# Patient Record
Sex: Male | Born: 1985 | Race: Black or African American | Hispanic: No | Marital: Married | Smoking: Current every day smoker
Health system: Southern US, Community
[De-identification: ages and names within clinical notes are randomized; demographics above are authoritative.]

## PROBLEM LIST (undated history)

## (undated) DIAGNOSIS — N400 Enlarged prostate without lower urinary tract symptoms: Secondary | ICD-10-CM

## (undated) DIAGNOSIS — K259 Gastric ulcer, unspecified as acute or chronic, without hemorrhage or perforation: Secondary | ICD-10-CM

## (undated) DIAGNOSIS — J40 Bronchitis, not specified as acute or chronic: Secondary | ICD-10-CM

## (undated) DIAGNOSIS — K219 Gastro-esophageal reflux disease without esophagitis: Secondary | ICD-10-CM

## (undated) DIAGNOSIS — R011 Cardiac murmur, unspecified: Secondary | ICD-10-CM

## (undated) HISTORY — DX: Benign prostatic hyperplasia without lower urinary tract symptoms: N40.0

## (undated) HISTORY — PX: COLONOSCOPY: SHX174

## (undated) HISTORY — PX: NO PAST SURGERIES: SHX2092

---
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
## 2006-11-28 ENCOUNTER — Emergency Department (HOSPITAL_COMMUNITY): Admission: EM | Admit: 2006-11-28 | Discharge: 2006-11-29 | Payer: Self-pay | Admitting: Emergency Medicine

## 2008-02-09 ENCOUNTER — Emergency Department (HOSPITAL_COMMUNITY): Admission: EM | Admit: 2008-02-09 | Discharge: 2008-02-09 | Payer: Self-pay | Admitting: Emergency Medicine

## 2009-06-21 ENCOUNTER — Emergency Department (HOSPITAL_COMMUNITY): Admission: EM | Admit: 2009-06-21 | Discharge: 2009-06-21 | Payer: Self-pay | Admitting: Emergency Medicine

## 2009-10-10 ENCOUNTER — Emergency Department (HOSPITAL_COMMUNITY): Admission: EM | Admit: 2009-10-10 | Discharge: 2009-10-10 | Payer: Self-pay | Admitting: Family Medicine

## 2009-10-10 ENCOUNTER — Emergency Department (HOSPITAL_COMMUNITY): Admission: EM | Admit: 2009-10-10 | Discharge: 2009-10-10 | Payer: Self-pay | Admitting: Emergency Medicine

## 2010-07-29 LAB — URINALYSIS, ROUTINE W REFLEX MICROSCOPIC
Glucose, UA: NEGATIVE mg/dL
Hgb urine dipstick: NEGATIVE
Ketones, ur: 15 mg/dL — AB
Nitrite: NEGATIVE
Protein, ur: NEGATIVE mg/dL
Specific Gravity, Urine: 1.023 (ref 1.005–1.030)
Urobilinogen, UA: 0.2 mg/dL (ref 0.0–1.0)
pH: 5 (ref 5.0–8.0)

## 2010-07-29 LAB — COMPREHENSIVE METABOLIC PANEL
ALT: 29 U/L (ref 0–53)
AST: 61 U/L — ABNORMAL HIGH (ref 0–37)
Albumin: 4.6 g/dL (ref 3.5–5.2)
Alkaline Phosphatase: 89 U/L (ref 39–117)
BUN: 7 mg/dL (ref 6–23)
CO2: 28 mEq/L (ref 19–32)
Calcium: 9.7 mg/dL (ref 8.4–10.5)
Chloride: 104 mEq/L (ref 96–112)
Creatinine, Ser: 1.2 mg/dL (ref 0.4–1.5)
GFR calc Af Amer: >60 mL/min (ref >60)
GFR calc non Af Amer: >60 mL/min (ref >60)
Glucose, Bld: 82 mg/dL (ref 70–99)
Potassium: 3.7 mEq/L (ref 3.5–5.1)
Sodium: 140 mEq/L (ref 135–145)
Total Bilirubin: 1.5 mg/dL — ABNORMAL HIGH (ref 0.3–1.2)
Total Protein: 7.9 g/dL (ref 6.0–8.3)

## 2010-07-29 LAB — URINE MICROSCOPIC-ADD ON

## 2010-07-29 LAB — CBC
HCT: 45.5 % (ref 39.0–52.0)
Hemoglobin: 15.6 g/dL (ref 13.0–17.0)
MCHC: 34.4 g/dL (ref 30.0–36.0)
MCV: 95.4 fL (ref 78.0–100.0)
Platelets: 264 10*3/uL (ref 150–400)
RBC: 4.77 MIL/uL (ref 4.22–5.81)
RDW: 14.5 % (ref 11.5–15.5)
WBC: 10 10*3/uL (ref 4.0–10.5)

## 2010-07-29 LAB — LIPASE, BLOOD: Lipase: 20 U/L (ref 11–59)

## 2011-04-07 ENCOUNTER — Emergency Department (HOSPITAL_COMMUNITY)
Admission: EM | Admit: 2011-04-07 | Discharge: 2011-04-07 | Disposition: A | Discharge status: Home / Self Care | Payer: Self-pay

## 2011-04-07 NOTE — ED Notes (Signed)
Pain, cramping in bilat legs with "black spots"

## 2011-10-14 ENCOUNTER — Encounter (HOSPITAL_COMMUNITY): Payer: Self-pay | Admitting: *Deleted

## 2011-10-14 ENCOUNTER — Emergency Department (HOSPITAL_COMMUNITY): Payer: Self-pay

## 2011-10-14 ENCOUNTER — Emergency Department (HOSPITAL_COMMUNITY)
Admission: EM | Admit: 2011-10-14 | Discharge: 2011-10-15 | Disposition: A | Discharge status: Home / Self Care | Payer: Self-pay | Attending: Emergency Medicine | Admitting: Emergency Medicine

## 2011-10-14 DIAGNOSIS — M79609 Pain in unspecified limb: Secondary | ICD-10-CM | POA: Insufficient documentation

## 2011-10-14 DIAGNOSIS — M79641 Pain in right hand: Secondary | ICD-10-CM

## 2011-10-14 DIAGNOSIS — Z1881 Retained glass fragments: Secondary | ICD-10-CM | POA: Insufficient documentation

## 2011-10-14 DIAGNOSIS — F17200 Nicotine dependence, unspecified, uncomplicated: Secondary | ICD-10-CM | POA: Insufficient documentation

## 2011-10-14 DIAGNOSIS — Z189 Retained foreign body fragments, unspecified material: Secondary | ICD-10-CM

## 2011-10-14 DIAGNOSIS — M795 Residual foreign body in soft tissue: Secondary | ICD-10-CM | POA: Insufficient documentation

## 2011-10-14 NOTE — ED Notes (Signed)
The pt thinks he may have glass in his rt hand from an accident 2010.  He has knots across his knuckles

## 2011-10-15 ENCOUNTER — Encounter (HOSPITAL_COMMUNITY): Payer: Self-pay | Admitting: Pharmacy Technician

## 2011-10-15 MED ORDER — IBUPROFEN 800 MG PO TABS
800.0000 mg | ORAL_TABLET | Freq: Once | ORAL | Status: AC
  Administered 2011-10-15: 800 mg via ORAL
  Filled 2011-10-15: qty 1

## 2011-10-15 MED ORDER — HYDROCODONE-ACETAMINOPHEN 5-500 MG PO TABS: 1.0000 | ORAL_TABLET | Freq: Four times a day (QID) | ORAL | Status: AC | PRN

## 2011-10-15 MED ORDER — HYDROCODONE-ACETAMINOPHEN 5-325 MG PO TABS
1.0000 | ORAL_TABLET | Freq: Once | ORAL | Status: AC
  Administered 2011-10-15: 1 via ORAL
  Filled 2011-10-15: qty 1

## 2011-10-15 MED ORDER — IBUPROFEN 800 MG PO TABS: 800.0000 mg | ORAL_TABLET | Freq: Three times a day (TID) | ORAL | Status: AC

## 2011-10-15 NOTE — ED Provider Notes (Signed)
History     CSN: [id number]  Arrival date & time 10/14/11  2256   First MD Initiated Contact with Patient 10/15/11 [phone]      Chief Complaint  Patient presents with  . glass in hand from 2010     (Consider location/radiation/quality/duration/timing/severity/associated sxs/prior treatment) HPI 26 yo male presents to the ER c/o pain to 2nd MCP joint and medial 5th MCP joint, concerned about retained glass.  Pt punched a window in 2011, at that time was told he most likely had retained glass and told to f/u with hand surgery.  Pt reports over the last few days he has had increasing pain when making a fist.  He has not followed up with hand.  No drainage, no redness, no swelling.   History reviewed. No pertinent past medical history.  History reviewed. No pertinent past surgical history.  No family history on file.  History  Substance Use Topics  . Smoking status: Current Everyday Smoker  . Smokeless tobacco: Not on file  . Alcohol Use: Yes      Review of Systems  All other systems reviewed and are negative.    Allergies  Review of patient's allergies indicates no known allergies.  Home Medications  No current outpatient prescriptions on file.  BP 121/84  Pulse 74  Temp(Src) 98.2 F (36.8 C) (Oral)  Resp 16  SpO2 100%  Physical Exam  Nursing note and vitals reviewed. Musculoskeletal: Normal range of motion. He exhibits tenderness (tenderness over 2nd mcp and interspace of 4th/5th fingers).       Mild effusion over 2nd (middle) MCP joint.  Scarring noted to interspace of 4th/5th fingers, do not appreciate a fob.    ED Course  Procedures (including critical care time)  Labs Reviewed - No data to display Dg Hand Complete Right  10/14/2011  *RADIOLOGY REPORT*  Clinical Data: Pain.  Laceration.  RIGHT HAND - COMPLETE 3+ VIEW  Comparison: 06/21/2009.  Findings: The joint spaces are maintained.  No acute fracture. There are small radiopaque foreign bodies noted in the  webspace between the fourth and fifth proximal phalanges. These are likely glass fragments. No change since prior study.  IMPRESSION:  1.  Small radiopaque foreign bodies between the fourth and fifth proximal phalanges. 2.  No acute fracture.  Original Report Authenticated By: P. Loralie Champagne, M.D.     No diagnosis found.    MDM  Will treat pain, have patient f/u with ortho/hand.  Given the length of time glass has been in place, expect it to have encapsulated scar tissue, and would be difficult to excise in the ED.        Olivia Mackie, MD 10/15/11 [phone]

## 2011-10-15 NOTE — Discharge Instructions (Signed)
Please take medications as prescribed.  Follow up with the hand surgeon for further workup of your hand pain and the retained glass.

## 2011-10-15 NOTE — ED Notes (Signed)
Received pt. From triage, pt. Alert and oriented, ambulatory, c/o right hand injury in 2010, pt. Reports glass in hand, increased pain last several days

## 2011-10-16 ENCOUNTER — Other Ambulatory Visit: Payer: Self-pay | Admitting: Orthopedic Surgery

## 2011-10-17 ENCOUNTER — Encounter (HOSPITAL_COMMUNITY): Payer: Self-pay | Admitting: *Deleted

## 2011-10-17 MED ORDER — CEFAZOLIN SODIUM 1-5 GM-% IV SOLN
1.0000 g | INTRAVENOUS | Status: AC
  Administered 2011-10-18: 1 g via INTRAVENOUS
  Filled 2011-10-17: qty 50

## 2011-10-18 ENCOUNTER — Encounter (HOSPITAL_COMMUNITY)
Admission: RE | Disposition: A | Discharge status: Home / Self Care | Payer: Self-pay | Source: Ambulatory Visit | Attending: Orthopedic Surgery

## 2011-10-18 ENCOUNTER — Encounter (HOSPITAL_COMMUNITY): Payer: Self-pay | Admitting: *Deleted

## 2011-10-18 ENCOUNTER — Encounter (HOSPITAL_COMMUNITY): Payer: Self-pay | Admitting: Critical Care Medicine

## 2011-10-18 ENCOUNTER — Ambulatory Visit (HOSPITAL_COMMUNITY): Payer: Self-pay | Admitting: Critical Care Medicine

## 2011-10-18 ENCOUNTER — Ambulatory Visit (HOSPITAL_COMMUNITY)
Admission: RE | Admit: 2011-10-18 | Discharge: 2011-10-18 | Disposition: A | Discharge status: Home / Self Care | Payer: Self-pay | Source: Ambulatory Visit | Attending: Orthopedic Surgery | Admitting: Orthopedic Surgery

## 2011-10-18 DIAGNOSIS — M795 Residual foreign body in soft tissue: Secondary | ICD-10-CM | POA: Insufficient documentation

## 2011-10-18 DIAGNOSIS — Z1881 Retained glass fragments: Secondary | ICD-10-CM | POA: Insufficient documentation

## 2011-10-18 DIAGNOSIS — F17200 Nicotine dependence, unspecified, uncomplicated: Secondary | ICD-10-CM | POA: Insufficient documentation

## 2011-10-18 DIAGNOSIS — S60551A Superficial foreign body of right hand, initial encounter: Secondary | ICD-10-CM

## 2011-10-18 DIAGNOSIS — M65839 Other synovitis and tenosynovitis, unspecified forearm: Secondary | ICD-10-CM | POA: Insufficient documentation

## 2011-10-18 HISTORY — DX: Bronchitis, not specified as acute or chronic: J40

## 2011-10-18 HISTORY — PX: I & D EXTREMITY: SHX5045

## 2011-10-18 LAB — COMPREHENSIVE METABOLIC PANEL
ALT: 17 U/L (ref 0–53)
Albumin: 3.8 g/dL (ref 3.5–5.2)
Alkaline Phosphatase: 66 U/L (ref 39–117)
BUN: 13 mg/dL (ref 6–23)
Chloride: 102 mEq/L (ref 96–112)
Potassium: 4 mEq/L (ref 3.5–5.1)
Sodium: 138 mEq/L (ref 135–145)
Total Bilirubin: 0.5 mg/dL (ref 0.3–1.2)

## 2011-10-18 LAB — SURGICAL PCR SCREEN: Staphylococcus aureus: POSITIVE — AB

## 2011-10-18 LAB — CBC
HCT: 40.7 % (ref 39.0–52.0)
Hemoglobin: 14.2 g/dL (ref 13.0–17.0)
RDW: 13.4 % (ref 11.5–15.5)
WBC: 4.9 10*3/uL (ref 4.0–10.5)

## 2011-10-18 SURGERY — IRRIGATION AND DEBRIDEMENT EXTREMITY
Anesthesia: General | Site: Hand | Laterality: Right | Wound class: Contaminated

## 2011-10-18 MED ORDER — SODIUM CHLORIDE 0.45 % IV SOLN: INTRAVENOUS | Status: DC

## 2011-10-18 MED ORDER — CEPHALEXIN 500 MG PO CAPS: 500.0000 mg | ORAL_CAPSULE | Freq: Four times a day (QID) | ORAL | Status: AC

## 2011-10-18 MED ORDER — HYDROMORPHONE HCL PF 1 MG/ML IJ SOLN: 0.2500 mg | INTRAMUSCULAR | Status: DC | PRN

## 2011-10-18 MED ORDER — MIDAZOLAM HCL 5 MG/5ML IJ SOLN
INTRAMUSCULAR | Status: DC | PRN
  Administered 2011-10-18: 2 mg via INTRAVENOUS

## 2011-10-18 MED ORDER — LACTATED RINGERS IV SOLN
INTRAVENOUS | Status: DC | PRN
  Administered 2011-10-18: 08:00:00 via INTRAVENOUS

## 2011-10-18 MED ORDER — SODIUM CHLORIDE 0.9 % IR SOLN
Status: DC | PRN
  Administered 2011-10-18: 1000 mL

## 2011-10-18 MED ORDER — SUFENTANIL CITRATE 50 MCG/ML IV SOLN
INTRAVENOUS | Status: DC | PRN
  Administered 2011-10-18: 10 ug via INTRAVENOUS

## 2011-10-18 MED ORDER — BUPIVACAINE HCL (PF) 0.25 % IJ SOLN
INTRAMUSCULAR | Status: DC | PRN
  Administered 2011-10-18: 8 mL

## 2011-10-18 MED ORDER — ONDANSETRON HCL 4 MG/2ML IJ SOLN
INTRAMUSCULAR | Status: AC
  Filled 2011-10-18: qty 2

## 2011-10-18 MED ORDER — GLYCOPYRROLATE 0.2 MG/ML IJ SOLN
INTRAMUSCULAR | Status: DC | PRN
  Administered 2011-10-18: 0.2 mg via INTRAVENOUS

## 2011-10-18 MED ORDER — MUPIROCIN 2 % EX OINT
TOPICAL_OINTMENT | Freq: Two times a day (BID) | CUTANEOUS | Status: DC
  Filled 2011-10-18 (×2): qty 22

## 2011-10-18 MED ORDER — LIDOCAINE HCL (CARDIAC) 20 MG/ML IV SOLN
INTRAVENOUS | Status: DC | PRN
  Administered 2011-10-18: 100 mg via INTRAVENOUS

## 2011-10-18 MED ORDER — DROPERIDOL 2.5 MG/ML IJ SOLN
INTRAMUSCULAR | Status: AC
  Filled 2011-10-18: qty 2

## 2011-10-18 MED ORDER — CHLORHEXIDINE GLUCONATE 4 % EX LIQD: 60.0000 mL | Freq: Once | CUTANEOUS | Status: DC

## 2011-10-18 MED ORDER — PROPOFOL 10 MG/ML IV EMUL
INTRAVENOUS | Status: DC | PRN
  Administered 2011-10-18: 100 mg via INTRAVENOUS

## 2011-10-18 MED ORDER — DROPERIDOL 2.5 MG/ML IJ SOLN
0.6250 mg | INTRAMUSCULAR | Status: DC | PRN
  Administered 2011-10-18: 0.625 mg via INTRAVENOUS

## 2011-10-18 SURGICAL SUPPLY — 45 items
BANDAGE CONFORM 2  STR LF (GAUZE/BANDAGES/DRESSINGS) IMPLANT
BANDAGE ELASTIC 3 VELCRO ST LF (GAUZE/BANDAGES/DRESSINGS) ×2 IMPLANT
BANDAGE ELASTIC 4 VELCRO ST LF (GAUZE/BANDAGES/DRESSINGS) ×2 IMPLANT
BANDAGE GAUZE ELAST BULKY 4 IN (GAUZE/BANDAGES/DRESSINGS) ×2 IMPLANT
CLOTH BEACON ORANGE TIMEOUT ST (SAFETY) ×2 IMPLANT
CORDS BIPOLAR (ELECTRODE) ×2 IMPLANT
CUFF TOURNIQUET SINGLE 18IN (TOURNIQUET CUFF) ×2 IMPLANT
CUFF TOURNIQUET SINGLE 24IN (TOURNIQUET CUFF) IMPLANT
CUFF TOURNIQUET SINGLE 34IN LL (TOURNIQUET CUFF) IMPLANT
CUFF TOURNIQUET SINGLE 44IN (TOURNIQUET CUFF) IMPLANT
DRSG ADAPTIC 3X8 NADH LF (GAUZE/BANDAGES/DRESSINGS) ×2 IMPLANT
DRSG EMULSION OIL 3X3 NADH (GAUZE/BANDAGES/DRESSINGS) ×2 IMPLANT
ELECT REM PT RETURN 9FT ADLT (ELECTROSURGICAL)
ELECTRODE REM PT RTRN 9FT ADLT (ELECTROSURGICAL) IMPLANT
GAUZE XEROFORM 1X8 LF (GAUZE/BANDAGES/DRESSINGS) ×2 IMPLANT
GLOVE BIOGEL M STRL SZ7.5 (GLOVE) ×2 IMPLANT
GLOVE SS BIOGEL STRL SZ 8 (GLOVE) ×1 IMPLANT
GLOVE SUPERSENSE BIOGEL SZ 8 (GLOVE) ×1
GOWN PREVENTION PLUS XLARGE (GOWN DISPOSABLE) ×2 IMPLANT
GOWN STRL NON-REIN LRG LVL3 (GOWN DISPOSABLE) ×4 IMPLANT
GOWN STRL REIN XL XLG (GOWN DISPOSABLE) ×4 IMPLANT
HANDPIECE INTERPULSE COAX TIP (DISPOSABLE)
KIT BASIN OR (CUSTOM PROCEDURE TRAY) ×2 IMPLANT
KIT ROOM TURNOVER OR (KITS) ×2 IMPLANT
MANIFOLD NEPTUNE II (INSTRUMENTS) ×2 IMPLANT
NEEDLE HYPO 25GX1X1/2 BEV (NEEDLE) ×2 IMPLANT
NS IRRIG 1000ML POUR BTL (IV SOLUTION) ×2 IMPLANT
PACK ORTHO EXTREMITY (CUSTOM PROCEDURE TRAY) ×2 IMPLANT
PAD ARMBOARD 7.5X6 YLW CONV (MISCELLANEOUS) ×4 IMPLANT
PAD CAST 3X4 CTTN HI CHSV (CAST SUPPLIES) ×1 IMPLANT
PAD CAST 4YDX4 CTTN HI CHSV (CAST SUPPLIES) ×1 IMPLANT
PADDING CAST COTTON 3X4 STRL (CAST SUPPLIES) ×1
PADDING CAST COTTON 4X4 STRL (CAST SUPPLIES) ×1
SET HNDPC FAN SPRY TIP SCT (DISPOSABLE) IMPLANT
SPLINT FIBERGLASS 3X35 (CAST SUPPLIES) ×2 IMPLANT
SPONGE GAUZE 4X4 12PLY (GAUZE/BANDAGES/DRESSINGS) ×2 IMPLANT
SPONGE LAP 18X18 X RAY DECT (DISPOSABLE) ×2 IMPLANT
SPONGE LAP 4X18 X RAY DECT (DISPOSABLE) ×2 IMPLANT
SYR CONTROL 10ML LL (SYRINGE) ×2 IMPLANT
TOWEL OR 17X24 6PK STRL BLUE (TOWEL DISPOSABLE) ×2 IMPLANT
TOWEL OR 17X26 10 PK STRL BLUE (TOWEL DISPOSABLE) ×2 IMPLANT
TUBE ANAEROBIC SPECIMEN COL (MISCELLANEOUS) IMPLANT
TUBE CONNECTING 12X1/4 (SUCTIONS) ×2 IMPLANT
WATER STERILE IRR 1000ML POUR (IV SOLUTION) IMPLANT
YANKAUER SUCT BULB TIP NO VENT (SUCTIONS) ×2 IMPLANT

## 2011-10-18 NOTE — Anesthesia Preprocedure Evaluation (Addendum)
Anesthesia Evaluation  Patient identified by MRN, date of birth, ID band Patient awake    Reviewed: Allergy & Precautions, H&P , NPO status , Patient's Chart, lab work & pertinent test results  Airway Mallampati: I TM Distance: >3 FB Neck ROM: full    Dental  (+) Dental Advisory Given and Teeth Intact   Pulmonary Current Smoker,  Hx bronchitis breath sounds clear to auscultation  Pulmonary exam normal       Cardiovascular     Neuro/Psych    GI/Hepatic negative GI ROS, Neg liver ROS,   Endo/Other  negative endocrine ROS  Renal/GU negative Renal ROS     Musculoskeletal   Abdominal Normal abdominal exam  (+)   Peds  Hematology negative hematology ROS (+)   Anesthesia Other Findings   Reproductive/Obstetrics                          Anesthesia Physical Anesthesia Plan  ASA: II  Anesthesia Plan: General   Post-op Pain Management:    Induction: Intravenous  Airway Management Planned: LMA  Additional Equipment:   Intra-op Plan:   Post-operative Plan:   Informed Consent: I have reviewed the patients History and Physical, chart, labs and discussed the procedure including the risks, benefits and alternatives for the proposed anesthesia with the patient or authorized representative who has indicated his/her understanding and acceptance.   Dental Advisory Given  Plan Discussed with: Anesthesiologist, CRNA and Surgeon  Anesthesia Plan Comments:       Anesthesia Quick Evaluation

## 2011-10-18 NOTE — Transfer of Care (Signed)
Immediate Anesthesia Transfer of Care Note  Patient: Gabriel Dalton  Procedure(s) Performed: Procedure(s) (LRB): IRRIGATION AND DEBRIDEMENT EXTREMITY (Right)  Patient Location: PACU  Anesthesia Type: General  Level of Consciousness: awake, alert , oriented, sedated, patient cooperative and responds to stimulation  Airway & Oxygen Therapy: Patient Spontanous Breathing and Patient connected to nasal cannula oxygen  Post-op Assessment: Report given to PACU RN, Post -op Vital signs reviewed and stable and Patient moving all extremities  Post vital signs: Reviewed and stable  Complications: No apparent anesthesia complications

## 2011-10-18 NOTE — Discharge Instructions (Signed)
We recommend that you to take vitamin C 1000 mg a day to promote healing we also recommend that if you require her pain medicine that he take a stool softener to prevent constipation as most pain medicines will have constipation side effects. We recommend either Peri-Colace or Senokot and recommend that you also consider adding MiraLAX to prevent the constipation affects from pain medicine if you are required to use them. These medicines are over the counter and maybe purchased at a local pharmacy.  Keep bandage clean and dry.  Call for any problems.  No smoking.  Criteria for driving a car: you should be off your pain medicine for 7-8 hours, able to drive one handed(confident), thinking clearly and feeling able in your judgement to drive. Continue elevation as it will decrease swelling.  If instructed by MD move your fingers within the confines of the bandage/splint.  Use ice if instructed by your MD. Call immediately for any sudden loss of feeling in your hand/arm or change in functional abilities of the extremity.    Take antibiotics until complete  Use tylenol or Ibuprofen for pain as necessary  Instructions Following General Anesthetic, Adult A nurse specialized in giving anesthesia (anesthetist) or a doctor specialized in giving anesthesia (anesthesiologist) gave you a medicine that made you sleep while a procedure was performed. For as long as 24 hours following this procedure, you may feel:  Dizzy.   Weak.   Drowsy.  AFTER THE PROCEDURE After surgery, you will be taken to the recovery area where a nurse will monitor your progress. You will be allowed to go home when you are awake, stable, taking fluids well, and without complications. For the first 24 hours following an anesthetic:  Have a responsible person with you.   Do not drive a car. If you are alone, do not take public transportation.   Do not drink alcohol.   Do not take medicine that has not been prescribed by your  caregiver.   Do not sign important papers or make important decisions.   You may resume normal diet and activities as directed.   Change bandages (dressings) as directed.   Only take over-the-counter or prescription medicines for pain, discomfort, or fever as directed by your caregiver.  If you have questions or problems that seem related to the anesthetic, call the hospital and ask for the anesthetist or anesthesiologist on call. SEEK IMMEDIATE MEDICAL CARE IF:   You develop a rash.   You have difficulty breathing.   You have chest pain.   You develop any allergic problems.  Document Released: 08/04/2000 Document Revised: 04/17/2011 Document Reviewed: 03/15/2007 [facility] Patient Information 2012 [place], [place].

## 2011-10-18 NOTE — Addendum Note (Signed)
Addendum  created 10/18/11 1041 by Theodosia Quay, CRNA   Modules edited:Anesthesia Responsible Staff

## 2011-10-18 NOTE — Preoperative (Signed)
Beta Blockers   Reason not to administer Beta Blockers:Not Applicable 

## 2011-10-18 NOTE — Addendum Note (Signed)
Addendum  created 10/18/11 1044 by Theodosia Quay, CRNA   Modules edited:Anesthesia Events

## 2011-10-18 NOTE — H&P (Signed)
Gabriel Dalton is an 26 y.o. male.   Chief Complaint:Patients presents for I&D and FB removal HPI: Gabriel KitchenMarland KitchenPatient presents for evaluation and treatment of the of their upper extremity predicament. The patient denies neck back chest or of abdominal pain. The patient notes that they have no lower extremity problems. The patient from primarily complains of the upper extremity pain noted.  Past Medical History  Diagnosis Date  . Bronchitis     Past Surgical History  Procedure Date  . No past surgeries     No family history on file. Social History:  reports that he has been smoking.  He does not have any smokeless tobacco history on file. He reports that he drinks about 3.6 ounces of alcohol per week. He reports that he uses illicit drugs (Marijuana).  Allergies: No Known Allergies  Medications Prior to Admission  Medication Sig Dispense Refill  . HYDROcodone-acetaminophen (VICODIN) 5-500 MG per tablet Take 1-2 tablets by mouth every 6 (six) hours as needed for pain.  15 tablet  0  . ibuprofen (ADVIL,MOTRIN) 800 MG tablet Take 1 tablet (800 mg total) by mouth 3 (three) times daily.  21 tablet  0    No results found for this or any previous visit (from the past 48 hour(s)). No results found.  Review of Systems  Eyes: Negative.   Respiratory: Negative.   Genitourinary: Negative.   Skin: Negative.   Neurological: Negative.   Endo/Heme/Allergies: Negative.   Psychiatric/Behavioral: Negative.     Blood pressure 137/75, pulse 65, temperature 98.3 F (36.8 C), temperature source Oral, resp. rate 18, height 5\' 9"  (1.753 m), weight 63.504 kg (140 lb), SpO2 100.00%. Physical Exam ..The patient is alert and oriented in no acute distress the patient complains of pain in the affected upper extremity. The patient is noted to have a normal HEENT exam. Lung fields show equal chest expansion and no shortness of breath abdomen exam is nontender without distention. Lower extremity examination does  not show any fracture dislocation or blood clot symptoms. Pelvis is stable neck and back are stable and nontender  Patient with painful and retained FB right middle finger   Assessment/Plan .Gabriel KitchenWe are planning surgery for your upper extremity. The risk and benefits of surgery include risk of bleeding infection anesthesia damage to normal structures and failure of the surgery to accomplish its intended goals of relieving symptoms and restoring function with this in mind we'll going to proceed. I have specifically discussed with the patient the pre-and postoperative regime and the does and don'ts and risk and benefits in great detail. Risk and benefits of surgery also include risk of dystrophy chronic nerve pain failure of the healing process to go onto completion and other inherent risks of surgery The relavent the pathophysiology of the disease/injury process, as well as the alternatives for treatment and postoperative course of action has been discussed in great detail with the patient who desires to proceed.  We will do everything in our power to help you (the patient) restore function to the upper extremity. Is a pleasure to see this patient today.   Gabriel Dalton III,Gabriel Dalton M 10/18/2011, 8:30 AM

## 2011-10-18 NOTE — Op Note (Signed)
NAMEJEWELL, RYANS NO.:  [id number]  MEDICAL RECORD NO.:  [id number]  LOCATION:  [facility]                         FACILITY:  [facility]  PHYSICIAN:  Dionne Ano. 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  PROCEDURE: 1. Removal of deep foreign body complicated in nature right middle     finger MCP joint and hand. 2. Extensor tendon tenolysis, tenosynovectomy, and exploration. 3. Stress radiography.  SURGEON:  Dionne Ano. Amanda Pea, M.D.  ASSISTANT:  Karie Chimera, who was instrumental in retraction and tissue handling during the case.  TOURNIQUET TIME:  Less than an hour.  INDICATIONS FOR THE PROCEDURE:  This 27 year old who presented upon referral from the emergency room in regards to his upper extremity predicament.  I discussed with the patient the risks and benefits of surgery and the options for his hand.  After instruction, he desired to proceed with surgical intervention in the form of the above-mentioned operative procedure.  OPERATION IN DETAIL:  The patient was seen by myself and Anesthesia, taken to the operative suite, underwent smooth induction of general anesthesia, laid supine, appropriately padded, prepped and draped in usual sterile fashion with Betadine scrub and paint about the right upper extremity.  Once this done, right middle finger MCP joint underwent incision.  Dissection was then carried down, and large foreign body was removed.  The foreign body was removed without difficulty. This was a deep complicated foreign body in the form of a glass shard. At this time, I performed a stress radiography revealing no evidence  of retained foreign body and this verified the complete removal of the abnormality.  Once this was done, we then performed a very careful tenolysis, tenosynovectomy of the extensor apparatus.  The patient had a marked amount of adhesions and degenerative tissue.  In detail, there was a partial tear to the tendon, but there was not complete incompetence of the tendon.  Thus, at this time, I performed a very meticulous tenolysis, tenosynovectomy of the extensor apparatus about the hand and middle finger and then irrigated copiously.  The tourniquet was deflated.  Hemostasis secured.  The wound was closed with Prolene. He was placed in a sterile dressing and volar plaster splint, and returned to see [place] in 10-14 days in the office for followup and notify should any problems occur.  At that time of first postop visit, suture will be removed, active range of motion begun, and we will move him aggressively in terms of activities as tolerated with progression to full range of motion, sponge bath use etc.  These notes have been discussed, and all questions have been encouraged and answered.     Dionne Ano. Amanda Pea, M.D.     [facility]  D:  10/18/2011  T:  10/18/2011  Job:  [id number]

## 2011-10-18 NOTE — Anesthesia Postprocedure Evaluation (Signed)
Anesthesia Post Note  Patient: Gabriel Dalton  Procedure(s) Performed: Procedure(s) (LRB): IRRIGATION AND DEBRIDEMENT EXTREMITY (Right)  Anesthesia type: general  Patient location: PACU  Post pain: Pain level controlled  Post assessment: Patient's Cardiovascular Status Stable  Last Vitals:  Filed Vitals:   10/18/11 0938  BP:   Pulse: 49  Temp:   Resp: 11    Post vital signs: Reviewed and stable  Level of consciousness: sedated  Complications: No apparent anesthesia complications

## 2011-10-18 NOTE — Op Note (Signed)
See full dictation [id number] dict # Dominica Severin MD

## 2011-10-20 ENCOUNTER — Encounter (HOSPITAL_COMMUNITY): Payer: Self-pay | Admitting: Orthopedic Surgery

## 2011-10-20 MED FILL — Mupirocin Oint 2%: CUTANEOUS | Qty: 22 | Status: AC

## 2011-10-22 NOTE — Addendum Note (Signed)
Addendum  created 10/22/11 1435 by Cheick Suhr Daniel Summit Borchardt, MD   Modules edited:Charting, Inpatient Notes    

## 2011-10-22 NOTE — Addendum Note (Signed)
Addendum  created 10/22/11 1435 by Remonia Richter, MD   Modules edited:Charting, Inpatient Notes

## 2012-07-18 ENCOUNTER — Emergency Department (INDEPENDENT_AMBULATORY_CARE_PROVIDER_SITE_OTHER)
Admission: EM | Admit: 2012-07-18 | Discharge: 2012-07-18 | Disposition: A | Discharge status: Home / Self Care | Payer: BC Managed Care – PPO | Source: Home / Self Care

## 2012-07-18 ENCOUNTER — Encounter (HOSPITAL_COMMUNITY): Payer: Self-pay | Admitting: *Deleted

## 2012-07-18 DIAGNOSIS — J208 Acute bronchitis due to other specified organisms: Secondary | ICD-10-CM

## 2012-07-18 DIAGNOSIS — R0789 Other chest pain: Secondary | ICD-10-CM

## 2012-07-18 MED ORDER — IBUPROFEN 800 MG PO TABS
800.0000 mg | ORAL_TABLET | Freq: Once | ORAL | Status: AC
  Administered 2012-07-18: 800 mg via ORAL

## 2012-07-18 MED ORDER — IBUPROFEN 800 MG PO TABS
ORAL_TABLET | ORAL | Status: AC
  Filled 2012-07-18: qty 1

## 2012-07-18 MED ORDER — IBUPROFEN 200 MG PO TABS: 400.0000 mg | ORAL_TABLET | Freq: Four times a day (QID) | ORAL | Status: DC | PRN

## 2012-07-18 NOTE — ED Notes (Signed)
Patient complains of chest pain, dizziness, and shortness of breath that started last night. States nausea, and heart palpitations. Patient states he has several headaches a day. Denies fever/chills, vomiting, diarrhea.

## 2012-07-18 NOTE — ED Provider Notes (Signed)
History     CSN: [id number]  Arrival date & time 07/18/12  1139   First MD Initiated Contact with Patient 07/18/12 1143      Chief Complaint  Patient presents with  . Chest Pain  . Shortness of Breath    (Consider location/radiation/quality/duration/timing/severity/associated sxs/prior treatment) Patient is a 27 y.o. male presenting with chest pain and shortness of breath.  Chest Pain Associated symptoms: cough, dizziness, fatigue, headache, nausea and shortness of breath   Associated symptoms: no diaphoresis, no fever and no palpitations   Shortness of Breath Associated symptoms: chest pain, cough and headaches   Associated symptoms: no diaphoresis, no fever and no wheezing    This is a 27 year old male who comes in with a complaint of left-sided chest pain which started last night. He states it is worse when he takes a deep breath or when he lays a certain way in the bed. He does admit to having a cough which is nonproductive for the past few days. He denies watery eyes runny nose sore throat and earache but is noted to be blowing his nose after I left the room. He denies fevers. He is nauseated but has not had any vomiting he's had poor by mouth intake and has been dizzy today. He has been using his pro air inhaler to help control the chest pain however states that he has not been wheezing.  Past Medical History  Diagnosis Date  . Bronchitis     Past Surgical History  Procedure Laterality Date  . No past surgeries    . I&d extremity  10/18/2011    Procedure: IRRIGATION AND DEBRIDEMENT EXTREMITY;  Surgeon: Dominica Severin, MD;  Location[facility] OR;  Service: Orthopedics;  Laterality: Right;  Irrigation and Debridement  Right Middle Finger; Removal of foreign body    No family history on file.  History  Substance Use Topics  . Smoking status: Current Every Day Smoker -- 0.50 packs/day  . Smokeless tobacco: Not on file  . Alcohol Use: 3.6 oz/week    6 Cans of beer per week       Review of Systems  Constitutional: Positive for activity change, appetite change and fatigue. Negative for fever, chills, diaphoresis and unexpected weight change.  Respiratory: Positive for cough and shortness of breath. Negative for apnea, choking, chest tightness, wheezing and stridor.   Cardiovascular: Positive for chest pain. Negative for palpitations and leg swelling.  Gastrointestinal: Positive for nausea.  Genitourinary: Positive for decreased urine volume.  Musculoskeletal: Negative.   Skin: Negative.   Neurological: Positive for dizziness and headaches.  Psychiatric/Behavioral: Negative.     Allergies  Review of patient's allergies indicates no known allergies.  Home Medications   Current Outpatient Rx  Name  Route  Sig  Dispense  Refill  . ibuprofen (MOTRIN IB) 200 MG tablet   Oral   Take 2-3 tablets (400-600 mg total) by mouth every 6 (six) hours as needed for pain.   30 tablet   0     BP 146/88  Pulse 107  Temp(Src) 97.9 F (36.6 C) (Oral)  Resp 21  SpO2 95%  Physical Exam  Constitutional: He appears well-developed and well-nourished.  HENT:  Head: Normocephalic and atraumatic.  Eyes: Conjunctivae are normal. Pupils are equal, round, and reactive to light.  Neck: Normal range of motion. Neck supple.  Cardiovascular: Normal rate and regular rhythm.   Pulmonary/Chest: Effort normal and breath sounds normal. No respiratory distress. He has no wheezes. He has no rales.  Abdominal: Soft. Bowel sounds are normal.  Musculoskeletal:       Arms: Chest wall tenderness noted.    ED Course  Procedures (including critical care time)  Labs Reviewed - No data to display No results found.   1. Viral bronchitis   2. Chest pain, muscular       MDM  Take Motrin as ordered for your pain along with warm compresses. Drink plenty of liquids and rest in bed today for your dizziness. Limit use of your inhaler unless you are wheezing or short of breath. Do  not use inhaler to try to control the chest pain.            Calvert Cantor, MD 07/18/12 1220

## 2014-11-21 ENCOUNTER — Encounter (HOSPITAL_COMMUNITY): Payer: Self-pay | Admitting: Emergency Medicine

## 2014-11-21 ENCOUNTER — Emergency Department (HOSPITAL_COMMUNITY)
Admission: EM | Admit: 2014-11-21 | Discharge: 2014-11-21 | Disposition: A | Discharge status: Home / Self Care | Payer: Self-pay | Attending: Emergency Medicine | Admitting: Emergency Medicine

## 2014-11-21 DIAGNOSIS — Z720 Tobacco use: Secondary | ICD-10-CM | POA: Insufficient documentation

## 2014-11-21 DIAGNOSIS — R202 Paresthesia of skin: Secondary | ICD-10-CM | POA: Insufficient documentation

## 2014-11-21 DIAGNOSIS — M79605 Pain in left leg: Secondary | ICD-10-CM | POA: Insufficient documentation

## 2014-11-21 DIAGNOSIS — R200 Anesthesia of skin: Secondary | ICD-10-CM | POA: Insufficient documentation

## 2014-11-21 DIAGNOSIS — Z8709 Personal history of other diseases of the respiratory system: Secondary | ICD-10-CM | POA: Insufficient documentation

## 2014-11-21 DIAGNOSIS — M79604 Pain in right leg: Secondary | ICD-10-CM | POA: Insufficient documentation

## 2014-11-21 LAB — CBC WITH DIFFERENTIAL/PLATELET
BASOS PCT: 0 % (ref 0–1)
Basophils Absolute: 0 10*3/uL (ref 0.0–0.1)
EOS ABS: 0.1 10*3/uL (ref 0.0–0.7)
Eosinophils Relative: 2 % (ref 0–5)
HEMATOCRIT: 43.1 % (ref 39.0–52.0)
HEMOGLOBIN: 15.1 g/dL (ref 13.0–17.0)
LYMPHS ABS: 3.5 10*3/uL (ref 0.7–4.0)
Lymphocytes Relative: 41 % (ref 12–46)
MCH: 30.4 pg (ref 26.0–34.0)
MCHC: 35 g/dL (ref 30.0–36.0)
MCV: 86.9 fL (ref 78.0–100.0)
Monocytes Absolute: 0.7 10*3/uL (ref 0.1–1.0)
Monocytes Relative: 8 % (ref 3–12)
NEUTROS PCT: 49 % (ref 43–77)
Neutro Abs: 4.2 10*3/uL (ref 1.7–7.7)
PLATELETS: 293 10*3/uL (ref 150–400)
RBC: 4.96 MIL/uL (ref 4.22–5.81)
RDW: 13.4 % (ref 11.5–15.5)
WBC: 8.6 10*3/uL (ref 4.0–10.5)

## 2014-11-21 LAB — BASIC METABOLIC PANEL
ANION GAP: 12 (ref 5–15)
BUN: 8 mg/dL (ref 6–20)
CALCIUM: 9.9 mg/dL (ref 8.9–10.3)
CO2: 25 mmol/L (ref 22–32)
Chloride: 101 mmol/L (ref 101–111)
Creatinine, Ser: 0.88 mg/dL (ref 0.61–1.24)
GFR calc non Af Amer: >60 mL/min (ref >60)
Glucose, Bld: 75 mg/dL (ref 65–99)
POTASSIUM: 3.6 mmol/L (ref 3.5–5.1)
Sodium: 138 mmol/L (ref 135–145)

## 2014-11-21 MED ORDER — HYDROCODONE-ACETAMINOPHEN 5-325 MG PO TABS
1.0000 | ORAL_TABLET | Freq: Once | ORAL | Status: AC
  Administered 2014-11-21: 1 via ORAL
  Filled 2014-11-21: qty 1

## 2014-11-21 MED ORDER — NEPHROCAPS 1 MG PO CAPS: 1.0000 | ORAL_CAPSULE | Freq: Every day | ORAL | Status: DC

## 2014-11-21 MED ORDER — HYDROCODONE-ACETAMINOPHEN 5-325 MG PO TABS: 1.0000 | ORAL_TABLET | Freq: Four times a day (QID) | ORAL | Status: DC | PRN

## 2014-11-21 MED ORDER — NAPROXEN 500 MG PO TABS: 500.0000 mg | ORAL_TABLET | Freq: Two times a day (BID) | ORAL | Status: DC | PRN

## 2014-11-21 NOTE — Discharge Instructions (Signed)
Your symptoms could be caused by vitamin deficiencies. Start taking the multivitamin as directed. It could also be due to a variety of other conditions, you will need to have ongoing management by a regular doctor. Follow up with [facility] in one week for ongoing evaluation. Use naprosyn and norco as directed as needed for pain, but don't drive or operate machinery while taking norco. If your symptoms are persisting, follow up with neurology for ongoing evaluation of any nerve issues. Return to the ER for changes or worsening symptoms.   Paresthesia Paresthesia is a burning or prickling feeling. This feeling can happen in any part of the body. It often happens in the hands, arms, legs, or feet. HOME CARE  Avoid drinking alcohol.  Try massage or needle therapy (acupuncture) to help with your problems.  Keep all doctor visits as told. GET HELP RIGHT AWAY IF:   You feel weak.  You have trouble walking or moving.  You have problems speaking or seeing.  You feel confused.  You cannot control when you poop (bowel movement) or pee (urinate).  You lose feeling (numbness) after an injury.  You pass out (faint).  Your burning or prickling feeling gets worse when you walk.  You have pain, cramps, or feel dizzy.  You have a rash. MAKE SURE YOU:   Understand these instructions.  Will watch your condition.  Will get help right away if you are not doing well or get worse. Document Released: 04/10/2008 Document Revised: 07/21/2011 Document Reviewed: 01/17/2011 [facility] Patient Information 2015 [place]. This information is not intended to replace advice given to you by your health care provider. Make sure you discuss any questions you have with your health care provider.  Neuropathic Pain We often think that pain has a physical cause. If we get rid of the cause, the pain should go away. Nerves themselves can also cause pain. It is called neuropathic pain, which means  nerve abnormality. It may be difficult for the patients who have it and for the treating caregivers. Pain is usually described as acute (short-lived) or chronic (long-lasting). Acute pain is related to the physical sensations caused by an injury. It can last from a few seconds to many weeks, but it usually goes away when normal healing occurs. Chronic pain lasts beyond the typical healing time. With neuropathic pain, the nerve fibers themselves may be damaged or injured. They then send incorrect signals to other pain centers. The pain you feel is real, but the cause is not easy to find.  CAUSES  Chronic pain can result from diseases, such as diabetes and shingles (an infection related to chickenpox), or from trauma, surgery, or amputation. It can also happen without any known injury or disease. The nerves are sending pain messages, even though there is no identifiable cause for such messages.   Other common causes of neuropathy include diabetes, phantom limb pain, or Regional Pain Syndrome (RPS).  As with all forms of chronic back pain, if neuropathy is not correctly treated, there can be a number of associated problems that lead to a downward cycle for the patient. These include depression, sleeplessness, feelings of fear and anxiety, limited social interaction and inability to do normal daily activities or work.  The most dramatic and mysterious example of neuropathic pain is called "phantom limb syndrome." This occurs when an arm or a leg has been removed because of illness or injury. The brain still gets pain messages from the nerves that originally carried  impulses from the missing limb. These nerves now seem to misfire and cause troubling pain.  Neuropathic pain often seems to have no cause. It responds poorly to standard pain treatment. Neuropathic pain can occur after:  Shingles (herpes zoster virus infection).  A lasting burning sensation of the skin, caused usually by injury to a peripheral  nerve.  Peripheral neuropathy which is widespread nerve damage, often caused by diabetes or alcoholism.  Phantom limb pain following an amputation.  Facial nerve problems (trigeminal neuralgia).  Multiple sclerosis.  Reflex sympathetic dystrophy.  Pain which comes with cancer and cancer chemotherapy.  Entrapment neuropathy such as when pressure is put on a nerve such as in carpal tunnel syndrome.  Back, leg, and hip problems (sciatica).  Spine or back surgery.  HIV Infection or AIDS where nerves are infected by viruses. Your caregiver can explain items in the above list which may apply to you. SYMPTOMS  Characteristics of neuropathic pain are:  Severe, sharp, electric shock-like, shooting, lightening-like, knife-like.  Pins and needles sensation.  Deep burning, deep cold, or deep ache.  Persistent numbness, tingling, or weakness.  Pain resulting from light touch or other stimulus that would not usually cause pain.  Increased sensitivity to something that would normally cause pain, such as a pinprick. Pain may persist for months or years following the healing of damaged tissues. When this happens, pain signals no longer sound an alarm about current injuries or injuries about to happen. Instead, the alarm system itself is not working correctly.  Neuropathic pain may get worse instead of better over time. For some people, it can lead to serious disability. It is important to be aware that severe injury in a limb can occur without a proper, protective pain response.Burns, cuts, and other injuries may go unnoticed. Without proper treatment, these injuries can become infected or lead to further disability. Take any injury seriously, and consult your caregiver for treatment. DIAGNOSIS  When you have a pain with no known cause, your caregiver will probably ask some specific questions:   Do you have any other conditions, such as diabetes, shingles, multiple sclerosis, or HIV  infection?  How would you describe your pain? (Neuropathic pain is often described as shooting, stabbing, burning, or searing.)  Is your pain worse at any time of the day? (Neuropathic pain is usually worse at night.)  Does the pain seem to follow a certain physical pathway?  Does the pain come from an area that has missing or injured nerves? (An example would be phantom limb pain.)  Is the pain triggered by minor things such as rubbing against the sheets at night? These questions often help define the type of pain involved. Once your caregiver knows what is happening, treatment can begin. Anticonvulsant, antidepressant drugs, and various pain relievers seem to work in some cases. If another condition, such as diabetes is involved, better management of that disorder may relieve the neuropathic pain.  TREATMENT  Neuropathic pain is frequently long-lasting and tends not to respond to treatment with narcotic type pain medication. It may respond well to other drugs such as antiseizure and antidepressant medications. Usually, neuropathic problems do not completely go away, but partial improvement is often possible with proper treatment. Your caregivers have large numbers of medications available to treat you. Do not be discouraged if you do not get immediate relief. Sometimes different medications or a combination of medications will be tried before you receive the results you are hoping for. See your caregiver if you have  pain that seems to be coming from nowhere and does not go away. Help is available.  SEEK IMMEDIATE MEDICAL CARE IF:   There is a sudden change in the quality of your pain, especially if the change is on only one side of the body.  You notice changes of the skin, such as redness, black or purple discoloration, swelling, or an ulcer.  You cannot move the affected limbs. Document Released: 01/24/2004 Document Revised: 07/21/2011 Document Reviewed: 01/24/2004 [facility] Patient  Information 2015 [place], [place]. This information is not intended to replace advice given to you by your health care provider. Make sure you discuss any questions you have with your health care provider.

## 2014-11-21 NOTE — ED Notes (Signed)
Patient coming from home with c/o of bilateral leg pain x 2 months with worsening pain in the past 2 days.  Patient states he feels like his legs are burning from the calve down and they go numb.

## 2014-11-21 NOTE — ED Provider Notes (Signed)
CSN: [id number]     Arrival date & time 11/21/14  0502 History   First MD Initiated Contact with Patient 11/21/14 0602     Chief Complaint  Patient presents with  . Leg Pain    bilateral     (Consider location/radiation/quality/duration/timing/severity/associated sxs/prior Treatment) HPI Comments: Gabriel Dalton is a 29 y.o. male with a PMHx of chronic bronchitis, who presents to the ED with complaints of bilateral leg pain 2 months. He describes the pain is 7/10 tingling and burning in the calves down to the arch of his foot, constant, worse with being still and walking, improved somewhat with rubbing his legs, and unrelieved with Goody powders. He endorses associated "different" sensation in his legs, but not quite completely numb. 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 He denies any trauma or injury, leg swelling, skin changes, fevers, chills, chest pain, shortness of breath, abdominal pain, nausea, vomiting, diarrhea, constipation, dysuria, hematuria, complete numbness, weakness, back or neck pain, headache, or vision changes. He is social drinker, and smokes daily. He has no primary care doctor.  Patient is a 29 y.o. male presenting with leg pain. The history is provided by the patient. No language interpreter was used.  Leg Pain Location:  Leg Time since incident:  2 months Injury: no   Leg location:  L lower leg and R lower leg Pain details:    Quality:  Burning and tingling   Radiates to:  Does not radiate   Severity:  Moderate   Onset quality:  Gradual   Duration:  2 months   Timing:  Constant   Progression:  Worsening Chronicity:  New Prior injury to area:  No Relieved by: rubbing legs. Worsened by:  Activity (being still and walking) Ineffective treatments:  NSAIDs (goody's powders) Associated symptoms: numbness ("different" sensation but not numb) and tingling   Associated symptoms: no back pain, no decreased ROM, no fever, no muscle weakness, no  neck pain and no swelling     Past Medical History  Diagnosis Date  . Bronchitis    Past Surgical History  Procedure Laterality Date  . No past surgeries    . I&d extremity  10/18/2011    Procedure: IRRIGATION AND DEBRIDEMENT EXTREMITY;  Surgeon: Dominica Severin, MD;  Location: [facility] OR;  Service: Orthopedics;  Laterality: Right;  Irrigation and Debridement  Right Middle Finger; Removal of foreign body   No family history on file. History  Substance Use Topics  . Smoking status: Current Every Day Smoker -- 0.50 packs/day  . Smokeless tobacco: Not on file  . Alcohol Use: 3.6 oz/week    6 Cans of beer per week    Review of Systems  Constitutional: Negative for fever and chills.  Eyes: Negative for visual disturbance.  Respiratory: Negative for shortness of breath.   Cardiovascular: Negative for chest pain.  Gastrointestinal: Negative for nausea, vomiting, abdominal pain, diarrhea and constipation.  Genitourinary: Negative for dysuria and hematuria.  Musculoskeletal: Positive for myalgias (b/l calves). Negative for back pain, arthralgias and neck pain.  Skin: Negative for color change.  Allergic/Immunologic: Negative for immunocompromised state.  Neurological: Positive for numbness ("different" sensation in calves, but not completely numb). Negative for weakness and headaches.       +tingling in calves/feet bilaterally  Psychiatric/Behavioral: Negative for confusion.   10 Systems reviewed and are negative for acute change except as noted in the HPI.    Allergies  Review of patient's allergies indicates no known allergies.  Home  Medications   Prior to Admission medications   Medication Sig Start Date End Date Taking? Authorizing Provider  ibuprofen (MOTRIN IB) 200 MG tablet Take 2-3 tablets (400-600 mg total) by mouth every 6 (six) hours as needed for pain. 07/18/12   Calvert CantorSaima Rizwan, MD   BP 122/79 mmHg  Pulse 88  Temp(Src) 98.3 F (36.8 C)  Resp 16  Ht 5\' 8"  (1.727 m)  Wt 135  lb (61.236 kg)  BMI 20.53 kg/m2  SpO2 100% Physical Exam  Constitutional: He is oriented to person, place, and time. Vital signs are normal. He appears well-developed and well-nourished.  Non-toxic appearance. No distress.  Afebrile, nontoxic, NAD  HENT:  Head: Normocephalic and atraumatic.  Mouth/Throat: Oropharynx is clear and moist and mucous membranes are normal.  Eyes: Conjunctivae and EOM are normal. Right eye exhibits no discharge. Left eye exhibits no discharge.  Neck: Normal range of motion. Neck supple.  Cardiovascular: Normal rate, regular rhythm, normal heart sounds and intact distal pulses.  Exam reveals no gallop and no friction rub.   No murmur heard. Pulmonary/Chest: Effort normal and breath sounds normal. No respiratory distress. He has no decreased breath sounds. He has no wheezes. He has no rhonchi. He has no rales.  Abdominal: Soft. Normal appearance and bowel sounds are normal. He exhibits no distension. There is no tenderness. There is no rigidity, no rebound, no guarding, no CVA tenderness, no tenderness at McBurney's point and negative Murphy's sign.  Musculoskeletal: Normal range of motion.       Right lower leg: Normal.       Left lower leg: Normal.  B/L knees and ankles with FROM intact, no bony or joint line tenderness, no swelling or deformities. Calves nonTTP without pedal edema. No skin erythema or warmth. Strength 5/5 bilaterally. Sensation grossly intact although pt states it feels "different" from mid-calf down to the arch of the foot. All spinal levels nonTTP without bony step offs or deformities  Neurological: He is alert and oriented to person, place, and time. He has normal strength. No sensory deficit. Gait normal.  Strength and sensation as described above. Gait steady  Skin: Skin is warm, dry and intact. No rash noted.  Psychiatric: He has a normal mood and affect.  Nursing note and vitals reviewed.   ED Course  Procedures (including critical care  time) Labs Review Labs Reviewed  CBC WITH DIFFERENTIAL/PLATELET  BASIC METABOLIC PANEL    Imaging Review No results found.   EKG Interpretation None      MDM   Final diagnoses:  Numbness and tingling of both legs below knees  Leg pain, bilateral    29 y.o. male here with b/l leg tingling/burning x2 months worsening over the last 2 days. Physically active job. On exam, neurovascularly intact with soft compartments, no swelling or erythema/warmth, neg homan's sign, strong pulses bilaterally. States the sensation feels "different" near the bottom of his calves and feet, but still able to distinguish light touch. Drinks alcohol socially, smokes daily. Discussed that it could be vitamin deficiency such as B12, but this would need to be investigated as an outpt. Will check basic labs to check for any electrolyte abnormalities or anemia, but doubt need for imaging or ultrasound. No claudication, doubt need for ABI. Will give pain control now but [facility] and have further investigation. Potentially could use referral to neuro for EMG/NCS.  7:34 AM Labs unremarkable. Will start on multivitamin, and give some  naprosyn/norco for pain. Will refer to [facility] for ongoing eval, as well as neuro for possible EMG/NCS. I explained the diagnosis and have given explicit precautions to return to the ER including for any other new or worsening symptoms. The patient understands and accepts the medical plan as it's been dictated and I have answered their questions. Discharge instructions concerning home care and prescriptions have been given. The patient is STABLE and is discharged to home in good condition.  BP 120/76 mmHg  Pulse 76  Temp(Src) 98.3 F (36.8 C)  Resp 16  Ht 5\' 8"  (1.727 m)  Wt 135 lb (61.236 kg)  BMI 20.53 kg/m2  SpO2 100%  Meds ordered this encounter  Medications  . HYDROcodone-acetaminophen (NORCO/VICODIN) 5-325 MG per tablet 1 tablet     Sig:   . HYDROcodone-acetaminophen (NORCO) 5-325 MG per tablet    Sig: Take 1 tablet by mouth every 6 (six) hours as needed for severe pain.    Dispense:  6 tablet    Refill:  0    Order Specific Question:  Supervising Provider    Answer:  MILLER, BRIAN [[id number]]  . naproxen (NAPROSYN) 500 MG tablet    Sig: Take 1 tablet (500 mg total) by mouth 2 (two) times daily as needed for mild pain, moderate pain or headache (TAKE WITH MEALS.).    Dispense:  20 tablet    Refill:  0    Order Specific Question:  Supervising Provider    Answer:  MILLER, BRIAN [[id number]]  . b complex-C-folic acid 1 MG capsule    Sig: Take 1 capsule (1 mg total) by mouth daily.    Dispense:  30 capsule    Refill:  0    Order Specific Question:  Supervising Provider    Answer:  Eber Hong [[id number]]      Argyle Gustafson Camprubi-Soms, PA-C 11/21/14 4098  Marisa Severin, MD 11/21/14 [phone]

## 2014-11-21 NOTE — ED Notes (Signed)
Phlebotomy at bedside.

## 2015-02-14 ENCOUNTER — Emergency Department (HOSPITAL_COMMUNITY)
Admission: EM | Admit: 2015-02-14 | Discharge: 2015-02-14 | Disposition: A | Discharge status: Home / Self Care | Payer: Self-pay | Attending: Emergency Medicine | Admitting: Emergency Medicine

## 2015-02-14 ENCOUNTER — Encounter (HOSPITAL_COMMUNITY): Payer: Self-pay | Admitting: Neurology

## 2015-02-14 DIAGNOSIS — R1310 Dysphagia, unspecified: Secondary | ICD-10-CM | POA: Insufficient documentation

## 2015-02-14 DIAGNOSIS — Z720 Tobacco use: Secondary | ICD-10-CM | POA: Insufficient documentation

## 2015-02-14 DIAGNOSIS — Z8709 Personal history of other diseases of the respiratory system: Secondary | ICD-10-CM | POA: Insufficient documentation

## 2015-02-14 DIAGNOSIS — K0381 Cracked tooth: Secondary | ICD-10-CM | POA: Insufficient documentation

## 2015-02-14 DIAGNOSIS — K0889 Other specified disorders of teeth and supporting structures: Secondary | ICD-10-CM | POA: Insufficient documentation

## 2015-02-14 DIAGNOSIS — Z79899 Other long term (current) drug therapy: Secondary | ICD-10-CM | POA: Insufficient documentation

## 2015-02-14 MED ORDER — AMOXICILLIN 500 MG PO CAPS
500.0000 mg | ORAL_CAPSULE | Freq: Three times a day (TID) | ORAL | Status: DC
Start: 2015-02-14 — End: 2017-07-31

## 2015-02-14 MED ORDER — OXYCODONE-ACETAMINOPHEN 5-325 MG PO TABS
1.0000 | ORAL_TABLET | Freq: Once | ORAL | Status: AC
  Administered 2015-02-14: 1 via ORAL
  Filled 2015-02-14: qty 1

## 2015-02-14 MED ORDER — TRAMADOL HCL 50 MG PO TABS: 50.0000 mg | ORAL_TABLET | Freq: Four times a day (QID) | ORAL | Status: DC | PRN

## 2015-02-14 NOTE — ED Notes (Signed)
Pt reports fillings to left upper and lower mouth  Have broken and is having pain for several days.

## 2015-02-14 NOTE — ED Provider Notes (Signed)
CSN: [id number]     Arrival date & time 02/14/15  0916 History  By signing my name below, I, Budd Palmer, attest that this documentation has been prepared under the direction and in the presence of [employer], PA-C. Electronically Signed: Budd Palmer, ED Scribe. 02/14/2015. 9:51 AM.     Chief Complaint  Patient presents with  . Dental Pain   The history is provided by the patient. No language interpreter was used.   HPI Comments: Baylen Buckner is a 29 y.o. male who presents to the Emergency Department complaining of constant, aching, worsening, left-sided dental pain onset 3 days ago. Pt states 2 fillings came out, one in a left upper and one in a left lower tooth, and that both teeth have broken. He reports associated trouble swallowing and jaw pain. He states he has been taking ibuprofen, vitamin C, and "everything else" with no relief. Pt denies ear pain.  Past Medical History  Diagnosis Date  . Bronchitis    Past Surgical History  Procedure Laterality Date  . No past surgeries    . I&d extremity  10/18/2011    Procedure: IRRIGATION AND DEBRIDEMENT EXTREMITY;  Surgeon: Dominica Severin, MD;  Location: [facility] OR;  Service: Orthopedics;  Laterality: Right;  Irrigation and Debridement  Right Middle Finger; Removal of foreign body   No family history on file. Social History  Substance Use Topics  . Smoking status: Current Every Day Smoker -- 0.50 packs/day  . Smokeless tobacco: None  . Alcohol Use: 3.6 oz/week    6 Cans of beer per week    Review of Systems  Constitutional: Negative for fever.  HENT: Positive for dental problem and trouble swallowing. Negative for ear pain.     Allergies  Review of patient's allergies indicates no known allergies.  Home Medications   Prior to Admission medications   Medication Sig Start Date End Date Taking? Authorizing Provider  Aspirin-Acetaminophen-Caffeine (GOODY HEADACHE PO) Take 1-2 packets by mouth 2 (two) times daily as  needed (pain).    Historical Provider, MD  b complex-C-folic acid 1 MG capsule Take 1 capsule (1 mg total) by mouth daily. 11/21/14   Mercedes Camprubi-Soms, PA-C  HYDROcodone-acetaminophen (NORCO) 5-325 MG per tablet Take 1 tablet by mouth every 6 (six) hours as needed for severe pain. 11/21/14   Mercedes Camprubi-Soms, PA-C  naproxen (NAPROSYN) 500 MG tablet Take 1 tablet (500 mg total) by mouth 2 (two) times daily as needed for mild pain, moderate pain or headache (TAKE WITH MEALS.). 11/21/14   Mercedes Camprubi-Soms, PA-C   BP 138/85 mmHg  Pulse 68  Temp(Src) 98.8 F (37.1 C) (Oral)  Resp 16  SpO2 100% Physical Exam  Constitutional: He is oriented to person, place, and time. He appears well-developed and well-nourished.  HENT:  Head: Normocephalic.  Left Ear: External ear normal.  Several cavities, tender to palpation over left upper first molar and left lower first molar. Normal gums, with no obvious abscesses or swelling. No trismus. The swelling under the tongue. Left TM is normal.  Eyes: Conjunctivae and EOM are normal.  Neck: Normal range of motion. Neck supple.  Pulmonary/Chest: Effort normal.  Abdominal: He exhibits no distension.  Musculoskeletal: Normal range of motion.  Neurological: He is alert and oriented to person, place, and time.  Psychiatric: He has a normal mood and affect.  Nursing note and vitals reviewed.   ED Course  Procedures  DIAGNOSTIC STUDIES: Oxygen Saturation is 100% on RA, normal by my interpretation.  COORDINATION OF CARE: 9:48 AM - Discussed plans to order pain medication and antibiotics. Advised to f/u with dentist. Pt advised of plan for treatment and pt agrees.  Labs Review Labs Reviewed - No data to display  Imaging Review No results found. I have personally reviewed and evaluated these images and lab results as part of my medical decision-making.   EKG Interpretation None      MDM   Final diagnoses:  Pain, dental    Patient  emergency department dental pain, unrelieved at home with ibuprofen. Patient has dental cavities, no obvious abscess. Will start amoxicillin, follow-up with a dentist. Tramadol for pain.  Filed Vitals:   02/14/15 0930  BP: 138/85  Pulse: 68  Temp: 98.8 F (37.1 C)  TempSrc: Oral  Resp: 16  SpO2: 100%  I personally performed the services described in this documentation, which was scribed in my presence. The recorded information has been reviewed and is accurate.   Jaynie Crumble, PA-C 02/14/15 6045  Azalia Bilis, MD 02/14/15 1159

## 2015-02-14 NOTE — ED Notes (Signed)
Declined W/C at D/C and was escorted to lobby by RN. 

## 2016-02-10 ENCOUNTER — Emergency Department (HOSPITAL_COMMUNITY): Payer: Self-pay

## 2016-02-10 ENCOUNTER — Emergency Department (HOSPITAL_COMMUNITY)
Admission: EM | Admit: 2016-02-10 | Discharge: 2016-02-10 | Disposition: A | Discharge status: Home / Self Care | Payer: Self-pay | Attending: Emergency Medicine | Admitting: Emergency Medicine

## 2016-02-10 ENCOUNTER — Encounter (HOSPITAL_COMMUNITY): Payer: Self-pay | Admitting: Emergency Medicine

## 2016-02-10 DIAGNOSIS — M79672 Pain in left foot: Secondary | ICD-10-CM | POA: Insufficient documentation

## 2016-02-10 DIAGNOSIS — F17200 Nicotine dependence, unspecified, uncomplicated: Secondary | ICD-10-CM | POA: Insufficient documentation

## 2016-02-10 DIAGNOSIS — Z7982 Long term (current) use of aspirin: Secondary | ICD-10-CM | POA: Insufficient documentation

## 2016-02-10 NOTE — ED Provider Notes (Signed)
[facility]-EMERGENCY DEPT Provider Note   CSN: [id number] Arrival date & time: 02/10/16  0007  By signing my name below, I, Gabriel Dalton, attest that this documentation has been prepared under the direction and in the presence of Tilden FossaElizabeth Radin Raptis, MD . Electronically Signed: Modena JanskyAlbert Dalton, Scribe. 02/10/2016. 1:04 AM.  History   Chief Complaint Chief Complaint  Patient presents with  . Foot Pain   The history is provided by the patient. No language interpreter was used.   HPI Comments: Gabriel Dalton is a 30 y.o. male with no pertinent PMHx who presents to the Emergency Department complaining of constant moderate left foot pain that started a month ago. Reports he was seen before for the same complaint. He was dx with poor circulation due to black spots and started on vitamin supplements without relief. He describes the pain as worsening this week, throbbing, and exacerbated by ambulation. Associated symptoms include a dark area on his left foot, left foot swelling/numbness, and mild back pain. Reports a hx of smoking. Denies any known trauma, inability to ambulate, fever, chest pain, SOB, or dysuria.    Past Medical History:  Diagnosis Date  . Bronchitis     There are no active problems to display for this patient.   Past Surgical History:  Procedure Laterality Date  . I&D EXTREMITY  10/18/2011   Procedure: IRRIGATION AND DEBRIDEMENT EXTREMITY;  Surgeon: Dominica SeverinWilliam Gramig, MD;  Location: [facility] OR;  Service: Orthopedics;  Laterality: Right;  Irrigation and Debridement  Right Middle Finger; Removal of foreign body  . NO PAST SURGERIES         Home Medications    Prior to Admission medications   Medication Sig Start Date End Date Taking? Authorizing Provider  amoxicillin (AMOXIL) 500 MG capsule Take 1 capsule (500 mg total) by mouth 3 (three) times daily. 02/14/15   Tatyana Kirichenko, PA-C  Aspirin-Acetaminophen-Caffeine (GOODY HEADACHE PO) Take 1-2 packets by mouth 2 (two) times daily  as needed (pain).    Historical Provider, MD  b complex-C-folic acid 1 MG capsule Take 1 capsule (1 mg total) by mouth daily. 11/21/14   Mercedes Camprubi-Soms, PA-C  HYDROcodone-acetaminophen (NORCO) 5-325 MG per tablet Take 1 tablet by mouth every 6 (six) hours as needed for severe pain. 11/21/14   Mercedes Camprubi-Soms, PA-C  naproxen (NAPROSYN) 500 MG tablet Take 1 tablet (500 mg total) by mouth 2 (two) times daily as needed for mild pain, moderate pain or headache (TAKE WITH MEALS.). 11/21/14   Mercedes Camprubi-Soms, PA-C  traMADol (ULTRAM) 50 MG tablet Take 1 tablet (50 mg total) by mouth every 6 (six) hours as needed. 02/14/15   Jaynie Crumbleatyana Kirichenko, PA-C    Family History No family history on file.  Social History Social History  Substance Use Topics  . Smoking status: Current Every Day Smoker    Packs/day: 0.00  . Smokeless tobacco: Never Used  . Alcohol use Yes     Allergies   Review of patient's allergies indicates no known allergies.   Review of Systems Review of Systems 10 Systems reviewed and all are negative for acute change except as noted in the HPI.  Physical Exam Updated Vital Signs BP 129/91 (BP Location: Left Arm)   Pulse 71   Temp 98.4 F (36.9 C) (Oral)   Resp 16   Ht 5\' 8"  (1.727 m)   Wt 136 lb (61.7 kg)   SpO2 100%   BMI 20.68 kg/m   Physical Exam  Constitutional: He is oriented to person, place,  and time. He appears well-developed and well-nourished.  HENT:  Head: Normocephalic and atraumatic.  Cardiovascular: Normal rate and regular rhythm.   Pulmonary/Chest: Effort normal. No respiratory distress.  Musculoskeletal: He exhibits no edema.  2+ DP pulses bilaterally. No swelling throughout the foot. There is significant tenderness to palpation over the dorsolateral aspect of the foot without any visible swelling or wounds.  Neurological: He is alert and oriented to person, place, and time.  Skin: Skin is warm and dry.  Psychiatric: He has a  normal mood and affect. His behavior is normal.  Nursing note and vitals reviewed.    ED Treatments / Results  DIAGNOSTIC STUDIES: Oxygen Saturation is 100% on RA, normal by my interpretation.    COORDINATION OF CARE: 1:08 AM- Pt advised of plan for treatment and pt agrees.  Labs (all labs ordered are listed, but only abnormal results are displayed) Labs Reviewed - No data to display  EKG  EKG Interpretation None       Radiology Dg Foot Complete Left  Result Date: 02/10/2016 CLINICAL DATA:  Left foot pain starting a month ago. Has been previously seen for same complaints. Pain is worsening this week. EXAM: LEFT FOOT - COMPLETE 3+ VIEW COMPARISON:  None. FINDINGS: There is no evidence of fracture or dislocation. There is no evidence of arthropathy or other focal bone abnormality. Soft tissues are unremarkable. IMPRESSION: Negative. Electronically Signed   By: Burman Nieves M.D.   On: 02/10/2016 01:55    Procedures Procedures (including critical care time)  Medications Ordered in ED Medications - No data to display   Initial Impression / Assessment and Plan / ED Course  I have reviewed the triage vital signs and the nursing notes.  Pertinent labs & imaging results that were available during my care of the patient were reviewed by me and considered in my medical decision making (see chart for details).  Clinical Course    Patient here for evaluation of nontraumatic left foot pain. He is neurovascularly intact on examination with tenderness over the dorsolateral aspect of the foot. No evidence of acute infection or poor perfusion. Discussed with patient unclear etiology of his symptoms.  Discussed home care with proper fitting footwear, ibuprofen prn pain, outpatient follow up, return precautions.    Final Clinical Impressions(s) / ED Diagnoses   Final diagnoses:  Foot pain, left    New Prescriptions New Prescriptions   No medications on file   I personally  performed the services described in this documentation, which was scribed in my presence. The recorded information has been reviewed and is accurate.     Tilden Fossa, MD 02/10/16 [phone]

## 2016-02-10 NOTE — ED Triage Notes (Signed)
Pt. reports chronic left foot pain with swelling for several months worse this week , denies injury / ambulatory .

## 2016-04-07 ENCOUNTER — Ambulatory Visit: Payer: Self-pay | Admitting: Podiatry

## 2016-06-09 ENCOUNTER — Emergency Department (HOSPITAL_COMMUNITY)
Admission: EM | Admit: 2016-06-09 | Discharge: 2016-06-09 | Disposition: A | Discharge status: Home / Self Care | Payer: Self-pay | Attending: Emergency Medicine | Admitting: Emergency Medicine

## 2016-06-09 ENCOUNTER — Encounter (HOSPITAL_COMMUNITY): Payer: Self-pay | Admitting: Emergency Medicine

## 2016-06-09 DIAGNOSIS — M79671 Pain in right foot: Secondary | ICD-10-CM | POA: Insufficient documentation

## 2016-06-09 DIAGNOSIS — Z7982 Long term (current) use of aspirin: Secondary | ICD-10-CM | POA: Insufficient documentation

## 2016-06-09 DIAGNOSIS — R21 Rash and other nonspecific skin eruption: Secondary | ICD-10-CM | POA: Insufficient documentation

## 2016-06-09 DIAGNOSIS — M79672 Pain in left foot: Secondary | ICD-10-CM | POA: Insufficient documentation

## 2016-06-09 DIAGNOSIS — F17200 Nicotine dependence, unspecified, uncomplicated: Secondary | ICD-10-CM | POA: Insufficient documentation

## 2016-06-09 LAB — CBG MONITORING, ED: GLUCOSE-CAPILLARY: 93 mg/dL (ref 65–99)

## 2016-06-09 MED ORDER — GABAPENTIN 300 MG PO CAPS
300.0000 mg | ORAL_CAPSULE | Freq: Once | ORAL | Status: AC
  Administered 2016-06-09: 300 mg via ORAL
  Filled 2016-06-09: qty 1

## 2016-06-09 MED ORDER — GABAPENTIN 300 MG PO CAPS: 300.0000 mg | ORAL_CAPSULE | Freq: Three times a day (TID) | ORAL | 0 refills | Status: DC

## 2016-06-09 NOTE — ED Provider Notes (Signed)
[facility]-EMERGENCY DEPT Provider Note   CSN: [id number] Arrival date & time: 06/09/16  2027     History   Chief Complaint Chief Complaint  Patient presents with  . Foot Pain     HPI   Blood pressure 124/83, pulse 90, temperature 98.3 F (36.8 C), temperature source Oral, resp. rate 16, SpO2 100 %.  Gabriel Dalton is a 31 y.o. male complaining of bilateral foot pain worse on the right worsening over the course of several months. He states that the pain is severe and keeps him from sleep at night. It is not exacerbated in the morning or activity. May be worse when standing. There has been no trauma. He's been taking over-the-counter pain medications with little relief. He also notes hyperpigmentation to the bilateral lower extremities.   Past Medical History:  Diagnosis Date  . Bronchitis     There are no active problems to display for this patient.   Past Surgical History:  Procedure Laterality Date  . I&D EXTREMITY  10/18/2011   Procedure: IRRIGATION AND DEBRIDEMENT EXTREMITY;  Surgeon: Dominica Severin, MD;  Location: [facility] OR;  Service: Orthopedics;  Laterality: Right;  Irrigation and Debridement  Right Middle Finger; Removal of foreign body  . NO PAST SURGERIES         Home Medications    Prior to Admission medications   Medication Sig Start Date End Date Taking? Authorizing Provider  amoxicillin (AMOXIL) 500 MG capsule Take 1 capsule (500 mg total) by mouth 3 (three) times daily. 02/14/15   Tatyana Kirichenko, PA-C  Aspirin-Acetaminophen-Caffeine (GOODY HEADACHE PO) Take 1-2 packets by mouth 2 (two) times daily as needed (pain).    Historical Provider, MD  b complex-C-folic acid 1 MG capsule Take 1 capsule (1 mg total) by mouth daily. 11/21/14   Mercedes [address], PA-C  gabapentin (NEURONTIN) 300 MG capsule Take 1 capsule (300 mg total) by mouth 3 (three) times daily. Start 300 mg by mouth daily for 1 day then increase to 300 mg by mouth twice a day for 1 day increase  to 300 mg by mouth 3 times daily to a max of 3600 mg per day. 06/09/16   Armon Orvis, PA-C  HYDROcodone-acetaminophen (NORCO) 5-325 MG per tablet Take 1 tablet by mouth every 6 (six) hours as needed for severe pain. 11/21/14   Mercedes [address], PA-C  naproxen (NAPROSYN) 500 MG tablet Take 1 tablet (500 mg total) by mouth 2 (two) times daily as needed for mild pain, moderate pain or headache (TAKE WITH MEALS.). 11/21/14   Mercedes [address], PA-C  traMADol (ULTRAM) 50 MG tablet Take 1 tablet (50 mg total) by mouth every 6 (six) hours as needed. 02/14/15   Jaynie Crumble, PA-C    Family History No family history on file.  Social History Social History  Substance Use Topics  . Smoking status: Current Every Day Smoker    Packs/day: 0.00  . Smokeless tobacco: Never Used  . Alcohol use Yes     Comment: occasional     Allergies   Patient has no known allergies.   Review of Systems Review of Systems  10 systems reviewed and found to be negative, except as noted in the HPI.   Physical Exam Updated Vital Signs BP 124/83 (BP Location: Right Arm)   Pulse 90   Temp 98.3 F (36.8 C) (Oral)   Resp 16   SpO2 100%   Physical Exam  Constitutional: He is oriented to person, place, and time. He appears  well-developed and well-nourished. No distress.  HENT:  Head: Normocephalic and atraumatic.  Mouth/Throat: Oropharynx is clear and moist.  Eyes: Conjunctivae and EOM are normal. Pupils are equal, round, and reactive to light.  Neck: Normal range of motion.  Cardiovascular: Normal rate, regular rhythm and intact distal pulses.   Pulmonary/Chest: Effort normal and breath sounds normal.  Abdominal: Soft. There is no tenderness.  Musculoskeletal: Normal range of motion.  DP and PT pulses are 2+ bilaterally, excellent range of motion to toes. Can differentiate between pinprick and light touch. Diffusely tender to palpation especially on the right foot far lateral aspect on the  dorsum and the volar aspect.    Neurological: He is alert and oriented to person, place, and time.  Skin: Rash noted. He is not diaphoretic.  Dry skin to bilateral lower extremities, hyperpigmentation in macular areas which are 1-2 cm and confluent. No warmth, tenderness palpation or discharge.  Psychiatric: He has a normal mood and affect.  Nursing note and vitals reviewed.    ED Treatments / Results  Labs (all labs ordered are listed, but only abnormal results are displayed) Labs Reviewed  CBG MONITORING, ED    EKG  EKG Interpretation None       Radiology No results found.  Procedures Procedures (including critical care time)  Medications Ordered in ED Medications  gabapentin (NEURONTIN) capsule 300 mg (300 mg Oral Given 06/09/16 2209)     Initial Impression / Assessment and Plan / ED Course  I have reviewed the triage vital signs and the nursing notes.  Pertinent labs & imaging results that were available during my care of the patient were reviewed by me and considered in my medical decision making (see chart for details).     Vitals:   06/09/16 2036  BP: 124/83  Pulse: 90  Resp: 16  Temp: 98.3 F (36.8 C)  TempSrc: Oral  SpO2: 100%    Medications  gabapentin (NEURONTIN) capsule 300 mg (300 mg Oral Given 06/09/16 2209)    Gabriel Dalton is 31 y.o. male presenting with Severe and worsening bilateral foot pain. Neurovascularly intact. History of present illness is not consistent with plantar fasciitis, I doubt this is a claudication. CBG is normal. Patient also has rash which appears to be a inflammatory hyperpigmentation. Patient is without insurance, case management is consulted and have advised him to present to the wellness Center to apply for the orange card. We'll start him on gabapentin, work note provided and he is given crutches.  Evaluation does not show pathology that would require ongoing emergent intervention or inpatient treatment. Pt is  hemodynamically stable and mentating appropriately. Discussed findings and plan with patient/guardian, who agrees with care plan. All questions answered. Return precautions discussed and outpatient follow up given.      Final Clinical Impressions(s) / ED Diagnoses   Final diagnoses:  Foot pain, bilateral  Rash and nonspecific skin eruption    New Prescriptions New Prescriptions   GABAPENTIN (NEURONTIN) 300 MG CAPSULE    Take 1 capsule (300 mg total) by mouth 3 (three) times daily. Start 300 mg by mouth daily for 1 day then increase to 300 mg by mouth twice a day for 1 day increase to 300 mg by mouth 3 times daily to a max of 3600 mg per day.     Wynetta Emeryicole Jaye Polidori, PA-C 06/09/16 2213    Donnetta HutchingBrian Cook, MD 06/11/16 [phone]

## 2016-06-09 NOTE — Discharge Instructions (Signed)
For pain control you may take:  800mg  of ibuprofen (that is usually 4 over the counter pills)  3 times a day (take with food) and acetaminophen 975mg  (this is 3 over the counter pills) four times a day. Do not drink alcohol or combine with other medications that have acetaminophen as an ingredient (Read the labels!).  For breakthrough pain you may take Gabpentin. Do not drink alcohol drive or operate heavy machinery when taking Gabapentin.  Please obtain primary care using resource guide below. Let them know that you were seen in the emergency room and that they will need to obtain records for further outpatient management.   Do not hesitate to return to the emergency room for any new, worsening or concerning symptoms.  Please obtain primary care using resource guide below. Let them know that you were seen in the emergency room and that they will need to obtain records for further outpatient management.

## 2016-06-09 NOTE — ED Triage Notes (Signed)
Pt c/o bilateral foot pain that began a year ago but has been worsening for the past 6 months; pt states it feels like his feet are sitting in ice; pt appears to ambulate without difficulty; pt also states he has dark spots on his lower legs; denies other symptoms

## 2016-06-26 ENCOUNTER — Ambulatory Visit: Payer: Self-pay

## 2017-07-31 ENCOUNTER — Ambulatory Visit
Admission: EM | Admit: 2017-07-31 | Discharge: 2017-07-31 | Disposition: A | Discharge status: Home / Self Care | Payer: PRIVATE HEALTH INSURANCE | Attending: Registered Nurse | Admitting: Registered Nurse

## 2017-07-31 ENCOUNTER — Other Ambulatory Visit: Payer: Self-pay

## 2017-07-31 DIAGNOSIS — Z0289 Encounter for other administrative examinations: Secondary | ICD-10-CM

## 2017-07-31 LAB — DEPT OF TRANSP DIPSTICK, URINE (ARMC ONLY)
GLUCOSE, UA: NEGATIVE mg/dL
Hgb urine dipstick: NEGATIVE
Protein, ur: NEGATIVE mg/dL
SPECIFIC GRAVITY, URINE: 1.025 (ref 1.005–1.030)

## 2017-07-31 NOTE — ED Triage Notes (Signed)
Patient is here for DOT physical.

## 2017-07-31 NOTE — ED Provider Notes (Signed)
[facility]    CSN: [id number] Arrival date & time: 07/31/17  0908     History   Chief Complaint Chief Complaint  Patient presents with  . Commercial Driver's License Exam    HPI Gabriel Dalton is a 32 y.o. male.   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  Applying to work with [employer] so he can be home nightly versus away from home 2-3 weeks at a time with [employer].  Denied health concerns other than runny nose spring allergies not taking any medicine for allergies.  Showering prior to going to bed.  Stated lower extremity/feet pain/rashes resolved and he is no longer taking vitamins or any other medications to include pain medications.  See Form [id number] copy under media tab in Epic.       Past Medical History:  Diagnosis Date  . Bronchitis     There are no active problems to display for this patient.   Past Surgical History:  Procedure Laterality Date  . I&D EXTREMITY  10/18/2011   Procedure: IRRIGATION AND DEBRIDEMENT EXTREMITY;  Surgeon: Roseanne Kaufman, MD;  Location: [facility];  Service: Orthopedics;  Laterality: Right;  Irrigation and Debridement  Right Middle Finger; Removal of foreign body  . NO PAST SURGERIES    I&D to remove glass fragment  Right hand 2013 noted on chart review healed NOS    Home Medications    Prior to Admission medications   Not on File    Family History Family History  Problem Relation Age of Onset  . Hypertension Father     Social History Social History   Tobacco Use  . Smoking status: Current Every Day Smoker    Packs/day: 0.50  . Smokeless tobacco: Never Used  Substance Use Topics  . Alcohol use: Yes    Comment: occasional  . Drug use: Not Currently     Allergies   Patient has no known allergies.   Review of Systems Review of Systems  Constitutional: Negative for chills and fever.  HENT: Positive for postnasal drip, rhinorrhea and sneezing. Negative for  congestion, dental problem, drooling, ear discharge, ear pain, facial swelling, hearing loss, mouth sores, nosebleeds, sinus pressure, sinus pain, sore throat, tinnitus, trouble swallowing and voice change.   Eyes: Negative for pain and discharge.  Respiratory: Negative for cough, chest tightness, wheezing and stridor.   Cardiovascular: Negative for chest pain, palpitations and leg swelling.  Gastrointestinal: Negative for abdominal pain, anal bleeding, blood in stool, constipation, diarrhea, nausea and vomiting.  Genitourinary: Negative for difficulty urinating, dysuria and hematuria.  Musculoskeletal: Negative for arthralgias, back pain, gait problem, joint swelling, myalgias, neck pain and neck stiffness.  Skin: Positive for color change. Negative for pallor, rash and wound.  Allergic/Immunologic: Positive for environmental allergies. Negative for food allergies.  Neurological: Negative for dizziness, tremors, seizures, syncope, facial asymmetry, speech difficulty, weakness, numbness and headaches.  Hematological: Negative for adenopathy. Does not bruise/bleed easily.  Psychiatric/Behavioral: Negative for agitation, confusion and sleep disturbance. The patient is not nervous/anxious.      Physical Exam Triage Vital Signs ED Triage Vitals [07/31/17 0940]  Enc Vitals Group     BP 135/81     Pulse Rate 74     Resp 18     Temp 98.2 F (36.8 C)     Temp Source Oral     SpO2 100 %     Weight 143 lb 6.4 oz (65 kg)     Height  '5\' 8"'  (1.727 m)     Head Circumference      Peak Flow      Pain Score 0     Pain Loc      Pain Edu?      Excl. in [place]?    No data found.  Updated Vital Signs BP 135/81 (BP Location: Left Arm)   Pulse 74   Temp 98.2 F (36.8 C) (Oral)   Resp 18   Ht '5\' 8"'  (1.727 m)   Wt 143 lb 6.4 oz (65 kg)   SpO2 100%   BMI 21.80 kg/m   Visual Acuity Right Eye Distance: 20/20(uncorrected) Left Eye Distance: 20/30(uncorrected) Bilateral  Distance: 20/20(uncorrected)  Right Eye Near:   Left Eye Near:    Bilateral Near:     Physical Exam  Constitutional: He is oriented to person, place, and time. Vital signs are normal. He appears well-developed and well-nourished. He is active and cooperative.  Non-toxic appearance. He does not have a sickly appearance. He does not appear ill. No distress.  HENT:  Head: Normocephalic and atraumatic.  Right Ear: Hearing, external ear and ear canal normal. A middle ear effusion is present.  Left Ear: Hearing, external ear and ear canal normal. A middle ear effusion is present.  Nose: Mucosal edema and rhinorrhea present. No nose lacerations, sinus tenderness, nasal deformity, septal deviation or nasal septal hematoma. No epistaxis.  No foreign bodies. Right sinus exhibits no maxillary sinus tenderness and no frontal sinus tenderness. Left sinus exhibits no maxillary sinus tenderness and no frontal sinus tenderness.  Mouth/Throat: Uvula is midline and mucous membranes are normal. Mucous membranes are not pale, not dry and not cyanotic. He does not have dentures. No oral lesions. No trismus in the jaw. Abnormal dentition. No dental abscesses, uvula swelling, lacerations or dental caries. Posterior oropharyngeal edema and posterior oropharyngeal erythema present. No oropharyngeal exudate or tonsillar abscesses.  Patient has metal borders on all front teeth; bilateral TMs air fluid level clear; cobblestoning posterior pharynx; bilateral allergic shiners; bilateral nasal turbinates with scant clear discharge yellow tinged  Eyes: Pupils are equal, round, and reactive to light. Conjunctivae, EOM and lids are normal. Right eye exhibits no chemosis, no discharge, no exudate and no hordeolum. No foreign body present in the right eye. Left eye exhibits no chemosis, no discharge, no exudate and no hordeolum. No foreign body present in the left eye. Right conjunctiva is not injected. Right conjunctiva has no  hemorrhage. Left conjunctiva is not injected. Left conjunctiva has no hemorrhage. No scleral icterus. Right eye exhibits normal extraocular motion and no nystagmus. Left eye exhibits normal extraocular motion and no nystagmus. Right pupil is round and reactive. Left pupil is round and reactive. Pupils are equal.  Neck: Trachea normal, normal range of motion and phonation normal. Neck supple. No JVD present. No tracheal tenderness, no spinous process tenderness and no muscular tenderness present. No neck rigidity. No tracheal deviation, no edema, no erythema and normal range of motion present. No thyroid mass and no thyromegaly present.  Cardiovascular: Normal rate, regular rhythm, S1 normal, S2 normal, normal heart sounds and intact distal pulses. PMI is not displaced. Exam reveals no gallop, no distant heart sounds and no friction rub.  No murmur heard. Pulses:      Radial pulses are 2+ on the right side, and 2+ on the left side.       Dorsalis pedis pulses are 2+ on the right side, and 2+ on the left side.  Pulmonary/Chest: Effort normal and breath sounds normal. No stridor. No respiratory distress. He has no decreased breath sounds. He has no wheezes. He has no rhonchi. He has no rales. He exhibits no tenderness.  Spoke full sentences without difficulty; no cough observed in exam room  Abdominal: Soft. Normal appearance and bowel sounds are normal. He exhibits no shifting dullness, no distension, no pulsatile liver, no fluid wave, no abdominal bruit, no ascites, no pulsatile midline mass and no mass. There is no hepatosplenomegaly. There is no tenderness. There is no rigidity, no rebound, no guarding, no CVA tenderness, no tenderness at McBurney's point and negative Murphy's sign. No hernia. Hernia confirmed negative in the ventral area, confirmed negative in the right inguinal area and confirmed negative in the left inguinal area.  [place] chaperoned inguinal hernia exam; dull to percussion  x 4 quads; normoactive bowel sounds x 4 quads  Musculoskeletal: Normal range of motion. He exhibits no edema, tenderness or deformity.       Right shoulder: Normal.       Left shoulder: Normal.       Right elbow: Normal.      Left elbow: Normal.       Right hip: Normal.       Left hip: Normal.       Right knee: Normal.       Left knee: Normal.       Right ankle: Normal.       Left ankle: Normal.       Cervical back: Normal.       Thoracic back: Normal.       Lumbar back: Normal.       Right hand: Normal.       Left hand: Normal.  Lymphadenopathy:       Head (right side): No submental, no submandibular, no tonsillar, no preauricular, no posterior auricular and no occipital adenopathy present.       Head (left side): No submental, no submandibular, no tonsillar, no preauricular, no posterior auricular and no occipital adenopathy present.    He has no cervical adenopathy.       Right cervical: No superficial cervical, no deep cervical and no posterior cervical adenopathy present.      Left cervical: No superficial cervical, no deep cervical and no posterior cervical adenopathy present.       Right: No inguinal adenopathy present.       Left: No inguinal adenopathy present.  Neurological: He is alert and oriented to person, place, and time. He has normal strength. He is not disoriented. He displays no atrophy, no tremor and normal reflexes. No cranial nerve deficit or sensory deficit. He exhibits normal muscle tone. He displays no seizure activity. Coordination and gait normal. GCS eye subscore is 4. GCS verbal subscore is 5. GCS motor subscore is 6.  Reflex Scores:      Brachioradialis reflexes are 2+ on the right side and 2+ on the left side.      Patellar reflexes are 2+ on the right side and 2+ on the left side.      Achilles reflexes are 2+ on the right side and 2+ on the left side. Bilateral hand grasp 5/5 equal; in/out of chair and on/off exam table without difficulty; gait sure and  steady in hallway  Skin: Skin is warm, dry and intact. Capillary refill takes less than 2 seconds. Rash noted. No abrasion, no bruising, no burn, no ecchymosis, no laceration, no lesion, no petechiae and  no purpura noted. Rash is macular. Rash is not papular, not maculopapular, not nodular, not pustular, not vesicular and not urticarial. He is not diaphoretic. No cyanosis or erythema. No pallor. Nails show no clubbing.     hyperpigmented nummular lesions less than 1cm diameter scattered bilateral lower extremities and soles of feet  nontender not itchy not hot to touch no fluctuance  Psychiatric: He has a normal mood and affect. His speech is normal and behavior is normal. Judgment and thought content normal. He is not actively hallucinating. Cognition and memory are normal. He is attentive.  Nursing note and vitals reviewed.    UC Treatments / Results  Labs (all labs ordered are listed, but only abnormal results are displayed) Labs Reviewed  DEPT OF TRANSP DIPSTICK, URINE([facility] ONLY)   Discussed negative/normal DOT dipstick results no blood, protein, sugar and SG 1.025 with patient in exam room.  Refused printout offerred.  Patient verbalized understanding information and had no further questions at this time.  EKG  EKG Interpretation None       Radiology No results found.  Procedures Procedures (including critical care time)  Medications Ordered in UC Medications - No data to display   Initial Impression / Assessment and Plan / UC Course  I have reviewed the triage vital signs and the nursing notes.  Pertinent labs & imaging results that were available during my care of the patient were reviewed by me and considered in my medical decision making (see chart for details).     DOT physical met standards  Final Clinical Impressions(s) / UC Diagnoses   Final diagnoses:  Encounter for examination required by Department of Transportation (DOT)    ED  Discharge Orders    None     Entered into database [facility] medical examiners and given printed Form MCSA [id number] typed and printed.  Discussed routine optometry exam every 2 years 20/30 left if vision worse than 20/40 required to see optometry for glasses/contacts prior to clearance. Patient instructed to follow up for re-evaluation in 2 years sooner if changes in his health status e.g. diagnosis chronic disease, surgery, hospitalization.  Discussed with patient urinalysis was normal.  Exitcare handout given on medical exam.  Patient verbalized understanding of information/instructions, agreed with plan of care and had no further questions at this time.  Patient may use normal saline nasal spray 2 sprays each nostril q2h wa as needed. flonase 8mg 1 spray each nostril BID OTC.  Discussed with patient these medications are nondrowsy/nonstimulant.   Patient denied personal or family history of ENT cancer.  OTC antihistamine of choice claritin/zyrtec 111mpo daily but caution may cause drowsiness test dose when not scheduled to drive.  Avoid triggers if possible.  Shower prior to bedtime if exposed to triggers.  If allergic dust/dust mites recommend mattress/pillow covers/encasements; washing linens, vacuuming, sweeping, dusting weekly.  Call or return to clinic as needed if these symptoms worsen or fail to improve as anticipated.  Patient verbalized understanding of instructions, agreed with plan of care and had no further questions at this time.  P2:  Avoidance and hand washing. Controlled Substance Prescriptions [place] Controlled Substance Registry consulted?No   BeOlen CordialNP 07/31/17 2243

## 2018-02-10 ENCOUNTER — Other Ambulatory Visit: Payer: Self-pay

## 2018-02-10 ENCOUNTER — Encounter (HOSPITAL_COMMUNITY): Payer: Self-pay | Admitting: Emergency Medicine

## 2018-02-10 ENCOUNTER — Emergency Department (HOSPITAL_COMMUNITY)
Admission: EM | Admit: 2018-02-10 | Discharge: 2018-02-10 | Disposition: A | Discharge status: Home / Self Care | Payer: Self-pay | Attending: Emergency Medicine | Admitting: Emergency Medicine

## 2018-02-10 ENCOUNTER — Emergency Department (HOSPITAL_COMMUNITY): Payer: Self-pay

## 2018-02-10 DIAGNOSIS — R1031 Right lower quadrant pain: Secondary | ICD-10-CM | POA: Insufficient documentation

## 2018-02-10 DIAGNOSIS — F17200 Nicotine dependence, unspecified, uncomplicated: Secondary | ICD-10-CM | POA: Insufficient documentation

## 2018-02-10 LAB — URINALYSIS, ROUTINE W REFLEX MICROSCOPIC
BILIRUBIN URINE: NEGATIVE
Glucose, UA: NEGATIVE mg/dL
Hgb urine dipstick: NEGATIVE
KETONES UR: NEGATIVE mg/dL
Leukocytes, UA: NEGATIVE
NITRITE: NEGATIVE
Protein, ur: NEGATIVE mg/dL
pH: 5 (ref 5.0–8.0)

## 2018-02-10 LAB — CBC WITH DIFFERENTIAL/PLATELET
Abs Immature Granulocytes: 0 10*3/uL (ref 0.0–0.1)
BASOS PCT: 0 %
Basophils Absolute: 0 10*3/uL (ref 0.0–0.1)
EOS ABS: 0.1 10*3/uL (ref 0.0–0.7)
Eosinophils Relative: 1 %
HCT: 41.6 % (ref 39.0–52.0)
Hemoglobin: 13.8 g/dL (ref 13.0–17.0)
IMMATURE GRANULOCYTES: 0 %
Lymphocytes Relative: 33 %
Lymphs Abs: 2.9 10*3/uL (ref 0.7–4.0)
MCH: 29.6 pg (ref 26.0–34.0)
MCHC: 33.2 g/dL (ref 30.0–36.0)
MCV: 89.1 fL (ref 78.0–100.0)
MONOS PCT: 8 %
Monocytes Absolute: 0.7 10*3/uL (ref 0.1–1.0)
NEUTROS PCT: 58 %
Neutro Abs: 5.1 10*3/uL (ref 1.7–7.7)
PLATELETS: 316 10*3/uL (ref 150–400)
RBC: 4.67 MIL/uL (ref 4.22–5.81)
RDW: 13.5 % (ref 11.5–15.5)
WBC: 8.9 10*3/uL (ref 4.0–10.5)

## 2018-02-10 LAB — COMPREHENSIVE METABOLIC PANEL
ALT: 14 U/L (ref 0–44)
ANION GAP: 10 (ref 5–15)
AST: 15 U/L (ref 15–41)
Albumin: 3.8 g/dL (ref 3.5–5.0)
Alkaline Phosphatase: 78 U/L (ref 38–126)
BUN: 9 mg/dL (ref 6–20)
CALCIUM: 9.4 mg/dL (ref 8.9–10.3)
CO2: 25 mmol/L (ref 22–32)
CREATININE: 0.96 mg/dL (ref 0.61–1.24)
Chloride: 103 mmol/L (ref 98–111)
Glucose, Bld: 120 mg/dL — ABNORMAL HIGH (ref 70–99)
Potassium: 3.6 mmol/L (ref 3.5–5.1)
Sodium: 138 mmol/L (ref 135–145)
TOTAL PROTEIN: 7.1 g/dL (ref 6.5–8.1)
Total Bilirubin: 0.9 mg/dL (ref 0.3–1.2)

## 2018-02-10 LAB — LIPASE, BLOOD: LIPASE: 25 U/L (ref 11–51)

## 2018-02-10 MED ORDER — HYDROMORPHONE HCL 1 MG/ML IJ SOLN
1.0000 mg | Freq: Once | INTRAMUSCULAR | Status: AC
  Administered 2018-02-10: 1 mg via INTRAVENOUS
  Filled 2018-02-10: qty 1

## 2018-02-10 MED ORDER — IOHEXOL 300 MG/ML  SOLN
150.0000 mL | Freq: Once | INTRAMUSCULAR | Status: AC | PRN
  Administered 2018-02-10: 150 mL via INTRAVENOUS

## 2018-02-10 MED ORDER — SODIUM CHLORIDE 0.9 % IV BOLUS
1000.0000 mL | Freq: Once | INTRAVENOUS | Status: AC
  Administered 2018-02-10: 1000 mL via INTRAVENOUS

## 2018-02-10 MED ORDER — ONDANSETRON HCL 4 MG/2ML IJ SOLN
4.0000 mg | Freq: Once | INTRAMUSCULAR | Status: AC
  Administered 2018-02-10: 4 mg via INTRAVENOUS
  Filled 2018-02-10: qty 2

## 2018-02-10 MED ORDER — DOCUSATE SODIUM 100 MG PO CAPS: 100.0000 mg | ORAL_CAPSULE | Freq: Two times a day (BID) | ORAL | 0 refills | Status: AC

## 2018-02-10 MED ORDER — HYDROCODONE-ACETAMINOPHEN 5-325 MG PO TABS: 2.0000 | ORAL_TABLET | ORAL | 0 refills | Status: DC | PRN

## 2018-02-10 MED ORDER — ONDANSETRON 4 MG PO TBDP: 4.0000 mg | ORAL_TABLET | Freq: Three times a day (TID) | ORAL | 0 refills | Status: DC | PRN

## 2018-02-10 NOTE — ED Provider Notes (Signed)
Patient placed in Quick Look pathway, seen and evaluated   Chief Complaint: abdominal pain  HPI: Eldredge Veldhuizen is a 32 y.o. male who arrived to the ED via EMS with sudden onset of sever abdominal pain followed by vomiting blood. Patient reports she was picking up a box when she felt the pain.  ROS: GI: n/v, abdominal pain  Physical Exam:  BP 128/84 (BP Location: Right Arm)   Pulse 87   Temp 98.4 F (36.9 C) (Oral)   Resp 16   Ht 5\' 8"  (1.727 m)   Wt 61.2 kg   SpO2 100%   BMI 20.53 kg/m    Gen: No distress, appears very uncomfortable  Neuro: Awake and Alert  Skin: Warm and dry  Abdomen: distended, generalized tenderness with increased tenderness above the umbilicus.   Initiation of care has begun. The patient has been counseled on the process, plan, and necessity for staying for the completion/evaluation, and the remainder of the medical screening examination    Janne Napoleon, NP 02/10/18 1458    Charlynne Pander, MD 02/10/18 [phone]

## 2018-02-10 NOTE — ED Provider Notes (Signed)
[facility] EMERGENCY DEPARTMENT Provider Note   CSN: [id number] Arrival date & time: 02/10/18  1315     History   Chief Complaint Chief Complaint  Patient presents with  . Abdominal Pain    HPI Gabriel Dalton is a 32 y.o. male.  32 year old male presents with complaint of abdominal pain.  Patient states pain started Monday night after he ate a pork chop and a piece of cake, states other members in the family had same meal and did not become ill.  Patient reports onset.  Umbilical abdominal pain a few hours later with nausea and diarrhea described as loose stools.  Patient states pain has moved from periumbilical area to suprapubic and right lower quadrant areas, constant, worse with walking, coughing, lying supine.  Associated with nausea and lack of appetite.  Denies fevers, chills, changes in bladder habits.  Patient reports having diarrhea until he took a     Past Medical History:  Diagnosis Date  . Bronchitis     There are no active problems to display for this patient.   Past Surgical History:  Procedure Laterality Date  . I&D EXTREMITY  10/18/2011   Procedure: IRRIGATION AND DEBRIDEMENT EXTREMITY;  Surgeon: Dominica Severin, MD;  Location: [facility];  Service: Orthopedics;  Laterality: Right;  Irrigation and Debridement  Right Middle Finger; Removal of foreign body  . NO PAST SURGERIES          Home Medications    Prior to Admission medications   Medication Sig Start Date End Date Taking? Authorizing Provider  docusate sodium (COLACE) 100 MG capsule Take 1 capsule (100 mg total) by mouth every 12 (twelve) hours for 10 days. 02/10/18 02/20/18  Jeannie Fend, PA-C  HYDROcodone-acetaminophen (NORCO/VICODIN) 5-325 MG tablet Take 2 tablets by mouth every 4 (four) hours as needed for up to 6 doses. 02/10/18   Jeannie Fend, PA-C  ondansetron (ZOFRAN ODT) 4 MG disintegrating tablet Take 1 tablet (4 mg total) by mouth every 8 (eight) hours as needed for  nausea or vomiting. 02/10/18   Jeannie Fend, PA-C    Family History Family History  Problem Relation Age of Onset  . Hypertension Father     Social History Social History   Tobacco Use  . Smoking status: Current Every Day Smoker    Packs/day: 0.50  . Smokeless tobacco: Never Used  Substance Use Topics  . Alcohol use: Yes    Comment: occasional  . Drug use: Not Currently     Allergies   Patient has no known allergies.   Review of Systems Review of Systems  Constitutional: Positive for appetite change. Negative for chills, diaphoresis and fever.  Respiratory: Negative for shortness of breath.   Cardiovascular: Negative for chest pain.  Gastrointestinal: Positive for abdominal pain, diarrhea and nausea. Negative for abdominal distention, blood in stool, constipation and vomiting.  Genitourinary: Negative for difficulty urinating, scrotal swelling and testicular pain.  Musculoskeletal: Negative for back pain.  Skin: Negative for rash and wound.  Allergic/Immunologic: Negative for immunocompromised state.  Neurological: Negative for weakness.  Hematological: Negative for adenopathy. Does not bruise/bleed easily.  Psychiatric/Behavioral: Negative for confusion.  All other systems reviewed and are negative.    Physical Exam Updated Vital Signs BP 125/71 (BP Location: Right Arm)   Pulse 70   Temp 98.8 F (37.1 C) (Oral)   Resp 11   Ht 5\' 8"  (1.727 m)   Wt 61.2 kg   SpO2 100%   BMI 20.53  kg/m   Physical Exam  Constitutional: He is oriented to person, place, and time. He appears well-developed and well-nourished. No distress.  HENT:  Head: Normocephalic and atraumatic.  Cardiovascular: Normal rate, regular rhythm and intact distal pulses.  Pulmonary/Chest: Effort normal and breath sounds normal.  Abdominal: Normal appearance and bowel sounds are normal. There is tenderness in the right lower quadrant. There is rebound and tenderness at McBurney's point. There is  no CVA tenderness.  + obturator   Neurological: He is alert and oriented to person, place, and time.  Skin: Skin is warm and dry. No rash noted. He is not diaphoretic.  Psychiatric: He has a normal mood and affect. His behavior is normal.  Nursing note and vitals reviewed.    ED Treatments / Results  Labs (all labs ordered are listed, but only abnormal results are displayed) Labs Reviewed  COMPREHENSIVE METABOLIC PANEL - Abnormal; Notable for the following components:      Result Value   Glucose, Bld 120 (*)    All other components within normal limits  URINALYSIS, ROUTINE W REFLEX MICROSCOPIC - Abnormal; Notable for the following components:   Specific Gravity, Urine >1.046 (*)    All other components within normal limits  CBC WITH DIFFERENTIAL/PLATELET  LIPASE, BLOOD    EKG None  Radiology Ct Abdomen Pelvis W Contrast  Result Date: 02/10/2018 CLINICAL DATA:  32 year old male with 3 days of lower abdominal pain, nausea and diarrhea. EXAM: CT ABDOMEN AND PELVIS WITH CONTRAST TECHNIQUE: Multidetector CT imaging of the abdomen and pelvis was performed using the standard protocol following bolus administration of intravenous contrast. CONTRAST:  OMNIPAQUE IOHEXOL 300 MG/ML  SOLN COMPARISON:  CT Abdomen and Pelvis 10/10/2009. FINDINGS: Lower chest: Stable and normal lung bases. Hepatobiliary: Negative liver and gallbladder. Pancreas: Negative. Spleen: Negative. Adrenals/Urinary Tract: Normal adrenal glands. Symmetric and normal renal enhancement. No perinephric stranding. No hydronephrosis. Diminutive and unremarkable urinary bladder. Chronic left hemipelvis phleboliths are stable since 2011. Stomach/Bowel: No oral contrast administered. Paucity of intra-abdominal fat. No dilated large bowel in the abdomen or pelvis. Intermittent decompressed colon. There is some retained stool in the right colon and transverse segments. No large bowel wall thickening is evident. The appendix is not  delineated. No pericecal inflammation is evident. Intermittently fluid-filled but nondilated small bowel throughout the abdomen and pelvis. Negative appearance of the stomach. No abdominal free air.  No free fluid identified. Vascular/Lymphatic: Major arterial structures in the abdomen and pelvis are patent. There is minimal distal aorta and internal iliac artery calcified atherosclerosis. Portal venous system is patent. No lymphadenopathy identified. Reproductive: Negative. Other: No pelvic free fluid is evident. Musculoskeletal: Negative. IMPRESSION: 1. Suboptimal characterization of bowel in the absence of oral contrast due to a paucity of intra-abdominal fat. There is no bowel obstruction.  No bowel inflammation is identified. 2. Very mild calcified atherosclerosis of the distal abdominal aorta and bilateral internal iliac arteries. 3. Otherwise negative CT of the abdomen and pelvis. Electronically Signed   By: Odessa Fleming M.D.   On: 02/10/2018 15:14    Procedures Procedures (including critical care time)  Medications Ordered in ED Medications  iohexol (OMNIPAQUE) 300 MG/ML solution 150 mL (150 mLs Intravenous Contrast Given 02/10/18 1501)  sodium chloride 0.9 % bolus 1,000 mL (0 mLs Intravenous Stopped 02/10/18 1930)  HYDROmorphone (DILAUDID) injection 1 mg (1 mg Intravenous Given 02/10/18 1807)  ondansetron (ZOFRAN) injection 4 mg (4 mg Intravenous Given 02/10/18 1807)     Initial Impression / Assessment and  Plan / ED Course  I have reviewed the triage vital signs and the nursing notes.  Pertinent labs & imaging results that were available during my care of the patient were reviewed by me and considered in my medical decision making (see chart for details).  Clinical Course as of Feb 11 1940  Wed Feb 10, 2018  249 32 year old male presents with complaint of abdominal pain with diarrhea (resolved with Pepto-Bismol), nausea, lack of appetite.  On exam patient has tenderness with rebound tenderness  in the right lower quadrant, positive obturator sign.  CT does not show the appendix however no signs of surrounding inflammation.  Case was discussed with Dr. Sheliah Hatch, on-call general surgery who has reviewed patient's CT scan, reports no signs of inflammation around the appendix, fluid-filled small bowel, consider enteritis.  Patient was given IV fluids and pain medication.  If pain improves, plan is for patient to recheck with PCP in 24 hours or return to ER for recheck in 24 hours if pain persists.   [LM]  1939 Discussed results and plan of care with patient.  Advised patient he needs 24-hour repeat abdominal exam, possible repeat lab work or repeat CT scan with oral contrast if he continues to have pain. Advised patient appendix was not seen although there are no signs of appendicitis, he could still have an appendicitis. Reports improvement in his pain with pain meds given in the Er. Given Rx for Norco (database verified no recent rx), as well as zofran and colace. Recommend liquid diet and advance as tolerated. Patient agrees to return to Er or go to UC for recheck in 24 hours, will return to ER sooner if needed.   [LM]    Clinical Course User Index [LM] Jeannie Fend, PA-C   Final Clinical Impressions(s) / ED Diagnoses   Final diagnoses:  Right lower quadrant abdominal pain    ED Discharge Orders         Ordered    HYDROcodone-acetaminophen (NORCO/VICODIN) 5-325 MG tablet  Every 4 hours PRN     02/10/18 1933    docusate sodium (COLACE) 100 MG capsule  Every 12 hours     02/10/18 1933    ondansetron (ZOFRAN ODT) 4 MG disintegrating tablet  Every 8 hours PRN     02/10/18 1933           Jeannie Fend, PA-C 02/10/18 1941    Mancel Bale, MD 02/12/18 1409

## 2018-02-10 NOTE — Discharge Instructions (Addendum)
Return to the emergency room at any point for any worsening or concerning symptoms.  Recheck with urgent care, PCP, ER if needed in the next 24 hours if pain persists. Take Zofran as needed as prescribed for nausea and vomiting. Take Colace as prescribed to keep stools soft. Take Norco as needed as prescribed for severe pain, do not drive or operate machinery while taking this medication, if you continue to need this medication longer than 24 hours you need to be re-seen.

## 2018-02-10 NOTE — ED Triage Notes (Signed)
Patient to ED c/o lower abdominal pain x 3 days with nausea and diarrhea. States the diarrhea stopped yesterday after taking anti-diarrheal. He reports pain has worsened since. Denies fevers/chills or urinary symptoms. Abdomen tender to palpation.

## 2019-03-17 ENCOUNTER — Emergency Department (HOSPITAL_COMMUNITY)
Admission: EM | Admit: 2019-03-17 | Discharge: 2019-03-18 | Discharge status: Against Medical Advice | Payer: Self-pay | Attending: Emergency Medicine | Admitting: Emergency Medicine

## 2019-03-17 ENCOUNTER — Other Ambulatory Visit: Payer: Self-pay

## 2019-03-17 ENCOUNTER — Encounter (HOSPITAL_COMMUNITY): Payer: Self-pay | Admitting: Emergency Medicine

## 2019-03-17 DIAGNOSIS — Z5321 Procedure and treatment not carried out due to patient leaving prior to being seen by health care provider: Secondary | ICD-10-CM | POA: Insufficient documentation

## 2019-03-17 LAB — CBC
HCT: 39.5 % (ref 39.0–52.0)
Hemoglobin: 13.5 g/dL (ref 13.0–17.0)
MCH: 30.1 pg (ref 26.0–34.0)
MCHC: 34.2 g/dL (ref 30.0–36.0)
MCV: 88 fL (ref 80.0–100.0)
Platelets: 354 10*3/uL (ref 150–400)
RBC: 4.49 MIL/uL (ref 4.22–5.81)
RDW: 14.4 % (ref 11.5–15.5)
WBC: 11.3 10*3/uL — ABNORMAL HIGH (ref 4.0–10.5)
nRBC: 0 % (ref 0.0–0.2)

## 2019-03-17 LAB — URINALYSIS, ROUTINE W REFLEX MICROSCOPIC
Bacteria, UA: NONE SEEN
Bilirubin Urine: NEGATIVE
Glucose, UA: NEGATIVE mg/dL
Hgb urine dipstick: NEGATIVE
Ketones, ur: 20 mg/dL — AB
Nitrite: NEGATIVE
Protein, ur: 30 mg/dL — AB
Specific Gravity, Urine: 1.029 (ref 1.005–1.030)
pH: 5 (ref 5.0–8.0)

## 2019-03-17 MED ORDER — SODIUM CHLORIDE 0.9% FLUSH: 3.0000 mL | Freq: Once | INTRAVENOUS | Status: DC

## 2019-03-17 NOTE — ED Triage Notes (Signed)
Patient reports low abdominal pain with nausea and diarrhea onset 2 weeks ago , denies fever or chills .

## 2019-03-18 ENCOUNTER — Other Ambulatory Visit: Payer: Self-pay

## 2019-03-18 ENCOUNTER — Ambulatory Visit (INDEPENDENT_AMBULATORY_CARE_PROVIDER_SITE_OTHER)
Admission: EM | Admit: 2019-03-18 | Discharge: 2019-03-18 | Disposition: A | Discharge status: Home / Self Care | Payer: Self-pay | Source: Home / Self Care

## 2019-03-18 ENCOUNTER — Encounter (HOSPITAL_COMMUNITY): Payer: Self-pay

## 2019-03-18 DIAGNOSIS — R1030 Lower abdominal pain, unspecified: Secondary | ICD-10-CM

## 2019-03-18 LAB — COMPREHENSIVE METABOLIC PANEL
ALT: 13 U/L (ref 0–44)
AST: 17 U/L (ref 15–41)
Albumin: 4.5 g/dL (ref 3.5–5.0)
Alkaline Phosphatase: 82 U/L (ref 38–126)
Anion gap: 14 (ref 5–15)
BUN: 11 mg/dL (ref 6–20)
CO2: 22 mmol/L (ref 22–32)
Calcium: 9.6 mg/dL (ref 8.9–10.3)
Chloride: 101 mmol/L (ref 98–111)
Creatinine, Ser: 0.92 mg/dL (ref 0.61–1.24)
GFR calc Af Amer: >60 mL/min (ref >60)
GFR calc non Af Amer: >60 mL/min (ref >60)
Glucose, Bld: 85 mg/dL (ref 70–99)
Potassium: 3.7 mmol/L (ref 3.5–5.1)
Sodium: 137 mmol/L (ref 135–145)
Total Bilirubin: 1.2 mg/dL (ref 0.3–1.2)
Total Protein: 8 g/dL (ref 6.5–8.1)

## 2019-03-18 LAB — LIPASE, BLOOD: Lipase: 25 U/L (ref 11–51)

## 2019-03-18 MED ORDER — OMEPRAZOLE 20 MG PO CPDR: 20.0000 mg | DELAYED_RELEASE_CAPSULE | Freq: Every day | ORAL | 0 refills | Status: DC

## 2019-03-18 NOTE — ED Triage Notes (Signed)
Pt states having lower abdominal pain x 2 weeks.Pt states every time he eats he feel bloated and like someone is stabbing his  stomach Pt states having diarrhea on and off x 2 weeks. Pt reports he lost 12 lbs aprox this year, as he is having low appetite.

## 2019-03-18 NOTE — ED Provider Notes (Signed)
[facility]    CSN: [id number] Arrival date & time: 03/18/19  1706      History   Chief Complaint Chief Complaint  Patient presents with  . Appointment    17:10  . Abdominal Pain    HPI Gabriel Dalton is a 33 y.o. male.   Patient presents with a 2-week history of lower abdominal pain.  He states he has had this pain intermittently for >1 year but it resolved after taking Prilosec.  He states his symptoms returned intermittently after stopping the Prilosec and have been worse x2 weeks.  He also reports intermittent diarrhea and decreased appetite.  He states he has lost approximately 12 pounds in the last year due to lack of appetite.  Last BM today.  He denies fever, chills, cough, shortness of breath, vomiting, or other symptoms.  He does not have a PCP.  The history is provided by the patient.    Past Medical History:  Diagnosis Date  . Bronchitis     There are no active problems to display for this patient.   Past Surgical History:  Procedure Laterality Date  . I&D EXTREMITY  10/18/2011   Procedure: IRRIGATION AND DEBRIDEMENT EXTREMITY;  Surgeon: Dominica SeverinWilliam Gramig, MD;  Location: [facility];  Service: Orthopedics;  Laterality: Right;  Irrigation and Debridement  Right Middle Finger; Removal of foreign body  . NO PAST SURGERIES         Home Medications    Prior to Admission medications   Medication Sig Start Date End Date Taking? Authorizing Provider  HYDROcodone-acetaminophen (NORCO/VICODIN) 5-325 MG tablet Take 2 tablets by mouth every 4 (four) hours as needed for up to 6 doses. 02/10/18   Jeannie FendMurphy, Laura A, PA-C  omeprazole (PRILOSEC) 20 MG capsule Take 1 capsule (20 mg total) by mouth daily. 03/18/19   Mickie Bailate, Breyon Sigg H, NP  ondansetron (ZOFRAN ODT) 4 MG disintegrating tablet Take 1 tablet (4 mg total) by mouth every 8 (eight) hours as needed for nausea or vomiting. 02/10/18   Jeannie FendMurphy, Laura A, PA-C    Family History Family History  Problem Relation Age of Onset   . Hypertension Father     Social History Social History   Tobacco Use  . Smoking status: Current Every Day Smoker    Packs/day: 0.50  . Smokeless tobacco: Never Used  Substance Use Topics  . Alcohol use: Yes    Comment: occasional  . Drug use: Not Currently     Allergies   Patient has no known allergies.   Review of Systems Review of Systems  Constitutional: Positive for appetite change. Negative for chills and fever.  HENT: Negative for ear pain and sore throat.   Eyes: Negative for pain and visual disturbance.  Respiratory: Negative for cough and shortness of breath.   Cardiovascular: Negative for chest pain and palpitations.  Gastrointestinal: Positive for abdominal pain and diarrhea. Negative for constipation, nausea and vomiting.  Genitourinary: Negative for dysuria and hematuria.  Musculoskeletal: Negative for arthralgias and back pain.  Skin: Negative for color change and rash.  Neurological: Negative for seizures and syncope.  All other systems reviewed and are negative.    Physical Exam Triage Vital Signs ED Triage Vitals  Enc Vitals Group     BP 03/18/19 1725 118/71     Pulse Rate 03/18/19 1725 78     Resp 03/18/19 1725 17     Temp 03/18/19 1725 98.4 F (36.9 C)     Temp Source 03/18/19 1725 Oral  SpO2 03/18/19 1725 100 %     Weight --      Height --      Head Circumference --      Peak Flow --      Pain Score 03/18/19 1722 9     Pain Loc --      Pain Edu? --      Excl. in [place]? --    No data found.  Updated Vital Signs BP 118/71 (BP Location: Left Arm)   Pulse 78   Temp 98.4 F (36.9 C) (Oral)   Resp 17   SpO2 100%   Visual Acuity Right Eye Distance:   Left Eye Distance:   Bilateral Distance:    Right Eye Near:   Left Eye Near:    Bilateral Near:     Physical Exam Vitals signs and nursing note reviewed.  Constitutional:      General: He is not in acute distress.    Appearance: He is well-developed. He is not ill-appearing.   HENT:     Head: Normocephalic and atraumatic.     Mouth/Throat:     Mouth: Mucous membranes are moist.     Pharynx: Oropharynx is clear.  Eyes:     Conjunctiva/sclera: Conjunctivae normal.  Neck:     Musculoskeletal: Neck supple.  Cardiovascular:     Rate and Rhythm: Normal rate and regular rhythm.     Heart sounds: No murmur.  Pulmonary:     Effort: Pulmonary effort is normal. No respiratory distress.     Breath sounds: Normal breath sounds.  Abdominal:     General: Bowel sounds are normal. There is no distension.     Palpations: Abdomen is soft.     Tenderness: There is no abdominal tenderness. There is no right CVA tenderness, left CVA tenderness, guarding or rebound.  Skin:    General: Skin is warm and dry.     Findings: No rash.  Neurological:     General: No focal deficit present.     Mental Status: He is alert and oriented to person, place, and time.      UC Treatments / Results  Labs (all labs ordered are listed, but only abnormal results are displayed) Labs Reviewed - No data to display  EKG   Radiology No results found.  Procedures Procedures (including critical care time)  Medications Ordered in UC Medications - No data to display  Initial Impression / Assessment and Plan / UC Course  I have reviewed the triage vital signs and the nursing notes.  Pertinent labs & imaging results that were available during my care of the patient were reviewed by me and considered in my medical decision making (see chart for details).   Lower abdominal pain.  Treating with omeprazole as this is what improved the patient's symptoms previously.  Instructed him to call the suggested PCP to schedule the soonest available appointment.  Instructed him to go to the emergency department if he develops severe abdominal pain or diarrhea or new symptoms such as fever or vomiting.  Patient agrees to plan of care.  Final Clinical Impressions(s) / UC Diagnoses   Final diagnoses:   Lower abdominal pain     Discharge Instructions     Call the primary care provider listed below to schedule an appointment.    Take the omeprazole daily as directed.    Go to the emergency department if you develop severe abdominal pain or diarrhea; or if you develop new symptoms such as  fever, vomiting, or other concerns.        ED Prescriptions    Medication Sig Dispense Auth. Provider   omeprazole (PRILOSEC) 20 MG capsule Take 1 capsule (20 mg total) by mouth daily. 30 capsule Mickie Bail, NP     PDMP not reviewed this encounter.   Mickie Bail, NP 03/18/19 1752

## 2019-03-18 NOTE — ED Notes (Signed)
Patient states "man I been here for six hours I gotta work in the morning." advised pt to stay. Patient left without being seen.

## 2019-03-18 NOTE — Discharge Instructions (Signed)
Call the primary care provider listed below to schedule an appointment.    Take the omeprazole daily as directed.    Go to the emergency department if you develop severe abdominal pain or diarrhea; or if you develop new symptoms such as fever, vomiting, or other concerns.

## 2019-03-23 ENCOUNTER — Ambulatory Visit (INDEPENDENT_AMBULATORY_CARE_PROVIDER_SITE_OTHER)
Admission: RE | Admit: 2019-03-23 | Discharge: 2019-03-23 | Disposition: A | Discharge status: Home / Self Care | Payer: PRIVATE HEALTH INSURANCE | Source: Ambulatory Visit | Attending: Family | Admitting: Family

## 2019-03-23 ENCOUNTER — Ambulatory Visit (INDEPENDENT_AMBULATORY_CARE_PROVIDER_SITE_OTHER): Payer: PRIVATE HEALTH INSURANCE | Admitting: Family

## 2019-03-23 ENCOUNTER — Encounter: Payer: Self-pay | Admitting: Family

## 2019-03-23 ENCOUNTER — Other Ambulatory Visit: Payer: Self-pay

## 2019-03-23 VITALS — BP 116/78 | HR 88 | Temp 98.2°F | Ht 68.0 in | Wt 134.6 lb

## 2019-03-23 DIAGNOSIS — G8929 Other chronic pain: Secondary | ICD-10-CM

## 2019-03-23 DIAGNOSIS — M545 Low back pain: Secondary | ICD-10-CM

## 2019-03-23 DIAGNOSIS — R195 Other fecal abnormalities: Secondary | ICD-10-CM | POA: Diagnosis not present

## 2019-03-23 DIAGNOSIS — R109 Unspecified abdominal pain: Secondary | ICD-10-CM | POA: Diagnosis not present

## 2019-03-23 DIAGNOSIS — R634 Abnormal weight loss: Secondary | ICD-10-CM

## 2019-03-23 MED ORDER — OMEPRAZOLE 20 MG PO CPDR: 20.0000 mg | DELAYED_RELEASE_CAPSULE | Freq: Two times a day (BID) | ORAL | 3 refills | Status: DC

## 2019-03-23 NOTE — Progress Notes (Signed)
Gabriel Dalton is a 33 y.o. male with the following history as recorded in [employer]:  There are no active problems to display for this patient.   Current Outpatient Medications  Medication Sig Dispense Refill  . omeprazole (PRILOSEC) 20 MG capsule Take 1 capsule (20 mg total) by mouth daily. 30 capsule 0  . omeprazole (PRILOSEC) 20 MG capsule Take 1 capsule (20 mg total) by mouth 2 (two) times daily before a meal. 60 capsule 3  . ondansetron (ZOFRAN ODT) 4 MG disintegrating tablet Take 1 tablet (4 mg total) by mouth every 8 (eight) hours as needed for nausea or vomiting. (Patient not taking: Reported on 03/23/2019) 12 tablet 0   No current facility-administered medications for this visit.     Allergies: Patient has no known allergies.  Past Medical History:  Diagnosis Date  . Bronchitis     Past Surgical History:  Procedure Laterality Date  . I&D EXTREMITY  10/18/2011   Procedure: IRRIGATION AND DEBRIDEMENT EXTREMITY;  Surgeon: Dominica Severin, MD;  Location: [facility];  Service: Orthopedics;  Laterality: Right;  Irrigation and Debridement  Right Middle Finger; Removal of foreign body  . NO PAST SURGERIES      Family History  Problem Relation Age of Onset  . Hypertension Father     Social History   Tobacco Use  . Smoking status: Current Every Day Smoker    Packs/day: 0.50  . Smokeless tobacco: Never Used  Substance Use Topics  . Alcohol use: Yes    Comment: occasional    Subjective:  Presents today as new patient; has been having abdominal pain "on and off" for the past year; notes that weight was around 147 when symptoms originally started and has lost about 13 pounds over the past year; better on Prilosec 20 mg but "still not right." Notes that stools can be loose but denies any vomiting; mentions that stools can occasionally "look tarry; Paternal uncle passed away from colon cancer in his 71s-60s;   Also notes that his podiatrist is asking for updated lumbar X-ray; wonders  if this can be done today;    Objective:  Vitals:   03/23/19 1138  BP: 116/78  Pulse: 88  Temp: 98.2 F (36.8 C)  TempSrc: Oral  SpO2: 98%  Weight: 134 lb 9.6 oz (61.1 kg)  Height: 5\' 8"  (1.727 m)    General: Well developed, well nourished, in no acute distress  Skin : Warm and dry.  Head: Normocephalic and atraumatic  Eyes: Sclera and conjunctiva clear; pupils round and reactive to light; extraocular movements intact  Lungs: Respirations unlabored; clear to auscultation bilaterally without wheeze, rales, rhonchi  CVS exam: normal rate and regular rhythm.  Abdomen: Soft; nontender; nondistended; normoactive bowel sounds; no masses or hepatosplenomegaly  Musculoskeletal: No deformities; no active joint inflammation  Extremities: No edema, cyanosis, clubbing  Vessels: Symmetric bilaterally  Neurologic: Alert and oriented; speech intact; face symmetrical; moves all extremities well; CNII-XII intact without focal deficit   Assessment:  1. Abdominal pain, unspecified abdominal location   2. Weight loss   3. Dark stools   4. Chronic low back pain, unspecified back pain laterality, unspecified whether sciatica present     Plan:  1. Reviewed labs done at [facility] last week; increase Prilosec to 20 mg bid; urgent referral to GI- ? Colitis or celiac disease or infection; discussed need to quit smoking; 4. Update lumbar X-ray;    No follow-ups on file.  Orders Placed This Encounter  Procedures  . DG  Lumbar Spine 2-3 Views    Standing Status:   Future    Number of Occurrences:   1    Standing Expiration Date:   05/22/2020    Order Specific Question:   Reason for Exam (SYMPTOM  OR DIAGNOSIS REQUIRED)    Answer:   low back pain    Order Specific Question:   Preferred imaging location?    Answer:   [facility]    Order Specific Question:   Radiology Contrast Protocol - do NOT remove file path    Answer:   [email or web address]  . Ambulatory  referral to Gastroenterology    Referral Priority:   Urgent    Referral Type:   Consultation    Referral Reason:   Specialty Services Required    Number of Visits Requested:   1    Requested Prescriptions   Signed Prescriptions Disp Refills  . omeprazole (PRILOSEC) 20 MG capsule 60 capsule 3    Sig: Take 1 capsule (20 mg total) by mouth 2 (two) times daily before a meal.

## 2019-04-01 ENCOUNTER — Other Ambulatory Visit: Payer: Self-pay

## 2019-04-01 ENCOUNTER — Encounter: Payer: Self-pay | Admitting: Gastroenterology

## 2019-04-01 ENCOUNTER — Ambulatory Visit (INDEPENDENT_AMBULATORY_CARE_PROVIDER_SITE_OTHER): Payer: PRIVATE HEALTH INSURANCE | Admitting: Gastroenterology

## 2019-04-01 VITALS — BP 114/72 | HR 67 | Temp 98.1°F | Ht 68.0 in | Wt 135.0 lb

## 2019-04-01 DIAGNOSIS — R1084 Generalized abdominal pain: Secondary | ICD-10-CM | POA: Diagnosis not present

## 2019-04-01 DIAGNOSIS — R197 Diarrhea, unspecified: Secondary | ICD-10-CM | POA: Diagnosis not present

## 2019-04-01 DIAGNOSIS — R634 Abnormal weight loss: Secondary | ICD-10-CM | POA: Insufficient documentation

## 2019-04-01 DIAGNOSIS — K921 Melena: Secondary | ICD-10-CM | POA: Insufficient documentation

## 2019-04-01 DIAGNOSIS — Z1159 Encounter for screening for other viral diseases: Secondary | ICD-10-CM | POA: Insufficient documentation

## 2019-04-01 MED ORDER — NA SULFATE-K SULFATE-MG SULF 17.5-3.13-1.6 GM/177ML PO SOLN: 1.0000 | Freq: Once | ORAL | 0 refills | Status: AC

## 2019-04-01 NOTE — Patient Instructions (Signed)
If you are age 32 or older, your body mass index should be between 23-30. Your Body mass index is 20.53 kg/m. If this is out of the aforementioned range listed, please consider follow up with your Primary Care Provider.  If you are age 33 or younger, your body mass index should be between 19-25. Your Body mass index is 20.53 kg/m. If this is out of the aformentioned range listed, please consider follow up with your Primary Care Provider.   You have been scheduled for an endoscopy and colonoscopy. Please follow the written instructions given to you at your visit today. Please pick up your prep supplies at the pharmacy within the next 1-3 days. If you use inhalers (even only as needed), please bring them with you on the day of your procedure.  We have sent the following medications to your pharmacy for you to pick up at your convenience: suprep   You may use Imodium as needed to help with your diarrhea  Thank you for choosing me and [facility]

## 2019-04-01 NOTE — Progress Notes (Signed)
04/01/2019 Gabriel Dalton [id number] 01/02/1986   HISTORY OF PRESENT ILLNESS: This is a 33 year old male who is new to our office.  He is here at the request of his PCP, Gabriel Mourning, FNP, for evaluation of abdominal pain.  He tells me that he has been having lower abdominal pains after eating intermittently for about the past year.  He says that last year when this began he went to the ER.  CT scan with IV contrast only, not p.o. contrast, was unremarkable, but with suboptimal evaluation of the bowel.  He said that he was prescribed omeprazole 20 mg daily, which he started taking and it did seem to help.  Once he was feeling better he discontinued it, however.  Then his pain returned ultimately at some point this year.  He says that about a month ago he started back on Prilosec again and even increased/doubled the dose to 40 mg, but it has not helped this time around.  He also describes diarrhea he says 5-8 times per day.  He says that sometimes even when he drinks liquids he has diarrhea.  He denies any nocturnal stools.  He says that the abdominal pain feels like a cramping/bloating sensation.  He says that since last year he has unintentionally lost 14 pounds.  He reports that a lot of times the stools are very dark black in color.  No red blood noted, however.  Recent CBC, lipase, CMP, and urinalysis were fairly unremarkable.  No nausea or vomiting.   Past Medical History:  Diagnosis Date  . Bronchitis    Past Surgical History:  Procedure Laterality Date  . I&D EXTREMITY  10/18/2011   Procedure: IRRIGATION AND DEBRIDEMENT EXTREMITY;  Surgeon: Roseanne Kaufman, MD;  Location: [place];  Service: Orthopedics;  Laterality: Right;  Irrigation and Debridement  Right Middle Finger; Removal of foreign body  . NO PAST SURGERIES      reports that he has been smoking. He has been smoking about 0.50 packs per day. He has never used smokeless tobacco. He reports current alcohol use. He reports  previous drug use. family history includes Colon cancer in his maternal uncle; Heart disease in his maternal aunt; Hypertension in his father. No Known Allergies    Outpatient Encounter Medications as of 04/01/2019  Medication Sig  . omeprazole (PRILOSEC) 20 MG capsule Take 1 capsule (20 mg total) by mouth 2 (two) times daily before a meal.  . [DISCONTINUED] omeprazole (PRILOSEC) 20 MG capsule Take 1 capsule (20 mg total) by mouth daily.  . [DISCONTINUED] ondansetron (ZOFRAN ODT) 4 MG disintegrating tablet Take 1 tablet (4 mg total) by mouth every 8 (eight) hours as needed for nausea or vomiting. (Patient not taking: Reported on 03/23/2019)   No facility-administered encounter medications on file as of 04/01/2019.      REVIEW OF SYSTEMS  : All other systems reviewed and negative except where noted in the History of Present Illness.   PHYSICAL EXAM: BP 114/72   Pulse 67   Temp 98.1 F (36.7 C) (Oral)   Ht 5\' 8"  (1.727 m)   Wt 135 lb (61.2 kg)   BMI 20.53 kg/m  General: Well developed black male in no acute distress Head: Normocephalic and atraumatic Eyes:  Sclerae anicteric, conjunctiva pink. Ears: Normal auditory acuity Lungs: Clear throughout to auscultation; no increased WOB. Heart: Regular rate and rhythm; no M/R/G. Abdomen: Soft, non-distended.  BS present.  Mild diffuse TTP. Rectal:  Will be done at the time  of colonoscopy. Musculoskeletal: Symmetrical with no gross deformities  Skin: No lesions on visible extremities Extremities: No edema  Neurological: Alert oriented x 4, grossly non-focal Psychological:  Alert and cooperative. Normal mood and affect  ASSESSMENT AND PLAN: 33 year old male with complaints of generalized abdominal pain (although more so lower abdomen), diarrhea, unintentional weight loss of about 14 pounds over the past year.  Also reports intermittent black stools.  Initially when it began last year he felt like he had relief with omeprazole and then  when he started to feel better he discontinued it.  Now he has been back on that medication with no improvement in symptoms this time.  CT scan without oral contrast but did have IV contrast last year was unremarkable, but suboptimal evaluation of the bowel.  Recent CBC, CMP, lipase unremarkable.  Will check sed rate, CRP, TSH, and celiac labs.  We will plan for both EGD and colonoscopy with Dr. Lavon Dalton.  Can use Imodium as needed for the diarrhea.  **The risks, benefits, and alternatives to EGD and colonoscopy were discussed with the patient and he consents to proceed.    CC:  Gabriel Dalton,*

## 2019-04-11 NOTE — Progress Notes (Signed)
Reviewed and agree with documentation and assessment and plan. K. Veena Nandigam , MD   

## 2019-04-18 ENCOUNTER — Ambulatory Visit (INDEPENDENT_AMBULATORY_CARE_PROVIDER_SITE_OTHER): Payer: PRIVATE HEALTH INSURANCE

## 2019-04-18 ENCOUNTER — Other Ambulatory Visit: Payer: Self-pay | Admitting: Gastroenterology

## 2019-04-18 DIAGNOSIS — Z1159 Encounter for screening for other viral diseases: Secondary | ICD-10-CM

## 2019-04-19 LAB — SARS CORONAVIRUS 2 (TAT 6-24 HRS): SARS Coronavirus 2: NEGATIVE

## 2019-04-20 ENCOUNTER — Ambulatory Visit (AMBULATORY_SURGERY_CENTER): Payer: PRIVATE HEALTH INSURANCE | Admitting: Gastroenterology

## 2019-04-20 ENCOUNTER — Encounter: Payer: Self-pay | Admitting: Gastroenterology

## 2019-04-20 ENCOUNTER — Other Ambulatory Visit: Payer: Self-pay

## 2019-04-20 VITALS — BP 113/75 | HR 81 | Temp 97.7°F | Resp 14 | Ht 68.0 in | Wt 135.0 lb

## 2019-04-20 DIAGNOSIS — R197 Diarrhea, unspecified: Secondary | ICD-10-CM

## 2019-04-20 DIAGNOSIS — K259 Gastric ulcer, unspecified as acute or chronic, without hemorrhage or perforation: Secondary | ICD-10-CM | POA: Diagnosis present

## 2019-04-20 DIAGNOSIS — K2950 Unspecified chronic gastritis without bleeding: Secondary | ICD-10-CM

## 2019-04-20 DIAGNOSIS — K6389 Other specified diseases of intestine: Secondary | ICD-10-CM | POA: Diagnosis not present

## 2019-04-20 DIAGNOSIS — R1084 Generalized abdominal pain: Secondary | ICD-10-CM

## 2019-04-20 DIAGNOSIS — K921 Melena: Secondary | ICD-10-CM

## 2019-04-20 MED ORDER — SODIUM CHLORIDE 0.9 % IV SOLN: 500.0000 mL | Freq: Once | INTRAVENOUS | Status: DC

## 2019-04-20 NOTE — Op Note (Signed)
[facility] Patient Name: Gabriel FannyJohnathan Dalton Procedure Date: 04/20/2019 11:44 AM MRN: [id number] Endoscopist: Napoleon FormKavitha V. Nandigam , MD Age: 33 Referring MD:  Date of Birth: [birth year] Gender: Male Account #: [id number] Procedure:                Colonoscopy Indications:              Obtain more precise diagnosis of inflammatory bowel                            disease, Generalized abdominal pain Medicines:                Monitored Anesthesia Care Procedure:                Pre-Anesthesia Assessment:                           - Prior to the procedure, a History and Physical                            was performed, and patient medications and                            allergies were reviewed. The patient's tolerance of                            previous anesthesia was also reviewed. The risks                            and benefits of the procedure and the sedation                            options and risks were discussed with the patient.                            All questions were answered, and informed consent                            was obtained. Prior Anticoagulants: The patient has                            taken no previous anticoagulant or antiplatelet                            agents. ASA Grade Assessment: II - A patient with                            mild systemic disease. After reviewing the risks                            and benefits, the patient was deemed in                            satisfactory condition to undergo the procedure.  After obtaining informed consent, the colonoscope                            was passed under direct vision. Throughout the                            procedure, the patient's blood pressure, pulse, and                            oxygen saturations were monitored continuously. The                            Colonoscope was introduced through the anus and                            advanced to  the the cecum, identified by                            appendiceal orifice and ileocecal valve. The                            colonoscopy was performed without difficulty. The                            patient tolerated the procedure well. The quality                            of the bowel preparation was good. The ileocecal                            valve, appendiceal orifice, and rectum were                            photographed. Scope In: 12:11:05 PM Scope Out: 12:32:43 PM Scope Withdrawal Time: 0 hours 13 minutes 42 seconds  Total Procedure Duration: 0 hours 21 minutes 38 seconds  Findings:                 The perianal and digital rectal examinations were                            normal.                           A patchy area of mucosa in the terminal ileum was                            mildly friable. Biopsies were taken with a cold                            forceps for histology.                           Discontinuous areas of nonbleeding ulcerated mucosa  were present in the rectum, in the sigmoid colon                            and in the transverse colon. Biopsies were taken                            with a cold forceps for histology. Complications:            No immediate complications. Estimated Blood Loss:     Estimated blood loss was minimal. Impression:               - Friability in the terminal ileum. Biopsied.                           - Mucosal ulceration. Biopsied. Recommendation:           - Patient has a contact number available for                            emergencies. The signs and symptoms of potential                            delayed complications were discussed with the                            patient. Return to normal activities tomorrow.                            Written discharge instructions were provided to the                            patient.                           - Resume previous diet.                            - Continue present medications.                           - Await pathology results.                           - Repeat colonoscopy date to be determined after                            pending pathology results are reviewed for                            surveillance based on pathology results.                           - Return to GI clinic at the next available                            appointment. Napoleon Form, MD 04/20/2019 12:50:58 PM This report has  been signed electronically.

## 2019-04-20 NOTE — Op Note (Signed)
[facility] Patient Name: Gabriel Dalton Procedure Date: 04/20/2019 11:45 AM MRN: [id number] Endoscopist: Mauri Pole , MD Age: 33 Referring MD:  Date of Birth: January 27, 1986 Gender: Male Account #: [id number] Procedure:                Upper GI endoscopy Indications:              Generalized abdominal pain, Failure to thrive,                            Malnutrition, Weight loss Medicines:                Monitored Anesthesia Care Procedure:                Pre-Anesthesia Assessment:                           - Prior to the procedure, a History and Physical                            was performed, and patient medications and                            allergies were reviewed. The patient's tolerance of                            previous anesthesia was also reviewed. The risks                            and benefits of the procedure and the sedation                            options and risks were discussed with the patient.                            All questions were answered, and informed consent                            was obtained. Prior Anticoagulants: The patient has                            taken no previous anticoagulant or antiplatelet                            agents. ASA Grade Assessment: II - A patient with                            mild systemic disease. After reviewing the risks                            and benefits, the patient was deemed in                            satisfactory condition to undergo the procedure.  After obtaining informed consent, the endoscope was                            passed under direct vision. Throughout the                            procedure, the patient's blood pressure, pulse, and                            oxygen saturations were monitored continuously. The                            Endoscope was introduced through the mouth, and                            advanced to the second  part of duodenum. The upper                            GI endoscopy was accomplished without difficulty.                            The patient tolerated the procedure well. Scope In: Scope Out: Findings:                 The esophagus was normal.                           A small hiatal hernia was present.                           Localized thick gastric folds with hemorrhage were                            found in the gastric antrum and in the prepyloric                            region of the stomach. Biopsies were taken with a                            cold forceps for histology.                           Few non-bleeding superficial gastric ulcers were                            found in the prepyloric region of the stomach.                           Diffuse moderate inflammation with hemorrhage                            characterized by congestion (edema), erythema,                            friability and granularity was found in  the entire                            examined stomach. Biopsies were taken with a cold                            forceps for Helicobacter pylori testing.                           The examined duodenum was normal. Biopsies for                            histology were taken with a cold forceps for                            evaluation of celiac disease. Complications:            No immediate complications. Estimated Blood Loss:     Estimated blood loss was minimal. Impression:               - Normal esophagus.                           - Small hiatal hernia.                           - Enlarged gastric folds with hemorrhage. Biopsied.                           - Non-bleeding gastric ulcers.                           - Gastritis with hemorrhage. Biopsied.                           - Normal examined duodenum. Biopsied. Recommendation:           - Patient has a contact number available for                            emergencies. The signs and  symptoms of potential                            delayed complications were discussed with the                            patient. Return to normal activities tomorrow.                            Written discharge instructions were provided to the                            patient.                           - Resume previous diet.                           -  Continue present medications.                           - Await pathology results. Napoleon Form, MD 04/20/2019 12:42:47 PM This report has been signed electronically.

## 2019-04-20 NOTE — Progress Notes (Signed)
PT taken to PACU. Monitors in place. VSS. Report given to RN. 

## 2019-04-20 NOTE — Progress Notes (Signed)
Called to room to assist during endoscopic procedure.  Patient ID and intended procedure confirmed with present staff. Received instructions for my participation in the procedure from the performing physician.  

## 2019-04-20 NOTE — Progress Notes (Signed)
VS-DT Temp-LC  

## 2019-04-20 NOTE — Patient Instructions (Signed)
Handout on gastritis given   YOU HAD AN ENDOSCOPIC PROCEDURE TODAY AT [facility]:   Refer to the procedure report that was given to you for any specific questions about what was found during the examination.  If the procedure report does not answer your questions, please call your gastroenterologist to clarify.  If you requested that your care partner not be given the details of your procedure findings, then the procedure report has been included in a sealed envelope for you to review at your convenience later.  YOU SHOULD EXPECT: Some feelings of bloating in the abdomen. Passage of more gas than usual.  Walking can help get rid of the air that was put into your GI tract during the procedure and reduce the bloating. If you had a lower endoscopy (such as a colonoscopy or flexible sigmoidoscopy) you may notice spotting of blood in your stool or on the toilet paper. If you underwent a bowel prep for your procedure, you may not have a normal bowel movement for a few days.  Please Note:  You might notice some irritation and congestion in your nose or some drainage.  This is from the oxygen used during your procedure.  There is no need for concern and it should clear up in a day or so.  SYMPTOMS TO REPORT IMMEDIATELY:   Following lower endoscopy (colonoscopy or flexible sigmoidoscopy):  Excessive amounts of blood in the stool  Significant tenderness or worsening of abdominal pains  Swelling of the abdomen that is new, acute  Fever of 100F or higher   Following upper endoscopy (EGD)  Vomiting of blood or coffee ground material  New chest pain or pain under the shoulder blades  Painful or persistently difficult swallowing  New shortness of breath  Fever of 100F or higher  Black, tarry-looking stools  For urgent or emergent issues, a gastroenterologist can be reached at any hour by calling [phone].   DIET:  We do recommend a small meal at first, but then you may  proceed to your regular diet.  Drink plenty of fluids but you should avoid alcoholic beverages for 24 hours.  ACTIVITY:  You should plan to take it easy for the rest of today and you should NOT DRIVE or use heavy machinery until tomorrow (because of the sedation medicines used during the test).    FOLLOW UP: Our staff will call the number listed on your records 48-72 hours following your procedure to check on you and address any questions or concerns that you may have regarding the information given to you following your procedure. If we do not reach you, we will leave a message.  We will attempt to reach you two times.  During this call, we will ask if you have developed any symptoms of COVID 19. If you develop any symptoms (ie: fever, flu-like symptoms, shortness of breath, cough etc.) before then, please call [phone].  If you test positive for Covid 19 in the 2 weeks post procedure, please call and report this information to [place].    If any biopsies were taken you will be contacted by phone or by letter within the next 1-3 weeks.  Please call us at [phone] if you have not heard about the biopsies in 3 weeks.    SIGNATURES/CONFIDENTIALITY: You and/or your care partner have signed paperwork which will be entered into your electronic medical record.  These signatures attest to the fact that that the information above on your After Visit  Summary has been reviewed and is understood.  Full responsibility of the confidentiality of this discharge information lies with you and/or your care-partner. 

## 2019-04-21 ENCOUNTER — Other Ambulatory Visit: Payer: Self-pay

## 2019-04-22 ENCOUNTER — Telehealth: Payer: Self-pay

## 2019-04-22 ENCOUNTER — Encounter (HOSPITAL_COMMUNITY): Payer: Self-pay | Admitting: Emergency Medicine

## 2019-04-22 ENCOUNTER — Other Ambulatory Visit: Payer: Self-pay

## 2019-04-22 ENCOUNTER — Emergency Department (HOSPITAL_COMMUNITY): Payer: PRIVATE HEALTH INSURANCE

## 2019-04-22 ENCOUNTER — Emergency Department (HOSPITAL_COMMUNITY)
Admission: EM | Admit: 2019-04-22 | Discharge: 2019-04-23 | Disposition: A | Discharge status: Home / Self Care | Payer: PRIVATE HEALTH INSURANCE | Attending: Emergency Medicine | Admitting: Emergency Medicine

## 2019-04-22 DIAGNOSIS — F17210 Nicotine dependence, cigarettes, uncomplicated: Secondary | ICD-10-CM | POA: Diagnosis not present

## 2019-04-22 DIAGNOSIS — R1030 Lower abdominal pain, unspecified: Secondary | ICD-10-CM | POA: Diagnosis present

## 2019-04-22 DIAGNOSIS — R1084 Generalized abdominal pain: Secondary | ICD-10-CM | POA: Diagnosis not present

## 2019-04-22 LAB — COMPREHENSIVE METABOLIC PANEL
ALT: 14 U/L (ref 0–44)
AST: 16 U/L (ref 15–41)
Albumin: 4.7 g/dL (ref 3.5–5.0)
Alkaline Phosphatase: 81 U/L (ref 38–126)
Anion gap: 11 (ref 5–15)
BUN: 9 mg/dL (ref 6–20)
CO2: 26 mmol/L (ref 22–32)
Calcium: 9.7 mg/dL (ref 8.9–10.3)
Chloride: 103 mmol/L (ref 98–111)
Creatinine, Ser: 0.8 mg/dL (ref 0.61–1.24)
GFR calc Af Amer: >60 mL/min (ref >60)
GFR calc non Af Amer: >60 mL/min (ref >60)
Glucose, Bld: 115 mg/dL — ABNORMAL HIGH (ref 70–99)
Potassium: 3.8 mmol/L (ref 3.5–5.1)
Sodium: 140 mmol/L (ref 135–145)
Total Bilirubin: 1.1 mg/dL (ref 0.3–1.2)
Total Protein: 8.4 g/dL — ABNORMAL HIGH (ref 6.5–8.1)

## 2019-04-22 LAB — CBC
HCT: 40.8 % (ref 39.0–52.0)
Hemoglobin: 13.7 g/dL (ref 13.0–17.0)
MCH: 29.8 pg (ref 26.0–34.0)
MCHC: 33.6 g/dL (ref 30.0–36.0)
MCV: 88.7 fL (ref 80.0–100.0)
Platelets: 371 10*3/uL (ref 150–400)
RBC: 4.6 MIL/uL (ref 4.22–5.81)
RDW: 14.4 % (ref 11.5–15.5)
WBC: 12.6 10*3/uL — ABNORMAL HIGH (ref 4.0–10.5)
nRBC: 0 % (ref 0.0–0.2)

## 2019-04-22 LAB — LIPASE, BLOOD: Lipase: 24 U/L (ref 11–51)

## 2019-04-22 MED ORDER — ONDANSETRON HCL 4 MG/2ML IJ SOLN
4.0000 mg | Freq: Once | INTRAMUSCULAR | Status: DC
  Filled 2019-04-22: qty 2

## 2019-04-22 MED ORDER — MORPHINE SULFATE (PF) 4 MG/ML IV SOLN
4.0000 mg | Freq: Once | INTRAVENOUS | Status: AC
  Administered 2019-04-22: 21:00:00 4 mg via INTRAVENOUS
  Filled 2019-04-22: qty 1

## 2019-04-22 MED ORDER — SODIUM CHLORIDE 0.9 % IV BOLUS
1000.0000 mL | Freq: Once | INTRAVENOUS | Status: AC
  Administered 2019-04-22: 1000 mL via INTRAVENOUS

## 2019-04-22 MED ORDER — IOHEXOL 300 MG/ML  SOLN
100.0000 mL | Freq: Once | INTRAMUSCULAR | Status: AC | PRN
  Administered 2019-04-22: 100 mL via INTRAVENOUS

## 2019-04-22 MED ORDER — HYDROMORPHONE HCL 1 MG/ML IJ SOLN
1.0000 mg | Freq: Once | INTRAMUSCULAR | Status: AC
  Administered 2019-04-22: 1 mg via INTRAVENOUS
  Filled 2019-04-22: qty 1

## 2019-04-22 MED ORDER — SODIUM CHLORIDE (PF) 0.9 % IJ SOLN
INTRAMUSCULAR | Status: AC
  Filled 2019-04-22: qty 50

## 2019-04-22 NOTE — ED Notes (Signed)
Patient transported to CT 

## 2019-04-22 NOTE — ED Notes (Signed)
Pt provided urinal and asked to provide urine sample when able.

## 2019-04-22 NOTE — Telephone Encounter (Signed)
Called patient back. He woke up with generalized abd pain this AM feels similar to the pain he has been having. He was doing well after EGD and colonoscopy until this morning. He does not want to go to ER given he has been to ER multiple times in the past few months for similar pain and says they usually dont do anything.  He has pain medication, hydrocodone and also Zofran for nausea. Advised him to eat small meals with soft diet.  Await pathology results, endoscopic findings suspicious for Crohn's. Will plan starting therapy for IBD once confirmed on biopsies.  Advised him to come to ER if continues to have persistent abdominal pain and is unable to tolerate diet.

## 2019-04-22 NOTE — Telephone Encounter (Signed)
Opened up a new note in error

## 2019-04-22 NOTE — ED Provider Notes (Signed)
[facility]-EMERGENCY DEPT Provider Note   CSN: [id number] Arrival date & time: 04/22/19  1947     History Chief Complaint  Patient presents with  . Abdominal Pain    Gabriel Dalton is a 33 y.o. male presents today for abdominal pain, nausea vomiting and small amount of diarrhea.  Patient presents today for increased abdominal pain after colonoscopy and endoscopy performed 2 days ago.  Patient had these procedures performed with gastroenterologist Dr. Lavon Paganini at [facility].  Patient has been having intermittent lower abdominal pains for 1 year after eating.  He had attempted multiple medications without relief.  He has had chronic diarrhea as well.  He reported to gastroenterologist that he has had been having dark stools as well as weight loss.  Patient reports that he has not yet received results from his colonoscopy or endoscopy 2 days ago.  He reports that yesterday, the day after the procedure he began to develop an increase to his normal abdominal pain.  He reports that when he woke up this morning pain had greatly increased.  He describes a severe generalized bloating aching sensation constant worsened with movement palpation and eating no alleviating factors or radiation, he reports the pain is on his entire stomach at the same time.  Denies history of fevers, headache, neck pain, chest pain/shortness of breath, cough, fall/injury, dysuria/hematuria or any additional concerns.   Colonoscopy 04/20/2019:  Findings: - The perianal and digital rectal examinations were normal. - A patchy area of mucosa in the terminal ileum was mildly friable. Biopsies were taken with a cold forceps for histology. - Discontinuous areas of nonbleeding ulcerated mucosa were present in the rectum, in the sigmoid colon and in the transverse colon. Biopsies were taken with a cold forceps for Histology.  Upper Endoscopy 04/20/2019:  Findings: - The esophagus was normal. - A small  hiatal hernia was present. - Localized thick gastric folds with hemorrhage were found in the gastric antrum and in the prepyloric region of the stomach. Biopsies were taken with a cold forceps for histology. - Few non-bleeding superficial gastric ulcers were found in the prepyloric region of the stomach. - Diffuse moderate inflammation with hemorrhage characterized by congestion (edema), erythema, friability and granularity was found in the entire examined stomach. Biopsies were taken with a cold forceps for Helicobacter pylori testing. - The examined duodenum was normal. Biopsies for histology were taken with a cold forceps for evaluation of celiac disease. [facility]  HPI     Past Medical History:  Diagnosis Date  . Bronchitis     Patient Active Problem List   Diagnosis Date Noted  . Generalized abdominal pain 04/01/2019  . Diarrhea 04/01/2019  . Loss of weight 04/01/2019  . Screening for viral disease 04/01/2019  . Black stools 04/01/2019    Past Surgical History:  Procedure Laterality Date  . I&D EXTREMITY  10/18/2011   Procedure: IRRIGATION AND DEBRIDEMENT EXTREMITY;  Surgeon: Dominica Severin, MD;  Location: [facility] OR;  Service: Orthopedics;  Laterality: Right;  Irrigation and Debridement  Right Middle Finger; Removal of foreign body  . NO PAST SURGERIES         Family History  Problem Relation Age of Onset  . Hypertension Father   . Colon cancer Maternal Uncle   . Rectal cancer Maternal Uncle   . Brain cancer Maternal Uncle   . Heart disease Maternal Aunt   . Esophageal cancer Neg Hx   . Stomach cancer Neg Hx  Social History   Tobacco Use  . Smoking status: Current Every Day Smoker    Packs/day: 0.50  . Smokeless tobacco: Never Used  Substance Use Topics  . Alcohol use: Yes    Comment: occasional  . Drug use: Not Currently    Home Medications Prior to Admission medications   Medication Sig Start Date End Date Taking? Authorizing Provider    omeprazole (PRILOSEC) 20 MG capsule Take 1 capsule (20 mg total) by mouth 2 (two) times daily before a meal. 03/23/19  Yes Marrian Salvage, [place]    Allergies    Patient has no known allergies.  Review of Systems   Review of Systems Ten systems are reviewed and are negative for acute change except as noted in the HPI  Physical Exam Updated Vital Signs BP 139/90   Pulse 95   Temp 98.9 F (37.2 C) (Oral)   Resp 16   SpO2 99%   Physical Exam Constitutional:      General: He is not in acute distress.    Appearance: Normal appearance. He is well-developed. He is not ill-appearing or diaphoretic.  HENT:     Head: Normocephalic and atraumatic.     Right Ear: External ear normal.     Left Ear: External ear normal.     Nose: Nose normal.  Eyes:     General: Vision grossly intact. Gaze aligned appropriately.     Pupils: Pupils are equal, round, and reactive to light.  Neck:     Trachea: Trachea and phonation normal. No tracheal deviation.  Cardiovascular:     Pulses:          Dorsalis pedis pulses are 2+ on the right side and 2+ on the left side.  Pulmonary:     Effort: Pulmonary effort is normal. No respiratory distress.  Abdominal:     General: There is no distension.     Palpations: Abdomen is soft.     Tenderness: There is generalized abdominal tenderness. There is guarding. There is no rebound.  Musculoskeletal:        General: Normal range of motion.     Cervical back: Normal range of motion.     Right lower leg: No edema.     Left lower leg: No edema.  Feet:     Right foot:     Protective Sensation: 3 sites tested. 3 sites sensed.     Left foot:     Protective Sensation: 3 sites tested. 3 sites sensed.  Skin:    General: Skin is warm and dry.  Neurological:     Mental Status: He is alert.     GCS: GCS eye subscore is 4. GCS verbal subscore is 5. GCS motor subscore is 6.     Comments: Speech is clear and goal oriented, follows commands Major Cranial nerves  without deficit, no facial droop Moves extremities without ataxia, coordination intact  Psychiatric:        Behavior: Behavior normal.     ED Results / Procedures / Treatments   Labs (all labs ordered are listed, but only abnormal results are displayed) Labs Reviewed  COMPREHENSIVE METABOLIC PANEL - Abnormal; Notable for the following components:      Result Value   Glucose, Bld 115 (*)    Total Protein 8.4 (*)    All other components within normal limits  CBC - Abnormal; Notable for the following components:   WBC 12.6 (*)    All other components within normal limits  LIPASE, BLOOD  URINALYSIS, ROUTINE W REFLEX MICROSCOPIC    EKG None  Radiology CT ABDOMEN PELVIS W CONTRAST  Result Date: 04/22/2019 CLINICAL DATA:  Abdominal pain and rectal bleeding, history of recent endoscopy 2 days ago EXAM: CT ABDOMEN AND PELVIS WITH CONTRAST TECHNIQUE: Multidetector CT imaging of the abdomen and pelvis was performed using the standard protocol following bolus administration of intravenous contrast. CONTRAST:  100mL OMNIPAQUE IOHEXOL 300 MG/ML  SOLN COMPARISON:  02/10/2018 FINDINGS: Lower chest: No acute abnormality. Hepatobiliary: No focal liver abnormality is seen. No gallstones, gallbladder wall thickening, or biliary dilatation. Pancreas: Unremarkable. No pancreatic ductal dilatation or surrounding inflammatory changes. Spleen: Normal in size without focal abnormality. Adrenals/Urinary Tract: Adrenal glands are within normal limits. Kidneys demonstrate a normal enhancement pattern. Bladder is partially distended. Stomach/Bowel: There is a paucity of intraperitoneal fat identified. No obstructive or inflammatory changes are seen. No areas of active extravasation are noted to suggest hemorrhage. The appendix is not well visualized. Vascular/Lymphatic: Aortic atherosclerosis. No enlarged abdominal or pelvic lymph nodes. Reproductive: Prostate is unremarkable. Other: No abdominal wall hernia or  abnormality. No abdominopelvic ascites. Musculoskeletal: No acute or significant osseous findings. IMPRESSION: No acute abnormality is noted to correspond with the patient's given clinical history. Electronically Signed   By: Alcide CleverMark  Lukens M.D.   On: 04/22/2019 22:40    Procedures Procedures (including critical care time)  Medications Ordered in ED Medications  ondansetron [facility](ZOFRAN) injection 4 mg (0 mg Intravenous Hold 04/22/19 2128)  sodium chloride (PF) 0.9 % injection (has no administration in time range)  sodium chloride 0.9 % bolus 1,000 mL (0 mLs Intravenous Stopped 04/22/19 2244)  morphine 4 MG/ML injection 4 mg (4 mg Intravenous Given 04/22/19 2105)  iohexol (OMNIPAQUE) 300 MG/ML solution 100 mL (100 mLs Intravenous Contrast Given 04/22/19 2206)  HYDROmorphone (DILAUDID) injection 1 mg (1 mg Intravenous Given 04/22/19 2342)    ED Course  I have reviewed the triage vital signs and the nursing notes.  Pertinent labs & imaging results that were available during my care of the patient were reviewed by me and considered in my medical decision making (see chart for details).    MDM Rules/Calculators/A&P  33 year old male with chronic abdominal pain and diarrhea arrives today with acutely worsening pain over the last 2 days after having endoscopy and colonoscopy.  He is uncomfortable with a tender abdomen and voluntary guarding.  Will obtain CT abdomen pelvis CBC with next dose of 12.6 Lipase within normal limit CMP nonacute Urinalysis pending CT abdomen pelvis:    IMPRESSION:  No acute abnormality is noted to correspond with the patient's given  clinical history.   CT scan personally reviewed, patient does have gas present within the intestines which may be a cause of his pain due to insufflation for colonoscopy. - Patient reassessed updated on CT scan and lab results as above and states understanding.  He is still endorsing pain and is requesting additional medication, I have  ordered Dilaudid, patient refused Zofran stating nausea improved.  Discussed case with Dr. Rhunette CroftNanavati, plan of care at this time is to control patient's pain p.o. challenge and anticipate discharge. - [facility] at shift change.  Patient has just been given second dose of pain medication.  Plan of care is to reassess, p.o. challenge, if patient improved and tolerating p.o. anticipate discharge with GI follow-up.  If unsuccessful may need consultation to GI.  Disposition per oncoming team.  Note: Portions of this report may have  been transcribed using voice recognition software. Every effort was made to ensure accuracy; however, inadvertent computerized transcription errors may still be present. Final Clinical Impression(s) / ED Diagnoses Final diagnoses:  Generalized abdominal pain    Rx / DC Orders ED Discharge Orders    None       Elizabeth Palau 04/23/19 0002    Derwood Kaplan, MD 05/02/19 1811

## 2019-04-22 NOTE — Telephone Encounter (Signed)
Patient returned our phone call after we tried to call this morning. He states he is having 10/10 pain in the lower abdominal area. Said he has not been able to have a BM but was feeling terrible. He also said that he tried taking his percocet this am and threw it up afterwards. I will let Dr. Silverio Decamp know and have her advise.

## 2019-04-22 NOTE — Telephone Encounter (Signed)
Opened in error

## 2019-04-22 NOTE — Discharge Instructions (Addendum)
You have been diagnosed today with Abdominal Pain.  Medications prescribed include Levsin and Zofran to help with symptoms.  If your symptoms persist, please call Dr. Silverio Decamp. Return here for worsening symptoms, persistent vomiting, fevers or other concerns.  At this time there does not appear to be the presence of an emergent medical condition, however there is always the potential for conditions to change. Please read and follow the below instructions.  Please return to the Emergency Department immediately for any new or worsening symptoms or if your symptoms do not improve within 2 days. Please be sure to follow up with your Primary Care Provider within one week regarding your visit today; please call their office to schedule an appointment even if you are feeling better for a follow-up visit.   Get help right away if: Your pain does not go away as soon as your doctor says it should. You cannot stop throwing up. Your pain is only in areas of your belly, such as the right side or the left lower part of the belly. You have bloody or black poop, or poop that looks like tar. You have very bad pain, cramping, or bloating in your belly. You have signs of not having enough fluid or water in your body (dehydration), such as: Dark pee, very little pee, or no pee. Cracked lips. Dry mouth. Sunken eyes. Sleepiness. Weakness. You have any new/concerning or worsening of symptoms  Please read the additional information packets attached to your discharge summary.  Do not take your medicine if  develop an itchy rash, swelling in your mouth or lips, or difficulty breathing; call 911 and seek immediate emergency medical attention if this occurs.  Note: Portions of this text may have been transcribed using voice recognition software. Every effort was made to ensure accuracy; however, inadvertent computerized transcription errors may still be present.

## 2019-04-22 NOTE — ED Triage Notes (Signed)
Patient reports lower abdominal pain with N/V and bright red rectal bleeding today. Reports had EGD and colonoscopy on Wednesday.

## 2019-04-22 NOTE — Telephone Encounter (Signed)
Attempted to reach pt. With follow-up call following endoscopic procedure 04/20/2019.  LM on pt. Voice mail.  Will try to reach pt. Again later today. 

## 2019-04-23 ENCOUNTER — Emergency Department (HOSPITAL_COMMUNITY)
Admission: EM | Admit: 2019-04-23 | Discharge: 2019-04-24 | Disposition: A | Discharge status: Home / Self Care | Payer: PRIVATE HEALTH INSURANCE | Source: Home / Self Care | Attending: Emergency Medicine | Admitting: Emergency Medicine

## 2019-04-23 ENCOUNTER — Encounter (HOSPITAL_COMMUNITY): Payer: Self-pay | Admitting: Emergency Medicine

## 2019-04-23 ENCOUNTER — Other Ambulatory Visit: Payer: Self-pay

## 2019-04-23 ENCOUNTER — Emergency Department (HOSPITAL_COMMUNITY): Payer: PRIVATE HEALTH INSURANCE

## 2019-04-23 DIAGNOSIS — R1110 Vomiting, unspecified: Secondary | ICD-10-CM

## 2019-04-23 DIAGNOSIS — R112 Nausea with vomiting, unspecified: Secondary | ICD-10-CM | POA: Insufficient documentation

## 2019-04-23 DIAGNOSIS — F1290 Cannabis use, unspecified, uncomplicated: Secondary | ICD-10-CM

## 2019-04-23 DIAGNOSIS — R1030 Lower abdominal pain, unspecified: Secondary | ICD-10-CM | POA: Insufficient documentation

## 2019-04-23 DIAGNOSIS — F17200 Nicotine dependence, unspecified, uncomplicated: Secondary | ICD-10-CM | POA: Insufficient documentation

## 2019-04-23 DIAGNOSIS — Z79899 Other long term (current) drug therapy: Secondary | ICD-10-CM | POA: Insufficient documentation

## 2019-04-23 LAB — COMPREHENSIVE METABOLIC PANEL
ALT: 13 U/L (ref 0–44)
AST: 14 U/L — ABNORMAL LOW (ref 15–41)
Albumin: 4.4 g/dL (ref 3.5–5.0)
Alkaline Phosphatase: 81 U/L (ref 38–126)
Anion gap: 11 (ref 5–15)
BUN: 11 mg/dL (ref 6–20)
CO2: 27 mmol/L (ref 22–32)
Calcium: 9.5 mg/dL (ref 8.9–10.3)
Chloride: 98 mmol/L (ref 98–111)
Creatinine, Ser: 0.8 mg/dL (ref 0.61–1.24)
GFR calc Af Amer: >60 mL/min (ref >60)
GFR calc non Af Amer: >60 mL/min (ref >60)
Glucose, Bld: 101 mg/dL — ABNORMAL HIGH (ref 70–99)
Potassium: 3.8 mmol/L (ref 3.5–5.1)
Sodium: 136 mmol/L (ref 135–145)
Total Bilirubin: 1.2 mg/dL (ref 0.3–1.2)
Total Protein: 8.3 g/dL — ABNORMAL HIGH (ref 6.5–8.1)

## 2019-04-23 LAB — CBC WITH DIFFERENTIAL/PLATELET
Abs Immature Granulocytes: 0.14 K/uL — ABNORMAL HIGH (ref 0.00–0.07)
Basophils Absolute: 0 K/uL (ref 0.0–0.1)
Basophils Relative: 0 %
Eosinophils Absolute: 0 K/uL (ref 0.0–0.5)
Eosinophils Relative: 0 %
HCT: 38.3 % — ABNORMAL LOW (ref 39.0–52.0)
Hemoglobin: 12.9 g/dL — ABNORMAL LOW (ref 13.0–17.0)
Immature Granulocytes: 1 %
Lymphocytes Relative: 12 %
Lymphs Abs: 2.4 K/uL (ref 0.7–4.0)
MCH: 30 pg (ref 26.0–34.0)
MCHC: 33.7 g/dL (ref 30.0–36.0)
MCV: 89.1 fL (ref 80.0–100.0)
Monocytes Absolute: 1.8 K/uL — ABNORMAL HIGH (ref 0.1–1.0)
Monocytes Relative: 9 %
Neutro Abs: 15.6 K/uL — ABNORMAL HIGH (ref 1.7–7.7)
Neutrophils Relative %: 78 %
Platelets: 324 K/uL (ref 150–400)
RBC: 4.3 MIL/uL (ref 4.22–5.81)
RDW: 14.2 % (ref 11.5–15.5)
WBC: 20 K/uL — ABNORMAL HIGH (ref 4.0–10.5)
nRBC: 0 % (ref 0.0–0.2)

## 2019-04-23 LAB — LIPASE, BLOOD: Lipase: 20 U/L (ref 11–51)

## 2019-04-23 LAB — LACTIC ACID, PLASMA: Lactic Acid, Venous: 1.2 mmol/L (ref 0.5–1.9)

## 2019-04-23 MED ORDER — HYDROMORPHONE HCL 1 MG/ML IJ SOLN
1.0000 mg | Freq: Once | INTRAMUSCULAR | Status: DC
  Administered 2019-04-24: 03:00:00 1 mg via INTRAVENOUS

## 2019-04-23 MED ORDER — SODIUM CHLORIDE 0.9 % IV BOLUS
1000.0000 mL | Freq: Once | INTRAVENOUS | Status: AC
  Administered 2019-04-23: 1000 mL via INTRAVENOUS

## 2019-04-23 MED ORDER — HYOSCYAMINE SULFATE SL 0.125 MG SL SUBL: 0.1250 mg | SUBLINGUAL_TABLET | SUBLINGUAL | 0 refills | Status: DC | PRN

## 2019-04-23 MED ORDER — ONDANSETRON HCL 4 MG/2ML IJ SOLN
4.0000 mg | Freq: Once | INTRAMUSCULAR | Status: AC
  Administered 2019-04-23: 4 mg via INTRAVENOUS
  Filled 2019-04-23: qty 2

## 2019-04-23 MED ORDER — ONDANSETRON 8 MG PO TBDP: ORAL_TABLET | ORAL | 0 refills | Status: DC

## 2019-04-23 MED ORDER — SODIUM CHLORIDE 0.9 % IV SOLN
Freq: Once | INTRAVENOUS | Status: DC
  Administered 2019-04-23: 01:00:00 via INTRAVENOUS

## 2019-04-23 MED ORDER — SIMETHICONE 40 MG/0.6ML PO SUSP (UNIT DOSE)
40.0000 mg | Freq: Once | ORAL | Status: AC
  Administered 2019-04-23: 40 mg via ORAL
  Filled 2019-04-23: qty 0.6

## 2019-04-23 MED ORDER — HALOPERIDOL LACTATE 5 MG/ML IJ SOLN
2.0000 mg | Freq: Once | INTRAMUSCULAR | Status: AC
  Administered 2019-04-24: 2 mg via INTRAVENOUS
  Filled 2019-04-23: qty 1

## 2019-04-23 MED ORDER — DICYCLOMINE HCL 10 MG PO CAPS
10.0000 mg | ORAL_CAPSULE | Freq: Once | ORAL | Status: AC
  Administered 2019-04-23: 10 mg via ORAL
  Filled 2019-04-23: qty 1

## 2019-04-23 MED ORDER — MORPHINE SULFATE (PF) 4 MG/ML IV SOLN
4.0000 mg | Freq: Once | INTRAVENOUS | Status: AC
  Administered 2019-04-23: 4 mg via INTRAVENOUS
  Filled 2019-04-23: qty 1

## 2019-04-23 MED ORDER — SODIUM CHLORIDE 0.9 % IV BOLUS
1000.0000 mL | Freq: Once | INTRAVENOUS | Status: AC
  Administered 2019-04-24: 1000 mL via INTRAVENOUS

## 2019-04-23 NOTE — ED Notes (Signed)
Patient transported to X-ray 

## 2019-04-23 NOTE — ED Provider Notes (Signed)
[facility]-EMERGENCY DEPT Provider Note   CSN: [id number] Arrival date & time: 04/23/19  1716     History Chief Complaint  Patient presents with  . Back Pain  . Emesis    Gabriel Dalton is a 33 y.o. male who presents today for evaluation of lower abdominal pain.  He was seen yesterday and discharged for abdominal pain.  At that time he was treated with IV pain and nausea medicines and given medications to take at home.  He had a CT scan that showed no acute abnormality.  He reports that since then he continues to vomit despite Zofran and his pain is uncontrolled.  He reports he is unable to count how many times he has vomited.  He states that he still feels like he needs to have a bowel movement however has been unable to.  He denies any fevers.  He does report that yesterday he was having bright red blood per rectum when attempting to have a bowel movement.  He has been seen by [facility] Dr. Lavon Paganini and had Colonoscopy and EGD on 04/20/2019 concerning for IBD.    He denies any dysuria, increased frequency or urgency.     HPI     Past Medical History:  Diagnosis Date  . Bronchitis     Patient Active Problem List   Diagnosis Date Noted  . Generalized abdominal pain 04/01/2019  . Diarrhea 04/01/2019  . Loss of weight 04/01/2019  . Screening for viral disease 04/01/2019  . Black stools 04/01/2019    Past Surgical History:  Procedure Laterality Date  . I&D EXTREMITY  10/18/2011   Procedure: IRRIGATION AND DEBRIDEMENT EXTREMITY;  Surgeon: Dominica Severin, MD;  Location: [facility] OR;  Service: Orthopedics;  Laterality: Right;  Irrigation and Debridement  Right Middle Finger; Removal of foreign body  . NO PAST SURGERIES         Family History  Problem Relation Age of Onset  . Hypertension Father   . Colon cancer Maternal Uncle   . Rectal cancer Maternal Uncle   . Brain cancer Maternal Uncle   . Heart disease Maternal Aunt   . Esophageal cancer  Neg Hx   . Stomach cancer Neg Hx     Social History   Tobacco Use  . Smoking status: Current Every Day Smoker    Packs/day: 0.50  . Smokeless tobacco: Never Used  Substance Use Topics  . Alcohol use: Yes    Comment: occasional  . Drug use: Not Currently    Home Medications Prior to Admission medications   Medication Sig Start Date End Date Taking? Authorizing Provider  Hyoscyamine Sulfate SL (LEVSIN/SL) 0.125 MG SUBL Place 0.125 mg under the tongue every 4 (four) hours as needed (abd pain). 04/23/19  Yes Muthersbaugh, Dahlia Client, PA-C  omeprazole (PRILOSEC) 20 MG capsule Take 1 capsule (20 mg total) by mouth 2 (two) times daily before a meal. 03/23/19  Yes Olive Bass, FNP  ondansetron (ZOFRAN ODT) 8 MG disintegrating tablet  ODT q4 hours prn nausea Patient taking differently: Take 8 mg by mouth every 8 (eight) hours as needed for nausea or vomiting.  ODT q4 hours prn nausea 04/23/19  Yes Muthersbaugh, Dahlia Client, PA-C    Allergies    Patient has no known allergies.  Review of Systems   Review of Systems  Constitutional: Negative for chills and fever.  Eyes: Negative for visual disturbance.  Respiratory: Negative for cough and shortness of breath.   Cardiovascular: Negative for chest pain.  Gastrointestinal: Positive for abdominal pain, nausea and vomiting.  Genitourinary: Negative for dysuria.       Denies any testicular pain  Musculoskeletal: Positive for back pain.  Skin: Negative for color change and rash.  Neurological: Negative for weakness and headaches.  Psychiatric/Behavioral: Negative for confusion.  All other systems reviewed and are negative.   Physical Exam Updated Vital Signs BP 136/85 (BP Location: Left Arm)   Pulse 75   Temp 99.2 F (37.3 C) (Oral)   Resp 18   SpO2 98%   Physical Exam Vitals and nursing note reviewed.  Constitutional:      Appearance: He is well-developed.     Comments: He appears uncomfortable.   HENT:     Head:  Normocephalic and atraumatic.  Eyes:     General: No scleral icterus.       Right eye: No discharge.        Left eye: No discharge.     Conjunctiva/sclera: Conjunctivae normal.  Cardiovascular:     Rate and Rhythm: Normal rate and regular rhythm.     Pulses: Normal pulses.     Heart sounds: Normal heart sounds.  Pulmonary:     Effort: Pulmonary effort is normal. No respiratory distress.     Breath sounds: Normal breath sounds. No stridor.  Abdominal:     General: Abdomen is flat. There is distension.     Tenderness: There is abdominal tenderness. There is guarding.  Musculoskeletal:        General: No deformity.     Cervical back: Normal range of motion.  Skin:    General: Skin is warm and dry.  Neurological:     General: No focal deficit present.     Mental Status: He is alert.     Cranial Nerves: No cranial nerve deficit.     Motor: No abnormal muscle tone.  Psychiatric:        Behavior: Behavior normal.     ED Results / Procedures / Treatments   Labs (all labs ordered are listed, but only abnormal results are displayed) Labs Reviewed  COMPREHENSIVE METABOLIC PANEL - Abnormal; Notable for the following components:      Result Value   Glucose, Bld 101 (*)    Total Protein 8.3 (*)    AST 14 (*)    All other components within normal limits  CBC WITH DIFFERENTIAL/PLATELET - Abnormal; Notable for the following components:   WBC 20.0 (*)    Hemoglobin 12.9 (*)    HCT 38.3 (*)    Neutro Abs 15.6 (*)    Monocytes Absolute 1.8 (*)    Abs Immature Granulocytes 0.14 (*)    All other components within normal limits  LIPASE, BLOOD  LACTIC ACID, PLASMA  URINALYSIS, ROUTINE W REFLEX MICROSCOPIC  RAPID URINE DRUG SCREEN, HOSP PERFORMED    EKG EKG Interpretation  Date/Time:  Saturday April 23 2019 23:43:57 EST Ventricular Rate:  67 PR Interval:    QRS Duration: 115 QT Interval:  388 QTC Calculation: 410 R Axis:   74 Text Interpretation: Sinus rhythm Nonspecific  intraventricular conduction delay Baseline wander in lead(s) V3 V4 since last tracing no significant change Confirmed by Daleen Bo [phone]) on 04/24/2019 12:06:36 AM   Radiology- CT scan obtained at previous visit.  CT ABDOMEN PELVIS W CONTRAST  Result Date: 04/22/2019 CLINICAL DATA:  Abdominal pain and rectal bleeding, history of recent endoscopy 2 days ago EXAM: CT ABDOMEN AND PELVIS WITH CONTRAST TECHNIQUE: Multidetector CT imaging of the abdomen  and pelvis was performed using the standard protocol following bolus administration of intravenous contrast. CONTRAST:  100mL OMNIPAQUE IOHEXOL 300 MG/ML  SOLN COMPARISON:  02/10/2018 FINDINGS: Lower chest: No acute abnormality. Hepatobiliary: No focal liver abnormality is seen. No gallstones, gallbladder wall thickening, or biliary dilatation. Pancreas: Unremarkable. No pancreatic ductal dilatation or surrounding inflammatory changes. Spleen: Normal in size without focal abnormality. Adrenals/Urinary Tract: Adrenal glands are within normal limits. Kidneys demonstrate a normal enhancement pattern. Bladder is partially distended. Stomach/Bowel: There is a paucity of intraperitoneal fat identified. No obstructive or inflammatory changes are seen. No areas of active extravasation are noted to suggest hemorrhage. The appendix is not well visualized. Vascular/Lymphatic: Aortic atherosclerosis. No enlarged abdominal or pelvic lymph nodes. Reproductive: Prostate is unremarkable. Other: No abdominal wall hernia or abnormality. No abdominopelvic ascites. Musculoskeletal: No acute or significant osseous findings. IMPRESSION: No acute abnormality is noted to correspond with the patient's given clinical history. Electronically Signed   By: Alcide CleverMark  Lukens M.D.   On: 04/22/2019 22:40   DG Abdomen Acute W/Chest  Result Date: 04/23/2019 CLINICAL DATA:  Abdominal pain, vomiting, constipation EXAM: DG ABDOMEN ACUTE W/ 1V CHEST COMPARISON:  CT abdomen pelvis, 04/22/2019  FINDINGS: The lungs are normally aerated. The heart and mediastinum are normal. Nonobstructive pattern of bowel gas with gas present to the distal colon. No free air in the abdomen on supine and erect views. IMPRESSION: No acute findings in the chest or abdomen. No evidence of bowel obstruction or free air. Electronically Signed   By: Lauralyn PrimesAlex  Bibbey M.D.   On: 04/23/2019 21:18    Procedures Procedures (including critical care time)  Medications Ordered in ED Medications  sodium chloride 0.9 % bolus 1,000 mL (has no administration in time range)  haloperidol lactate (HALDOL) injection 2 mg (has no administration in time range)  sodium chloride 0.9 % bolus 1,000 mL (0 mLs Intravenous Stopped 04/23/19 2357)  morphine 4 MG/ML injection 4 mg (4 mg Intravenous Given 04/23/19 2120)  ondansetron (ZOFRAN) injection 4 mg (4 mg Intravenous Given 04/23/19 2120)    ED Course  I have reviewed the triage vital signs and the nursing notes.  Pertinent labs & imaging results that were available during my care of the patient were reviewed by me and considered in my medical decision making (see chart for details).  Clinical Course as of Apr 23 104  Sat Apr 23, 2019  2257 Patient re-evaluated, pain not controlled.  Will order pain meds, consult GI for admit.    [EH]  2258 Spoke with Dr. Orvan FalconerBeavers on call for  GI.  She states his pictures from colonoscopy do not look like severe colitis.     [EH]  2338 Spoke with Patient.  He reports he uses marijuana on a daily basis, last use was today.  He states no one is ever discussed cannabinoid hyperemesis syndrome with him before.  He does note that 2 or 3 days ago he took a warm bath which caused him to feel slightly better.Order placed for EKG and then will attempt to treat with IV Haldol.   [EH]    Clinical Course User Index [EH] Cristina GongHammond, Cassanda Walmer W, PA-C   MDM Rules/Calculators/A&P                    Patient presents seen last night and discharge  calcium pain medicine.  At that point his CT scan was normal.  He has previously seen a EGD and colonoscopy within the past week.  He  reports that since his discharge, despite taking his prescribed nausea medicine he has been unable to keep down.  Labs were obtained and reviewed, his white count has up to 20 from yesterday.  CBC and CMP without significant derangements.  Lipase is not elevated.  Lactic acid is not elevated.  I spoke with on-call Dr. Orvan Falconer for [facility] who reports that the pictures from his colonoscopy do not appear to be severe.  On chart review patient has not had a UDS performed.  I spoke with patient and he admits to daily cannabis use.  We discussed cannabinoid hyperemesis syndrome.  EKG was ordered to evaluate QTC which was normal.  IV Haldol is ordered in addition to IV fluids.  Suspect that his symptoms are primarily related to cannabinoid hyperemesis and we discussed that he must stop all use.    At shift change care was transferred to [facility] who will follow pending studies, re-evaulate and determine disposition.     Final Clinical Impression(s) / ED Diagnoses Final diagnoses:  Cannabinoid hyperemesis syndrome  Lower abdominal pain    Rx / DC Orders ED Discharge Orders    None       Cristina Gong, PA-C 04/24/19 0120    Mancel Bale, MD 04/24/19 1038

## 2019-04-23 NOTE — ED Provider Notes (Signed)
Care assumed from [facility], [place].  Please see her full H&P.  In short,  Gabriel Dalton is a 33 y.o. male presents for increased abdominal pain several days after colonoscopy and endoscopy.  Patient had procedures on 04/20/2019 by Dr. Lavon Paganini.  He reports feeling well on Thursday and eating a normal diet but awoke this morning with lower abdominal pain and persistent emesis throughout the day.  Emesis was nonbloody and nonbilious however he does report bright red blood per rectum when attempting to have a bowel movement.  Patient reports his abdominal pain is similar to previous flares of abdominal pain.  He reports bright red blood per rectum is new.  Pt reports he is not passing gas and has been unable to have a bowel movement.   Records reviewed.  There is a note in the chart from Dr. Lavon Paganini yesterday that advised ER visit if symptoms worsened.  Procedure was suspicious for Crohn's and they will plan to start treating for inflammatory bowel disease once it is confirmed on the biopsies.  Also noted patient should take his prescription for hydrocodone and Zofran at home.  Patient reports he does not have this medication at home.  [place] narcotic database reviewed and last prescription for hydrocodone was 02/2018.   Physical Exam  BP 139/90   Pulse 95   Temp 98.9 F (37.2 C) (Oral)   Resp 16   SpO2 99%   Physical Exam Vitals and nursing note reviewed. Exam conducted with a chaperone present.  Constitutional:      General: He is not in acute distress.    Appearance: He is well-developed.  HENT:     Head: Normocephalic.  Eyes:     General: No scleral icterus.    Conjunctiva/sclera: Conjunctivae normal.  Cardiovascular:     Rate and Rhythm: Normal rate.  Pulmonary:     Effort: Pulmonary effort is normal.  Abdominal:     Tenderness: There is abdominal tenderness in the right lower quadrant, suprapubic area and left lower quadrant. There is guarding. There is no right CVA  tenderness, left CVA tenderness or rebound.     Hernia: There is no hernia in the left inguinal area or right inguinal area.  Genitourinary:    Penis: Normal.      Testes: Normal.  Musculoskeletal:        General: Normal range of motion.     Cervical back: Normal range of motion.  Lymphadenopathy:     Lower Body: No right inguinal adenopathy. No left inguinal adenopathy.  Skin:    General: Skin is warm and dry.  Neurological:     Mental Status: He is alert.     ED Course/Procedures   Clinical Course as of Apr 23 131  Sat Apr 23, 2019  0000 Plan: Reassess pain and PO trial.   [HM]  0046 Pt has tolerated oral fluids without vomiting.  He has not attempted solids.  He reports pain is a 4/10 but worsens when he moves around.     [HM]  0048 Slightly elevated  WBC(!): 12.6 [HM]  0130 Pt reports he is feeling much better after bentyl and simethicone.  Abd soft with minimal tenderness and no guarding on exam.     [HM]    Clinical Course User Index [HM] Cashe Gatt, Dahlia Client, PA-C    Procedures  MDM   Patient presents with generalized abdominal pain, worst in the lower quadrants 2 days after endoscopy and colonoscopy.  Findings of colonoscopy showed ulcers around  the rectum likely leading to patient's feeling for need of bowel movement.  Also likely the source of his bright red blood per rectum.  He is well-appearing without anemia.  No tachycardia or fever.  Labs are reassuring.  CT scan is without evidence of bowel perforation or free air.  No evidence of sirs or sepsis.  Pain well controlled and patient has tolerated fluids without difficulty.  Will discharge to home with close follow-up with gastroenterology.  Discussed reasons to return to the emergency department.  Patient and significant other state understanding and are in agreement with the plan.    Generalized abdominal pain        Agapito Games 04/23/19 0132    Orpah Greek, MD 04/23/19  [phone]

## 2019-04-23 NOTE — ED Triage Notes (Signed)
Pt reports that medications he was given yesterday here for abd pains and vomiting arent helping. Now having lower back pains.

## 2019-04-24 LAB — URINALYSIS, ROUTINE W REFLEX MICROSCOPIC
Bilirubin Urine: NEGATIVE
Glucose, UA: NEGATIVE mg/dL
Hgb urine dipstick: NEGATIVE
Ketones, ur: 20 mg/dL — AB
Leukocytes,Ua: NEGATIVE
Nitrite: NEGATIVE
Protein, ur: NEGATIVE mg/dL
Specific Gravity, Urine: 1.019 (ref 1.005–1.030)
pH: 5 (ref 5.0–8.0)

## 2019-04-24 LAB — RAPID URINE DRUG SCREEN, HOSP PERFORMED
Amphetamines: NOT DETECTED
Barbiturates: NOT DETECTED
Benzodiazepines: NOT DETECTED
Cocaine: NOT DETECTED
Opiates: POSITIVE — AB
Tetrahydrocannabinol: POSITIVE — AB

## 2019-04-24 MED ORDER — HYDROMORPHONE HCL 1 MG/ML IJ SOLN
INTRAMUSCULAR | Status: AC
  Filled 2019-04-24: qty 1

## 2019-04-24 MED ORDER — METOCLOPRAMIDE HCL 10 MG PO TABS: 10.0000 mg | ORAL_TABLET | Freq: Four times a day (QID) | ORAL | 0 refills | Status: DC

## 2019-04-24 MED ORDER — CAPSAICIN 0.025 % EX CREA
TOPICAL_CREAM | Freq: Once | CUTANEOUS | Status: AC
  Administered 2019-04-24: 03:00:00 via TOPICAL
  Filled 2019-04-24: qty 60

## 2019-04-24 NOTE — ED Notes (Signed)
PT able to hold down drink

## 2019-04-24 NOTE — ED Provider Notes (Signed)
Abdominal pain, intermittent Recent scopes (04/20/19)  but has had CT evaluation since without acute finding Likely cannabinoid hyperemesis   Haldol, capsaicin  ordered. Morphine/zofran given here with relief. Has gotten fluids.   GI consulted - no further recommendations  Plan:  Reassess after additional medications. No vomiting in ED. Biggest complaint is abdominal pain.  3:00 - Patient reports he is feeling improved. No pain or nausea at present. PO challenge, ginger ale, offered.   3:40 - He continues to be asymptomatic, without vomiting, after PO challenge. Will add Reglan to medication regimen. He is comfortable with discharge home.    Charlann Lange, PA-C 04/24/19 2706    Daleen Bo, MD 04/24/19 1038

## 2019-04-24 NOTE — Discharge Instructions (Addendum)
Use Reglan and/or Zofran for control of nausea. Please make an appointment with your doctor for recheck.   You are welcome to return here anytime your symptoms are not controlled at home or if you shows signs of worsening condition.

## 2019-04-24 NOTE — ED Notes (Signed)
Capsaicin not found pharmacy called

## 2019-04-25 ENCOUNTER — Other Ambulatory Visit: Payer: Self-pay

## 2019-04-25 ENCOUNTER — Encounter (HOSPITAL_COMMUNITY): Payer: Self-pay

## 2019-04-25 ENCOUNTER — Observation Stay (HOSPITAL_COMMUNITY)
Admission: EM | Admit: 2019-04-25 | Discharge: 2019-04-25 | Discharge status: Against Medical Advice | Payer: PRIVATE HEALTH INSURANCE | Attending: Internal Medicine | Admitting: Internal Medicine

## 2019-04-25 ENCOUNTER — Observation Stay (HOSPITAL_COMMUNITY): Payer: PRIVATE HEALTH INSURANCE

## 2019-04-25 DIAGNOSIS — I498 Other specified cardiac arrhythmias: Secondary | ICD-10-CM | POA: Insufficient documentation

## 2019-04-25 DIAGNOSIS — K626 Ulcer of anus and rectum: Secondary | ICD-10-CM | POA: Insufficient documentation

## 2019-04-25 DIAGNOSIS — F17210 Nicotine dependence, cigarettes, uncomplicated: Secondary | ICD-10-CM | POA: Insufficient documentation

## 2019-04-25 DIAGNOSIS — R634 Abnormal weight loss: Secondary | ICD-10-CM | POA: Insufficient documentation

## 2019-04-25 DIAGNOSIS — R112 Nausea with vomiting, unspecified: Secondary | ICD-10-CM

## 2019-04-25 DIAGNOSIS — R109 Unspecified abdominal pain: Secondary | ICD-10-CM | POA: Diagnosis present

## 2019-04-25 DIAGNOSIS — K5900 Constipation, unspecified: Secondary | ICD-10-CM | POA: Insufficient documentation

## 2019-04-25 DIAGNOSIS — R1030 Lower abdominal pain, unspecified: Principal | ICD-10-CM | POA: Insufficient documentation

## 2019-04-25 DIAGNOSIS — Z20828 Contact with and (suspected) exposure to other viral communicable diseases: Secondary | ICD-10-CM | POA: Insufficient documentation

## 2019-04-25 DIAGNOSIS — I4510 Unspecified right bundle-branch block: Secondary | ICD-10-CM | POA: Insufficient documentation

## 2019-04-25 DIAGNOSIS — K254 Chronic or unspecified gastric ulcer with hemorrhage: Secondary | ICD-10-CM | POA: Insufficient documentation

## 2019-04-25 DIAGNOSIS — K921 Melena: Secondary | ICD-10-CM

## 2019-04-25 DIAGNOSIS — D62 Acute posthemorrhagic anemia: Secondary | ICD-10-CM | POA: Insufficient documentation

## 2019-04-25 DIAGNOSIS — D72829 Elevated white blood cell count, unspecified: Secondary | ICD-10-CM | POA: Diagnosis present

## 2019-04-25 DIAGNOSIS — D649 Anemia, unspecified: Secondary | ICD-10-CM | POA: Diagnosis present

## 2019-04-25 DIAGNOSIS — Z79899 Other long term (current) drug therapy: Secondary | ICD-10-CM | POA: Insufficient documentation

## 2019-04-25 DIAGNOSIS — K259 Gastric ulcer, unspecified as acute or chronic, without hemorrhage or perforation: Secondary | ICD-10-CM | POA: Diagnosis present

## 2019-04-25 HISTORY — DX: Melena: K92.1

## 2019-04-25 LAB — CBC WITH DIFFERENTIAL/PLATELET
Abs Immature Granulocytes: 0.05 10*3/uL (ref 0.00–0.07)
Basophils Absolute: 0 10*3/uL (ref 0.0–0.1)
Basophils Relative: 0 %
Eosinophils Absolute: 0 10*3/uL (ref 0.0–0.5)
Eosinophils Relative: 0 %
HCT: 36.2 % — ABNORMAL LOW (ref 39.0–52.0)
Hemoglobin: 12.3 g/dL — ABNORMAL LOW (ref 13.0–17.0)
Immature Granulocytes: 0 %
Lymphocytes Relative: 19 %
Lymphs Abs: 2.7 10*3/uL (ref 0.7–4.0)
MCH: 29.9 pg (ref 26.0–34.0)
MCHC: 34 g/dL (ref 30.0–36.0)
MCV: 87.9 fL (ref 80.0–100.0)
Monocytes Absolute: 1.3 10*3/uL — ABNORMAL HIGH (ref 0.1–1.0)
Monocytes Relative: 9 %
Neutro Abs: 10.3 10*3/uL — ABNORMAL HIGH (ref 1.7–7.7)
Neutrophils Relative %: 72 %
Platelets: 325 10*3/uL (ref 150–400)
RBC: 4.12 MIL/uL — ABNORMAL LOW (ref 4.22–5.81)
RDW: 13.8 % (ref 11.5–15.5)
WBC: 14.5 10*3/uL — ABNORMAL HIGH (ref 4.0–10.5)
nRBC: 0 % (ref 0.0–0.2)

## 2019-04-25 LAB — MAGNESIUM: Magnesium: 2 mg/dL (ref 1.7–2.4)

## 2019-04-25 LAB — COMPREHENSIVE METABOLIC PANEL
ALT: 17 U/L (ref 0–44)
AST: 19 U/L (ref 15–41)
Albumin: 3.9 g/dL (ref 3.5–5.0)
Alkaline Phosphatase: 70 U/L (ref 38–126)
Anion gap: 13 (ref 5–15)
BUN: 11 mg/dL (ref 6–20)
CO2: 25 mmol/L (ref 22–32)
Calcium: 9.3 mg/dL (ref 8.9–10.3)
Chloride: 98 mmol/L (ref 98–111)
Creatinine, Ser: 0.79 mg/dL (ref 0.61–1.24)
GFR calc Af Amer: >60 mL/min (ref >60)
GFR calc non Af Amer: >60 mL/min (ref >60)
Glucose, Bld: 83 mg/dL (ref 70–99)
Potassium: 4.1 mmol/L (ref 3.5–5.1)
Sodium: 136 mmol/L (ref 135–145)
Total Bilirubin: 1.4 mg/dL — ABNORMAL HIGH (ref 0.3–1.2)
Total Protein: 7.7 g/dL (ref 6.5–8.1)

## 2019-04-25 LAB — URINALYSIS, ROUTINE W REFLEX MICROSCOPIC
Bilirubin Urine: NEGATIVE
Glucose, UA: NEGATIVE mg/dL
Ketones, ur: 20 mg/dL — AB
Leukocytes,Ua: NEGATIVE
Nitrite: NEGATIVE
Protein, ur: NEGATIVE mg/dL
Specific Gravity, Urine: 1.006 (ref 1.005–1.030)
pH: 6 (ref 5.0–8.0)

## 2019-04-25 LAB — OCCULT BLOOD X 1 CARD TO LAB, STOOL: Fecal Occult Bld: NEGATIVE

## 2019-04-25 LAB — PHOSPHORUS: Phosphorus: 2.9 mg/dL (ref 2.5–4.6)

## 2019-04-25 LAB — SARS CORONAVIRUS 2 (TAT 6-24 HRS): SARS Coronavirus 2: NEGATIVE

## 2019-04-25 MED ORDER — METHYLPREDNISOLONE SODIUM SUCC 40 MG IJ SOLR: 20.0000 mg | Freq: Three times a day (TID) | INTRAMUSCULAR | Status: DC

## 2019-04-25 MED ORDER — DICYCLOMINE HCL 10 MG PO CAPS: 10.0000 mg | ORAL_CAPSULE | Freq: Four times a day (QID) | ORAL | Status: DC | PRN

## 2019-04-25 MED ORDER — HALOPERIDOL LACTATE 5 MG/ML IJ SOLN
5.0000 mg | Freq: Once | INTRAMUSCULAR | Status: AC
  Administered 2019-04-25: 06:00:00 5 mg via INTRAVENOUS
  Filled 2019-04-25: qty 1

## 2019-04-25 MED ORDER — FENTANYL CITRATE (PF) 100 MCG/2ML IJ SOLN: 50.0000 ug | INTRAMUSCULAR | Status: DC | PRN

## 2019-04-25 MED ORDER — SODIUM CHLORIDE 0.9 % IV SOLN: INTRAVENOUS | Status: DC

## 2019-04-25 MED ORDER — FENTANYL CITRATE (PF) 100 MCG/2ML IJ SOLN
100.0000 ug | Freq: Once | INTRAMUSCULAR | Status: AC
  Administered 2019-04-25: 100 ug via INTRAVENOUS
  Filled 2019-04-25: qty 2

## 2019-04-25 MED ORDER — ONDANSETRON HCL 4 MG/2ML IJ SOLN: 4.0000 mg | Freq: Four times a day (QID) | INTRAMUSCULAR | Status: DC

## 2019-04-25 MED ORDER — PANTOPRAZOLE SODIUM 40 MG IV SOLR: 40.0000 mg | Freq: Two times a day (BID) | INTRAVENOUS | Status: DC

## 2019-04-25 MED ORDER — PANTOPRAZOLE SODIUM 40 MG IV SOLR
40.0000 mg | Freq: Once | INTRAVENOUS | Status: AC
  Administered 2019-04-25: 08:00:00 40 mg via INTRAVENOUS
  Filled 2019-04-25: qty 40

## 2019-04-25 MED ORDER — PROCHLORPERAZINE EDISYLATE 10 MG/2ML IJ SOLN: 5.0000 mg | INTRAMUSCULAR | Status: DC | PRN

## 2019-04-25 MED ORDER — SODIUM CHLORIDE 0.9 % IV BOLUS (SEPSIS)
1000.0000 mL | Freq: Once | INTRAVENOUS | Status: AC
  Administered 2019-04-25: 06:00:00 1000 mL via INTRAVENOUS

## 2019-04-25 MED ORDER — HYDROCORTISONE ACETATE 25 MG RE SUPP
25.0000 mg | Freq: Two times a day (BID) | RECTAL | Status: DC
  Administered 2019-04-25: 11:00:00 25 mg via RECTAL
  Filled 2019-04-25 (×2): qty 1

## 2019-04-25 NOTE — ED Notes (Signed)
This RN called to room by pt.  Pt reporting "I feel much better, Im ready to leave, Im getting restless."  Pt is able to ambulate independently, reports being able to use the bathroom with no issues. Admitting provider, MD Glori Luis to make aware.

## 2019-04-25 NOTE — H&P (Signed)
History and Physical    Gabriel Dalton MOQ:[id number] DOB: [birth year] DOA: 04/25/2019  PCP: Marrian Salvage, FNP   Patient coming from: Home.  I have personally briefly reviewed patient's old medical records in [place]  Chief Complaint: Abdominal pain.  HPI: Gabriel Dalton is a 33 y.o. male with medical history significant of bronchitis who recently underwent bidirectional GI endoscopic studies for chronic abdominal pain with weight loss and failure to try of which has gotten progressively worse since last year.  The patient states that on Wednesday he had an EGD and colonoscopy with Dr. Silverio Decamp.  The procedures were uneventful.  He did not have any symptoms on Thursday and his last meal was on the day later in the morning, when he ate a cheeseburger and fries.  Then since Friday early morning, the patient has been having abdominal pain, numerous episodes of emesis on Friday and Saturday, another 3 episodes of nausea/emesis yesterday.  He came to the ER on Friday evening, was discharged home, then return on Saturday evening as well.  His symptoms have again exacerbated today early in the morning.  He has been using omeprazole, metoclopramide and ondansetron at home without significant relief.  He recently stopped using cannabis.  He has been constipated since Wednesday, except for a small dark/tarry looking stool BM yesterday.  He has not being able to eat anything. He denies fever, but complains of chills and fatigue.  No dysuria, flank pain, frequency or hematuria.  He denies CP, palpitations, dizziness, dyspnea, PND, orthopnea or pitting edema of the lower extremities.  ED Course: Initial vital signs pulse 85, respirations 17, blood pressure 125/87 mmHg O2 sat 99% on room air.  Patient received a 1 L of NS bolus, 5 mg of haloperidol IVP and 100 mcg of fentanyl IVP.  He is urinalysis shows small hemoglobinuria, ketonuria 20 mg/dL and rare bacteria on microscopic  examination.  All other urinalysis results are normal.  CBC shows a white count of 14.5, hemoglobin 12.3 g/dL and platelets 325.  CMP shows a total bilirubin of 1.4 mg/dL, all other values are within expected range.  Magnesium and phosphorus were normal.CT abdomen/pelvis done on 04/22/2019 was normal.  On 04/23/2019 abdominal radiographs were also unremarkable.  Review of Systems: As per HPI otherwise 10 point review of systems negative.   Past Medical History:  Diagnosis Date  . Bronchitis     Past Surgical History:  Procedure Laterality Date  . I&D EXTREMITY  10/18/2011   Procedure: IRRIGATION AND DEBRIDEMENT EXTREMITY;  Surgeon: Roseanne Kaufman, MD;  Location: [facility];  Service: Orthopedics;  Laterality: Right;  Irrigation and Debridement  Right Middle Finger; Removal of foreign body  . NO PAST SURGERIES       reports that he has been smoking. He has been smoking about 0.50 packs per day. He has never used smokeless tobacco. He reports current alcohol use. He reports previous drug use.  No Known Allergies  Family History  Problem Relation Age of Onset  . Hypertension Father   . Colon cancer Maternal Uncle   . Rectal cancer Maternal Uncle   . Brain cancer Maternal Uncle   . Heart disease Maternal Aunt   . Esophageal cancer Neg Hx   . Stomach cancer Neg Hx    Prior to Admission medications   Medication Sig Start Date End Date Taking? Authorizing Provider  Hyoscyamine Sulfate SL (LEVSIN/SL) 0.125 MG SUBL Place 0.125 mg under the tongue every 4 (four) hours as needed (abd  pain). 04/23/19  Yes Muthersbaugh, Dahlia ClientHannah, PA-C  metoCLOPramide (REGLAN) 10 MG tablet Take 1 tablet (10 mg total) by mouth every 6 (six) hours. Patient taking differently: Take 10 mg by mouth every 6 (six) hours as needed for nausea or vomiting.  04/24/19  Yes Upstill, Melvenia BeamShari, PA-C  omeprazole (PRILOSEC) 20 MG capsule Take 1 capsule (20 mg total) by mouth 2 (two) times daily before a meal. 03/23/19  Yes Olive BassMurray, Laura  Woodruff, FNP  ondansetron (ZOFRAN ODT) 8 MG disintegrating tablet 8mg  ODT q4 hours prn nausea Patient taking differently: Take 8 mg by mouth every 8 (eight) hours as needed for nausea or vomiting. 8mg  ODT q4 hours prn nausea 04/23/19  Yes Muthersbaugh, Boyd KerbsHannah, PA-C    Physical Exam: Vitals:   04/25/19 0534 04/25/19 0615 04/25/19 0630 04/25/19 0700  BP: 137/80 133/75 119/76 119/76  Pulse: 66 (!) 58 64 67  Resp:  20 20 17   TempSrc:      SpO2: 100% 100% 100% 100%    Constitutional: NAD, calm, comfortable Eyes: PERRL, lids and conjunctivae normal ENMT: Mucous membranes are mildly dry. Posterior pharynx clear of any exudate or lesions. Neck: supple, no masses, no thyromegaly Respiratory: clear to auscultation bilaterally, no wheezing, no crackles. Normal respiratory effort. No accessory muscle use.  Cardiovascular: Regular rate with frequent extrasystoles, no murmurs / rubs / gallops. No extremity edema. 2+ pedal pulses. No carotid bruits.  Abdomen: Nondistended. Bowel sounds positive.  Soft, positive for gastric tenderness, no guarding, rebound or masses palpated. No hepatosplenomegaly.  Musculoskeletal: no clubbing / cyanosis. Good ROM, no contractures. Normal muscle tone.  Skin: no rashes, lesions, ulcers. No induration Neurologic: CN 2-12 grossly intact. Sensation intact, DTR normal. Strength 5/5 in all 4.  Psychiatric: Normal judgment and insight. Alert and oriented x 3. Normal mood.   Labs on Admission: I have personally reviewed following labs and imaging studies  CBC: Recent Labs  Lab 04/22/19 2005 04/23/19 2005 04/25/19 0613  WBC 12.6* 20.0* 14.5*  NEUTROABS  --  15.6* 10.3*  HGB 13.7 12.9* 12.3*  HCT 40.8 38.3* 36.2*  MCV 88.7 89.1 87.9  PLT 371 324 325   Basic Metabolic Panel: Recent Labs  Lab 04/22/19 2005 04/23/19 2005 04/25/19 0613  NA 140 136 136  K 3.8 3.8 4.1  CL 103 98 98  CO2 26 27 25   GLUCOSE 115* 101* 83  BUN 9 11 11   CREATININE 0.80 0.80 0.79    CALCIUM 9.7 9.5 9.3   GFR: Estimated Creatinine Clearance: 114.8 mL/min (by C-G formula based on SCr of 0.79 mg/dL). Liver Function Tests: Recent Labs  Lab 04/22/19 2005 04/23/19 2005 04/25/19 0613  AST 16 14* 19  ALT 14 13 17   ALKPHOS 81 81 70  BILITOT 1.1 1.2 1.4*  PROT 8.4* 8.3* 7.7  ALBUMIN 4.7 4.4 3.9   Recent Labs  Lab 04/22/19 2005 04/23/19 2005  LIPASE 24 20   No results for input(s): AMMONIA in the last 168 hours. Coagulation Profile: No results for input(s): INR, PROTIME in the last 168 hours. Cardiac Enzymes: No results for input(s): CKTOTAL, CKMB, CKMBINDEX, TROPONINI in the last 168 hours. BNP (last 3 results) No results for input(s): PROBNP in the last 8760 hours. HbA1C: No results for input(s): HGBA1C in the last 72 hours. CBG: No results for input(s): GLUCAP in the last 168 hours. Lipid Profile: No results for input(s): CHOL, HDL, LDLCALC, TRIG, CHOLHDL, LDLDIRECT in the last 72 hours. Thyroid Function Tests: No results for input(s): TSH, T4TOTAL,  FREET4, T3FREE, THYROIDAB in the last 72 hours. Anemia Panel: No results for input(s): VITAMINB12, FOLATE, FERRITIN, TIBC, IRON, RETICCTPCT in the last 72 hours. Urine analysis:    Component Value Date/Time   COLORURINE YELLOW 04/23/2019 2057   APPEARANCEUR CLEAR 04/23/2019 2057   LABSPEC 1.019 04/23/2019 2057   PHURINE 5.0 04/23/2019 2057   GLUCOSEU NEGATIVE 04/23/2019 2057   HGBUR NEGATIVE 04/23/2019 2057   BILIRUBINUR NEGATIVE 04/23/2019 2057   KETONESUR 20 (A) 04/23/2019 2057   PROTEINUR NEGATIVE 04/23/2019 2057   UROBILINOGEN 0.2 10/10/2009 1411   NITRITE NEGATIVE 04/23/2019 2057   LEUKOCYTESUR NEGATIVE 04/23/2019 2057    Radiological Exams on Admission: DG Abdomen Acute W/Chest  Result Date: 04/23/2019 CLINICAL DATA:  Abdominal pain, vomiting, constipation EXAM: DG ABDOMEN ACUTE W/ 1V CHEST COMPARISON:  CT abdomen pelvis, 04/22/2019 FINDINGS: The lungs are normally aerated. The heart and  mediastinum are normal. Nonobstructive pattern of bowel gas with gas present to the distal colon. No free air in the abdomen on supine and erect views. IMPRESSION: No acute findings in the chest or abdomen. No evidence of bowel obstruction or free air. Electronically Signed   By: Lauralyn Primes M.D.   On: 04/23/2019 21:18   EGD findings:  - The esophagus was normal. - A small hiatal hernia was present. - Localized thick gastric folds with hemorrhage were found in the gastric antrum and in the prepyloric region of the stomach. Biopsies were taken with a cold forceps for histology. - Few non-bleeding superficial gastric ulcers were found in the prepyloric region of the stomach. - Diffuse moderate inflammation with hemorrhage characterized by congestion (edema), erythema, friability and granularity was found in the entire examined stomach. Biopsies were taken with a cold forceps for Helicobacter pylori testing. - The examined duodenum was normal. Biopsies for histology were taken with a cold forceps for evaluation of celiac disease.  Colonoscopy findings:  - The perianal and digital rectal examinations were normal. - A patchy area of mucosa in the terminal ileum was mildly friable. Biopsies were taken with a cold forceps for histology. - Discontinuous areas of nonbleeding ulcerated mucosa were present in the rectum, in the sigmoid colon and in the transverse colon. Biopsies were taken with a cold forceps for histology.   EKG: Independently reviewed.  Sinus arrhythmia Incomplete RBBB Early red bloody cessation, may be normal variant. Conduction changes at the same since 2000  Assessment/Plan Principal Problem:   Intractable abdominal pain Observation/telemetry. Continue IV fluids. Clear liquid diet. Monitor H&H every 6 hours. Continue Protonix every 12 hours. Analgesics as needed. Antiemetics as needed. GI will evaluate. Recently stop using cannabis. (Cannabis hyperemesis  syndrome?)  Active Problems:   Gastric ulcers   Melena Clear liquid diet. Protonix 40 mg IVP every 24 hours. Consult GI. Biopsy/H. pylori still pending.    Ulcerated mucosa of rectum Patient also feels constipated. Will try Anusol suppositories. Follow-up biopsy report.    Hyperbilirubinemia Likely due to volume depletion. Follow-up bilirubin level.    Acute blood loss anemia Due to recent episodes of melena. Anemia panel to check for micronutrient deficiency. Monitor H&H.    Abnormal EKG Again shows previously known RBBB. No chest pain at this time. Smoking cessation recommended. Briefly with discussed with cardiology. Repeat EKG in a.m. Check echocardiogram to R/O structural cardiac disease. Follow-up with cardiology as an outpatient.    Leukocytosis No fever, dyspnea, diarrhea. Likely due to acute stress/volume depletion. Monitor clinically and follow WBC.     DVT prophylaxis: SCDs.  Code Status:  Full code. Family Communication: Disposition Plan: Observation for IV hydration, symptoms treatment and GI evaluation. Consults called: Amy Esterwood, PAC-[facility]. Admission status: Observation/telemetry.   Bobette Mo MD [facility]  If 7PM-7AM, please contact night-coverage [email or web address]  04/25/2019, 7:30 AM   This document was prepared using Dragon voice recognition software and may contain some unintended transcription errors.

## 2019-04-25 NOTE — ED Provider Notes (Signed)
[facility]-EMERGENCY DEPT Provider Note   CSN: [id number] Arrival date & time: 04/25/19  0353     History Chief Complaint  Patient presents with  . Abdominal Pain    Gabriel Dalton is a 33 y.o. male.  The history is provided by the patient and the spouse.  Abdominal Pain Pain location:  Generalized Pain quality: pressure   Pain severity:  Severe Onset quality:  Sudden Timing:  Constant Progression:  Worsening Chronicity:  Recurrent Relieved by:  Nothing Worsened by:  Movement and palpation Associated symptoms: nausea and vomiting   Associated symptoms: no fever    Patient presents for recurrent abdominal pain and vomiting.  Patient has had multiple ER evaluations previously. Patient has had a recent work-up by gastroenterology including EGD and colonoscopy Since the procedures his vomiting and abdominal pain have worsened.  He has had 2 ER visits since then.  He reports unable to take any p.o. fluids or meals   He reports he just quit smoking marijuana Past Medical History:  Diagnosis Date  . Bronchitis     Patient Active Problem List   Diagnosis Date Noted  . Generalized abdominal pain 04/01/2019  . Diarrhea 04/01/2019  . Loss of weight 04/01/2019  . Screening for viral disease 04/01/2019  . Black stools 04/01/2019    Past Surgical History:  Procedure Laterality Date  . I&D EXTREMITY  10/18/2011   Procedure: IRRIGATION AND DEBRIDEMENT EXTREMITY;  Surgeon: Dominica Severin, MD;  Location: [facility] OR;  Service: Orthopedics;  Laterality: Right;  Irrigation and Debridement  Right Middle Finger; Removal of foreign body  . NO PAST SURGERIES         Family History  Problem Relation Age of Onset  . Hypertension Father   . Colon cancer Maternal Uncle   . Rectal cancer Maternal Uncle   . Brain cancer Maternal Uncle   . Heart disease Maternal Aunt   . Esophageal cancer Neg Hx   . Stomach cancer Neg Hx     Social History   Tobacco Use  .  Smoking status: Current Every Day Smoker    Packs/day: 0.50  . Smokeless tobacco: Never Used  Substance Use Topics  . Alcohol use: Yes    Comment: occasional  . Drug use: Not Currently    Home Medications Prior to Admission medications   Medication Sig Start Date End Date Taking? Authorizing Provider  Hyoscyamine Sulfate SL (LEVSIN/SL) 0.125 MG SUBL Place 0.125 mg under the tongue every 4 (four) hours as needed (abd pain). 04/23/19  Yes Muthersbaugh, Dahlia Client, PA-C  metoCLOPramide (REGLAN) 10 MG tablet Take 1 tablet (10 mg total) by mouth every 6 (six) hours. Patient taking differently: Take 10 mg by mouth every 6 (six) hours as needed for nausea or vomiting.  04/24/19  Yes Upstill, Melvenia Beam, PA-C  omeprazole (PRILOSEC) 20 MG capsule Take 1 capsule (20 mg total) by mouth 2 (two) times daily before a meal. 03/23/19  Yes Olive Bass, FNP  ondansetron (ZOFRAN ODT) 8 MG disintegrating tablet 8mg  ODT q4 hours prn nausea Patient taking differently: Take 8 mg by mouth every 8 (eight) hours as needed for nausea or vomiting. 8mg  ODT q4 hours prn nausea 04/23/19  Yes Muthersbaugh, , PA-C    Allergies    Patient has no known allergies.  Review of Systems   Review of Systems  Constitutional: Negative for fever.  Gastrointestinal: Positive for abdominal pain, nausea and vomiting.  All other systems reviewed and are negative.   Physical  Exam Updated Vital Signs BP 137/80   Pulse 66   Resp 17   SpO2 100%   Physical Exam CONSTITUTIONAL: Well developed/well nourished, uncomfortable. HEAD: Normocephalic/atraumatic EYES: EOMI/PERRL, no icterus ENMT: Mucous membranes moist NECK: supple no meningeal signs SPINE/BACK:entire spine nontender CV: S1/S2 noted, no murmurs/rubs/gallops noted LUNGS: Lungs are clear to auscultation bilaterally, no apparent distress ABDOMEN: soft, diffuse moderate tenderness, no rebound or guarding, bowel sounds noted throughout abdomen GU:no cva  tenderness NEURO: Pt is awake/alert/appropriate, moves all extremitiesx4.  No facial droop.   EXTREMITIES: pulses normal/equal, full ROM SKIN: warm, color normal PSYCH: no abnormalities of mood noted, alert and oriented to situation  ED Results / Procedures / Treatments   Labs (all labs ordered are listed, but only abnormal results are displayed) Labs Reviewed  CBC WITH DIFFERENTIAL/PLATELET - Abnormal; Notable for the following components:      Result Value   WBC 14.5 (*)    RBC 4.12 (*)    Hemoglobin 12.3 (*)    HCT 36.2 (*)    Neutro Abs 10.3 (*)    Monocytes Absolute 1.3 (*)    All other components within normal limits  COMPREHENSIVE METABOLIC PANEL - Abnormal; Notable for the following components:   Total Bilirubin 1.4 (*)    All other components within normal limits  SARS CORONAVIRUS 2 (TAT 6-24 HRS)    EKG  ED ECG REPORT   Date: 04/25/2019 0614  Rate: 62  Rhythm: normal sinus rhythm  QRS Axis: normal  Intervals: normal  ST/T Wave abnormalities: nonspecific ST changes  Conduction Disutrbances:nonspecific intraventricular conduction delay  Narrative Interpretation:   Old EKG Reviewed: unchanged  I have personally reviewed the EKG tracing and agree with the computerized printout as noted.  Radiology DG Abdomen Acute W/Chest  Result Date: 04/23/2019 CLINICAL DATA:  Abdominal pain, vomiting, constipation EXAM: DG ABDOMEN ACUTE W/ 1V CHEST COMPARISON:  CT abdomen pelvis, 04/22/2019 FINDINGS: The lungs are normally aerated. The heart and mediastinum are normal. Nonobstructive pattern of bowel gas with gas present to the distal colon. No free air in the abdomen on supine and erect views. IMPRESSION: No acute findings in the chest or abdomen. No evidence of bowel obstruction or free air. Electronically Signed   By: Eddie Candle M.D.   On: 04/23/2019 21:18    Procedures Procedures    Medications Ordered in ED Medications  fentaNYL (SUBLIMAZE) injection 100 mcg (has no  administration in time range)  sodium chloride 0.9 % bolus 1,000 mL (1,000 mLs Intravenous New Bag/Given 04/25/19 0612)  haloperidol lactate (HALDOL) injection 5 mg (5 mg Intravenous Given 04/25/19 0608)    ED Course  I have reviewed the triage vital signs and the nursing notes.  Pertinent labs & imaging results that were available during my care of the patient were reviewed by me and considered in my medical decision making (see chart for details).    MDM Rules/Calculators/A&P                        6:26 AM Patient with recurrent episodes of abdominal pain.  He has had outpatient work-up by gastroenterology including EGD and colonoscopy on December 9.  He has had 2 ER visits since that time with a negative acute abdominal series and negative CT scan His pain and vomiting have continued. Labs are pending at this time 7:11 AM Pt with continued abdominal pain and nausea.  Will admit due to multiple ER evaluations and unable to take  PO  7:16 AM D/w dr Robb Matarortiz for admission  Final Clinical Impression(s) / ED Diagnoses Final diagnoses:  Intractable abdominal pain  Intractable nausea and vomiting    Rx / DC Orders ED Discharge Orders    None       Zadie RhineWickline, Oralee Rapaport, MD 04/25/19 [id number]

## 2019-04-25 NOTE — ED Notes (Addendum)
Pt no longer in room, not visualized on unit.  Admitting provider made aware.

## 2019-04-25 NOTE — ED Triage Notes (Signed)
Patient arrived with complaints of abdominal pain, nausea and vomiting post procedure four days ago. Patient taking pain and nausea medication with little relief.

## 2019-04-25 NOTE — ED Notes (Addendum)
Pt said, on 04/20/19 he had colonoscopy and endo. He hasn't eaten anything solid since 12/10. On 12/11 woke up with bloating and constipation. Starting 12/11 mid lower abdominal pain 8/10 that is constant throbbing, pressure, cramping. On 12/11 and 12/12 nausea and vomiting throughout the day. Reports on/off chills and hot spells starting 12/12. Last night he took a laxative which resulted in 1 small stool this morning. Still feels bloated. Passed gas once today. Prior to today he has been unable to pass gas. Ate 2 popsicle today. Denies fever.

## 2019-04-25 NOTE — Consult Note (Addendum)
Consultation  Referring Provider: [facility]/ Olevia Bowens MD Primary Care Physician:  Marrian Salvage, Lee Primary Gastroenterologist:  Dr.Nandigam  Reason for Consultation:  Abdominal  pain, melena  HPI: Gabriel Dalton is a 33 y.o. male ,, established with Dr. Silverio Decamp, who is admitted today after 3 ER visits over the past couple of days with complaints of abdominal pain. Patient just underwent colonoscopy and EGD as an outpatient on 04/20/2019 for complaints of abdominal pain, failure to thrive and weight loss of 14 pounds over the past year.  He had been on twice daily PPI over the past several months.  Denies any NSAID use.   EGD revealed thickened gastric folds with hemorrhage of the gastric antrum, several superficial gastric ulcers and diffuse gastritis with friability.  Biopsies are pending. Colonoscopy showed a patchy area in the terminal ileum which was biopsied and mildly ulcerated rectal mucosa, and discontinuous ulcerated mucosa of the sigmoid and transverse colon-biopsies pending.  Patient underwent CT of the abdomen pelvis on 04/22/2019 which showed no acute abnormality or explanation for abdominal pain. Chest x-ray and KUB yesterday negative.  Today's labs show WBC of 14.5, WBC was up to 20 yesterday. Hemoglobin 12.3, creatinine 0.78, LFTs within normal limits. Covid testing was negative on 04/18/2019.  Patient said he had not had a bowel movement since his procedures and has been having increase in crampy lower abdominal pain and bloating.  He also had nausea and vomiting over the weekend with inability to keep down p.o.'s which was new for him. He does have history of cannabis use, this was not discussed with him today in detail as other family members in the room.   Past Medical History:  Diagnosis Date  . Bronchitis     Past Surgical History:  Procedure Laterality Date  . I&D EXTREMITY  10/18/2011   Procedure: IRRIGATION AND DEBRIDEMENT EXTREMITY;  Surgeon:  Roseanne Kaufman, MD;  Location: [facility];  Service: Orthopedics;  Laterality: Right;  Irrigation and Debridement  Right Middle Finger; Removal of foreign body  . NO PAST SURGERIES      Prior to Admission medications   Medication Sig Start Date End Date Taking? Authorizing Provider  Hyoscyamine Sulfate SL (LEVSIN/SL) 0.125 MG SUBL Place 0.125 mg under the tongue every 4 (four) hours as needed (abd pain). 04/23/19  Yes Muthersbaugh, Jarrett Soho, PA-C  metoCLOPramide (REGLAN) 10 MG tablet Take 1 tablet (10 mg total) by mouth every 6 (six) hours. Patient taking differently: Take 10 mg by mouth every 6 (six) hours as needed for nausea or vomiting.  04/24/19  Yes Upstill, Nehemiah Settle, PA-C  omeprazole (PRILOSEC) 20 MG capsule Take 1 capsule (20 mg total) by mouth 2 (two) times daily before a meal. 03/23/19  Yes Marrian Salvage, FNP  ondansetron (ZOFRAN ODT) 8 MG disintegrating tablet 8mg  ODT q4 hours prn nausea Patient taking differently: Take 8 mg by mouth every 8 (eight) hours as needed for nausea or vomiting. 8mg  ODT q4 hours prn nausea 04/23/19  Yes Muthersbaugh, Jarrett Soho, PA-C    Current Facility-Administered Medications  Medication Dose Route Frequency Provider Last Rate Last Admin  . 0.9 %  sodium chloride infusion   Intravenous Continuous Reubin Milan, MD      . fentaNYL (SUBLIMAZE) injection 50 mcg  50 mcg Intravenous Q2H PRN Reubin Milan, MD      . hydrocortisone (ANUSOL-HC) suppository 25 mg  25 mg Rectal BID Reubin Milan, MD   25 mg at 04/25/19 1033  . pantoprazole (  PROTONIX) injection 40 mg  40 mg Intravenous Q12H Bobette Mortiz, David Manuel, MD      . prochlorperazine (COMPAZINE) injection 5 mg  5 mg Intravenous Q4H PRN Bobette Mortiz, David Manuel, MD       Current Outpatient Medications  Medication Sig Dispense Refill  . Hyoscyamine Sulfate SL (LEVSIN/SL) 0.125 MG SUBL Place 0.125 mg under the tongue every 4 (four) hours as needed (abd pain). 60 tablet 0  . metoCLOPramide (REGLAN) 10 MG  tablet Take 1 tablet (10 mg total) by mouth every 6 (six) hours. (Patient taking differently: Take 10 mg by mouth every 6 (six) hours as needed for nausea or vomiting. ) 30 tablet 0  . omeprazole (PRILOSEC) 20 MG capsule Take 1 capsule (20 mg total) by mouth 2 (two) times daily before a meal. 60 capsule 3  . ondansetron (ZOFRAN ODT) 8 MG disintegrating tablet 8mg  ODT q4 hours prn nausea (Patient taking differently: Take 8 mg by mouth every 8 (eight) hours as needed for nausea or vomiting. 8mg  ODT q4 hours prn nausea) 4 tablet 0    Allergies as of 04/25/2019  . (No Known Allergies)    Family History  Problem Relation Age of Onset  . Hypertension Father   . Colon cancer Maternal Uncle   . Rectal cancer Maternal Uncle   . Brain cancer Maternal Uncle   . Heart disease Maternal Aunt   . Esophageal cancer Neg Hx   . Stomach cancer Neg Hx     Social History   Socioeconomic History  . Marital status: Married    Spouse name: Not on file  . Number of children: Not on file  . Years of education: Not on file  . Highest education level: Not on file  Occupational History  . Not on file  Tobacco Use  . Smoking status: Current Every Day Smoker    Packs/day: 0.50  . Smokeless tobacco: Never Used  Substance and Sexual Activity  . Alcohol use: Yes    Comment: occasional  . Drug use: Not Currently  . Sexual activity: Not on file  Other Topics Concern  . Not on file  Social History Narrative  . Not on file   Social Determinants of Health   Financial Resource Strain:   . Difficulty of Paying Living Expenses: Not on file  Food Insecurity:   . Worried About Programme researcher, broadcasting/film/videounning Out of Food in the Last Year: Not on file  . Ran Out of Food in the Last Year: Not on file  Transportation Needs:   . Lack of Transportation (Medical): Not on file  . Lack of Transportation (Non-Medical): Not on file  Physical Activity:   . Days of Exercise per Week: Not on file  . Minutes of Exercise per Session: Not on  file  Stress:   . Feeling of Stress : Not on file  Social Connections:   . Frequency of Communication with Friends and Family: Not on file  . Frequency of Social Gatherings with Friends and Family: Not on file  . Attends Religious Services: Not on file  . Active Member of Clubs or Organizations: Not on file  . Attends BankerClub or Organization Meetings: Not on file  . Marital Status: Not on file  Intimate Partner Violence:   . Fear of Current or Ex-Partner: Not on file  . Emotionally Abused: Not on file  . Physically Abused: Not on file  . Sexually Abused: Not on file    Review of Systems: Pertinent positive and negative review  of systems were noted in the above HPI section.  All other review of systems was otherwise negative.  Physical Exam: Vital signs in last 24 hours: Pulse Rate:  [58-85] 69 (12/14 1100) Resp:  [17-21] 17 (12/14 1100) BP: (114-137)/(68-87) 125/79 (12/14 1100) SpO2:  [96 %-100 %] 100 % (12/14 1100)   General:   Alert,  Well-developed, well-nourished thin young African-American male, pleasant and cooperative in NAD.  Accompanied by 2 family members. Head:  Normocephalic and atraumatic. Eyes:  Sclera clear, no icterus.   Conjunctiva pink. Ears:  Normal auditory acuity. Nose:  No deformity, discharge,  or lesions. Mouth:  No deformity or lesions.   Neck:  Supple; no masses or thyromegaly. Lungs:  Clear throughout to auscultation.   No wheezes, crackles, or rhonchi.  Heart:  Regular rate and rhythm; no murmurs, clicks, rubs,  or gallops. Abdomen:  Soft, he is tender in the left lower quadrant left mid quadrant and hypogastrium, no rebound, BS active,nonpalp mass or hsm.   Rectal:  Deferred  Msk:  Symmetrical without gross deformities. . Pulses:  Normal pulses noted. Extremities:  Without clubbing or edema. Neurologic:  Alert and  oriented x4;  grossly normal neurologically. Skin:  Intact without significant lesions or rashes.. Psych:  Alert and cooperative.  Normal mood and affect.  Intake/Output from previous day: No intake/output data recorded. Intake/Output this shift: Total I/O In: 1000 [IV Piggyback:1000] Out: -   Lab Results: Recent Labs    04/22/19 2005 04/23/19 2005 04/25/19 0613  WBC 12.6* 20.0* 14.5*  HGB 13.7 12.9* 12.3*  HCT 40.8 38.3* 36.2*  PLT 371 324 325   BMET Recent Labs    04/22/19 2005 04/23/19 2005 04/25/19 0613  NA 140 136 136  K 3.8 3.8 4.1  CL 103 98 98  CO2 26 27 25   GLUCOSE 115* 101* 83  BUN 9 11 11   CREATININE 0.80 0.80 0.79  CALCIUM 9.7 9.5 9.3   LFT Recent Labs    04/25/19 0613  PROT 7.7  ALBUMIN 3.9  AST 19  ALT 17  ALKPHOS 70  BILITOT 1.4*   PT/INR No results for input(s): LABPROT, INR in the last 72 hours. Hepatitis Panel No results for input(s): HEPBSAG, HCVAB, HEPAIGM, HEPBIGM in the last 72 hours.    IMPRESSION:   #72 33 year old African-American male with multiple month history of lower abdominal pain, weight loss and intermittent nausea. Patient has had 3 ER visits over the past couple of days with continued complaints of abdominal pain and somewhat intractable nausea and vomiting over the past couple of days status post endoscopic evaluation last week as an outpatient.  CT imaging 2 days ago was unremarkable EGD and colonoscopy last week remarkable for significant pangastritis, and patchy inflammatory changes of the rectum rectosigmoid colon transverse colon and terminal ileum concerning for underlying Crohn's disease. Path is still pending. Suspect he has underlying IBD accounting for all of his GI symptoms.  #2 leukocytosis secondary to above #3 cannabis use  Plan; await path results Clear to full liquid diet Continue twice daily PPI IV Around-the-clock antiemetics over the next 24 hours Will start Solu-Medrol 20 mg IV every 8 hours pending path results. If we can get nausea and vomiting under control and have a diagnosis he may be able to be discharged home  tomorrow with outpatient follow-up, on oral steroids. He was also using dicyclomine at home which he found beneficial, will continue     Gryffin Altice PA-C 04/25/2019, 11:54 AM

## 2019-04-26 ENCOUNTER — Other Ambulatory Visit: Payer: Self-pay

## 2019-04-28 ENCOUNTER — Encounter (HOSPITAL_COMMUNITY): Payer: Self-pay | Admitting: *Deleted

## 2019-04-28 ENCOUNTER — Emergency Department (HOSPITAL_COMMUNITY)
Admission: EM | Admit: 2019-04-28 | Discharge: 2019-04-28 | Disposition: A | Discharge status: Home / Self Care | Payer: PRIVATE HEALTH INSURANCE | Attending: Emergency Medicine | Admitting: Emergency Medicine

## 2019-04-28 DIAGNOSIS — K5900 Constipation, unspecified: Secondary | ICD-10-CM | POA: Insufficient documentation

## 2019-04-28 DIAGNOSIS — R1030 Lower abdominal pain, unspecified: Secondary | ICD-10-CM | POA: Diagnosis present

## 2019-04-28 DIAGNOSIS — F17200 Nicotine dependence, unspecified, uncomplicated: Secondary | ICD-10-CM | POA: Insufficient documentation

## 2019-04-28 DIAGNOSIS — Z79899 Other long term (current) drug therapy: Secondary | ICD-10-CM | POA: Insufficient documentation

## 2019-04-28 DIAGNOSIS — R1110 Vomiting, unspecified: Secondary | ICD-10-CM | POA: Insufficient documentation

## 2019-04-28 DIAGNOSIS — R531 Weakness: Secondary | ICD-10-CM | POA: Insufficient documentation

## 2019-04-28 LAB — COMPREHENSIVE METABOLIC PANEL
ALT: 16 U/L (ref 0–44)
AST: 15 U/L (ref 15–41)
Albumin: 4 g/dL (ref 3.5–5.0)
Alkaline Phosphatase: 79 U/L (ref 38–126)
Anion gap: 15 (ref 5–15)
BUN: 19 mg/dL (ref 6–20)
CO2: 29 mmol/L (ref 22–32)
Calcium: 9.5 mg/dL (ref 8.9–10.3)
Chloride: 90 mmol/L — ABNORMAL LOW (ref 98–111)
Creatinine, Ser: 0.91 mg/dL (ref 0.61–1.24)
GFR calc Af Amer: >60 mL/min (ref >60)
GFR calc non Af Amer: >60 mL/min (ref >60)
Glucose, Bld: 105 mg/dL — ABNORMAL HIGH (ref 70–99)
Potassium: 3.8 mmol/L (ref 3.5–5.1)
Sodium: 134 mmol/L — ABNORMAL LOW (ref 135–145)
Total Bilirubin: 1 mg/dL (ref 0.3–1.2)
Total Protein: 8.4 g/dL — ABNORMAL HIGH (ref 6.5–8.1)

## 2019-04-28 LAB — CBC WITH DIFFERENTIAL/PLATELET
Abs Immature Granulocytes: 0.05 10*3/uL (ref 0.00–0.07)
Basophils Absolute: 0 10*3/uL (ref 0.0–0.1)
Basophils Relative: 0 %
Eosinophils Absolute: 0 10*3/uL (ref 0.0–0.5)
Eosinophils Relative: 0 %
HCT: 41.8 % (ref 39.0–52.0)
Hemoglobin: 14.2 g/dL (ref 13.0–17.0)
Immature Granulocytes: 0 %
Lymphocytes Relative: 17 %
Lymphs Abs: 2 10*3/uL (ref 0.7–4.0)
MCH: 30 pg (ref 26.0–34.0)
MCHC: 34 g/dL (ref 30.0–36.0)
MCV: 88.2 fL (ref 80.0–100.0)
Monocytes Absolute: 1.1 10*3/uL — ABNORMAL HIGH (ref 0.1–1.0)
Monocytes Relative: 10 %
Neutro Abs: 8.4 10*3/uL — ABNORMAL HIGH (ref 1.7–7.7)
Neutrophils Relative %: 73 %
Platelets: 430 10*3/uL — ABNORMAL HIGH (ref 150–400)
RBC: 4.74 MIL/uL (ref 4.22–5.81)
RDW: 13.6 % (ref 11.5–15.5)
WBC: 11.6 10*3/uL — ABNORMAL HIGH (ref 4.0–10.5)
nRBC: 0 % (ref 0.0–0.2)

## 2019-04-28 LAB — SEDIMENTATION RATE: Sed Rate: 70 mm/hr — ABNORMAL HIGH (ref 0–16)

## 2019-04-28 LAB — C-REACTIVE PROTEIN: CRP: 11.2 mg/dL — ABNORMAL HIGH (ref <1.0)

## 2019-04-28 MED ORDER — ONDANSETRON 4 MG PO TBDP: 4.0000 mg | ORAL_TABLET | Freq: Three times a day (TID) | ORAL | 0 refills | Status: DC | PRN

## 2019-04-28 MED ORDER — HYOSCYAMINE SULFATE SL 0.125 MG SL SUBL: 0.1250 mg | SUBLINGUAL_TABLET | SUBLINGUAL | 0 refills | Status: DC | PRN

## 2019-04-28 MED ORDER — OMEPRAZOLE 20 MG PO CPDR: 20.0000 mg | DELAYED_RELEASE_CAPSULE | Freq: Two times a day (BID) | ORAL | 1 refills | Status: DC

## 2019-04-28 MED ORDER — BUDESONIDE ER 9 MG PO TB24: 9.0000 mg | ORAL_TABLET | Freq: Every day | ORAL | 0 refills | Status: DC

## 2019-04-28 MED ORDER — SODIUM CHLORIDE 0.9 % IV BOLUS
1000.0000 mL | Freq: Once | INTRAVENOUS | Status: AC
  Administered 2019-04-28: 1000 mL via INTRAVENOUS

## 2019-04-28 MED ORDER — PROMETHAZINE HCL 25 MG PO TABS: 25.0000 mg | ORAL_TABLET | Freq: Four times a day (QID) | ORAL | 0 refills | Status: DC | PRN

## 2019-04-28 NOTE — ED Provider Notes (Signed)
[facility] DEPT Provider Note   CSN: [id number] Arrival date & time: 04/28/19  1001     History Chief Complaint  Patient presents with  . Abdominal Pain  . Emesis  . Nausea    Gabriel Dalton is a 33 y.o. male.  HPI  33 year old male presents with recurrent abdominal pain and vomiting.  He has been having numerous episodes of vomiting over the last several days.  On 12/9 he had EGD and colonoscopy.  He was just told that these tested negative for inflammatory bowel disease and H. pylori.  Given continued vomiting and no bowel movement he presents today.  He feels generally weak.  Currently not nauseated.  The lower abdominal pain is the same as it has been since the colonoscopy. No fevers.  Past Medical History:  Diagnosis Date  . Bronchitis     Patient Active Problem List   Diagnosis Date Noted  . Intractable abdominal pain 04/25/2019  . Hyperbilirubinemia 04/25/2019  . Anemia 04/25/2019  . Leukocytosis 04/25/2019  . Gastric ulcers 04/25/2019  . Ulcerated mucosa of rectum 04/25/2019  . Melena 04/25/2019  . Generalized abdominal pain 04/01/2019  . Diarrhea 04/01/2019  . Loss of weight 04/01/2019  . Screening for viral disease 04/01/2019  . Black stools 04/01/2019    Past Surgical History:  Procedure Laterality Date  . I & D EXTREMITY  10/18/2011   Procedure: IRRIGATION AND DEBRIDEMENT EXTREMITY;  Surgeon: Roseanne Kaufman, MD;  Location: [facility];  Service: Orthopedics;  Laterality: Right;  Irrigation and Debridement  Right Middle Finger; Removal of foreign body  . NO PAST SURGERIES         Family History  Problem Relation Age of Onset  . Hypertension Father   . Colon cancer Maternal Uncle   . Rectal cancer Maternal Uncle   . Brain cancer Maternal Uncle   . Heart disease Maternal Aunt   . Esophageal cancer Neg Hx   . Stomach cancer Neg Hx     Social History   Tobacco Use  . Smoking status: Current Every Day Smoker      Packs/day: 0.50  . Smokeless tobacco: Never Used  Substance Use Topics  . Alcohol use: Yes    Comment: occasional  . Drug use: Not Currently    Home Medications Prior to Admission medications   Medication Sig Start Date End Date Taking? Authorizing Provider  Budesonide ER (UCERIS) 9 MG TB24 Take 9 mg by mouth daily. 04/28/19   Sherwood Gambler, MD  Hyoscyamine Sulfate SL (LEVSIN/SL) 0.125 MG SUBL Place 0.125 mg under the tongue every 4 (four) hours as needed (abd pain). 04/23/19   Muthersbaugh, Jarrett Soho, PA-C  omeprazole (PRILOSEC) 20 MG capsule Take 1 capsule (20 mg total) by mouth 2 (two) times daily before a meal. 03/23/19   Marrian Salvage, FNP  ondansetron (ZOFRAN ODT) 4 MG disintegrating tablet Take 1 tablet (4 mg total) by mouth every 8 (eight) hours as needed for nausea or vomiting. 04/28/19   Sherwood Gambler, MD  promethazine (PHENERGAN) 25 MG tablet Take 1 tablet (25 mg total) by mouth every 6 (six) hours as needed for nausea or vomiting. 04/28/19   Sherwood Gambler, MD  metoCLOPramide (REGLAN) 10 MG tablet Take 1 tablet (10 mg total) by mouth every 6 (six) hours. Patient taking differently: Take 10 mg by mouth every 6 (six) hours as needed for nausea or vomiting.  04/24/19 04/28/19  Charlann Lange, PA-C    Allergies    Patient  has no known allergies.  Review of Systems   Review of Systems  Constitutional: Negative for fever.  Gastrointestinal: Positive for abdominal pain, constipation and vomiting.  All other systems reviewed and are negative.   Physical Exam Updated Vital Signs BP (!) 147/86 (BP Location: Left Arm)   Pulse 67   Temp 99.8 F (37.7 C) (Oral)   Resp 15   Ht 5\' 8"  (1.727 m)   Wt 61.2 kg   SpO2 98%   BMI 20.53 kg/m   Physical Exam Vitals and nursing note reviewed.  Constitutional:      General: He is not in acute distress.    Appearance: He is well-developed. He is not ill-appearing or diaphoretic.  HENT:     Head: Normocephalic and  atraumatic.     Right Ear: External ear normal.     Left Ear: External ear normal.     Nose: Nose normal.  Eyes:     General:        Right eye: No discharge.        Left eye: No discharge.  Cardiovascular:     Rate and Rhythm: Normal rate and regular rhythm.     Heart sounds: Normal heart sounds.  Pulmonary:     Effort: Pulmonary effort is normal.     Breath sounds: Normal breath sounds.  Abdominal:     General: There is no distension.     Palpations: Abdomen is soft.     Tenderness: There is abdominal tenderness in the right lower quadrant, suprapubic area and left lower quadrant.  Musculoskeletal:     Cervical back: Neck supple.  Skin:    General: Skin is warm and dry.  Neurological:     Mental Status: He is alert.  Psychiatric:        Mood and Affect: Mood is not anxious.     ED Results / Procedures / Treatments   Labs (all labs ordered are listed, but only abnormal results are displayed) Labs Reviewed  COMPREHENSIVE METABOLIC PANEL - Abnormal; Notable for the following components:      Result Value   Sodium 134 (*)    Chloride 90 (*)    Glucose, Bld 105 (*)    Total Protein 8.4 (*)    All other components within normal limits  CBC WITH DIFFERENTIAL/PLATELET - Abnormal; Notable for the following components:   WBC 11.6 (*)    Platelets 430 (*)    Neutro Abs 8.4 (*)    Monocytes Absolute 1.1 (*)    All other components within normal limits  C-REACTIVE PROTEIN - Abnormal; Notable for the following components:   CRP 11.2 (*)    All other components within normal limits  SEDIMENTATION RATE    EKG None  Radiology No results found.  Procedures Procedures (including critical care time)  Medications Ordered in ED Medications  sodium chloride 0.9 % bolus 1,000 mL (0 mLs Intravenous Stopped 04/28/19 1445)    ED Course  I have reviewed the triage vital signs and the nursing notes.  Pertinent labs & imaging results that were available during my care of the  patient were reviewed by me and considered in my medical decision making (see chart for details).    MDM Rules/Calculators/A&P                      Patient has some lower abdominal tenderness but this has been a recurring issue and with a negative CT less than 1 week  ago I do not think repeat is needed, especially with stable vital signs, no fever, and decreasing white blood cell count.  I discussed with gastroenterology, Hyacinth MeekerJennifer Lemmon, who after discussing with attending has recommended Uceris 9 mg/day to cover through his appointment on January 6.  Stay away from marijuana.  Otherwise no vomiting in the ED.  Appears stable for discharge home. Final Clinical Impression(s) / ED Diagnoses Final diagnoses:  Lower abdominal pain    Rx / DC Orders ED Discharge Orders         Ordered    ondansetron (ZOFRAN ODT) 4 MG disintegrating tablet  Every 8 hours PRN     04/28/19 1435    promethazine (PHENERGAN) 25 MG tablet  Every 6 hours PRN     04/28/19 1435    Budesonide ER (UCERIS) 9 MG TB24  Daily     04/28/19 1435           Pricilla LovelessGoldston, Jowana Thumma, MD 04/28/19 1453

## 2019-04-28 NOTE — ED Triage Notes (Addendum)
EMS reports pt had Colonoscopy/ Endo on Dec 9th, this is 4th visit since event regarding N/V abd pain. HR 108-122/50-98 % CBG 128 T 98.1. NO N/V NOTED IN TRIAGE AS HE CONTINUOUSLY TEXTING.

## 2019-04-28 NOTE — Discharge Instructions (Addendum)
It is important to stop using marijuana, as this could cause/exacerbate your vomiting. Start the steroid prescription and keep taking this until seen by gastroenterology, as this should help your symptoms. Do not take NSAIDs, such as ibuprofen, Advil, Aleve, Motrin, Naprosyn, etc. Avoid alcohol.  If you develop worsening, continued, or recurrent abdominal pain, uncontrolled vomiting, fever, chest or back pain, or any other new/concerning symptoms then return to the ER for evaluation.

## 2019-04-29 ENCOUNTER — Telehealth: Payer: Self-pay

## 2019-04-29 NOTE — Telephone Encounter (Signed)
Called and left message for patient.

## 2019-04-29 NOTE — Telephone Encounter (Signed)
I do not want to start any new medication until he and GI can determine the source of his stomach pain. Try to work on cutting back for now and we can discuss medication once GI has pain under control.

## 2019-05-09 ENCOUNTER — Ambulatory Visit (HOSPITAL_COMMUNITY)
Admission: EM | Admit: 2019-05-09 | Discharge: 2019-05-09 | Disposition: A | Discharge status: Home / Self Care | Payer: PRIVATE HEALTH INSURANCE | Attending: Family Medicine | Admitting: Family Medicine

## 2019-05-09 ENCOUNTER — Encounter (HOSPITAL_COMMUNITY): Payer: Self-pay

## 2019-05-09 ENCOUNTER — Ambulatory Visit (INDEPENDENT_AMBULATORY_CARE_PROVIDER_SITE_OTHER): Payer: PRIVATE HEALTH INSURANCE

## 2019-05-09 ENCOUNTER — Other Ambulatory Visit: Payer: Self-pay

## 2019-05-09 DIAGNOSIS — R1084 Generalized abdominal pain: Secondary | ICD-10-CM

## 2019-05-09 NOTE — ED Triage Notes (Signed)
Pt. States since 12/9 he had a surgery & 12/11 he has had trouble passing gas/ going to the bathroom including abdominal pain.

## 2019-05-09 NOTE — ED Provider Notes (Signed)
[place]    CSN: [id number] Arrival date & time: 05/09/19  6063      History   Chief Complaint Chief Complaint  Patient presents with  . Appointment    HPI Gabriel Dalton is a 33 y.o. male.   Patient is a 33 year old male with past medical history of gastric ulcer, abdominal pain, ulcerative mucosa of the rectum, melena, diarrhea, weight loss.  He is here today for abdominal bloating, generalized pain and trouble passing gas and constipation for the past couple weeks.  He has had multiple visits to the ER and been seen by GI for similar symptoms.  He was told possible diagnosis of UC.  He started take any medication to treat this which helped but was unable to do complete course due to cost.  No fever, chills, body aches, nausea or vomiting.  No blood in stool.  ROS per HPI      Past Medical History:  Diagnosis Date  . Bronchitis     Patient Active Problem List   Diagnosis Date Noted  . Intractable abdominal pain 04/25/2019  . Hyperbilirubinemia 04/25/2019  . Anemia 04/25/2019  . Leukocytosis 04/25/2019  . Gastric ulcers 04/25/2019  . Ulcerated mucosa of rectum 04/25/2019  . Melena 04/25/2019  . Generalized abdominal pain 04/01/2019  . Diarrhea 04/01/2019  . Loss of weight 04/01/2019  . Screening for viral disease 04/01/2019  . Black stools 04/01/2019    Past Surgical History:  Procedure Laterality Date  . I & D EXTREMITY  10/18/2011   Procedure: IRRIGATION AND DEBRIDEMENT EXTREMITY;  Surgeon: Roseanne Kaufman, MD;  Location: [facility];  Service: Orthopedics;  Laterality: Right;  Irrigation and Debridement  Right Middle Finger; Removal of foreign body  . NO PAST SURGERIES         Home Medications    Prior to Admission medications   Medication Sig Start Date End Date Taking? Authorizing Provider  Budesonide ER (UCERIS) 9 MG TB24 Take 9 mg by mouth daily. 04/28/19   Sherwood Gambler, MD  Hyoscyamine Sulfate SL (LEVSIN/SL) 0.125 MG SUBL  Place 0.125 mg under the tongue every 4 (four) hours as needed (abdominal pain). 04/28/19   Sherwood Gambler, MD  omeprazole (PRILOSEC) 20 MG capsule Take 1 capsule (20 mg total) by mouth 2 (two) times daily before a meal. 04/28/19   Sherwood Gambler, MD  ondansetron (ZOFRAN ODT) 4 MG disintegrating tablet Take 1 tablet (4 mg total) by mouth every 8 (eight) hours as needed for nausea or vomiting. 04/28/19   Sherwood Gambler, MD  promethazine (PHENERGAN) 25 MG tablet Take 1 tablet (25 mg total) by mouth every 6 (six) hours as needed for nausea or vomiting. 04/28/19   Sherwood Gambler, MD  metoCLOPramide (REGLAN) 10 MG tablet Take 1 tablet (10 mg total) by mouth every 6 (six) hours. Patient taking differently: Take 10 mg by mouth every 6 (six) hours as needed for nausea or vomiting.  04/24/19 04/28/19  Charlann Lange, PA-C    Family History Family History  Problem Relation Age of Onset  . Healthy Mother   . Hypertension Father   . Colon cancer Maternal Uncle   . Rectal cancer Maternal Uncle   . Brain cancer Maternal Uncle   . Heart disease Maternal Aunt   . Esophageal cancer Neg Hx   . Stomach cancer Neg Hx     Social History Social History   Tobacco Use  . Smoking status: Current Every Day Smoker    Packs/day: 0.50  .  Smokeless tobacco: Never Used  Substance Use Topics  . Alcohol use: Yes    Comment: occasional  . Drug use: Not Currently     Allergies   Patient has no known allergies.   Review of Systems Review of Systems   Physical Exam Triage Vital Signs ED Triage Vitals  Enc Vitals Group     BP 05/09/19 0915 139/86     Pulse Rate 05/09/19 0915 91     Resp 05/09/19 0915 16     Temp 05/09/19 0915 98.3 F (36.8 C)     Temp Source 05/09/19 0915 Oral     SpO2 05/09/19 0915 100 %     Weight 05/09/19 0913 125 lb 4.8 oz (56.8 kg)     Height --      Head Circumference --      Peak Flow --      Pain Score 05/09/19 0913 8     Pain Loc --      Pain Edu? --      Excl. in  [place]? --    No data found.  Updated Vital Signs BP 139/86 (BP Location: Left Arm)   Pulse 91   Temp 98.3 F (36.8 C) (Oral)   Resp 16   Wt 125 lb 4.8 oz (56.8 kg)   SpO2 100%   BMI 19.05 kg/m   Visual Acuity Right Eye Distance:   Left Eye Distance:   Bilateral Distance:    Right Eye Near:   Left Eye Near:    Bilateral Near:     Physical Exam Vitals and nursing note reviewed.  Constitutional:      Appearance: Normal appearance.  HENT:     Head: Normocephalic and atraumatic.     Nose: Nose normal.  Eyes:     Conjunctiva/sclera: Conjunctivae normal.  Pulmonary:     Effort: Pulmonary effort is normal.  Abdominal:     General: Abdomen is flat. Bowel sounds are decreased. There is no distension or abdominal bruit.     Palpations: Abdomen is soft. There is no hepatomegaly or splenomegaly.     Tenderness: There is generalized abdominal tenderness. There is guarding. There is no right CVA tenderness, left CVA tenderness or rebound.  Musculoskeletal:        General: Normal range of motion.     Cervical back: Normal range of motion.  Skin:    General: Skin is warm and dry.  Neurological:     Mental Status: He is alert.  Psychiatric:        Mood and Affect: Mood normal.      UC Treatments / Results  Labs (all labs ordered are listed, but only abnormal results are displayed) Labs Reviewed - No data to display  EKG   Radiology DG Abd 2 Views  Result Date: 05/09/2019 CLINICAL DATA:  Abdominal pain and bloating following colonoscopy and endoscopy. EXAM: ABDOMEN - 2 VIEW COMPARISON:  04/23/2019 FINDINGS: Bowel gas pattern is within normal limits without evidence of ileus, obstruction or abnormal stool burden. No significant calcifications or bone findings. IMPRESSION: Negative radiographs presently. Electronically Signed   By: Nelson Chimes M.D.   On: 05/09/2019 10:37    Procedures Procedures (including critical care time)  Medications Ordered in UC Medications - No  data to display  Initial Impression / Assessment and Plan / UC Course  I have reviewed the triage vital signs and the nursing notes.  Pertinent labs & imaging results that were available during my care of the  patient were reviewed by me and considered in my medical decision making (see chart for details).     33 year old male that presents today with chronic abdominal pain. Has had multiple visits to the ER over the last month and has also been seen by GI specialist and had a colonoscopy. Question of diagnosis of ulcerative colitis Was taking medication for this which helped but is too expensive Here today with generalized abdominal discomfort, bloating, constipation and gas. No blood in stool No acute abdomen on exam and bowel sounds hypoactive Vital signs stable and he is nontoxic or ill-appearing. X-ray revealed some stool burden but not severe. 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 We will have him do MiraLAX 1 scoop twice a day for the next couple days to see if this helps Otherwise if his abdominal pain worsens he will need to go back to the ER. Has an appointment to see GI specialist on January 6  Final Clinical Impressions(s) / UC Diagnoses   Final diagnoses:  Generalized abdominal pain     Discharge Instructions     We believe your pain may be due to some constipation. Try doing the MiraLAX 1 scoop twice a day to see if you can get a good bowel met and help with your discomfort. You can then back off to once a day Otherwise if your symptoms worsen you need to go to the ER. Make sure you follow-up with your GI specialist     ED Prescriptions    None     PDMP not reviewed this encounter.   Orvan July, NP 05/09/19 1108

## 2019-05-09 NOTE — Discharge Instructions (Signed)
We believe your pain may be due to some constipation. Try doing the MiraLAX 1 scoop twice a day to see if you can get a good bowel met and help with your discomfort. You can then back off to once a day Otherwise if your symptoms worsen you need to go to the ER. Make sure you follow-up with your GI specialist

## 2019-05-18 ENCOUNTER — Encounter: Payer: Self-pay | Admitting: Physician Assistant

## 2019-05-18 ENCOUNTER — Ambulatory Visit (INDEPENDENT_AMBULATORY_CARE_PROVIDER_SITE_OTHER): Payer: PRIVATE HEALTH INSURANCE | Admitting: Physician Assistant

## 2019-05-18 VITALS — BP 100/72 | HR 92 | Temp 98.2°F | Ht 68.25 in | Wt 128.0 lb

## 2019-05-18 DIAGNOSIS — K259 Gastric ulcer, unspecified as acute or chronic, without hemorrhage or perforation: Secondary | ICD-10-CM | POA: Diagnosis not present

## 2019-05-18 DIAGNOSIS — K2970 Gastritis, unspecified, without bleeding: Secondary | ICD-10-CM

## 2019-05-18 DIAGNOSIS — K5900 Constipation, unspecified: Secondary | ICD-10-CM

## 2019-05-18 MED ORDER — PANTOPRAZOLE SODIUM 40 MG PO TBEC: 40.0000 mg | DELAYED_RELEASE_TABLET | Freq: Two times a day (BID) | ORAL | 3 refills | Status: DC

## 2019-05-18 MED ORDER — SUCRALFATE 1 G PO TABS: 1.0000 g | ORAL_TABLET | Freq: Three times a day (TID) | ORAL | 0 refills | Status: DC

## 2019-05-18 NOTE — Patient Instructions (Signed)
If you are age 33 or older, your body mass index should be between 23-30. Your Body mass index is 19.32 kg/m. If this is out of the aforementioned range listed, please consider follow up with your Primary Care Provider.  If you are age 65 or younger, your body mass index should be between 19-25. Your Body mass index is 19.32 kg/m. If this is out of the aformentioned range listed, please consider follow up with your Primary Care Provider.   We have sent the following medications to your pharmacy for you to pick up at your convenience: Protonix 40 mg twice daily - take this in place of Prilosec.  Carafate 1 gm take three times daily.  Take in between meals.  NO Aspirin, Advil etc. Tylenol is okay.  Continue Miralax twice daily.  Follow up with me on June 01, 2019 at 1:30 pm.  Thank you for choosing me and [facility].   Amy Esterwood, PA-C

## 2019-05-18 NOTE — Progress Notes (Signed)
Subjective:    Patient ID: Gabriel Dalton, male    DOB: May 27, 1985, 34 y.o.   MRN: [id number]  HPI Gabriel Dalton is a pleasant 34 year old African-American male, established with Gabriel Dalton who comes in today for follow-up of abdominal pain nausea and vomiting.  Patient was initially seen here November 2020, with complaints of abdominal pain and weight loss over the past several months. He underwent EGD on 04/20/2019 that showed a small hiatal hernia, localized thickened gastric folds in the prepyloric area there were a few nonbleeding superficial gastric ulcers and diffuse gastritis.  Biopsies were negative for H. pylori. He also underwent colonoscopy which showed a small area of patchy nonbleeding ulcerated mucosa in the rectum and the terminal ileum had a small patch of friability.  Question of underlying IBD was raised however biopsies did not show any active chronic inflammation. He had an ER visit 04/22/2019 and had CT of the abdomen and pelvis which was negative. He was then seen in consult while in the ER on 04/25/2019.  Sed rate and CRP were done showing sed rate of 70 and CRP of 11.2. Patient left AMA though he was to be admitted overnight. He has had 2 more ER visits over the past couple weeks with complaints of abdominal pain. I believe one of the ER physicians had sent a prescription for Uceris ,, patient did not start this as it was prohibitively expensive at $1300 per month.  He has been taking omeprazole 20 mg twice daily.  He is not requiring Zofran on any sort of regular basis.  He is not using Levsin as he did not find it helpful. He continues to complain of abdominal pain which seems to be worse immediately postprandially and can be associated with some bloating.  He also feels worse if he is up and around too much.  He has been having problems with constipation ever since the colonoscopy.  He says is as if there is some sort of blockage.  However he has been using MiraLAX  twice daily with good success and having good bowel movements every day.  He is also been using Dulcolax as needed.  He has not noticed any melena.  Review of Systems Pertinent positive and negative review of systems were noted in the above HPI section.  All other review of systems was otherwise negative.  Outpatient Encounter Medications as of 05/18/2019  Medication Sig  . omeprazole (PRILOSEC) 20 MG capsule Take 1 capsule (20 mg total) by mouth 2 (two) times daily before a meal.  . Budesonide ER (UCERIS) 9 MG TB24 Take 9 mg by mouth daily. (Patient not taking: Reported on 05/18/2019)  . Hyoscyamine Sulfate SL (LEVSIN/SL) 0.125 MG SUBL Place 0.125 mg under the tongue every 4 (four) hours as needed (abdominal pain). (Patient not taking: Reported on 05/18/2019)  . ondansetron (ZOFRAN ODT) 4 MG disintegrating tablet Take 1 tablet (4 mg total) by mouth every 8 (eight) hours as needed for nausea or vomiting. (Patient not taking: Reported on 05/18/2019)  . pantoprazole (PROTONIX) 40 MG tablet Take 1 tablet (40 mg total) by mouth 2 (two) times daily.  . promethazine (PHENERGAN) 25 MG tablet Take 1 tablet (25 mg total) by mouth every 6 (six) hours as needed for nausea or vomiting. (Patient not taking: Reported on 05/18/2019)  . sucralfate (CARAFATE) 1 g tablet Take 1 tablet (1 g total) by mouth 3 (three) times daily. Take between meals  . [DISCONTINUED] metoCLOPramide (REGLAN) 10 MG tablet Take 1  tablet (10 mg total) by mouth every 6 (six) hours. (Patient taking differently: Take 10 mg by mouth every 6 (six) hours as needed for nausea or vomiting. )   No facility-administered encounter medications on file as of 05/18/2019.   No Known Allergies Patient Active Problem List   Diagnosis Date Noted  . Intractable abdominal pain 04/25/2019  . Hyperbilirubinemia 04/25/2019  . Anemia 04/25/2019  . Leukocytosis 04/25/2019  . Gastric ulcers 04/25/2019  . Ulcerated mucosa of rectum 04/25/2019  . Melena 04/25/2019  .  Generalized abdominal pain 04/01/2019  . Diarrhea 04/01/2019  . Loss of weight 04/01/2019  . Screening for viral disease 04/01/2019  . Black stools 04/01/2019   Social History   Socioeconomic History  . Marital status: Married    Spouse name: Not on file  . Number of children: Not on file  . Years of education: Not on file  . Highest education level: Not on file  Occupational History  . Not on file  Tobacco Use  . Smoking status: Current Every Day Smoker    Packs/day: 0.50  . Smokeless tobacco: Never Used  Substance and Sexual Activity  . Alcohol use: Yes    Comment: occasional  . Drug use: Not Currently  . Sexual activity: Not on file  Other Topics Concern  . Not on file  Social History Narrative  . Not on file   Social Determinants of Health   Financial Resource Strain:   . Difficulty of Paying Living Expenses: Not on file  Food Insecurity:   . Worried About Charity fundraiser in the Last Year: Not on file  . Ran Out of Food in the Last Year: Not on file  Transportation Needs:   . Lack of Transportation (Medical): Not on file  . Lack of Transportation (Non-Medical): Not on file  Physical Activity:   . Days of Exercise per Week: Not on file  . Minutes of Exercise per Session: Not on file  Stress:   . Feeling of Stress : Not on file  Social Connections:   . Frequency of Communication with Friends and Family: Not on file  . Frequency of Social Gatherings with Friends and Family: Not on file  . Attends Religious Services: Not on file  . Active Member of Clubs or Organizations: Not on file  . Attends Archivist Meetings: Not on file  . Marital Status: Not on file  Intimate Partner Violence:   . Fear of Current or Ex-Partner: Not on file  . Emotionally Abused: Not on file  . Physically Abused: Not on file  . Sexually Abused: Not on file    Gabriel Dalton's family history includes Brain cancer in his maternal uncle; Colon cancer in his maternal uncle;  Healthy in his mother; Heart disease in his maternal aunt; Hypertension in his father; Rectal cancer in his maternal uncle.      Objective:    Vitals:   05/18/19 1351  BP: 100/72  Pulse: 92  Temp: 98.2 F (36.8 C)    Physical Exam Well-developed well-nourished young African-American male in no acute distress.   Weight, 128 BMI 19.3  HEENT; nontraumatic normocephalic, EOMI, PER R LA, sclera anicteric. Oropharynx; not examined/mask/Covid Neck; supple, no JVD Cardiovascular; regular rate and rhythm with S1-S2, no murmur rub or gallop Pulmonary; Clear bilaterally Abdomen; soft, , nondistended, no palpable mass or hepatosplenomegaly, he is tender in the epigastrium and hypogastrium, bowel sounds are active Rectal; not done today Skin; benign exam, no jaundice  rash or appreciable lesions Extremities; no clubbing cyanosis or edema skin warm and dry Neuro/Psych; alert and oriented x4, grossly nonfocal mood and affect appropriate       Assessment & Plan:   #77 34 year old African-American male with gastritis, superficial gastric ulcers and thickened gastric folds on recent EGD 04/20/2019.  H. pylori negative. Patient is having persistent abdominal pain worse postprandially and has had repeated ER visits with abdominal pain. He is able to eat and is not having any regular nausea and no vomiting.  #2 patchy area of friability in the terminal ileum on recent colonoscopy and patchy nonbleeding ulcerated mucosa in the rectum, however biopsies from both of these areas negative for any acute inflammation. Not consistent with IBD  #3 constipation  Plan; Long discussion with patient today regarding his diagnosis.  Explained to him that it will take multiple weeks for his stomach to heal, and that he can expect to have some continued discomfort.  I discouraged him from repeated ER visits as this has not really changed his management.  Will switch to Protonix and increased dose to Protonix 40 mg  p.o. twice daily. Add Carafate 1 g between meals 3 times daily Continue MiraLAX 17 g in 8 ounces of water twice daily and as needed Dulcolax. Advised frequent small meals with blander easier to digest foods. Avoid aspirin and NSAIDs, may use Tylenol. Patient has been out of work due to acute illness.  We will plan to follow him up in the office in 2 weeks. Patient advised to call in the interim should he have any worsening or change in symptoms rather than presenting to the emergency room.  Plan  Lindsey Demonte Oswald Hillock PA-C 05/18/2019   Cc: Olive Bass,*

## 2019-05-25 ENCOUNTER — Ambulatory Visit: Payer: PRIVATE HEALTH INSURANCE | Admitting: Gastroenterology

## 2019-06-01 ENCOUNTER — Ambulatory Visit (INDEPENDENT_AMBULATORY_CARE_PROVIDER_SITE_OTHER): Payer: PRIVATE HEALTH INSURANCE | Admitting: Physician Assistant

## 2019-06-01 ENCOUNTER — Encounter: Payer: Self-pay | Admitting: Physician Assistant

## 2019-06-01 VITALS — BP 112/64 | HR 72 | Temp 98.2°F | Ht 68.25 in | Wt 129.0 lb

## 2019-06-01 DIAGNOSIS — K259 Gastric ulcer, unspecified as acute or chronic, without hemorrhage or perforation: Secondary | ICD-10-CM

## 2019-06-01 DIAGNOSIS — K2970 Gastritis, unspecified, without bleeding: Secondary | ICD-10-CM

## 2019-06-01 MED ORDER — PANTOPRAZOLE SODIUM 40 MG PO TBEC: 40.0000 mg | DELAYED_RELEASE_TABLET | Freq: Two times a day (BID) | ORAL | 3 refills | Status: DC

## 2019-06-01 NOTE — Progress Notes (Signed)
Subjective:    Patient ID: Gabriel Dalton, male    DOB: 1985/10/26, 34 y.o.   MRN: [id number]  HPI Gabriel Dalton is a pleasant 34 year old African-American male, who comes in today for follow-up.  He was last seen by myself on 05/18/2019 and is established with Dr. Lavon Paganini.  He had presented with abdominal pain, nausea, and vomiting.  He underwent EGD on 04/20/2019 showed a small hiatal hernia, localized thickened gastric folds in the prepyloric area where there were a few nonbleeding superficial gastric ulcers and diffuse gastritis.  Biopsies were done negative for H. pylori. He also had colonoscopy with a small area of patchy nonbleeding ulcerated mucosa in the rectum and in the terminal ileum was noted to be a small patch of friability.  Question of underlying IBD was raised however the biopsies did not show any chronic active inflammation. When seen in follow-up on 05/17/2018 when he was continuing to complain of abdominal pain stating that he felt worse immediately postprandially, and worse if up and around a lot.  He also been having some problems with constipation and was using MiraLAX twice daily. At that time he was switched to Protonix and this was increased to twice daily dosing also added Carafate 1 g between meals 3 times daily.  He was encouraged to eat small frequent meals will have bland softer foods.  Avoid aspirin and NSAIDs and continue MiraLAX once or twice daily as needed.  He had been out of work due to his symptoms. He comes in today stating that overall he is feeling much better.  He is able to eat at this point without discomfort though he says he still does not usually eat anything early in the morning.  He is not having any problems with nausea or vomiting.  He is using MiraLAX as needed but says he is not requiring it on a regular basis.  Review of Systems Pertinent positive and negative review of systems were noted in the above HPI section.  All other review of systems  was otherwise negative.  Outpatient Encounter Medications as of 06/01/2019  Medication Sig  . pantoprazole (PROTONIX) 40 MG tablet Take 1 tablet (40 mg total) by mouth 2 (two) times daily.  . [DISCONTINUED] pantoprazole (PROTONIX) 40 MG tablet Take 1 tablet (40 mg total) by mouth 2 (two) times daily.  . [DISCONTINUED] sucralfate (CARAFATE) 1 g tablet Take 1 tablet (1 g total) by mouth 3 (three) times daily. Take between meals  . [DISCONTINUED] Budesonide ER (UCERIS) 9 MG TB24 Take 9 mg by mouth daily. (Patient not taking: Reported on 05/18/2019)  . [DISCONTINUED] Hyoscyamine Sulfate SL (LEVSIN/SL) 0.125 MG SUBL Place 0.125 mg under the tongue every 4 (four) hours as needed (abdominal pain). (Patient not taking: Reported on 05/18/2019)  . [DISCONTINUED] metoCLOPramide (REGLAN) 10 MG tablet Take 1 tablet (10 mg total) by mouth every 6 (six) hours. (Patient taking differently: Take 10 mg by mouth every 6 (six) hours as needed for nausea or vomiting. )  . [DISCONTINUED] omeprazole (PRILOSEC) 20 MG capsule Take 1 capsule (20 mg total) by mouth 2 (two) times daily before a meal.  . [DISCONTINUED] ondansetron (ZOFRAN ODT) 4 MG disintegrating tablet Take 1 tablet (4 mg total) by mouth every 8 (eight) hours as needed for nausea or vomiting. (Patient not taking: Reported on 05/18/2019)  . [DISCONTINUED] promethazine (PHENERGAN) 25 MG tablet Take 1 tablet (25 mg total) by mouth every 6 (six) hours as needed for nausea or vomiting. (Patient not taking:  Reported on 05/18/2019)   No facility-administered encounter medications on file as of 06/01/2019.   No Known Allergies Patient Active Problem List   Diagnosis Date Noted  . Intractable abdominal pain 04/25/2019  . Hyperbilirubinemia 04/25/2019  . Anemia 04/25/2019  . Leukocytosis 04/25/2019  . Gastric ulcers 04/25/2019  . Ulcerated mucosa of rectum 04/25/2019  . Melena 04/25/2019  . Generalized abdominal pain 04/01/2019  . Diarrhea 04/01/2019  . Loss of weight  04/01/2019  . Screening for viral disease 04/01/2019  . Black stools 04/01/2019   Social History   Socioeconomic History  . Marital status: Married    Spouse name: Not on file  . Number of children: Not on file  . Years of education: Not on file  . Highest education level: Not on file  Occupational History  . Not on file  Tobacco Use  . Smoking status: Current Every Day Smoker    Packs/day: 0.50  . Smokeless tobacco: Never Used  Substance and Sexual Activity  . Alcohol use: Yes    Comment: occasional  . Drug use: Not Currently  . Sexual activity: Not on file  Other Topics Concern  . Not on file  Social History Narrative  . Not on file   Social Determinants of Health   Financial Resource Strain:   . Difficulty of Paying Living Expenses: Not on file  Food Insecurity:   . Worried About Programme researcher, broadcasting/film/video in the Last Year: Not on file  . Ran Out of Food in the Last Year: Not on file  Transportation Needs:   . Lack of Transportation (Medical): Not on file  . Lack of Transportation (Non-Medical): Not on file  Physical Activity:   . Days of Exercise per Week: Not on file  . Minutes of Exercise per Session: Not on file  Stress:   . Feeling of Stress : Not on file  Social Connections:   . Frequency of Communication with Friends and Family: Not on file  . Frequency of Social Gatherings with Friends and Family: Not on file  . Attends Religious Services: Not on file  . Active Member of Clubs or Organizations: Not on file  . Attends Banker Meetings: Not on file  . Marital Status: Not on file  Intimate Partner Violence:   . Fear of Current or Ex-Partner: Not on file  . Emotionally Abused: Not on file  . Physically Abused: Not on file  . Sexually Abused: Not on file    Gabriel Dalton's family history includes Brain cancer in his maternal uncle; Colon cancer in his maternal uncle; Healthy in his mother; Heart disease in his maternal aunt; Hypertension in his  father; Rectal cancer in his maternal uncle.      Objective:    Vitals:   06/01/19 1324  BP: 112/64  Pulse: 72  Temp: 98.2 F (36.8 C)    Physical Exam Well-developed well-nourished young African-American male in no acute distress.   Weight, 129 BMI 19.4  HEENT; nontraumatic normocephalic, EOMI, PER R LA, sclera anicteric. Oropharynx; not examined/mask/Covid Neck; supple, no JVD Cardiovascular; regular rate and rhythm with S1-S2, no murmur rub or gallop Pulmonary; Clear bilaterally Abdomen; soft, nontender, nondistended, no palpable mass or hepatosplenomegaly, bowel sounds are active Rectal; not done today Skin; benign exam, no jaundice rash or appreciable lesions Extremities; no clubbing cyanosis or edema skin warm and dry Neuro/Psych; alert and oriented x4, grossly nonfocal mood and affect appropriate       Assessment & Plan:   #  43 34 year old African-American male with diffuse gastritis and superficial prepyloric ulcers on EGD 04/20/2019. H. pylori negative.  #2 constipation-improved  Patient is much better on current regimen.  Plan continue Protonix twice daily for 1 more month then decrease to once daily long-term-refill sent He will finish his current prescription of Carafate and then discontinue We discussed again avoidance of aspirin NSAIDs and EtOH. Patient was given a note to return to work. He will follow-up with Dr. Silverio Decamp  or myself on an as-needed basis.  Harlow Carrizales S Zaviyar Rahal PA-C 06/01/2019   Cc: Marrian Salvage,*

## 2019-06-01 NOTE — Patient Instructions (Addendum)
Stop Carafate.   Continue Protonix twice daily for 1 month. Then decrease to once daily thereafter.   Avoid NSAIDS.   Follow-up as needed.   If you are age 33 or younger, your body mass index should be between 19-25. Your Body mass index is 19.47 kg/m. If this is out of the aformentioned range listed, please consider follow up with your Primary Care Provider.   Work note has been given.    Thank you for choosing me and [facility].  Amy Esterwood-PA

## 2019-06-02 NOTE — Progress Notes (Signed)
Reviewed and agree with documentation and assessment and plan. K. Veena Crystalynn Mcinerney , MD   

## 2019-06-09 ENCOUNTER — Other Ambulatory Visit: Payer: Self-pay | Admitting: Physician Assistant

## 2019-08-23 ENCOUNTER — Emergency Department (HOSPITAL_COMMUNITY): Payer: PRIVATE HEALTH INSURANCE

## 2019-08-23 ENCOUNTER — Encounter (HOSPITAL_COMMUNITY): Payer: Self-pay | Admitting: Emergency Medicine

## 2019-08-23 ENCOUNTER — Emergency Department (HOSPITAL_COMMUNITY)
Admission: EM | Admit: 2019-08-23 | Discharge: 2019-08-23 | Disposition: A | Discharge status: Home / Self Care | Payer: PRIVATE HEALTH INSURANCE | Attending: Emergency Medicine | Admitting: Emergency Medicine

## 2019-08-23 DIAGNOSIS — F17210 Nicotine dependence, cigarettes, uncomplicated: Secondary | ICD-10-CM | POA: Insufficient documentation

## 2019-08-23 DIAGNOSIS — G5621 Lesion of ulnar nerve, right upper limb: Secondary | ICD-10-CM | POA: Insufficient documentation

## 2019-08-23 DIAGNOSIS — Z79899 Other long term (current) drug therapy: Secondary | ICD-10-CM | POA: Insufficient documentation

## 2019-08-23 MED ORDER — NAPROXEN 500 MG PO TABS: 500.0000 mg | ORAL_TABLET | Freq: Two times a day (BID) | ORAL | 0 refills | Status: DC

## 2019-08-23 MED ORDER — CYCLOBENZAPRINE HCL 10 MG PO TABS: 10.0000 mg | ORAL_TABLET | Freq: Two times a day (BID) | ORAL | 0 refills | Status: DC | PRN

## 2019-08-23 NOTE — Discharge Instructions (Addendum)
Follow-up with orthopedics, call to schedule an appointment. Take naproxen and Flexeril as prescribed.  See discharge information on further treatment at home for cubital tunnel.

## 2019-08-23 NOTE — ED Triage Notes (Signed)
Pt here from home with c/o right arm pain from the elbow down , pt is unable to straighten the arm without pain , no trauma noted

## 2019-08-23 NOTE — ED Provider Notes (Signed)
[facility] EMERGENCY DEPARTMENT Provider Note   CSN: [id number] Arrival date & time: 08/23/19  6160     History No chief complaint on file.   Gabriel Dalton is a 34 y.o. male.  34 year old male resents with complaint of pain in his right elbow.  Patient reports pain to the generalized elbow however more so medially with electric type pain that radiates down his forearm to his right fourth and fifth fingers.  Patient denies any known injuries to this arm, no recent falls.  Patient states pain is getting progressively worse.  Patient has not taken anything for his pain, is worse with movement.  No other complaints or concerns today.        Past Medical History:  Diagnosis Date  . Bronchitis     Patient Active Problem List   Diagnosis Date Noted  . Intractable abdominal pain 04/25/2019  . Hyperbilirubinemia 04/25/2019  . Anemia 04/25/2019  . Leukocytosis 04/25/2019  . Gastric ulcers 04/25/2019  . Ulcerated mucosa of rectum 04/25/2019  . Melena 04/25/2019  . Generalized abdominal pain 04/01/2019  . Diarrhea 04/01/2019  . Loss of weight 04/01/2019  . Screening for viral disease 04/01/2019  . Black stools 04/01/2019    Past Surgical History:  Procedure Laterality Date  . I & D EXTREMITY  10/18/2011   Procedure: IRRIGATION AND DEBRIDEMENT EXTREMITY;  Surgeon: Dominica Severin, MD;  Location: [facility] OR;  Service: Orthopedics;  Laterality: Right;  Irrigation and Debridement  Right Middle Finger; Removal of foreign body  . NO PAST SURGERIES         Family History  Problem Relation Age of Onset  . Healthy Mother   . Hypertension Father   . Colon cancer Maternal Uncle   . Rectal cancer Maternal Uncle   . Brain cancer Maternal Uncle   . Heart disease Maternal Aunt   . Esophageal cancer Neg Hx   . Stomach cancer Neg Hx     Social History   Tobacco Use  . Smoking status: Current Every Day Smoker    Packs/day: 0.50  . Smokeless tobacco:  Never Used  Substance Use Topics  . Alcohol use: Yes    Comment: occasional  . Drug use: Not Currently    Home Medications Prior to Admission medications   Medication Sig Start Date End Date Taking? Authorizing Provider  cyclobenzaprine (FLEXERIL) 10 MG tablet Take 1 tablet (10 mg total) by mouth 2 (two) times daily as needed for muscle spasms. 08/23/19   Jeannie Fend, PA-C  naproxen (NAPROSYN) 500 MG tablet Take 1 tablet (500 mg total) by mouth 2 (two) times daily. 08/23/19   Jeannie Fend, PA-C  pantoprazole (PROTONIX) 40 MG tablet Take 1 tablet (40 mg total) by mouth 2 (two) times daily. 06/01/19   Esterwood, Amy S, PA-C  metoCLOPramide (REGLAN) 10 MG tablet Take 1 tablet (10 mg total) by mouth every 6 (six) hours. Patient taking differently: Take 10 mg by mouth every 6 (six) hours as needed for nausea or vomiting.  04/24/19 04/28/19  Elpidio Anis, PA-C    Allergies    Patient has no known allergies.  Review of Systems   Review of Systems  Constitutional: Negative for fever.  Musculoskeletal: Positive for arthralgias and myalgias. Negative for joint swelling, neck pain and neck stiffness.  Skin: Negative for color change, rash and wound.  Allergic/Immunologic: Negative for immunocompromised state.  Neurological: Positive for weakness and numbness.    Physical Exam Updated Vital Signs BP [name])  143/83 (BP Location: Left Arm)   Pulse 63   Temp 97.9 F (36.6 C) (Oral)   Resp 20   SpO2 100%   Physical Exam Vitals and nursing note reviewed.  Constitutional:      General: He is not in acute distress.    Appearance: He is well-developed. He is not diaphoretic.  HENT:     Head: Normocephalic and atraumatic.  Cardiovascular:     Pulses: Normal pulses.  Pulmonary:     Effort: Pulmonary effort is normal.  Musculoskeletal:        General: Tenderness present. No swelling, deformity or signs of injury.       Arms:     Cervical back: Neck supple. No tenderness.      Comments: No particular tenderness to either flexor or extensor compartment of the forearm. Grip strength weak through fingers 3-5. Reports pins/needles sensation to ulnar aspect of forearm. Able to fully flex and extend the elbow.  Skin:    General: Skin is warm and dry.     Findings: No erythema or rash.  Neurological:     Mental Status: He is alert and oriented to person, place, and time.  Psychiatric:        Behavior: Behavior normal.     ED Results / Procedures / Treatments   Labs (all labs ordered are listed, but only abnormal results are displayed) Labs Reviewed - No data to display  EKG None  Radiology DG Elbow Complete Right  Result Date: 08/23/2019 CLINICAL DATA:  Atraumatic elbow pain. EXAM: RIGHT ELBOW - COMPLETE 3+ VIEW COMPARISON:  None. FINDINGS: There is no evidence of fracture, dislocation, or joint effusion. There is no evidence of arthropathy or other focal bone abnormality. Soft tissues are unremarkable. IMPRESSION: Negative. Electronically Signed   By: Monte Fantasia M.D.   On: 08/23/2019 07:10    Procedures Procedures (including critical care time)  Medications Ordered in ED Medications - No data to display  ED Course  I have reviewed the triage vital signs and the nursing notes.  Pertinent labs & imaging results that were available during my care of the patient were reviewed by me and considered in my medical decision making (see chart for details).  Clinical Course as of Aug 22 944  Tue Aug 22, 9844  2240 34 year old male with complaint of right elbow pain for several weeks, progressively worsening, pain radiates from the medial elbow to right fourth and fifth fingers, reports pins and needle sensation along the ulnar aspect of the forearm as well as weakness in the fourth and fifth fingers.  On exam has generalized posterior right elbow tenderness, more so medially.  Noted to have weak grip strength through third through fifth fingers and altered  sensation is reported.  X-ray of the elbow is unremarkable.  No upper arm weakness or symptoms.  Suspect possible cubital tunnel syndrome, referred to orthopedics.  Given prescription for Flexeril and naproxen and encouraged follow-up.   [LM]    Clinical Course User Index [LM] Roque Lias   MDM Rules/Calculators/A&P                      Final Clinical Impression(s) / ED Diagnoses Final diagnoses:  Cubital tunnel syndrome on right    Rx / DC Orders ED Discharge Orders         Ordered    cyclobenzaprine (FLEXERIL) 10 MG tablet  2 times daily PRN     08/23/19 0911  naproxen (NAPROSYN) 500 MG tablet  2 times daily     08/23/19 0911           Jeannie Fend, PA-C 08/23/19 0946    Melene Plan, DO 08/23/19 1102

## 2019-09-06 ENCOUNTER — Other Ambulatory Visit: Payer: Self-pay | Admitting: Physician Assistant

## 2019-12-02 ENCOUNTER — Ambulatory Visit: Payer: Self-pay | Admitting: Diagnostic Neuroimaging

## 2020-01-03 ENCOUNTER — Encounter: Payer: Self-pay | Admitting: *Deleted

## 2020-01-04 ENCOUNTER — Ambulatory Visit: Payer: Self-pay | Admitting: Diagnostic Neuroimaging

## 2020-01-04 ENCOUNTER — Encounter: Payer: Self-pay | Admitting: Diagnostic Neuroimaging

## 2020-01-04 ENCOUNTER — Telehealth: Payer: Self-pay | Admitting: *Deleted

## 2020-01-04 NOTE — Telephone Encounter (Signed)
Patient was no show for new patient appointment today. MD reviewed notes and advised he may be rescheduled for EMG/NCS only. Referral papers given to [facility], EMG tech to schedule. Joni Reining verbalized understanding, appreciation.

## 2020-02-15 ENCOUNTER — Encounter (HOSPITAL_COMMUNITY): Payer: Self-pay

## 2020-02-15 ENCOUNTER — Emergency Department (HOSPITAL_COMMUNITY)
Admission: EM | Admit: 2020-02-15 | Discharge: 2020-02-15 | Disposition: A | Discharge status: Home / Self Care | Payer: Self-pay | Attending: Emergency Medicine | Admitting: Emergency Medicine

## 2020-02-15 DIAGNOSIS — F17210 Nicotine dependence, cigarettes, uncomplicated: Secondary | ICD-10-CM | POA: Insufficient documentation

## 2020-02-15 DIAGNOSIS — K2950 Unspecified chronic gastritis without bleeding: Secondary | ICD-10-CM

## 2020-02-15 DIAGNOSIS — K2971 Gastritis, unspecified, with bleeding: Secondary | ICD-10-CM | POA: Insufficient documentation

## 2020-02-15 LAB — CBC WITH DIFFERENTIAL/PLATELET
Abs Immature Granulocytes: 0.03 10*3/uL (ref 0.00–0.07)
Basophils Absolute: 0 10*3/uL (ref 0.0–0.1)
Basophils Relative: 0 %
Eosinophils Absolute: 0 10*3/uL (ref 0.0–0.5)
Eosinophils Relative: 0 %
HCT: 38.7 % — ABNORMAL LOW (ref 39.0–52.0)
Hemoglobin: 14 g/dL (ref 13.0–17.0)
Immature Granulocytes: 0 %
Lymphocytes Relative: 18 %
Lymphs Abs: 2 10*3/uL (ref 0.7–4.0)
MCH: 29.9 pg (ref 26.0–34.0)
MCHC: 36.2 g/dL — ABNORMAL HIGH (ref 30.0–36.0)
MCV: 82.7 fL (ref 80.0–100.0)
Monocytes Absolute: 0.8 10*3/uL (ref 0.1–1.0)
Monocytes Relative: 7 %
Neutro Abs: 8.2 10*3/uL — ABNORMAL HIGH (ref 1.7–7.7)
Neutrophils Relative %: 75 %
Platelets: 435 10*3/uL — ABNORMAL HIGH (ref 150–400)
RBC: 4.68 MIL/uL (ref 4.22–5.81)
RDW: 13.3 % (ref 11.5–15.5)
WBC: 11.1 10*3/uL — ABNORMAL HIGH (ref 4.0–10.5)
nRBC: 0 % (ref 0.0–0.2)

## 2020-02-15 LAB — COMPREHENSIVE METABOLIC PANEL
ALT: 18 U/L (ref 0–44)
AST: 17 U/L (ref 15–41)
Albumin: 4.7 g/dL (ref 3.5–5.0)
Alkaline Phosphatase: 91 U/L (ref 38–126)
Anion gap: 15 (ref 5–15)
BUN: 15 mg/dL (ref 6–20)
CO2: 25 mmol/L (ref 22–32)
Calcium: 9.9 mg/dL (ref 8.9–10.3)
Chloride: 97 mmol/L — ABNORMAL LOW (ref 98–111)
Creatinine, Ser: 0.94 mg/dL (ref 0.61–1.24)
GFR calc non Af Amer: >60 mL/min (ref >60)
Glucose, Bld: 116 mg/dL — ABNORMAL HIGH (ref 70–99)
Potassium: 3.9 mmol/L (ref 3.5–5.1)
Sodium: 137 mmol/L (ref 135–145)
Total Bilirubin: 1.2 mg/dL (ref 0.3–1.2)
Total Protein: 8.9 g/dL — ABNORMAL HIGH (ref 6.5–8.1)

## 2020-02-15 LAB — POC OCCULT BLOOD, ED: Fecal Occult Bld: NEGATIVE

## 2020-02-15 LAB — LIPASE, BLOOD: Lipase: 23 U/L (ref 11–51)

## 2020-02-15 MED ORDER — SUCRALFATE 1 G PO TABS: 1.0000 g | ORAL_TABLET | Freq: Three times a day (TID) | ORAL | 0 refills | Status: DC

## 2020-02-15 MED ORDER — MORPHINE SULFATE (PF) 4 MG/ML IV SOLN
4.0000 mg | Freq: Once | INTRAVENOUS | Status: AC
  Administered 2020-02-15: 4 mg via INTRAVENOUS
  Filled 2020-02-15: qty 1

## 2020-02-15 MED ORDER — PANTOPRAZOLE SODIUM 40 MG IV SOLR
40.0000 mg | Freq: Once | INTRAVENOUS | Status: AC
  Administered 2020-02-15: 40 mg via INTRAVENOUS
  Filled 2020-02-15: qty 40

## 2020-02-15 MED ORDER — ALUM & MAG HYDROXIDE-SIMETH 200-200-20 MG/5ML PO SUSP
30.0000 mL | Freq: Once | ORAL | Status: AC
  Administered 2020-02-15: 30 mL via ORAL
  Filled 2020-02-15: qty 30

## 2020-02-15 MED ORDER — SODIUM CHLORIDE 0.9 % IV BOLUS
1000.0000 mL | Freq: Once | INTRAVENOUS | Status: AC
  Administered 2020-02-15: 1000 mL via INTRAVENOUS

## 2020-02-15 MED ORDER — ONDANSETRON 4 MG PO TBDP: 4.0000 mg | ORAL_TABLET | Freq: Three times a day (TID) | ORAL | 0 refills | Status: DC | PRN

## 2020-02-15 MED ORDER — ONDANSETRON HCL 4 MG/2ML IJ SOLN
4.0000 mg | Freq: Once | INTRAMUSCULAR | Status: AC
  Administered 2020-02-15: 4 mg via INTRAVENOUS
  Filled 2020-02-15: qty 2

## 2020-02-15 MED ORDER — SUCRALFATE 1 G PO TABS
1.0000 g | ORAL_TABLET | Freq: Once | ORAL | Status: AC
  Administered 2020-02-15: 1 g via ORAL
  Filled 2020-02-15: qty 1

## 2020-02-15 NOTE — Discharge Instructions (Addendum)
You were seen today for recurrent abdominal pain and vomiting.  This is likely related to your known gastritis.  Avoid anti-inflammatory medications and alcohol.  Continue your Protonix.  Take Carafate and Zofran as needed.

## 2020-02-15 NOTE — ED Provider Notes (Signed)
[facility]-EMERGENCY DEPT Provider Note   CSN: [id number] Arrival date & time: 02/15/20  4098     History No chief complaint on file.   Gabriel Dalton is a 34 y.o. male.  HPI     This a 34 year old male with a history of chronic abdominal pain, gastritis, marijuana abuse who presents with abdominal pain and vomiting.  Patient reports 2 to 3-day history of upper abdominal pain that is sharp and nonradiating.  Patient describes it "grabbing at him."  He rates his pain at 10 out of 10.  Nothing seems to make it better or worse.  He reports multiple episodes of nonbilious, nonbloody emesis.  However, he does report some dark tarry stools.  Reports that he continues to take Protonix as directed by his GI doctor, Dr. Lavon Paganini.  He also continues to smoke marijuana but "not as much as I used to."  Denies any alcohol use.  Past Medical History:  Diagnosis Date  . Bronchitis     Patient Active Problem List   Diagnosis Date Noted  . Intractable abdominal pain 04/25/2019  . Hyperbilirubinemia 04/25/2019  . Anemia 04/25/2019  . Leukocytosis 04/25/2019  . Gastric ulcers 04/25/2019  . Ulcerated mucosa of rectum 04/25/2019  . Melena 04/25/2019  . Generalized abdominal pain 04/01/2019  . Diarrhea 04/01/2019  . Loss of weight 04/01/2019  . Screening for viral disease 04/01/2019  . Black stools 04/01/2019    Past Surgical History:  Procedure Laterality Date  . I & D EXTREMITY  10/18/2011   Procedure: IRRIGATION AND DEBRIDEMENT EXTREMITY;  Surgeon: Dominica Severin, MD;  Location: [facility] OR;  Service: Orthopedics;  Laterality: Right;  Irrigation and Debridement  Right Middle Finger; Removal of foreign body  . NO PAST SURGERIES         Family History  Problem Relation Age of Onset  . Healthy Mother   . Hypertension Father   . Colon cancer Maternal Uncle   . Rectal cancer Maternal Uncle   . Brain cancer Maternal Uncle   . Heart disease Maternal Aunt   .  Esophageal cancer Neg Hx   . Stomach cancer Neg Hx     Social History   Tobacco Use  . Smoking status: Current Every Day Smoker    Packs/day: 0.50  . Smokeless tobacco: Never Used  Vaping Use  . Vaping Use: Former  Substance Use Topics  . Alcohol use: Yes    Comment: occasional  . Drug use: Not Currently    Home Medications Prior to Admission medications   Medication Sig Start Date End Date Taking? Authorizing Provider  cyclobenzaprine (FLEXERIL) 10 MG tablet Take 1 tablet (10 mg total) by mouth 2 (two) times daily as needed for muscle spasms. 08/23/19   Jeannie Fend, PA-C  naproxen (NAPROSYN) 500 MG tablet Take 1 tablet (500 mg total) by mouth 2 (two) times daily. 08/23/19   Jeannie Fend, PA-C  pantoprazole (PROTONIX) 40 MG tablet Take 1 tablet (40 mg total) by mouth 2 (two) times daily. 06/01/19   Esterwood, Amy S, PA-C  metoCLOPramide (REGLAN) 10 MG tablet Take 1 tablet (10 mg total) by mouth every 6 (six) hours. Patient taking differently: Take 10 mg by mouth every 6 (six) hours as needed for nausea or vomiting.  04/24/19 04/28/19  Elpidio Anis, PA-C    Allergies    Patient has no known allergies.  Review of Systems   Review of Systems  Constitutional: Negative for fever.  Respiratory: Negative  for shortness of breath.   Cardiovascular: Negative for chest pain.  Gastrointestinal: Positive for abdominal pain, nausea and vomiting.  Genitourinary: Negative for dysuria.  All other systems reviewed and are negative.   Physical Exam Updated Vital Signs BP 129/82   Pulse 83   Temp 98.1 F (36.7 C) (Oral)   Resp 18   Ht 1.727 m (5\' 8" )   Wt 59 kg   SpO2 98%   BMI 19.77 kg/m   Physical Exam Vitals and nursing note reviewed.  Constitutional:      Appearance: He is well-developed. He is not ill-appearing.  HENT:     Head: Normocephalic and atraumatic.     Nose: Nose normal.     Mouth/Throat:     Mouth: Mucous membranes are moist.  Eyes:     Pupils: Pupils  are equal, round, and reactive to light.  Cardiovascular:     Rate and Rhythm: Normal rate and regular rhythm.     Heart sounds: Normal heart sounds. No murmur heard.   Pulmonary:     Effort: Pulmonary effort is normal. No respiratory distress.     Breath sounds: Normal breath sounds. No wheezing.  Abdominal:     General: Bowel sounds are normal.     Palpations: Abdomen is soft.     Tenderness: There is abdominal tenderness. There is no rebound.     Comments: Epigastric tenderness to palpation, no rebound or guarding  Genitourinary:    Rectum: Guaiac result negative.     Comments: No gross blood Musculoskeletal:     Cervical back: Neck supple.     Right lower leg: No edema.     Left lower leg: No edema.  Lymphadenopathy:     Cervical: No cervical adenopathy.  Skin:    General: Skin is warm and dry.  Neurological:     Mental Status: He is alert and oriented to person, place, and time.  Psychiatric:        Mood and Affect: Mood normal.     ED Results / Procedures / Treatments   Labs (all labs ordered are listed, but only abnormal results are displayed) Labs Reviewed  CBC WITH DIFFERENTIAL/PLATELET - Abnormal; Notable for the following components:      Result Value   WBC 11.1 (*)    HCT 38.7 (*)    MCHC 36.2 (*)    Platelets 435 (*)    Neutro Abs 8.2 (*)    All other components within normal limits  COMPREHENSIVE METABOLIC PANEL - Abnormal; Notable for the following components:   Chloride 97 (*)    Glucose, Bld 116 (*)    Total Protein 8.9 (*)    All other components within normal limits  LIPASE, BLOOD  POC OCCULT BLOOD, ED    EKG None  Radiology No results found.  Procedures Procedures (including critical care time)  Medications Ordered in ED Medications  pantoprazole (PROTONIX) injection 40 mg (40 mg Intravenous Given 02/15/20 0436)  morphine 4 MG/ML injection 4 mg (4 mg Intravenous Given 02/15/20 0436)  ondansetron (ZOFRAN) injection 4 mg (4 mg  Intravenous Given 02/15/20 0436)  sodium chloride 0.9 % bolus 1,000 mL (1,000 mLs Intravenous New Bag/Given 02/15/20 0436)  sucralfate (CARAFATE) tablet 1 g (1 g Oral Given 02/15/20 0507)  alum & mag hydroxide-simeth (MAALOX/MYLANTA) 200-200-20 MG/5ML suspension 30 mL (30 mLs Oral Given 02/15/20 0507)    ED Course  I have reviewed the triage vital signs and the nursing notes.  Pertinent labs &  imaging results that were available during my care of the patient were reviewed by me and considered in my medical decision making (see chart for details).    MDM Rules/Calculators/A&P                          Patient presents with abdominal pain.  He is overall nontoxic-appearing.  Mildly tachycardic.  He has some epigastric tenderness to palpation.  Considerations include but not limited to, ongoing gastritis, peptic ulcer, pancreatitis, cholecystitis.  Patient also continues to smoke marijuana and may have some element of cannabinoid hyperemesis.  Chart was reviewed and he had an endoscopy in December which showed gastritis.  At that time it was recommended that he avoid NSAIDs and continue Protonix.  Lab work obtained.  Patient given pain and nausea medication as well as a GI cocktail.  He has no evidence of active GI bleed on exam.  He was given IV Protonix.  Lab work is reassuring including normal hemoglobin, Hemoccult negative, no significant metabolic derangements.  On recheck, he states some improvement with medications.  Will add Carafate and Zofran to his regimen.  Patient is able to tolerate fluids.  Recommend following up with GI.  Avoid NSAIDs.  After history, exam, and medical workup I feel the patient has been appropriately medically screened and is safe for discharge home. Pertinent diagnoses were discussed with the patient. Patient was given return precautions.   Final Clinical Impression(s) / ED Diagnoses Final diagnoses:  Chronic gastritis, presence of bleeding unspecified, unspecified  gastritis type    Rx / DC Orders ED Discharge Orders    None       Shon Baton, MD 02/15/20 [phone]

## 2020-02-15 NOTE — ED Triage Notes (Signed)
Pt complains of vomiting and having blood in his stools He states the blood is dark red and he can't keep anything down not even water

## 2020-02-15 NOTE — ED Notes (Signed)
Patient tolerated a cup of water.

## 2020-08-07 IMAGING — DX DG ABDOMEN 2V
2 series · 2 of 2 positions shown · non-contrast
Comparison: 04/23/2019

CLINICAL DATA: Abdominal pain and bloating following colonoscopy
and endoscopy.

EXAM:
ABDOMEN - 2 VIEW

[abdomen erect]
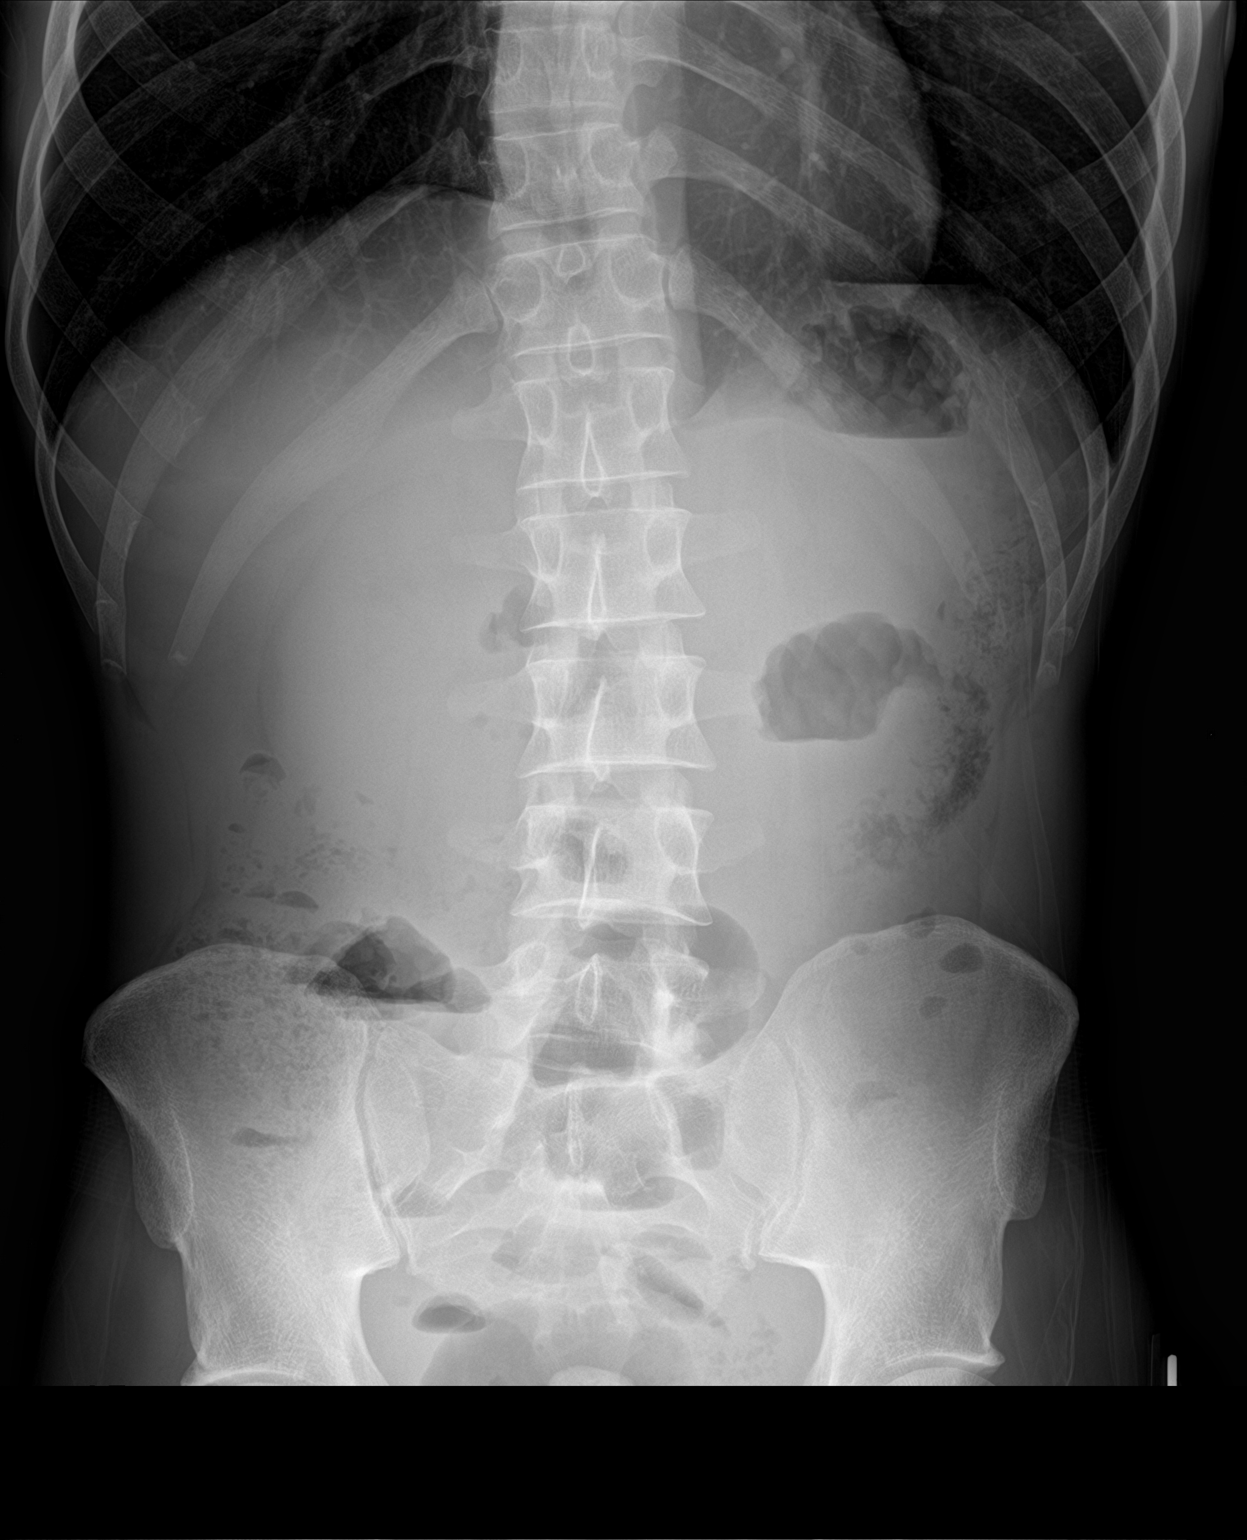

[abdomen supine]
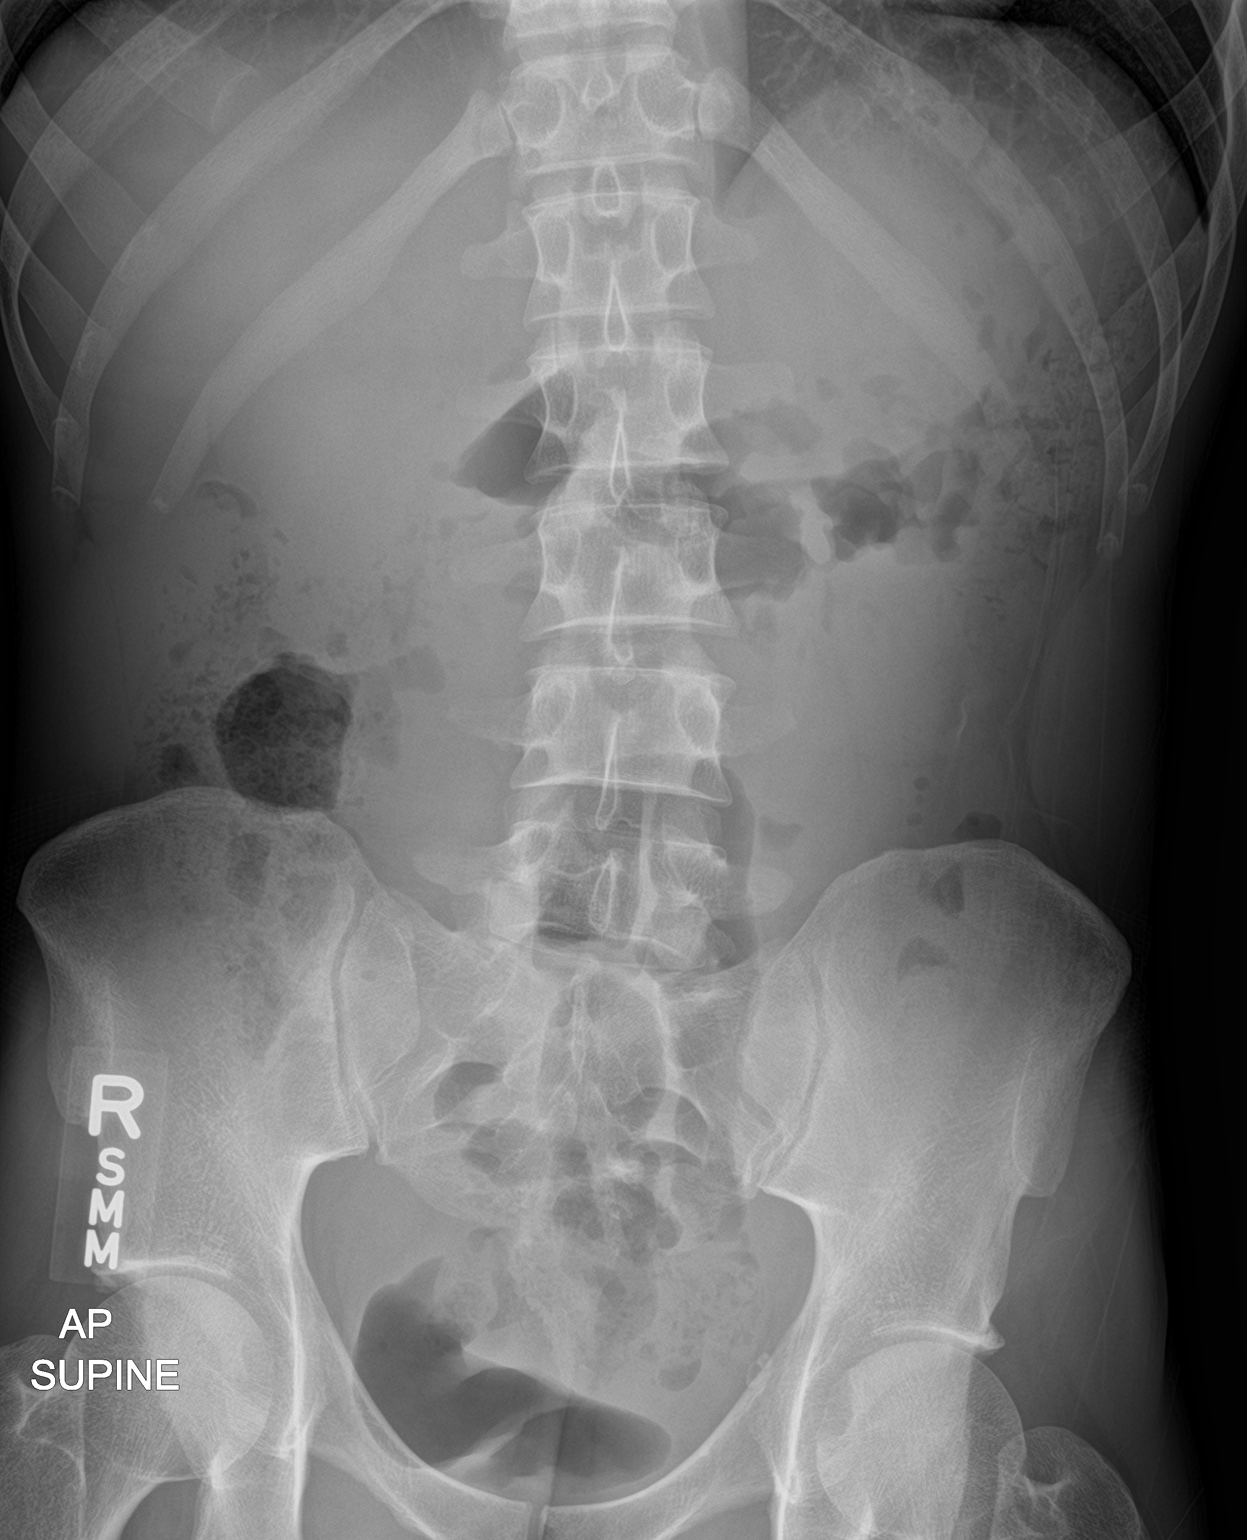

[2 of 2 positions shown; findings below may reference images not displayed]

FINDINGS: Bowel gas pattern is within normal limits without evidence of ileus,
obstruction or abnormal stool burden. No significant calcifications
or bone findings.
IMPRESSION: Negative radiographs presently.

## 2020-11-11 ENCOUNTER — Emergency Department (HOSPITAL_COMMUNITY): Payer: BC Managed Care – PPO

## 2020-11-11 ENCOUNTER — Emergency Department (HOSPITAL_COMMUNITY)
Admission: EM | Admit: 2020-11-11 | Discharge: 2020-11-11 | Disposition: A | Discharge status: Home / Self Care | Payer: BC Managed Care – PPO | Attending: Emergency Medicine | Admitting: Emergency Medicine

## 2020-11-11 ENCOUNTER — Other Ambulatory Visit: Payer: Self-pay

## 2020-11-11 ENCOUNTER — Encounter (HOSPITAL_COMMUNITY): Payer: Self-pay

## 2020-11-11 DIAGNOSIS — K5900 Constipation, unspecified: Secondary | ICD-10-CM | POA: Diagnosis present

## 2020-11-11 DIAGNOSIS — R000 Tachycardia, unspecified: Secondary | ICD-10-CM | POA: Insufficient documentation

## 2020-11-11 DIAGNOSIS — F17210 Nicotine dependence, cigarettes, uncomplicated: Secondary | ICD-10-CM | POA: Insufficient documentation

## 2020-11-11 DIAGNOSIS — R110 Nausea: Secondary | ICD-10-CM | POA: Diagnosis not present

## 2020-11-11 DIAGNOSIS — R1013 Epigastric pain: Secondary | ICD-10-CM | POA: Insufficient documentation

## 2020-11-11 LAB — POC OCCULT BLOOD, ED: Fecal Occult Bld: NEGATIVE

## 2020-11-11 LAB — COMPREHENSIVE METABOLIC PANEL
ALT: 18 U/L (ref 0–44)
AST: 18 U/L (ref 15–41)
Albumin: 4.7 g/dL (ref 3.5–5.0)
Alkaline Phosphatase: 91 U/L (ref 38–126)
Anion gap: 13 (ref 5–15)
BUN: 20 mg/dL (ref 6–20)
CO2: 28 mmol/L (ref 22–32)
Calcium: 10.1 mg/dL (ref 8.9–10.3)
Chloride: 99 mmol/L (ref 98–111)
Creatinine, Ser: 0.91 mg/dL (ref 0.61–1.24)
GFR, Estimated: >60 mL/min (ref >60)
Glucose, Bld: 112 mg/dL — ABNORMAL HIGH (ref 70–99)
Potassium: 4.1 mmol/L (ref 3.5–5.1)
Sodium: 140 mmol/L (ref 135–145)
Total Bilirubin: 1.2 mg/dL (ref 0.3–1.2)
Total Protein: 9.1 g/dL — ABNORMAL HIGH (ref 6.5–8.1)

## 2020-11-11 LAB — LIPASE, BLOOD: Lipase: 23 U/L (ref 11–51)

## 2020-11-11 LAB — CBC
HCT: 44.8 % (ref 39.0–52.0)
Hemoglobin: 15.1 g/dL (ref 13.0–17.0)
MCH: 29.2 pg (ref 26.0–34.0)
MCHC: 33.7 g/dL (ref 30.0–36.0)
MCV: 86.5 fL (ref 80.0–100.0)
Platelets: 439 10*3/uL — ABNORMAL HIGH (ref 150–400)
RBC: 5.18 MIL/uL (ref 4.22–5.81)
RDW: 14.8 % (ref 11.5–15.5)
WBC: 16.2 10*3/uL — ABNORMAL HIGH (ref 4.0–10.5)
nRBC: 0 % (ref 0.0–0.2)

## 2020-11-11 MED ORDER — FENTANYL CITRATE (PF) 100 MCG/2ML IJ SOLN
50.0000 ug | INTRAMUSCULAR | Status: DC | PRN
  Administered 2020-11-11: 50 ug via INTRAVENOUS
  Filled 2020-11-11 (×2): qty 2

## 2020-11-11 MED ORDER — SODIUM CHLORIDE 0.9 % IV BOLUS
1000.0000 mL | Freq: Once | INTRAVENOUS | Status: AC
  Administered 2020-11-11: 1000 mL via INTRAVENOUS

## 2020-11-11 MED ORDER — METOCLOPRAMIDE HCL 10 MG PO TABS: 10.0000 mg | ORAL_TABLET | Freq: Four times a day (QID) | ORAL | 0 refills | Status: DC

## 2020-11-11 MED ORDER — FAMOTIDINE 20 MG PO TABS: 20.0000 mg | ORAL_TABLET | Freq: Two times a day (BID) | ORAL | 0 refills | Status: DC

## 2020-11-11 MED ORDER — IOHEXOL 350 MG/ML SOLN
75.0000 mL | Freq: Once | INTRAVENOUS | Status: AC | PRN
  Administered 2020-11-11: 75 mL via INTRAVENOUS

## 2020-11-11 MED ORDER — FAMOTIDINE IN NACL 20-0.9 MG/50ML-% IV SOLN
20.0000 mg | Freq: Once | INTRAVENOUS | Status: AC
  Administered 2020-11-11: 20 mg via INTRAVENOUS
  Filled 2020-11-11: qty 50

## 2020-11-11 MED ORDER — SUCRALFATE 1 G PO TABS: 1.0000 g | ORAL_TABLET | Freq: Three times a day (TID) | ORAL | 0 refills | Status: DC

## 2020-11-11 NOTE — ED Notes (Signed)
Pt in bed resting, family at bedside. Respirations even and unlabored.

## 2020-11-11 NOTE — Discharge Instructions (Addendum)
Please drink plenty of water.  Please use medications I prescribed it which included Reglan you may take this up to every 6 hours as needed for nausea.  I recommend no solid food for the next 24-48 hours and drink plenty of water you may drink soup, stay hydrated and rest.  I also recommend taking Pepcid which I have prescribed you.  You may then gradually begin a more bland diet with rice, banana, applesauce, toast

## 2020-11-11 NOTE — ED Provider Notes (Signed)
[facility]-EMERGENCY DEPT Provider Note   CSN: [id number] Arrival date & time: 11/11/20  0850     History Chief Complaint  Patient presents with   Constipation   Abdominal Pain   Emesis    Tejay Dorrell Rampy is a 35 y.o. male.  HPI Deacon Gadbois is a 35 year old male with a past medical history of PUD who presents to the ED for constipation and abdominal pain. The abdominal pain started Friday and has progressively gotten worse. The pain is colicky in nature and is described as diffuse over the abdomen. Last bowel movement was Friday. The patient has had a similar pain over the past two years that has been alleviated with Dulcolax. Today, the Dulcolax did not alleviate the pain and this is what prompted the patient to present to the ER. His pain is a 10/10 severity. He has not been able to eat, or drink recently due to the pain. The patient had one episode of hematochezia that occurred on Friday. He denies fever, chest pain, or palpitations. Diet consists of grilled chicken and does not include leafy greens per the gastroenterologists recommendations. The patient underwent a colonoscopy and EGD 2 years ago that showed ulcers and was negative for colon cancer. He discontinued the prescribed Naproxen, Omeprazole,Sucralfate a few months ago due to constipation.       Past Medical History:  Diagnosis Date   Bronchitis     Patient Active Problem List   Diagnosis Date Noted   Intractable abdominal pain 04/25/2019   Hyperbilirubinemia 04/25/2019   Anemia 04/25/2019   Leukocytosis 04/25/2019   Gastric ulcers 04/25/2019   Ulcerated mucosa of rectum 04/25/2019   Melena 04/25/2019   Generalized abdominal pain 04/01/2019   Diarrhea 04/01/2019   Loss of weight 04/01/2019   Screening for viral disease 04/01/2019   Black stools 04/01/2019    Past Surgical History:  Procedure Laterality Date   COLONOSCOPY     I & D EXTREMITY  10/18/2011   Procedure:  IRRIGATION AND DEBRIDEMENT EXTREMITY;  Surgeon: Dominica Severin, MD;  Location: [facility] OR;  Service: Orthopedics;  Laterality: Right;  Irrigation and Debridement  Right Middle Finger; Removal of foreign body   NO PAST SURGERIES         Family History  Problem Relation Age of Onset   Healthy Mother    Hypertension Father    Colon cancer Maternal Uncle    Rectal cancer Maternal Uncle    Brain cancer Maternal Uncle    Heart disease Maternal Aunt    Esophageal cancer Neg Hx    Stomach cancer Neg Hx     Social History   Tobacco Use   Smoking status: Every Day    Packs/day: 0.50    Pack years: 0.00    Types: Cigarettes   Smokeless tobacco: Never  Vaping Use   Vaping Use: Former  Substance Use Topics   Alcohol use: Not Currently   Drug use: Not Currently    Home Medications Prior to Admission medications   Medication Sig Start Date End Date Taking? Authorizing Provider  acetaminophen (TYLENOL) 325 MG tablet Take 650 mg by mouth every 6 (six) hours as needed for mild pain, fever or headache.   Yes [provider]  famotidine (PEPCID) 20 MG tablet Take 1 tablet (20 mg total) by mouth 2 (two) times daily. 11/11/20  Yes Priscilla Kirstein S, PA  metoCLOPramide (REGLAN) 10 MG tablet Take 1 tablet (10 mg total) by mouth every 6 (  six) hours. 11/11/20  Yes Abel Hageman S, PA  sucralfate (CARAFATE) 1 g tablet Take 1 tablet (1 g total) by mouth 4 (four) times daily -  with meals and at bedtime. 11/11/20  Yes Gailen Shelter, PA    Allergies    Patient has no known allergies.  Review of Systems   Review of Systems  Constitutional:  Negative for chills and fever.  HENT:  Negative for congestion.   Respiratory:  Negative for shortness of breath.   Cardiovascular:  Negative for chest pain.  Gastrointestinal:  Positive for abdominal pain and constipation.  Musculoskeletal:  Negative for neck pain.   Physical Exam Updated Vital Signs BP (!) 147/97   Pulse 76   Temp 98.6 F (37 C)  (Oral)   Resp 16   Ht 5\' 8"  (1.727 m)   Wt 59 kg   SpO2 99%   BMI 19.77 kg/m   Physical Exam Vitals and nursing note reviewed.  Constitutional:      Appearance: He is not ill-appearing.  HENT:     Head: Normocephalic and atraumatic.     Nose: Nose normal.  Eyes:     General: No scleral icterus. Cardiovascular:     Rate and Rhythm: Normal rate and regular rhythm.     Pulses: Normal pulses.     Heart sounds: Normal heart sounds.  Pulmonary:     Effort: Pulmonary effort is normal. No respiratory distress.     Breath sounds: No wheezing.  Abdominal:     Palpations: Abdomen is soft.     Tenderness: There is abdominal tenderness. There is no right CVA tenderness, left CVA tenderness, guarding or rebound.     Comments: Diffuse tenderness to palpation of the abdomen.  No focal abdominal tenderness apart from some worse tenderness to palpation in the right lower quadrant.  No guarding or rebound.  Genitourinary:    Comments: Small nonthrombosed hemorrhoid no significant tenderness to palpation.  Rectal vault is empty.  No melena or hematochezia.  Soft brown stool. Musculoskeletal:     Cervical back: Normal range of motion.     Right lower leg: No edema.     Left lower leg: No edema.  Skin:    General: Skin is warm and dry.     Capillary Refill: Capillary refill takes less than 2 seconds.  Neurological:     Mental Status: He is alert. Mental status is at baseline.  Psychiatric:        Mood and Affect: Mood normal.        Behavior: Behavior normal.   ED Results / Procedures / Treatments   Labs (all labs ordered are listed, but only abnormal results are displayed) Labs Reviewed  COMPREHENSIVE METABOLIC PANEL - Abnormal; Notable for the following components:      Result Value   Glucose, Bld 112 (*)    Total Protein 9.1 (*)    All other components within normal limits  CBC - Abnormal; Notable for the following components:   WBC 16.2 (*)    Platelets 439 (*)    All other  components within normal limits  LIPASE, BLOOD  URINALYSIS, ROUTINE W REFLEX MICROSCOPIC  POC OCCULT BLOOD, ED    EKG None  Radiology CT ABDOMEN PELVIS W CONTRAST  Result Date: 11/11/2020 CLINICAL DATA:  Rule out appendicitis. Right lower quadrant abdominal pain. EXAM: CT ABDOMEN AND PELVIS WITH CONTRAST TECHNIQUE: Multidetector CT imaging of the abdomen and pelvis was performed using the standard protocol following  bolus administration of intravenous contrast. CONTRAST:  75mL OMNIPAQUE IOHEXOL 350 MG/ML SOLN COMPARISON:  04/22/2019 FINDINGS: Lower chest: No acute abnormality. Hepatobiliary: No focal liver abnormality is seen. No gallstones, gallbladder wall thickening, or biliary dilatation. Pancreas: Unremarkable. No pancreatic ductal dilatation or surrounding inflammatory changes. Spleen: Normal in size without focal abnormality. Adrenals/Urinary Tract: Adrenal glands are normal. No suspicious mass or hydronephrosis identified bilaterally. Tiny 5 mm cortical hypodensity within the lateral cortex of the upper pole of left kidney is too small to characterize, image 22/2. The urinary bladder appears normal. Stomach/Bowel: Stomach appears normal. Lack of substantial visceral fat diminishes sensitivity for identifying the appendix separate from the right lower quadrant bowel loops. What is felt to most likely represent the appendix appears normal without wall thickening or inflammation, image 70/5. There is liquid stool identified throughout the colon up to the level of the rectum with air-fluid levels. No significant bowel wall thickening, inflammation or distension. Vascular/Lymphatic: A few atherosclerotic calcifications noted within the wall of the aorta. No aneurysm. Upper abdominal vasculature appears patent. No abdominopelvic adenopathy. Reproductive: Prostate is unremarkable. Other: No free fluid or fluid collections. Musculoskeletal: No acute or significant osseous findings. IMPRESSION: 1. No  findings identified to suggest acute appendicitis. 2. There is liquid stool identified throughout the colon up to the level of the rectum with air-fluid levels. Correlate for any clinical signs or symptoms of diarrheal illness. 3. Aortic atherosclerosis. Aortic Atherosclerosis (ICD10-I70.0). Electronically Signed   By: Signa Kellaylor  Stroud M.D.   On: 11/11/2020 12:44    Procedures Procedures   Medications Ordered in ED Medications  fentaNYL (SUBLIMAZE) injection 50 mcg (50 mcg Intravenous Given 11/11/20 1155)  sodium chloride 0.9 % bolus 1,000 mL (0 mLs Intravenous Stopped 11/11/20 1346)  famotidine (PEPCID) IVPB 20 mg premix (0 mg Intravenous Stopped 11/11/20 1256)  iohexol (OMNIPAQUE) 350 MG/ML injection 75 mL (75 mLs Intravenous Contrast Given 11/11/20 1216)    ED Course  I have reviewed the triage vital signs and the nursing notes.  Pertinent labs & imaging results that were available during my care of the patient were reviewed by me and considered in my medical decision making (see chart for details).  Clinical Course as of 11/11/20 1703  Wynelle LinkSun Nov 11, 2020  1324 Some concern for ileus. IMPRESSION: 1. No findings identified to suggest acute appendicitis. 2. There is liquid stool identified throughout the colon up to the level of the rectum with air-fluid levels. Correlate for any clinical signs or symptoms of diarrheal illness. 3. Aortic atherosclerosis. [WF]    Clinical Course User Index [WF] Gailen ShelterFondaw, Ariv Penrod S, [place]PA   MDM Rules/Calculators/A&P                          Patient is a 35 year old male with past medical history notable for constipation, IBD, stomach ulcers.  Patient has had ongoing worsening abdominal pain came to the ER with tachycardia of 110 found to have leukocytosis no evidence of anemia.  CMP unremarkable lipase within normal limits fecal occult was negative.  Physical exam is notable for diffuse tenderness.  Seems to have some right lower quadrant tenderness worsen  elsewhere.  Will obtain CT abdomen pelvis.  See abdomen pelvis reviewed above.  He has not had any diarrhea and has no findings of acute appendicitis.  The bloody stool that he described having on Friday seems to have resolved.  On reassessment he is not having any pain after 1 dose of fentanyl along  with fluids, Pepcid.  Repeat abdominal exam is without any significant tenderness.  Patient tolerated p.o.  Discharged home with Reglan, sucralfate and Pepcid the last 2 medications he has been prescribed by GI in the past.  We will follow-up with [facility] return precautions given.  Ambulatory time of discharge.  Discussed case with my attending physician prior to discharge.  Final Clinical Impression(s) / ED Diagnoses Final diagnoses:  Epigastric pain  Nausea    Rx / DC Orders ED Discharge Orders          Ordered    metoCLOPramide (REGLAN) 10 MG tablet  Every 6 hours        11/11/20 1428    sucralfate (CARAFATE) 1 g tablet  3 times daily with meals & bedtime        11/11/20 1428    famotidine (PEPCID) 20 MG tablet  2 times daily        11/11/20 1428             Solon Augusta Hammett, [place] 11/11/20 1706    Terald Sleeper, MD 11/12/20 [phone]

## 2020-11-11 NOTE — ED Triage Notes (Signed)
Patient reports that he has been having constipation and last BM was 2 days ago and patient noted light pink blood on the stool. Patient states he has been taking dulcolax, but has been vomiting right back up. Patient states that he has abdominal pain and feels bloated.

## 2020-11-14 ENCOUNTER — Emergency Department (HOSPITAL_BASED_OUTPATIENT_CLINIC_OR_DEPARTMENT_OTHER)
Admission: EM | Admit: 2020-11-14 | Discharge: 2020-11-14 | Discharge status: Absent without leave | Payer: BC Managed Care – PPO

## 2020-11-14 ENCOUNTER — Other Ambulatory Visit: Payer: Self-pay

## 2020-11-14 NOTE — ED Triage Notes (Signed)
C/o cont chronic constipation seen by Damien Fusi 7/4 for same CT abd ,labs done , Apt with PMD tomorrow

## 2020-11-15 ENCOUNTER — Encounter: Payer: Self-pay | Admitting: Family

## 2020-11-15 ENCOUNTER — Ambulatory Visit (INDEPENDENT_AMBULATORY_CARE_PROVIDER_SITE_OTHER): Payer: BC Managed Care – PPO | Admitting: Family

## 2020-11-15 ENCOUNTER — Other Ambulatory Visit: Payer: Self-pay | Admitting: Family

## 2020-11-15 ENCOUNTER — Other Ambulatory Visit: Payer: Self-pay

## 2020-11-15 VITALS — BP 124/80 | HR 111 | Temp 98.0°F | Ht 68.0 in | Wt 120.6 lb

## 2020-11-15 DIAGNOSIS — R1012 Left upper quadrant pain: Secondary | ICD-10-CM

## 2020-11-15 DIAGNOSIS — R109 Unspecified abdominal pain: Secondary | ICD-10-CM | POA: Diagnosis not present

## 2020-11-15 LAB — LIPASE: Lipase: 8 U/L — ABNORMAL LOW (ref 11.0–59.0)

## 2020-11-15 LAB — CBC WITH DIFFERENTIAL/PLATELET
Basophils Absolute: 0.1 10*3/uL (ref 0.0–0.1)
Basophils Relative: 0.7 % (ref 0.0–3.0)
Eosinophils Absolute: 0.1 10*3/uL (ref 0.0–0.7)
Eosinophils Relative: 1 % (ref 0.0–5.0)
HCT: 40.8 % (ref 39.0–52.0)
Hemoglobin: 13.9 g/dL (ref 13.0–17.0)
Lymphocytes Relative: 21.5 % (ref 12.0–46.0)
Lymphs Abs: 1.9 10*3/uL (ref 0.7–4.0)
MCHC: 34.1 g/dL (ref 30.0–36.0)
MCV: 88.5 fl (ref 78.0–100.0)
Monocytes Absolute: 0.9 10*3/uL (ref 0.1–1.0)
Monocytes Relative: 9.8 % (ref 3.0–12.0)
Neutro Abs: 6.1 10*3/uL (ref 1.4–7.7)
Neutrophils Relative %: 67 % (ref 43.0–77.0)
Platelets: 440 10*3/uL — ABNORMAL HIGH (ref 150.0–400.0)
RBC: 4.61 Mil/uL (ref 4.22–5.81)
RDW: 14.3 % (ref 11.5–15.5)
WBC: 9.1 10*3/uL (ref 4.0–10.5)

## 2020-11-15 LAB — AMYLASE: Amylase: 31 U/L (ref 27–131)

## 2020-11-15 NOTE — Progress Notes (Signed)
Gabriel Dalton is a 35 y.o. male with the following history as recorded in [employer]:  Patient Active Problem List   Diagnosis Date Noted   Intractable abdominal pain 04/25/2019   Hyperbilirubinemia 04/25/2019   Anemia 04/25/2019   Leukocytosis 04/25/2019   Gastric ulcers 04/25/2019   Ulcerated mucosa of rectum 04/25/2019   Melena 04/25/2019   Generalized abdominal pain 04/01/2019   Diarrhea 04/01/2019   Loss of weight 04/01/2019   Screening for viral disease 04/01/2019   Black stools 04/01/2019    Current Outpatient Medications  Medication Sig Dispense Refill   acetaminophen (TYLENOL) 325 MG tablet Take 650 mg by mouth every 6 (six) hours as needed for mild pain, fever or headache.     famotidine (PEPCID) 20 MG tablet Take 1 tablet (20 mg total) by mouth 2 (two) times daily. 30 tablet 0   metoCLOPramide (REGLAN) 10 MG tablet Take 1 tablet (10 mg total) by mouth every 6 (six) hours. 30 tablet 0   sucralfate (CARAFATE) 1 g tablet Take 1 tablet (1 g total) by mouth 4 (four) times daily -  with meals and at bedtime. 60 tablet 0   No current facility-administered medications for this visit.    Allergies: Patient has no known allergies.  Past Medical History:  Diagnosis Date   Bronchitis     Past Surgical History:  Procedure Laterality Date   COLONOSCOPY     I & D EXTREMITY  10/18/2011   Procedure: IRRIGATION AND DEBRIDEMENT EXTREMITY;  Surgeon: Dominica Severin, MD;  Location: [facility] OR;  Service: Orthopedics;  Laterality: Right;  Irrigation and Debridement  Right Middle Finger; Removal of foreign body   NO PAST SURGERIES      Family History  Problem Relation Age of Onset   Healthy Mother    Hypertension Father    Colon cancer Maternal Uncle    Rectal cancer Maternal Uncle    Brain cancer Maternal Uncle    Heart disease Maternal Aunt    Esophageal cancer Neg Hx    Stomach cancer Neg Hx     Social History   Tobacco Use   Smoking status: Every Day    Packs/day:  0.50    Pack years: 0.00    Types: Cigarettes   Smokeless tobacco: Never  Substance Use Topics   Alcohol use: Not Currently    Subjective:   Seen in the ER earlier this week with severe abdominal pain; has struggled with recurrent abdominal pain for the past 2 years; last saw GI in 2021- had normal endoscopy and colonoscopy; diagnosis of Crohn's Disease was discussed but biopsy from colonoscopy did not show disease; Is trying to eat healthy- very concerned about weight loss that has continued; is back on Reglan, Carafate and Pepcid;   Objective:  Vitals:   11/15/20 0838  BP: 124/80  Pulse: (!) 111  Temp: 98 F (36.7 C)  TempSrc: Oral  SpO2: 98%  Weight: 120 lb 9.6 oz (54.7 kg)  Height: 5\' 8"  (1.727 m)    General: Well developed, well nourished, in no acute distress  Skin : Warm and dry.  Head: Normocephalic and atraumatic  Lungs: Respirations unlabored; clear to auscultation bilaterally without wheeze, rales, rhonchi  CVS exam: normal rate and regular rhythm.  Abdomen: Soft; nontender; nondistended; normoactive bowel sounds; no masses or hepatosplenomegaly  Neurologic: Alert and oriented; speech intact; face symmetrical; moves all extremities well; CNII-XII intact without focal deficit   Assessment:  1. Recurrent abdominal pain  Plan:  Continue same medications; check CBC, amylase, lipase today; update abdominal MRI- recent CT done at ER was not remarkable; urgent referral back to GI;   This visit occurred during the SARS-CoV-2 public health emergency.  Safety protocols were in place, including screening questions prior to the visit, additional usage of staff PPE, and extensive cleaning of exam room while observing appropriate contact time as indicated for disinfecting solutions.    No follow-ups on file.  Orders Placed This Encounter  Procedures   CBC with Differential/Platelet   Amylase   Lipase   Ambulatory referral to Gastroenterology    Referral Priority:    Urgent    Referral Type:   Consultation    Referral Reason:   Specialty Services Required    Referred to Provider:   Sammuel Cooper, PA-C    Number of Visits Requested:   1   Ambulatory referral to Gastroenterology    Referral Priority:   Emergency    Referral Type:   Consultation    Referral Reason:   Specialty Services Required    Number of Visits Requested:   1    Requested Prescriptions    No prescriptions requested or ordered in this encounter

## 2020-11-16 ENCOUNTER — Other Ambulatory Visit: Payer: Self-pay | Admitting: Family

## 2020-11-24 ENCOUNTER — Ambulatory Visit
Admission: RE | Admit: 2020-11-24 | Discharge: 2020-11-24 | Disposition: A | Discharge status: Home / Self Care | Payer: BC Managed Care – PPO | Source: Ambulatory Visit | Attending: Family | Admitting: Family

## 2020-11-24 ENCOUNTER — Other Ambulatory Visit: Payer: Self-pay

## 2020-11-24 DIAGNOSIS — R1012 Left upper quadrant pain: Secondary | ICD-10-CM

## 2020-11-24 MED ORDER — GADOBENATE DIMEGLUMINE 529 MG/ML IV SOLN
11.0000 mL | Freq: Once | INTRAVENOUS | Status: AC | PRN
  Administered 2020-11-24: 11 mL via INTRAVENOUS

## 2020-11-27 ENCOUNTER — Encounter: Payer: Self-pay | Admitting: Family

## 2020-11-29 NOTE — Telephone Encounter (Addendum)
Are we able to write him back to work with restrictions? I have attempted to call pt to get more info no answer so I left a message to call back.

## 2020-12-26 ENCOUNTER — Ambulatory Visit: Payer: BC Managed Care – PPO | Admitting: Physician Assistant

## 2021-01-24 ENCOUNTER — Ambulatory Visit: Payer: BC Managed Care – PPO | Admitting: Gastroenterology

## 2021-02-11 ENCOUNTER — Encounter: Payer: Self-pay | Admitting: Family

## 2021-06-10 ENCOUNTER — Emergency Department (HOSPITAL_BASED_OUTPATIENT_CLINIC_OR_DEPARTMENT_OTHER)
Admission: EM | Admit: 2021-06-10 | Discharge: 2021-06-10 | Disposition: A | Discharge status: Home / Self Care | Payer: Self-pay | Attending: Emergency Medicine | Admitting: Emergency Medicine

## 2021-06-10 ENCOUNTER — Encounter (HOSPITAL_BASED_OUTPATIENT_CLINIC_OR_DEPARTMENT_OTHER): Payer: Self-pay

## 2021-06-10 ENCOUNTER — Emergency Department (HOSPITAL_BASED_OUTPATIENT_CLINIC_OR_DEPARTMENT_OTHER): Payer: Self-pay

## 2021-06-10 ENCOUNTER — Other Ambulatory Visit: Payer: Self-pay

## 2021-06-10 DIAGNOSIS — Z79899 Other long term (current) drug therapy: Secondary | ICD-10-CM | POA: Insufficient documentation

## 2021-06-10 DIAGNOSIS — N3000 Acute cystitis without hematuria: Secondary | ICD-10-CM

## 2021-06-10 DIAGNOSIS — R1030 Lower abdominal pain, unspecified: Secondary | ICD-10-CM | POA: Insufficient documentation

## 2021-06-10 LAB — CBC WITH DIFFERENTIAL/PLATELET
Abs Immature Granulocytes: 0.05 10*3/uL (ref 0.00–0.07)
Basophils Absolute: 0 10*3/uL (ref 0.0–0.1)
Basophils Relative: 0 %
Eosinophils Absolute: 0.2 10*3/uL (ref 0.0–0.5)
Eosinophils Relative: 2 %
HCT: 39 % (ref 39.0–52.0)
Hemoglobin: 13.2 g/dL (ref 13.0–17.0)
Immature Granulocytes: 0 %
Lymphocytes Relative: 19 %
Lymphs Abs: 2.5 10*3/uL (ref 0.7–4.0)
MCH: 29.1 pg (ref 26.0–34.0)
MCHC: 33.8 g/dL (ref 30.0–36.0)
MCV: 86.1 fL (ref 80.0–100.0)
Monocytes Absolute: 1.3 10*3/uL — ABNORMAL HIGH (ref 0.1–1.0)
Monocytes Relative: 10 %
Neutro Abs: 9 10*3/uL — ABNORMAL HIGH (ref 1.7–7.7)
Neutrophils Relative %: 69 %
Platelets: 435 10*3/uL — ABNORMAL HIGH (ref 150–400)
RBC: 4.53 MIL/uL (ref 4.22–5.81)
RDW: 14.7 % (ref 11.5–15.5)
WBC: 13.1 10*3/uL — ABNORMAL HIGH (ref 4.0–10.5)
nRBC: 0 % (ref 0.0–0.2)

## 2021-06-10 LAB — URINALYSIS, ROUTINE W REFLEX MICROSCOPIC
Bilirubin Urine: NEGATIVE
Glucose, UA: NEGATIVE mg/dL
Hgb urine dipstick: NEGATIVE
Nitrite: POSITIVE — AB
Protein, ur: 30 mg/dL — AB
Specific Gravity, Urine: >1.046 — ABNORMAL HIGH (ref 1.005–1.030)
pH: 7 (ref 5.0–8.0)

## 2021-06-10 LAB — COMPREHENSIVE METABOLIC PANEL
ALT: 9 U/L (ref 0–44)
AST: 11 U/L — ABNORMAL LOW (ref 15–41)
Albumin: 4.4 g/dL (ref 3.5–5.0)
Alkaline Phosphatase: 94 U/L (ref 38–126)
Anion gap: 9 (ref 5–15)
BUN: 14 mg/dL (ref 6–20)
CO2: 28 mmol/L (ref 22–32)
Calcium: 9.8 mg/dL (ref 8.9–10.3)
Chloride: 100 mmol/L (ref 98–111)
Creatinine, Ser: 1.03 mg/dL (ref 0.61–1.24)
GFR, Estimated: >60 mL/min (ref >60)
Glucose, Bld: 108 mg/dL — ABNORMAL HIGH (ref 70–99)
Potassium: 4.1 mmol/L (ref 3.5–5.1)
Sodium: 137 mmol/L (ref 135–145)
Total Bilirubin: 0.6 mg/dL (ref 0.3–1.2)
Total Protein: 8 g/dL (ref 6.5–8.1)

## 2021-06-10 MED ORDER — CEFTRIAXONE SODIUM 1 G IJ SOLR
1.0000 g | Freq: Once | INTRAMUSCULAR | Status: AC
  Administered 2021-06-10: 1 g via INTRAMUSCULAR
  Filled 2021-06-10: qty 10

## 2021-06-10 MED ORDER — DICYCLOMINE HCL 20 MG PO TABS: 20.0000 mg | ORAL_TABLET | Freq: Two times a day (BID) | ORAL | 0 refills | Status: DC

## 2021-06-10 MED ORDER — IOHEXOL 300 MG/ML  SOLN
100.0000 mL | Freq: Once | INTRAMUSCULAR | Status: AC | PRN
  Administered 2021-06-10: 100 mL via INTRAVENOUS

## 2021-06-10 MED ORDER — SODIUM CHLORIDE 0.9 % IV BOLUS
500.0000 mL | Freq: Once | INTRAVENOUS | Status: AC
  Administered 2021-06-10: 500 mL via INTRAVENOUS

## 2021-06-10 MED ORDER — CEPHALEXIN 500 MG PO CAPS: 500.0000 mg | ORAL_CAPSULE | Freq: Four times a day (QID) | ORAL | 0 refills | Status: DC

## 2021-06-10 MED ORDER — PHENAZOPYRIDINE HCL 200 MG PO TABS: 200.0000 mg | ORAL_TABLET | Freq: Three times a day (TID) | ORAL | 0 refills | Status: DC | PRN

## 2021-06-10 MED ORDER — DICYCLOMINE HCL 10 MG/ML IM SOLN
20.0000 mg | Freq: Once | INTRAMUSCULAR | Status: AC
  Administered 2021-06-10: 20 mg via INTRAMUSCULAR
  Filled 2021-06-10: qty 2

## 2021-06-10 MED ORDER — HALOPERIDOL LACTATE 5 MG/ML IJ SOLN
5.0000 mg | Freq: Once | INTRAMUSCULAR | Status: AC
  Administered 2021-06-10: 5 mg via INTRAVENOUS
  Filled 2021-06-10: qty 1

## 2021-06-10 MED ORDER — KETOROLAC TROMETHAMINE 30 MG/ML IJ SOLN
30.0000 mg | Freq: Once | INTRAMUSCULAR | Status: AC
  Administered 2021-06-10: 30 mg via INTRAVENOUS
  Filled 2021-06-10: qty 1

## 2021-06-10 NOTE — ED Provider Notes (Addendum)
[facility] EMERGENCY DEPT Provider Note   CSN: [id number] Arrival date & time: 06/10/21  0222     History  Chief Complaint  Patient presents with   Abdominal Pain    lower    Gabriel Dalton is a 36 y.o. male.  The history is provided by the patient.  Abdominal Pain Pain location:  Suprapubic Pain quality: aching and bloating   Pain radiates to:  Does not radiate Pain severity:  Severe Onset quality:  Gradual Duration:  4 days Timing:  Constant Chronicity:  Recurrent (has been seen by ED and GI for this and no cause has ever been found.) Context: not sick contacts and not suspicious food intake   Relieved by:  Nothing Worsened by:  Nothing Ineffective treatments:  None tried Associated symptoms: no chest pain, no constipation, no cough, no diarrhea, no fever, no nausea, no shortness of breath, no vaginal bleeding, no vaginal discharge and no vomiting   Risk factors: no alcohol abuse       Home Medications Prior to Admission medications   Medication Sig Start Date End Date Taking? Authorizing Provider  dicyclomine (BENTYL) 20 MG tablet Take 1 tablet (20 mg total) by mouth 2 (two) times daily. 06/10/21  Yes Isaack Preble, MD  omeprazole (PRILOSEC) 20 MG capsule Take 20 mg by mouth 2 (two) times daily before a meal.   Yes [provider]  acetaminophen (TYLENOL) 325 MG tablet Take 650 mg by mouth every 6 (six) hours as needed for mild pain, fever or headache.    [provider]  famotidine (PEPCID) 20 MG tablet Take 1 tablet (20 mg total) by mouth 2 (two) times daily. 11/11/20   Tedd Sias, PA  metoCLOPramide (REGLAN) 10 MG tablet Take 1 tablet (10 mg total) by mouth every 6 (six) hours. 11/11/20   Tedd Sias, PA  sucralfate (CARAFATE) 1 g tablet Take 1 tablet (1 g total) by mouth 4 (four) times daily -  with meals and at bedtime. 11/11/20   Tedd Sias, PA      Allergies    Aspirin    Review of Systems   Review  of Systems  Constitutional:  Negative for fever.  HENT:  Negative for facial swelling.   Eyes:  Negative for redness.  Respiratory:  Negative for cough and shortness of breath.   Cardiovascular:  Negative for chest pain.  Gastrointestinal:  Positive for abdominal pain. Negative for constipation, diarrhea, nausea and vomiting.  Genitourinary:  Negative for vaginal bleeding and vaginal discharge.  Musculoskeletal:  Negative for neck pain and neck stiffness.  Skin:  Negative for rash.  Neurological:  Negative for facial asymmetry.  All other systems reviewed and are negative.  Physical Exam Updated Vital Signs BP 124/85    Pulse 89    Temp 98.4 F (36.9 C) (Oral)    Resp (!) 24    Ht 5\' 8"  (1.727 m)    Wt 61.2 kg    SpO2 100%    BMI 20.53 kg/m  Physical Exam Vitals and nursing note reviewed. Exam conducted with a chaperone present.  Constitutional:      General: He is not in acute distress.    Appearance: Normal appearance.     Comments: Smelling strongly of marijuana  HENT:     Head: Normocephalic and atraumatic.     Nose: Nose normal.  Eyes:     Conjunctiva/sclera: Conjunctivae normal.     Pupils: Pupils are equal, round, and reactive  to light.  Cardiovascular:     Rate and Rhythm: Normal rate and regular rhythm.     Pulses: Normal pulses.     Heart sounds: Normal heart sounds.  Pulmonary:     Effort: Pulmonary effort is normal.     Breath sounds: Normal breath sounds.  Abdominal:     General: Abdomen is flat. Bowel sounds are normal.     Palpations: Abdomen is soft.     Tenderness: There is no abdominal tenderness. There is no guarding or rebound.     Hernia: No hernia is present.  Musculoskeletal:        General: Normal range of motion.     Cervical back: Normal range of motion and neck supple.  Skin:    General: Skin is warm and dry.     Capillary Refill: Capillary refill takes less than 2 seconds.  Neurological:     General: No focal deficit present.     Mental  Status: He is alert and oriented to person, place, and time.     Deep Tendon Reflexes: Reflexes normal.  Psychiatric:        Mood and Affect: Mood normal.        Behavior: Behavior normal.    ED Results / Procedures / Treatments   Labs (all labs ordered are listed, but only abnormal results are displayed) Results for orders placed or performed during the hospital encounter of 06/10/21  CBC with Differential/Platelet  Result Value Ref Range   WBC 13.1 (H) 4.0 - 10.5 K/uL   RBC 4.53 4.22 - 5.81 MIL/uL   Hemoglobin 13.2 13.0 - 17.0 g/dL   HCT 39.0 39.0 - 52.0 %   MCV 86.1 80.0 - 100.0 fL   MCH 29.1 26.0 - 34.0 pg   MCHC 33.8 30.0 - 36.0 g/dL   RDW 14.7 11.5 - 15.5 %   Platelets 435 (H) 150 - 400 K/uL   nRBC 0.0 0.0 - 0.2 %   Neutrophils Relative % 69 %   Neutro Abs 9.0 (H) 1.7 - 7.7 K/uL   Lymphocytes Relative 19 %   Lymphs Abs 2.5 0.7 - 4.0 K/uL   Monocytes Relative 10 %   Monocytes Absolute 1.3 (H) 0.1 - 1.0 K/uL   Eosinophils Relative 2 %   Eosinophils Absolute 0.2 0.0 - 0.5 K/uL   Basophils Relative 0 %   Basophils Absolute 0.0 0.0 - 0.1 K/uL   Immature Granulocytes 0 %   Abs Immature Granulocytes 0.05 0.00 - 0.07 K/uL  Comprehensive metabolic panel  Result Value Ref Range   Sodium 137 135 - 145 mmol/L   Potassium 4.1 3.5 - 5.1 mmol/L   Chloride 100 98 - 111 mmol/L   CO2 28 22 - 32 mmol/L   Glucose, Bld 108 (H) 70 - 99 mg/dL   BUN 14 6 - 20 mg/dL   Creatinine, Ser 1.03 0.61 - 1.24 mg/dL   Calcium 9.8 8.9 - 10.3 mg/dL   Total Protein 8.0 6.5 - 8.1 g/dL   Albumin 4.4 3.5 - 5.0 g/dL   AST 11 (L) 15 - 41 U/L   ALT 9 0 - 44 U/L   Alkaline Phosphatase 94 38 - 126 U/L   Total Bilirubin 0.6 0.3 - 1.2 mg/dL   GFR, Estimated >60 >60 mL/min   Anion gap 9 5 - 15   CT ABDOMEN PELVIS W CONTRAST  Result Date: 06/10/2021 CLINICAL DATA:  Abdominal pain EXAM: CT ABDOMEN AND PELVIS WITH CONTRAST TECHNIQUE: Multidetector CT  imaging of the abdomen and pelvis was performed using  the standard protocol following bolus administration of intravenous contrast. RADIATION DOSE REDUCTION: This exam was performed according to the departmental dose-optimization program which includes automated exposure control, adjustment of the mA and/or kV according to patient size and/or use of iterative reconstruction technique. CONTRAST:  159mL OMNIPAQUE IOHEXOL 300 MG/ML  SOLN COMPARISON:  MRI abdomen dated 11/24/2020. CT abdomen/pelvis dated 11/11/2020. FINDINGS: Lower chest: Lung bases are clear. Hepatobiliary: Liver is within normal limits. Gallbladder is unremarkable. No intrahepatic or extrahepatic ductal dilatation. Pancreas: Within normal limits. Spleen: Within normal limits. Adrenals/Urinary Tract: Adrenal glands are within normal limits. Small left renal cysts measuring up to 8 mm in the left upper pole (series 2/image 20). Right kidney is within normal limits. No hydronephrosis. Bladder is within normal limits. Stomach/Bowel: Stomach is within normal limits. No evidence of bowel obstruction. Suspected transient jejunal-intussusception in the left mid abdomen (series 2/image 51), of no clinical significance. Appendix is not discretely visualized. No colonic wall thickening or inflammatory changes. Vascular/Lymphatic: No evidence of abdominal aortic aneurysm. Atherosclerotic calcifications of the abdominal aorta and branch vessels. No suspicious abdominopelvic lymphadenopathy. Reproductive: Prostate is unremarkable. Other: No abdominopelvic ascites. Musculoskeletal: Visualized osseous structures are within normal limits. IMPRESSION: No evidence of bowel obstruction. No CT findings to account for the patient's abdominal pain. Electronically Signed   By: Julian Hy M.D.   On: 06/10/2021 03:24    EKG None  Radiology CT ABDOMEN PELVIS W CONTRAST  Result Date: 06/10/2021 CLINICAL DATA:  Abdominal pain EXAM: CT ABDOMEN AND PELVIS WITH CONTRAST TECHNIQUE: Multidetector CT imaging of the abdomen  and pelvis was performed using the standard protocol following bolus administration of intravenous contrast. RADIATION DOSE REDUCTION: This exam was performed according to the departmental dose-optimization program which includes automated exposure control, adjustment of the mA and/or kV according to patient size and/or use of iterative reconstruction technique. CONTRAST:  182mL OMNIPAQUE IOHEXOL 300 MG/ML  SOLN COMPARISON:  MRI abdomen dated 11/24/2020. CT abdomen/pelvis dated 11/11/2020. FINDINGS: Lower chest: Lung bases are clear. Hepatobiliary: Liver is within normal limits. Gallbladder is unremarkable. No intrahepatic or extrahepatic ductal dilatation. Pancreas: Within normal limits. Spleen: Within normal limits. Adrenals/Urinary Tract: Adrenal glands are within normal limits. Small left renal cysts measuring up to 8 mm in the left upper pole (series 2/image 20). Right kidney is within normal limits. No hydronephrosis. Bladder is within normal limits. Stomach/Bowel: Stomach is within normal limits. No evidence of bowel obstruction. Suspected transient jejunal-intussusception in the left mid abdomen (series 2/image 51), of no clinical significance. Appendix is not discretely visualized. No colonic wall thickening or inflammatory changes. Vascular/Lymphatic: No evidence of abdominal aortic aneurysm. Atherosclerotic calcifications of the abdominal aorta and branch vessels. No suspicious abdominopelvic lymphadenopathy. Reproductive: Prostate is unremarkable. Other: No abdominopelvic ascites. Musculoskeletal: Visualized osseous structures are within normal limits. IMPRESSION: No evidence of bowel obstruction. No CT findings to account for the patient's abdominal pain. Electronically Signed   By: Julian Hy M.D.   On: 06/10/2021 03:24    Procedures Procedures    Medications Ordered in ED Medications  sodium chloride 0.9 % bolus 500 mL (500 mLs Intravenous New Bag/Given 06/10/21 0256)  haloperidol  lactate (HALDOL) injection 5 mg (5 mg Intravenous Given 06/10/21 0333)  dicyclomine (BENTYL) injection 20 mg (20 mg Intramuscular Given 06/10/21 0333)  ketorolac (TORADOL) 30 MG/ML injection 30 mg (30 mg Intravenous Given 06/10/21 0333)  iohexol (OMNIPAQUE) 300 MG/ML solution 100 mL (100 mLs Intravenous Contrast Given  06/10/21 0301)    ED Course/ Medical Decision Making/ A&P                           Medical Decision Making Patient has been seen multiple times for this in the past and had an extensive work up including MRI of the abdomen without any cause.   Amount and/or Complexity of Data Reviewed Labs: ordered.    Details: platelets are elevated at 435 but this is chronic.  Normal potassium, BUN and creatinine, LFTs are also normal.  Urine is infected Radiology: ordered.    Details: CT without acute findings  Risk Prescription drug management. Risk Details: Considered diverticulitis and colitis, this was ruled out on CT scan.  Patient given medication and IV fluids for pain. Urine was finally given and returned positive for UTI, given location of symptoms this is the cause.  Well appearing and PO challenged successfully. Urine cultured.  Will treat for UTI.    Final Clinical Impression(s) / ED Diagnoses Final diagnoses:  Abdominal pain, unspecified abdominal location   Return for intractable cough, coughing up blood, fevers > 100.4 unrelieved by medication, shortness of breath, intractable vomiting, chest pain, shortness of breath, weakness, numbness, changes in speech, facial asymmetry, abdominal pain, passing out, Inability to tolerate liquids or food, cough, altered mental status or any concerns. No signs of systemic illness or infection. The patient is nontoxic-appearing on exam and vital signs are within normal limits.  I have reviewed the triage vital signs and the nursing notes. Pertinent labs & imaging results that were available during my care of the patient were reviewed by me  and considered in my medical decision making (see chart for details). After history, exam, and medical workup I feel the patient has been appropriately medically screened and is safe for discharge home. Pertinent diagnoses were discussed with the patient. Patient was given return precautions.      Rx / DC Orders     Stanislawa Gaffin, MD 06/10/21 [phone]

## 2021-06-10 NOTE — ED Notes (Signed)
Patient discharged to home.  All discharge instructions reviewed.  Patient verbalized understanding via teachback method.  VS WDL.  Respirations even and unlabored.  Ambulatory out of ED.   °

## 2021-06-10 NOTE — ED Notes (Signed)
Patient given a urinal and urine specimen requested.  

## 2021-06-10 NOTE — ED Triage Notes (Signed)
Patient complains of lower abdominal pain which began Friday night.  Denies nausea, vomiting or diarrhea.  He states he feels bloated and the pain is sharp in nature.  Pain is worse with bending and moving.

## 2021-06-12 LAB — URINE CULTURE
Culture: >=100000 — AB
Special Requests: NORMAL

## 2021-06-13 ENCOUNTER — Telehealth: Payer: Self-pay | Admitting: *Deleted

## 2021-06-13 NOTE — Telephone Encounter (Signed)
Post ED Visit - Positive Culture Follow-up  Culture report reviewed by antimicrobial stewardship pharmacist: [facility] []  , Pharm.D. []  Enzo Bi, Pharm.D., BCPS AQ-ID []  , Pharm.D., BCPS []  Celedonio Miyamoto, Pharm.D., BCPS []  Stre[place], Garvin Fila.D., BCPS, AAHIVP []  , Pharm.D., BCPS, AAHIVP []  Georgina Pillion, PharmD, BCPS []  , PharmD, BCPS []  [place], PharmD, BCPS []  [place], PharmD []  , PharmD, BCPS []  Estella Husk, PharmD  Pharmacy Team []  Lysle Pearl, PharmD []  , PharmD []  Phillips Climes, PharmD []  , Rph []  Agapito Games) , PharmD []  Verlan Friends, PharmD []  , PharmD []  Mervyn Gay, PharmD []  , PharmD []  Vinnie Level, PharmD []  [facility], PharmD []  , PharmD []  Len Childs, PharmD   Positive urine culture Treated with Cephalexin, organism sensitive to the same and no further patient follow-up is required at this time.  , PharmD  Greer Pickerel Talley 06/13/2021, 7:54 AM

## 2021-07-29 ENCOUNTER — Encounter: Payer: Self-pay | Admitting: Gastroenterology

## 2021-07-29 ENCOUNTER — Ambulatory Visit (INDEPENDENT_AMBULATORY_CARE_PROVIDER_SITE_OTHER): Payer: 59 | Admitting: Gastroenterology

## 2021-07-29 VITALS — BP 110/70 | HR 81 | Ht 68.0 in | Wt 139.0 lb

## 2021-07-29 DIAGNOSIS — R1010 Upper abdominal pain, unspecified: Secondary | ICD-10-CM | POA: Diagnosis not present

## 2021-07-29 DIAGNOSIS — K219 Gastro-esophageal reflux disease without esophagitis: Secondary | ICD-10-CM

## 2021-07-29 MED ORDER — SUCRALFATE 1 G PO TABS: 1.0000 g | ORAL_TABLET | Freq: Three times a day (TID) | ORAL | 3 refills | Status: DC

## 2021-07-29 MED ORDER — PANTOPRAZOLE SODIUM 40 MG PO TBEC: 40.0000 mg | DELAYED_RELEASE_TABLET | Freq: Every day | ORAL | 3 refills | Status: DC

## 2021-07-29 NOTE — Progress Notes (Signed)
? ?       ? ?Gabriel Dalton    [id number]    02/26/1986 ? ?Primary Care Physician:Murray, Allyne Gee, FNP ? ?Referring Physician: Olive Bass, FNP ?[address] ?[address] ?[place],  [place] [address] ? ? ?Chief complaint: Abdominal pain ? ?HPI: ? ?36 year old very pleasant male here for follow-up visit with complaints of upper abdominal pain.  He experiences burning sensation and upper abdominal discomfort, worse after meals.  He is only eating 2 meals a day because he feels full even after small meals. ?Denies any vomiting or dysphagia. ?He lost quite a bit of weight in the past 2 years but is no longer losing weight ?He has occasional constipation otherwise has a bowel movement daily.  Denies any rectal bleeding or dark stool ? ?He is smoking marijuana twice daily on average ? ?Multiple ER visits, most recent on June 10, 2021 for abdominal pain was treated with antibiotics for UTI and discharged home ? ?Urine tox screen was positive to opiates and THC in December 2020 ? ?CT abdomen and pelvis with contrast June 10, 2021: No acute abnormality to account for abdominal pain. ?Suspected transient jejunal intussusception in left mid abdomen, of no clinical significance per radiology read ? ? ?MRI abdomen with and without contrast November 24, 2020 ?Pancreatic divisum, an anatomic variant. ?No acute abnormality in the abdomen visualized. ? ?Biopsies were negative for H. pylori bacterial infection, dysplasia or metaplasia.  No evidence of inflammatory bowel disease ? ?EGD 04/20/2019 ?- Normal esophagus. ?- Small hiatal hernia. ?- Enlarged gastric folds with hemorrhage. Biopsied. ?- Non-bleeding gastric ulcers. ?- Gastritis with hemorrhage. Biopsied. ?- Normal examined duodenum. Biopsied. ? ?Colonoscopy 11/18/2018 ?- Friability in the terminal ileum. Biopsied. ?- Mucosal ulceration. Biopsied. ? ? ? ?Outpatient Encounter Medications as of 07/29/2021  ?Medication Sig  ? acetaminophen  (TYLENOL) 325 MG tablet Take 650 mg by mouth every 6 (six) hours as needed for mild pain, fever or headache.  ? cephALEXin (KEFLEX) 500 MG capsule Take 1 capsule (500 mg total) by mouth 4 (four) times daily.  ? famotidine (PEPCID) 20 MG tablet Take 1 tablet (20 mg total) by mouth 2 (two) times daily.  ? metoCLOPramide (REGLAN) 10 MG tablet Take 1 tablet (10 mg total) by mouth every 6 (six) hours.  ? omeprazole (PRILOSEC) 20 MG capsule Take 20 mg by mouth 2 (two) times daily before a meal.  ? phenazopyridine (PYRIDIUM) 200 MG tablet Take 1 tablet (200 mg total) by mouth 3 (three) times daily as needed for pain.  ? sucralfate (CARAFATE) 1 g tablet Take 1 tablet (1 g total) by mouth 4 (four) times daily -  with meals and at bedtime.  ? ?No facility-administered encounter medications on file as of 07/29/2021.  ? ? ?Allergies as of 07/29/2021 - Review Complete 06/10/2021  ?Allergen Reaction Noted  ? Aspirin  06/10/2021  ? ? ?Past Medical History:  ?Diagnosis Date  ? Bronchitis   ? ? ?Past Surgical History:  ?Procedure Laterality Date  ? COLONOSCOPY    ? I & D EXTREMITY  10/18/2011  ? Procedure: IRRIGATION AND DEBRIDEMENT EXTREMITY;  Surgeon: Dominica Severin, MD;  Location: [facility] OR;  Service: Orthopedics;  Laterality: Right;  Irrigation and Debridement  Right Middle Finger; Removal of foreign body  ? NO PAST SURGERIES    ? ? ?Family History  ?Problem Relation Age of Onset  ? Healthy Mother   ? Hypertension Father   ? Colon cancer Maternal Uncle   ?  Rectal cancer Maternal Uncle   ? Brain cancer Maternal Uncle   ? Heart disease Maternal Aunt   ? Esophageal cancer Neg Hx   ? Stomach cancer Neg Hx   ? ? ?Social History  ? ?Socioeconomic History  ? Marital status: Married  ?  Spouse name: Not on file  ? Number of children: Not on file  ? Years of education: Not on file  ? Highest education level: Not on file  ?Occupational History  ? Not on file  ?Tobacco Use  ? Smoking status: Every Day  ?  Packs/day: 0.50  ?  Types: Cigarettes   ? Smokeless tobacco: Never  ?Vaping Use  ? Vaping Use: Former  ?Substance and Sexual Activity  ? Alcohol use: Not Currently  ? Drug use: Not Currently  ? Sexual activity: Yes  ?  Birth control/protection: None  ?Other Topics Concern  ? Not on file  ?Social History Narrative  ? Not on file  ? ?Social Determinants of Health  ? ?Financial Resource Strain: Not on file  ?Food Insecurity: Not on file  ?Transportation Needs: Not on file  ?Physical Activity: Not on file  ?Stress: Not on file  ?Social Connections: Not on file  ?Intimate Partner Violence: Not on file  ? ? ? ? ?Review of systems: ?All other review of systems negative except as mentioned in the HPI. ? ? ?Physical Exam: ?Vitals:  ? 07/29/21 1020  ?BP: 110/70  ?Pulse: 81  ? ?Body mass index is 21.13 kg/m?. ?Gen:      No acute distress ?HEENT:  sclera anicteric ?Abd:      soft, non-tender; no palpable masses, no distension ?Ext:    No edema ?Neuro: alert and oriented x 3 ?Psych: normal mood and affect ? ?Data Reviewed: ? ?Reviewed labs, radiology imaging, old records and pertinent past GI work up ? ? ?Assessment and Plan/Recommendations: ? ?36 year old very pleasant male with complaints of upper abdominal discomfort, postprandial fullness and heartburn ? ?Reviewed prior imaging and GI work-up, negative for any acute GI pathology ?His symptoms are predominantly secondary to uncontrolled acid reflux, gastritis and potential side effects to chronic marijuana use ? ?Start pantoprazole 40 mg daily, 30 minutes before breakfast ?Use Carafate 1 g before meals and at bedtime as needed ?Small frequent meals ? ?Discussed with patient to limit use of marijuana or to completely abstain from it ?Continue to avoid NSAIDs ? ?Return in 2 to 3 months or sooner if needed ? ?This visit required 30 minutes of patient care (this includes precharting, chart review, review of results, face-to-face time used for counseling as well as treatment plan and follow-up. The patient was provided  an opportunity to ask questions and all were answered. The patient agreed with the plan and demonstrated an understanding of the instructions. ? ?K. Scherry Ran , MD ?  ? ?CC: Olive Bass,* ? ? ?

## 2021-07-29 NOTE — Patient Instructions (Addendum)
We have sent the following medications to your pharmacy for you to pick up at your convenience:  Pantoprazole  Carafate  ? ?Follow up in 3 months ? ?Take carafate 1 gm tablet (drink as slurry) before meals and at bedtime for 2-4  weeks then as needed ? ?If you are age 35 or older, your body mass index should be between 23-30. Your Body mass index is 21.13 kg/m?[name] If this is out of the aforementioned range listed, please consider follow up with your Primary Care Provider. ? ?If you are age 8 or younger, your body mass index should be between 19-25. Your Body mass index is 21.13 kg/m?[name] If this is out of the aformentioned range listed, please consider follow up with your Primary Care Provider.  ? ?________________________________________________________ ? ?The [facility] providers would like to encourage you to use [facility] to communicate with providers for non-urgent requests or questions.  Due to long hold times on the telephone, sending your provider a message by [facility] may be a faster and more efficient way to get a response.  Please allow 48 business hours for a response.  Please remember that this is for non-urgent requests.  ?_______________________________________________________  ? ?I appreciate the  opportunity to care for you ? ?Thank You  ? ?Harl Bowie , MD  ? ? ?

## 2021-09-21 ENCOUNTER — Other Ambulatory Visit: Payer: Self-pay | Admitting: Gastroenterology

## 2021-11-21 ENCOUNTER — Encounter (HOSPITAL_BASED_OUTPATIENT_CLINIC_OR_DEPARTMENT_OTHER): Payer: Self-pay | Admitting: Emergency Medicine

## 2021-11-21 ENCOUNTER — Other Ambulatory Visit: Payer: Self-pay

## 2021-11-21 DIAGNOSIS — F17210 Nicotine dependence, cigarettes, uncomplicated: Secondary | ICD-10-CM | POA: Insufficient documentation

## 2021-11-21 DIAGNOSIS — K5900 Constipation, unspecified: Secondary | ICD-10-CM | POA: Diagnosis not present

## 2021-11-21 DIAGNOSIS — R1013 Epigastric pain: Secondary | ICD-10-CM | POA: Diagnosis present

## 2021-11-21 NOTE — ED Triage Notes (Signed)
Patient reports constipation for the last 2 weeks, states he has been impacted, used enemas at home with no relief.  

## 2021-11-21 NOTE — ED Triage Notes (Signed)
Patient reports constipation for the last 2 weeks, states he has been impacted, used enemas at home with no relief.

## 2021-11-22 ENCOUNTER — Emergency Department (HOSPITAL_BASED_OUTPATIENT_CLINIC_OR_DEPARTMENT_OTHER)
Admission: EM | Admit: 2021-11-22 | Discharge: 2021-11-22 | Disposition: A | Discharge status: Home / Self Care | Payer: 59 | Attending: Emergency Medicine | Admitting: Emergency Medicine

## 2021-11-22 ENCOUNTER — Emergency Department (HOSPITAL_BASED_OUTPATIENT_CLINIC_OR_DEPARTMENT_OTHER): Payer: 59

## 2021-11-22 DIAGNOSIS — K5900 Constipation, unspecified: Secondary | ICD-10-CM

## 2021-11-22 DIAGNOSIS — R1013 Epigastric pain: Secondary | ICD-10-CM

## 2021-11-22 HISTORY — DX: Gastric ulcer, unspecified as acute or chronic, without hemorrhage or perforation: K25.9

## 2021-11-22 LAB — COMPREHENSIVE METABOLIC PANEL
ALT: 11 U/L (ref 0–44)
AST: 13 U/L — ABNORMAL LOW (ref 15–41)
Albumin: 4 g/dL (ref 3.5–5.0)
Alkaline Phosphatase: 76 U/L (ref 38–126)
Anion gap: 7 (ref 5–15)
BUN: 10 mg/dL (ref 6–20)
CO2: 28 mmol/L (ref 22–32)
Calcium: 9.4 mg/dL (ref 8.9–10.3)
Chloride: 101 mmol/L (ref 98–111)
Creatinine, Ser: 0.92 mg/dL (ref 0.61–1.24)
GFR, Estimated: >60 mL/min (ref >60)
Glucose, Bld: 79 mg/dL (ref 70–99)
Potassium: 4 mmol/L (ref 3.5–5.1)
Sodium: 136 mmol/L (ref 135–145)
Total Bilirubin: 0.6 mg/dL (ref 0.3–1.2)
Total Protein: 7.7 g/dL (ref 6.5–8.1)

## 2021-11-22 LAB — CBC WITH DIFFERENTIAL/PLATELET
Abs Immature Granulocytes: 0.02 10*3/uL (ref 0.00–0.07)
Basophils Absolute: 0.1 10*3/uL (ref 0.0–0.1)
Basophils Relative: 1 %
Eosinophils Absolute: 0.4 10*3/uL (ref 0.0–0.5)
Eosinophils Relative: 4 %
HCT: 38.4 % — ABNORMAL LOW (ref 39.0–52.0)
Hemoglobin: 13.4 g/dL (ref 13.0–17.0)
Immature Granulocytes: 0 %
Lymphocytes Relative: 42 %
Lymphs Abs: 4 10*3/uL (ref 0.7–4.0)
MCH: 30.5 pg (ref 26.0–34.0)
MCHC: 34.9 g/dL (ref 30.0–36.0)
MCV: 87.5 fL (ref 80.0–100.0)
Monocytes Absolute: 1 10*3/uL (ref 0.1–1.0)
Monocytes Relative: 11 %
Neutro Abs: 4 10*3/uL (ref 1.7–7.7)
Neutrophils Relative %: 42 %
Platelets: 530 10*3/uL — ABNORMAL HIGH (ref 150–400)
RBC: 4.39 MIL/uL (ref 4.22–5.81)
RDW: 14.5 % (ref 11.5–15.5)
WBC: 9.5 10*3/uL (ref 4.0–10.5)
nRBC: 0 % (ref 0.0–0.2)

## 2021-11-22 LAB — LIPASE, BLOOD: Lipase: 28 U/L (ref 11–51)

## 2021-11-22 MED ORDER — ONDANSETRON HCL 4 MG/2ML IJ SOLN
4.0000 mg | Freq: Once | INTRAMUSCULAR | Status: AC
  Administered 2021-11-22: 4 mg via INTRAVENOUS
  Filled 2021-11-22: qty 2

## 2021-11-22 MED ORDER — FENTANYL CITRATE PF 50 MCG/ML IJ SOSY
50.0000 ug | PREFILLED_SYRINGE | Freq: Once | INTRAMUSCULAR | Status: AC
  Administered 2021-11-22: 50 ug via INTRAVENOUS
  Filled 2021-11-22: qty 1

## 2021-11-22 MED ORDER — ESOMEPRAZOLE MAGNESIUM 40 MG PO CPDR: DELAYED_RELEASE_CAPSULE | ORAL | 0 refills | Status: DC

## 2021-11-22 MED ORDER — SUCRALFATE 1 G PO TABS: 1.0000 g | ORAL_TABLET | Freq: Three times a day (TID) | ORAL | 1 refills | Status: DC

## 2021-11-22 MED ORDER — IOHEXOL 300 MG/ML  SOLN
100.0000 mL | Freq: Once | INTRAMUSCULAR | Status: AC | PRN
  Administered 2021-11-22: 100 mL via INTRAVENOUS

## 2021-11-22 MED ORDER — PANTOPRAZOLE SODIUM 40 MG IV SOLR
40.0000 mg | Freq: Once | INTRAVENOUS | Status: AC
  Administered 2021-11-22: 40 mg via INTRAVENOUS
  Filled 2021-11-22: qty 10

## 2021-11-22 NOTE — ED Provider Notes (Signed)
[facility] Provider Note: Lowella Dell, MD, FACEP  CSN: [id number] MRN: [id number] ARRIVAL: 11/21/21 at 2158 ROOM: MH01/MH01   CHIEF COMPLAINT  Abdominal Pain   HISTORY OF PRESENT ILLNESS  11/22/21 1:31 AM Gabriel Dalton is a 36 y.o. male with 2 weeks of constipation.  He believes he is impacted.  He has been using suppositories and enemas at home with no relief.  He is having sharp stabbing pain in his abdomen which he rates as a 10 out of 10.  He has a history of gastric ulcers and states that whenever he eats or drinks anything it causes severe epigastric burning.  He has not been vomiting.   Past Medical History:  Diagnosis Date   Bronchitis    Multiple gastric ulcers     Past Surgical History:  Procedure Laterality Date   COLONOSCOPY     I & D EXTREMITY  10/18/2011   Procedure: IRRIGATION AND DEBRIDEMENT EXTREMITY;  Surgeon: Dominica Severin, MD;  Location: [facility];  Service: Orthopedics;  Laterality: Right;  Irrigation and Debridement  Right Middle Finger; Removal of foreign body    Family History  Problem Relation Age of Onset   Healthy Mother    Hypertension Father    Colon cancer Maternal Uncle    Rectal cancer Maternal Uncle    Brain cancer Maternal Uncle    Heart disease Maternal Aunt    Esophageal cancer Neg Hx    Stomach cancer Neg Hx     Social History   Tobacco Use   Smoking status: Every Day    Packs/day: 0.50    Types: Cigarettes   Smokeless tobacco: Never  Vaping Use   Vaping Use: Former  Substance Use Topics   Alcohol use: Not Currently   Drug use: Not Currently    Prior to Admission medications   Medication Sig Start Date End Date Taking? Authorizing Provider  esomeprazole (NEXIUM) 40 MG capsule Take 1 capsule daily at least 30 minutes before first dose of Carafate. 11/22/21  Yes Kynnedy Carreno, MD  sucralfate (CARAFATE) 1 g tablet Take 1 tablet (1 g total) by mouth 4 (four) times daily -  with meals and at bedtime.  11/22/21  Yes Lekeisha Arenas, MD  acetaminophen (TYLENOL) 325 MG tablet Take 650 mg by mouth every 6 (six) hours as needed for mild pain, fever or headache.    [provider]    Allergies Aspirin   REVIEW OF SYSTEMS  Negative except as noted here or in the History of Present Illness.   PHYSICAL EXAMINATION  Initial Vital Signs Blood pressure (!) 124/96, pulse 87, temperature 98.5 F (36.9 C), temperature source Oral, resp. rate 16, height 5\' 8"  (1.727 m), weight 61.2 kg, SpO2 100 %.  Examination General: Well-developed, thin male in no acute distress; appearance consistent with age of record HENT: normocephalic; atraumatic Eyes: Normal appearance Neck: supple Heart: regular rate and rhythm Lungs: clear to auscultation bilaterally Abdomen: soft; nondistended; mild diffuse tenderness; no masses or hepatosplenomegaly; bowel sounds present Rectal: Normal sphincter tone; no formed stool in vault; no impaction Extremities: No deformity; full range of motion; pulses normal Neurologic: Awake, alert and oriented; motor function intact in all extremities and symmetric; no facial droop Skin: Warm and dry Psychiatric: Normal mood and affect   RESULTS  Summary of this visit's results, reviewed and interpreted by myself:   EKG Interpretation  Date/Time:    Ventricular Rate:    PR Interval:    QRS Duration:  QT Interval:    QTC Calculation:   R Axis:     Text Interpretation:         Laboratory Studies: Results for orders placed or performed during the hospital encounter of 11/22/21 (from the past 24 hour(s))  CBC with Differential     Status: Abnormal   Collection Time: 11/22/21  1:42 AM  Result Value Ref Range   WBC 9.5 4.0 - 10.5 K/uL   RBC 4.39 4.22 - 5.81 MIL/uL   Hemoglobin 13.4 13.0 - 17.0 g/dL   HCT 16.1 (L) 09.6 - 04.5 %   MCV 87.5 80.0 - 100.0 fL   MCH 30.5 26.0 - 34.0 pg   MCHC 34.9 30.0 - 36.0 g/dL   RDW 40.9 81.1 - 91.4 %   Platelets 530 (H) 150 -  400 K/uL   nRBC 0.0 0.0 - 0.2 %   Neutrophils Relative % 42 %   Neutro Abs 4.0 1.7 - 7.7 K/uL   Lymphocytes Relative 42 %   Lymphs Abs 4.0 0.7 - 4.0 K/uL   Monocytes Relative 11 %   Monocytes Absolute 1.0 0.1 - 1.0 K/uL   Eosinophils Relative 4 %   Eosinophils Absolute 0.4 0.0 - 0.5 K/uL   Basophils Relative 1 %   Basophils Absolute 0.1 0.0 - 0.1 K/uL   Immature Granulocytes 0 %   Abs Immature Granulocytes 0.02 0.00 - 0.07 K/uL  Comprehensive metabolic panel     Status: Abnormal   Collection Time: 11/22/21  1:42 AM  Result Value Ref Range   Sodium 136 135 - 145 mmol/L   Potassium 4.0 3.5 - 5.1 mmol/L   Chloride 101 98 - 111 mmol/L   CO2 28 22 - 32 mmol/L   Glucose, Bld 79 70 - 99 mg/dL   BUN 10 6 - 20 mg/dL   Creatinine, Ser 7.82 0.61 - 1.24 mg/dL   Calcium 9.4 8.9 - 95.6 mg/dL   Total Protein 7.7 6.5 - 8.1 g/dL   Albumin 4.0 3.5 - 5.0 g/dL   AST 13 (L) 15 - 41 U/L   ALT 11 0 - 44 U/L   Alkaline Phosphatase 76 38 - 126 U/L   Total Bilirubin 0.6 0.3 - 1.2 mg/dL   GFR, Estimated >21 >30 mL/min   Anion gap 7 5 - 15  Lipase, blood     Status: None   Collection Time: 11/22/21  1:42 AM  Result Value Ref Range   Lipase 28 11 - 51 U/L   Imaging Studies: CT ABDOMEN PELVIS W CONTRAST  Result Date: 11/22/2021 CLINICAL DATA:  Abdominal pain. EXAM: CT ABDOMEN AND PELVIS WITH CONTRAST TECHNIQUE: Multidetector CT imaging of the abdomen and pelvis was performed using the standard protocol following bolus administration of intravenous contrast. RADIATION DOSE REDUCTION: This exam was performed according to the departmental dose-optimization program which includes automated exposure control, adjustment of the mA and/or kV according to patient size and/or use of iterative reconstruction technique. CONTRAST:  OMNIPAQUE IOHEXOL 300 MG/ML  SOLN COMPARISON:  CT abdomen pelvis dated 11/08/2021. FINDINGS: Lower chest: The visualized lung bases are clear. No intra-abdominal free air or free  fluid. Hepatobiliary: Subcentimeter right hepatic hypodense focus is too small to characterize. The liver is otherwise unremarkable. No biliary ductal dilatation. The gallbladder is unremarkable. Pancreas: Unremarkable. No pancreatic ductal dilatation or surrounding inflammatory changes. Spleen: Normal in size without focal abnormality. Adrenals/Urinary Tract: Adrenal glands are unremarkable. Kidneys are normal, without renal calculi, focal lesion, or hydronephrosis. Bladder is  unremarkable. Stomach/Bowel: Evaluation of the bladder is limited in the absence of oral contrast and due to paucity of intra-abdominal fat. There is no bowel obstruction for hip inflammation. The appendix is not visualized with certainty. No inflammatory changes identified in the right lower quadrant. Vascular/Lymphatic: Mild aortoiliac atherosclerotic disease. The IVC is unremarkable. No portal venous gas. There is no adenopathy. Reproductive: The prostate and seminal vesicles are grossly unremarkable. No pelvic gas-filled Other: None Musculoskeletal: No acute or significant osseous findings. IMPRESSION: 1. No acute intra-abdominal or pelvic pathology. 2. Aortic Atherosclerosis (ICD10-I70.0). Electronically Signed   By: Elgie Collard M.D.   On: 11/22/2021 03:11    ED COURSE and MDM  Nursing notes, initial and subsequent vitals signs, including pulse oximetry, reviewed and interpreted by myself.  Vitals:   11/21/21 2216 11/22/21 0145 11/22/21 0200 11/22/21 0215  BP:  117/74 119/84 114/78  Pulse:  75 60 (!) 53  Resp:  18 18 16   Temp:      TempSrc:      SpO2:  99% 98% 98%  Weight: 61.2 kg     Height: 5\' 8"  (1.727 m)      Medications  ondansetron (ZOFRAN) injection 4 mg (4 mg Intravenous Given 11/22/21 0147)  fentaNYL (SUBLIMAZE) injection 50 mcg (50 mcg Intravenous Given 11/22/21 0147)  pantoprazole (PROTONIX) injection 40 mg (40 mg Intravenous Given 11/22/21 0147)  iohexol (OMNIPAQUE) 300 MG/ML solution 100 mL (100 mLs  Intravenous Contrast Given 11/22/21 0225)   CT is negative for any acute abnormality.  The patient's constipation may benefit from a laxative.  There is no stool palpable in the rectum to suggest an enema is the best treatment at this time.  He was advised to try MiraLAX.  We will also start him on a PPI for his gastric symptoms.   PROCEDURES  Procedures   ED DIAGNOSES     ICD-10-CM   1. Constipation, unspecified constipation type  K59.00     2. Epigastric pain  R10.13          Alexiss Iturralde, 11/24/21, MD 11/22/21 [phone]

## 2021-11-25 ENCOUNTER — Telehealth: Payer: Self-pay | Admitting: Gastroenterology

## 2021-11-25 ENCOUNTER — Telehealth: Payer: Self-pay | Admitting: *Deleted

## 2021-11-25 NOTE — Telephone Encounter (Signed)
Left message on machine to call back  

## 2021-11-25 NOTE — Telephone Encounter (Signed)
Patient returned your call, please advise. 

## 2021-11-25 NOTE — Telephone Encounter (Signed)
Patient advised to call his GI back now.  They tried to call him earlier today.

## 2021-11-25 NOTE — Telephone Encounter (Signed)
Patient called states he was at the ED for abdominal issues and they told him he does not have any blockage or anything. The patient said he has not be able to go to the bathroom for weeks now. Seeking advise.

## 2021-11-25 NOTE — Telephone Encounter (Signed)
Does he need an appt or triage?  ----- Message -----  From: Wynelle Fanny Dorrell  Sent: 11/25/2021   3:49 PM EDT  To: [facility]  Subject: Appointment Request                              Appointment Request From: Lujean Rave    With Provider: Olive Bass, FNP [[facility]]    Preferred Date Range: Any    Preferred Times: Any Time    Reason for visit: Office Visit    Comments:  Great afternoon Vernona Rieger. Hope all is well. Hey I recently went to the we for my stomach and bowl movement. I have not been able to use the restroom in 2 weeks now and they claim it's no blockage. I am in dying need of another colonoscopy to see what's going on or if something has changed. If I'm unable to get one from you guys can you please transfer my information to another GI? I can't continue to go through this pain. If you please could respond at your earliest convenience. Thanks.

## 2021-11-28 ENCOUNTER — Other Ambulatory Visit: Payer: Self-pay

## 2021-11-28 ENCOUNTER — Emergency Department (HOSPITAL_BASED_OUTPATIENT_CLINIC_OR_DEPARTMENT_OTHER)
Admission: EM | Admit: 2021-11-28 | Discharge: 2021-11-28 | Disposition: A | Discharge status: Home / Self Care | Payer: 59 | Attending: Emergency Medicine | Admitting: Emergency Medicine

## 2021-11-28 ENCOUNTER — Encounter (HOSPITAL_BASED_OUTPATIENT_CLINIC_OR_DEPARTMENT_OTHER): Payer: Self-pay | Admitting: Emergency Medicine

## 2021-11-28 DIAGNOSIS — N410 Acute prostatitis: Secondary | ICD-10-CM | POA: Insufficient documentation

## 2021-11-28 DIAGNOSIS — D72829 Elevated white blood cell count, unspecified: Secondary | ICD-10-CM | POA: Insufficient documentation

## 2021-11-28 DIAGNOSIS — K5900 Constipation, unspecified: Secondary | ICD-10-CM | POA: Diagnosis not present

## 2021-11-28 DIAGNOSIS — K6289 Other specified diseases of anus and rectum: Secondary | ICD-10-CM | POA: Diagnosis present

## 2021-11-28 LAB — CBC WITH DIFFERENTIAL/PLATELET
Abs Immature Granulocytes: 0.03 10*3/uL (ref 0.00–0.07)
Basophils Absolute: 0 10*3/uL (ref 0.0–0.1)
Basophils Relative: 0 %
Eosinophils Absolute: 0 10*3/uL (ref 0.0–0.5)
Eosinophils Relative: 0 %
HCT: 40.6 % (ref 39.0–52.0)
Hemoglobin: 14.2 g/dL (ref 13.0–17.0)
Immature Granulocytes: 0 %
Lymphocytes Relative: 13 %
Lymphs Abs: 1.9 10*3/uL (ref 0.7–4.0)
MCH: 30.3 pg (ref 26.0–34.0)
MCHC: 35 g/dL (ref 30.0–36.0)
MCV: 86.6 fL (ref 80.0–100.0)
Monocytes Absolute: 0.6 10*3/uL (ref 0.1–1.0)
Monocytes Relative: 5 %
Neutro Abs: 11.5 10*3/uL — ABNORMAL HIGH (ref 1.7–7.7)
Neutrophils Relative %: 82 %
Platelets: 474 10*3/uL — ABNORMAL HIGH (ref 150–400)
RBC: 4.69 MIL/uL (ref 4.22–5.81)
RDW: 14.6 % (ref 11.5–15.5)
WBC: 14.1 10*3/uL — ABNORMAL HIGH (ref 4.0–10.5)
nRBC: 0 % (ref 0.0–0.2)

## 2021-11-28 LAB — COMPREHENSIVE METABOLIC PANEL
ALT: 11 U/L (ref 0–44)
AST: 18 U/L (ref 15–41)
Albumin: 4.4 g/dL (ref 3.5–5.0)
Alkaline Phosphatase: 86 U/L (ref 38–126)
Anion gap: 9 (ref 5–15)
BUN: 7 mg/dL (ref 6–20)
CO2: 26 mmol/L (ref 22–32)
Calcium: 9.6 mg/dL (ref 8.9–10.3)
Chloride: 102 mmol/L (ref 98–111)
Creatinine, Ser: 0.94 mg/dL (ref 0.61–1.24)
GFR, Estimated: >60 mL/min (ref >60)
Glucose, Bld: 100 mg/dL — ABNORMAL HIGH (ref 70–99)
Potassium: 4.4 mmol/L (ref 3.5–5.1)
Sodium: 137 mmol/L (ref 135–145)
Total Bilirubin: 1 mg/dL (ref 0.3–1.2)
Total Protein: 8.2 g/dL — ABNORMAL HIGH (ref 6.5–8.1)

## 2021-11-28 LAB — URINALYSIS, ROUTINE W REFLEX MICROSCOPIC
Bilirubin Urine: NEGATIVE
Glucose, UA: NEGATIVE mg/dL
Hgb urine dipstick: NEGATIVE
Ketones, ur: NEGATIVE mg/dL
Nitrite: POSITIVE — AB
Protein, ur: NEGATIVE mg/dL
Specific Gravity, Urine: 1.02 (ref 1.005–1.030)
pH: 6 (ref 5.0–8.0)

## 2021-11-28 LAB — URINALYSIS, MICROSCOPIC (REFLEX): RBC / HPF: NONE SEEN RBC/hpf (ref 0–5)

## 2021-11-28 LAB — LIPASE, BLOOD: Lipase: 24 U/L (ref 11–51)

## 2021-11-28 MED ORDER — DOXYCYCLINE HYCLATE 100 MG PO CAPS: 100.0000 mg | ORAL_CAPSULE | Freq: Two times a day (BID) | ORAL | 0 refills | Status: AC

## 2021-11-28 MED ORDER — SODIUM CHLORIDE 0.9 % IV SOLN
1.0000 g | Freq: Once | INTRAVENOUS | Status: AC
  Administered 2021-11-28: 1 g via INTRAVENOUS
  Filled 2021-11-28: qty 10

## 2021-11-28 MED ORDER — ONDANSETRON HCL 4 MG/2ML IJ SOLN
4.0000 mg | Freq: Once | INTRAMUSCULAR | Status: AC
  Administered 2021-11-28: 4 mg via INTRAVENOUS
  Filled 2021-11-28: qty 2

## 2021-11-28 MED ORDER — MORPHINE SULFATE (PF) 4 MG/ML IV SOLN
4.0000 mg | Freq: Once | INTRAVENOUS | Status: AC
  Administered 2021-11-28: 4 mg via INTRAVENOUS
  Filled 2021-11-28: qty 1

## 2021-11-28 MED ORDER — HYDROCODONE-ACETAMINOPHEN 5-325 MG PO TABS: 1.0000 | ORAL_TABLET | ORAL | 0 refills | Status: DC | PRN

## 2021-11-28 MED ORDER — SODIUM CHLORIDE 0.9 % IV BOLUS
1000.0000 mL | Freq: Once | INTRAVENOUS | Status: AC
  Administered 2021-11-28: 1000 mL via INTRAVENOUS

## 2021-11-28 NOTE — ED Provider Notes (Signed)
[facility] EMERGENCY DEPARTMENT Provider Note   CSN: [id number] Arrival date & time: 11/28/21  1606     History  Chief Complaint  Patient presents with   Abdominal Pain    Gabriel Dalton is a 36 y.o. male.  36 year old male presents with complaint of constipation x 2 weeks, now with severe rectal pain, worse with sitting. States he has tried mirlax, Dulcolax, suppositories, enemas.  States the only output he has had has been liquid from the enema, states his last bowel movement was 2 weeks ago and was noted to be bloody.  Denies fevers, chills, nausea, vomiting.  Patient was seen for same on 714, had a CT at that time that was unremarkable.  Has been unable to get in with his GI since.       Home Medications Prior to Admission medications   Medication Sig Start Date End Date Taking? Authorizing Provider  doxycycline (VIBRAMYCIN) 100 MG capsule Take 1 capsule (100 mg total) by mouth 2 (two) times daily for 14 days. 11/28/21 12/12/21 Yes Jeannie Fend, PA-C  acetaminophen (TYLENOL) 325 MG tablet Take 650 mg by mouth every 6 (six) hours as needed for mild pain, fever or headache.    [provider]  esomeprazole (NEXIUM) 40 MG capsule Take 1 capsule daily at least 30 minutes before first dose of Carafate. 11/22/21   Molpus, John, MD  sucralfate (CARAFATE) 1 g tablet Take 1 tablet (1 g total) by mouth 4 (four) times daily -  with meals and at bedtime. 11/22/21   Molpus, John, MD      Allergies    Aspirin    Review of Systems   Review of Systems Negative except as per HPI Physical Exam Updated Vital Signs BP 123/84 (BP Location: Left Arm)   Pulse 94   Temp 98.7 F (37.1 C) (Oral)   Resp 18   Ht 5\' 8"  (1.727 m)   Wt 61.2 kg   SpO2 100%   BMI 20.53 kg/m  Physical Exam Vitals and nursing note reviewed.  Constitutional:      General: He is not in acute distress.    Appearance: He is well-developed. He is not diaphoretic.  HENT:     Head:  Normocephalic and atraumatic.  Cardiovascular:     Rate and Rhythm: Normal rate and regular rhythm.     Heart sounds: Normal heart sounds.  Pulmonary:     Effort: Pulmonary effort is normal.     Breath sounds: Normal breath sounds.  Abdominal:     General: Bowel sounds are normal.     Palpations: Abdomen is soft.     Tenderness: There is generalized abdominal tenderness. There is no right CVA tenderness or left CVA tenderness.  Skin:    General: Skin is warm and dry.     Findings: No erythema or rash.  Neurological:     Mental Status: He is alert and oriented to person, place, and time.  Psychiatric:        Behavior: Behavior normal.     ED Results / Procedures / Treatments   Labs (all labs ordered are listed, but only abnormal results are displayed) Labs Reviewed  CBC WITH DIFFERENTIAL/PLATELET - Abnormal; Notable for the following components:      Result Value   WBC 14.1 (*)    Platelets 474 (*)    Neutro Abs 11.5 (*)    All other components within normal limits  COMPREHENSIVE METABOLIC PANEL - Abnormal;  Notable for the following components:   Glucose, Bld 100 (*)    Total Protein 8.2 (*)    All other components within normal limits  URINALYSIS, ROUTINE W REFLEX MICROSCOPIC - Abnormal; Notable for the following components:   APPearance CLOUDY (*)    Nitrite POSITIVE (*)    Leukocytes,Ua SMALL (*)    All other components within normal limits  URINALYSIS, MICROSCOPIC (REFLEX) - Abnormal; Notable for the following components:   Bacteria, UA MANY (*)    All other components within normal limits  URINE CULTURE  LIPASE, BLOOD  GC/CHLAMYDIA PROBE AMP ([place]) NOT AT [facility]    EKG None  Radiology No results found.  Procedures Procedures    Medications Ordered in ED Medications  cefTRIAXone (ROCEPHIN) 1 g in sodium chloride 0.9 % 100 mL IVPB (1 g Intravenous New Bag/Given 11/28/21 1804)  sodium chloride 0.9 % bolus 1,000 mL (0 mLs Intravenous Stopped 11/28/21  1756)  ondansetron (ZOFRAN) injection 4 mg (4 mg Intravenous Given 11/28/21 1654)  morphine (PF) 4 MG/ML injection 4 mg (4 mg Intravenous Given 11/28/21 1654)    ED Course/ Medical Decision Making/ A&P                           Medical Decision Making Amount and/or Complexity of Data Reviewed Labs: ordered.  Risk Prescription drug management.   This patient presents to the ED for concern of abdominal and rectal pain, this involves an extensive number of treatment options, and is a complaint that carries with it a high risk of complications and morbidity.  The differential diagnosis includes bowel obstruction, constipation, prostatitis, cystitis, hemorrhoid   Co morbidities that complicate the patient evaluation  Peptic ulcer disease   Additional history obtained:  External records from outside source obtained and reviewed including prior ED record from 11/22/2021 including personally reviewing the CT images which do not show significant stool burden Colonoscopy from 04/20/2019, biopsies negative for inflammatory bowel disease, Crohn's disease or microscopic colitis. Urine culture from June 10, 2021, positive for greater than 100,000 colonies E. coli.  With resistance to ampicillin, gentamicin, Bactrim.   Lab Tests:  I Ordered, and personally interpreted labs.  The pertinent results include: CBC with elevated white count 14.1, increased from prior in the emergency room.   Problem List / ED Course / Critical interventions / Medication management  36 year old male with complaint of lower abdominal pain with pain in his rectum, worse with sitting.  Patient was seen in this emergency room earlier this week, had a CT scan abdomen pelvis which was unremarkable.  His white count is elevated today compared to prior.  Urinalysis was not obtained at prior visit, did have a UTI earlier this year.  He is nitrite and leukocyte positive with many bacteria.  Due to his concern for lower  abdominal pain and specifically rectal pain worse with sitting, concern for proctitis.  Patient is given Rocephin in the ER and put on 2-week course of doxycycline.  Recommend that he follow-up with his primary care provider for recheck and abstain from intercourse until all of his test results are available. I ordered medication including Rocephin, morphine, Zofran for pain, UTI/proctitis Reevaluation of the patient after these medicines showed that the patient improved I have reviewed the patients home medicines and have made adjustments as needed   Social Determinants of Health:  Has PCP and GI providers   Test / Admission - Considered:  Consider repeat  CT scan however feel that his urinalysis explains his symptoms today and will defer further radiation at this time.         Final Clinical Impression(s) / ED Diagnoses Final diagnoses:  Acute prostatitis    Rx / DC Orders ED Discharge Orders          Ordered    doxycycline (VIBRAMYCIN) 100 MG capsule  2 times daily        11/28/21 1746              Jeannie Fend, PA-C 11/28/21 1810    Virgina Norfolk, DO 11/28/21 2049

## 2021-11-28 NOTE — ED Triage Notes (Signed)
Pt c/o lower abdominal pain that radiates to his rectum since 8 am this morning. Endorses constipation x 2 weeks. Seen for same on 7/14. Pain has not improved.

## 2021-11-28 NOTE — Discharge Instructions (Addendum)
Take antibiotics as prescribed and complete the full course. Abstain from sex until all of your test results are available and antibiotics completed.  Recheck with your doctor.

## 2021-11-28 NOTE — ED Notes (Signed)
RN provided AVS using Teachback Method. Patient verbalizes understanding of Discharge Instructions. Opportunity for Questioning and Answers were provided by RN. Patient Discharged from ED in Wheelchair to Home with Family.  

## 2021-11-29 LAB — GC/CHLAMYDIA PROBE AMP (~~LOC~~) NOT AT ARMC
Chlamydia: NEGATIVE
Comment: NEGATIVE
Comment: NORMAL
Neisseria Gonorrhea: NEGATIVE

## 2021-11-29 NOTE — Telephone Encounter (Signed)
Left message to call back. Needs an appointment in the office for evaluation)

## 2021-11-30 LAB — URINE CULTURE: Culture: >=100000 — AB

## 2021-12-01 ENCOUNTER — Telehealth (HOSPITAL_BASED_OUTPATIENT_CLINIC_OR_DEPARTMENT_OTHER): Payer: Self-pay | Admitting: *Deleted

## 2021-12-01 NOTE — Telephone Encounter (Signed)
Post ED Visit - Positive Culture Follow-up  Culture report reviewed by antimicrobial stewardship pharmacist: [facility] []  , Pharm.D. []  Enzo Bi, Pharm.D., BCPS AQ-ID []  , Pharm.D., BCPS []  Celedonio Miyamoto, Pharm.D., BCPS []  Golva, Garvin Fila.D., BCPS, AAHIVP []  , Pharm.D., BCPS, AAHIVP []  Georgina Pillion, PharmD, BCPS []  , PharmD, BCPS []  [place], PharmD, BCPS []  [address], PharmD []  , PharmD, BCPS [x]  Estella Husk, PharmD  Pharmacy Team []  Lysle Pearl, PharmD []  , PharmD []  Phillips Climes, PharmD []  , Rph []  Agapito Games) , PharmD []  Verlan Friends, PharmD []  , PharmD []  Mervyn Gay, PharmD []  , PharmD []  Daylene Posey, PharmD []  [facility], PharmD []  , PharmD []  Len Childs, PharmD   Positive urine culture Treated with Doxycycline , organism sensitive to the same and no further patient follow-up is required at this time.  12/01/2021, 10:35 AM

## 2021-12-02 NOTE — Telephone Encounter (Signed)
Ok to schedule with APP, thanks 

## 2021-12-02 NOTE — Telephone Encounter (Signed)
Patient agrees to this plan. Appointment scheduled.

## 2021-12-02 NOTE — Telephone Encounter (Signed)
Spoke with the patient. Reports he is continuing to have abdominal pain. Recent ER visit. He does not feel the medications he was given at the ER visit help. He feels they are also constipating him. He asks if he can get a "colonoscopy or something because I'm not getting better."  You do not have any openings on your schedule. Is it okay to schedule him with an APP?

## 2021-12-11 ENCOUNTER — Encounter: Payer: Self-pay | Admitting: Gastroenterology

## 2021-12-11 ENCOUNTER — Ambulatory Visit (INDEPENDENT_AMBULATORY_CARE_PROVIDER_SITE_OTHER): Payer: 59 | Admitting: Gastroenterology

## 2021-12-11 VITALS — BP 138/72 | HR 78 | Ht 68.0 in | Wt 130.0 lb

## 2021-12-11 DIAGNOSIS — K259 Gastric ulcer, unspecified as acute or chronic, without hemorrhage or perforation: Secondary | ICD-10-CM

## 2021-12-11 DIAGNOSIS — R1084 Generalized abdominal pain: Secondary | ICD-10-CM

## 2021-12-11 DIAGNOSIS — R933 Abnormal findings on diagnostic imaging of other parts of digestive tract: Secondary | ICD-10-CM | POA: Diagnosis not present

## 2021-12-11 DIAGNOSIS — K219 Gastro-esophageal reflux disease without esophagitis: Secondary | ICD-10-CM

## 2021-12-11 DIAGNOSIS — K5900 Constipation, unspecified: Secondary | ICD-10-CM

## 2021-12-11 MED ORDER — NA SULFATE-K SULFATE-MG SULF 17.5-3.13-1.6 GM/177ML PO SOLN
1.0000 | Freq: Once | ORAL | 0 refills | Status: AC
Start: 2021-12-11 — End: 2021-12-11

## 2021-12-11 NOTE — Patient Instructions (Signed)
If you are age 35 or older, your body mass index should be between 23-30. Your Body mass index is 19.77 kg/m. If this is out of the aforementioned range listed, please consider follow up with your Primary Care Provider.  If you are age 39 or younger, your body mass index should be between 19-25. Your Body mass index is 19.77 kg/m. If this is out of the aformentioned range listed, please consider follow up with your Primary Care Provider.   Take Linzess 145 mcg once daily.   You have been scheduled for an endoscopy and colonoscopy. Please follow the written instructions given to you at your visit today. Please pick up your prep supplies at the pharmacy within the next 1-3 days. If you use inhalers (even only as needed), please bring them with you on the day of your procedure.   The [facility] providers would like to encourage you to use [facility] to communicate with providers for non-urgent requests or questions.  Due to long hold times on the telephone, sending your provider a message by [facility] may be a faster and more efficient way to get a response.  Please allow 48 business hours for a response.  Please remember that this is for non-urgent requests.   It was a pleasure to see you today!  Thank you for trusting me with your gastrointestinal care!    Doug Sou, PA-C

## 2021-12-11 NOTE — Progress Notes (Signed)
12/11/2021 Gabriel Dalton [id number] [birth year]   HISTORY OF PRESENT ILLNESS: This is a 36 year old male is a patient of Dr. Elana Alm.  He has followed here for complaints of abdominal pain and mostly some upper GI symptoms.  He was last seen here in March 2023 by Dr. Lavon Paganini at which time they were treating him for gastritis, uncontrolled acid reflux, and possible cannabis induced symptoms.  He is here today with complaints of abdominal pain, bloating, constipation.  He says that his abdomen gets very bloated and distended.  He has some burning in his stomach after meals.  He says been having a lot of constipation and pressure in his rectum.  He says that he can only eat one time a day.  He has been having severe abdominal pain for the past month he has barely had a bowel movement.  They treated him for a prostate infection.  He has had a little bit of blood once or twice over the past few weeks with his bowel movements.  He tells me "I either want another colonoscopy or want to be referred to [facility]".  CT scan abdomen and pelvis with contrast on 11/22/2021 showed no acute pathology.  Colonoscopy 04/2019: - Friability in the terminal ileum. Biopsied. - Mucosal ulceration. Biopsied.  EGD 04/2019: - Normal esophagus. - Small hiatal hernia. - Enlarged gastric folds with hemorrhage. Biopsied. - Non-bleeding gastric ulcers. - Gastritis with hemorrhage. Biopsied. - Normal examined duodenum. Biopsied.   Surgical [P], stomach, antral BX - ANTRAL MUCOSA WITH MILD INFLAMMATION AND FOCAL EROSION. [place] NEGATIVE FOR HELICOBACTER PYLORI. - NO INTESTINAL METAPLASIA, DYSPLASIA OR MALIGNANCY. 2. Surgical [P], random antrum and body BX - ANTRAL AND OXYNTIC MUCOSA WITH MILD CHRONIC INFLAMMATION. [place] NEGATIVE FOR HELICOBACTER PYLORI. - NO INTESTINAL METAPLASIA, DYSPLASIA OR MALIGNANCY. 3. Surgical [P], small bowel, terminal ileum BX - ILEAL MUCOSA WITH BENIGN  LYMPHOID AGGREGATE. - NO ACTIVE INFLAMMATION, CHRONIC CHANGES OR GRANULOMAS. 4. Surgical [P], right colon BX - COLONIC MUCOSA WITH MILD EDEMA. - NO MICROSCOPIC COLITIS, ACTIVE INFLAMMATION OR GRANULOMAS. - NEGATIVE FOR DYSPLASIA. 5. Surgical [P], left colon BX - COLONIC MUCOSA WITH MILD EDEMA. - NO MICROSCOPIC COLITIS, ACTIVE INFLAMMATION OR GRANULOMAS. - NEGATIVE FOR DYSPLASIA.   Past Medical History:  Diagnosis Date   Bronchitis    Multiple gastric ulcers    Past Surgical History:  Procedure Laterality Date   COLONOSCOPY     I & D EXTREMITY  10/18/2011   Procedure: IRRIGATION AND DEBRIDEMENT EXTREMITY;  Surgeon: Dominica Severin, MD;  Location: [facility] OR;  Service: Orthopedics;  Laterality: Right;  Irrigation and Debridement  Right Middle Finger; Removal of foreign body    reports that he has been smoking cigarettes. He has been smoking an average of .5 packs per day. He has never used smokeless tobacco. He reports that he does not currently use alcohol. He reports that he does not currently use drugs. family history includes Brain cancer in his maternal uncle; Colon cancer in his maternal uncle; Healthy in his mother; Heart disease in his maternal aunt; Hypertension in his father; Rectal cancer in his maternal uncle. Allergies  Allergen Reactions   Aspirin     Cannot take due to stomach ulcers      Outpatient Encounter Medications as of 12/11/2021  Medication Sig   acetaminophen (TYLENOL) 325 MG tablet Take 650 mg by mouth every 6 (six) hours as needed for mild pain, fever or headache.   doxycycline (VIBRAMYCIN) 100 MG capsule  Take 1 capsule (100 mg total) by mouth 2 (two) times daily for 14 days.   esomeprazole (NEXIUM) 40 MG capsule Take 1 capsule daily at least 30 minutes before first dose of Carafate. (Patient not taking: Reported on 12/11/2021)   HYDROcodone-acetaminophen (NORCO/VICODIN) 5-325 MG tablet Take 1 tablet by mouth every 4 (four) hours as needed. (Patient not taking:  Reported on 12/11/2021)   sucralfate (CARAFATE) 1 g tablet Take 1 tablet (1 g total) by mouth 4 (four) times daily -  with meals and at bedtime. (Patient not taking: Reported on 12/11/2021)   No facility-administered encounter medications on file as of 12/11/2021.    REVIEW OF SYSTEMS  : All other systems reviewed and negative except where noted in the History of Present Illness.   PHYSICAL EXAM: BP 138/72   Pulse 78   Ht 5\' 8"  (1.727 m)   Wt 130 lb (59 kg)   BMI 19.77 kg/m  General: Well developed AA male in no acute distress Head: Normocephalic and atraumatic Eyes:  Sclerae anicteric, conjunctiva pink. Ears: Normal auditory acuity Lungs: Clear throughout to auscultation; no W/R/R. Heart: Regular rate and rhythm; no M/R/G. Abdomen: Soft, non-distended.  BS present.  Mild diffuse TTP. Rectal:  Will be done at the time of colonoscopy. Musculoskeletal: Symmetrical with no gross deformities  Skin: No lesions on visible extremities Extremities: No edema  Neurological: Alert oriented x 4, grossly non-focal Psychological:  Alert and cooperative. Normal mood and affect  ASSESSMENT AND PLAN: *GERD, gastritis, history of gastric ulcers, upper GI symptoms possibly related to cannabis use: We will plan for repeat EGD with Dr. .  Not on any acid reflux medication currently. *Generalized abdominal pain with constipation and previous abnormal colonoscopy.  Colonoscopy in 04/2019 showed friability of the terminal ileum and mucosal ulcerations.  Biopsy showed edema, but nothing consistent with IBD.  We will repeat colonoscopy to see if all of this has healed or if it has progressed.  Has had several negative CT scans.  In the meantime we are going to have him try Linzess 145 mcg daily.  He was given samples of this today.  **The risks, benefits, and alternatives to EGD and colonoscopy were discussed with the patient and he consents to proceed.   CC:  05/2019,*

## 2021-12-26 ENCOUNTER — Encounter: Payer: Self-pay | Admitting: Gastroenterology

## 2021-12-26 DIAGNOSIS — R933 Abnormal findings on diagnostic imaging of other parts of digestive tract: Secondary | ICD-10-CM | POA: Insufficient documentation

## 2021-12-26 DIAGNOSIS — K219 Gastro-esophageal reflux disease without esophagitis: Secondary | ICD-10-CM | POA: Insufficient documentation

## 2021-12-26 DIAGNOSIS — K5900 Constipation, unspecified: Secondary | ICD-10-CM | POA: Insufficient documentation

## 2021-12-31 ENCOUNTER — Encounter: Payer: Self-pay | Admitting: Gastroenterology

## 2022-01-07 ENCOUNTER — Encounter: Payer: Self-pay | Admitting: Gastroenterology

## 2022-01-07 ENCOUNTER — Ambulatory Visit (AMBULATORY_SURGERY_CENTER): Payer: 59 | Admitting: Gastroenterology

## 2022-01-07 VITALS — BP 117/76 | HR 73 | Temp 97.3°F | Resp 11 | Ht 68.0 in | Wt 130.0 lb

## 2022-01-07 DIAGNOSIS — R109 Unspecified abdominal pain: Secondary | ICD-10-CM

## 2022-01-07 DIAGNOSIS — K2100 Gastro-esophageal reflux disease with esophagitis, without bleeding: Secondary | ICD-10-CM | POA: Diagnosis not present

## 2022-01-07 DIAGNOSIS — K2960 Other gastritis without bleeding: Secondary | ICD-10-CM | POA: Diagnosis not present

## 2022-01-07 DIAGNOSIS — K5900 Constipation, unspecified: Secondary | ICD-10-CM | POA: Diagnosis not present

## 2022-01-07 DIAGNOSIS — K648 Other hemorrhoids: Secondary | ICD-10-CM | POA: Diagnosis not present

## 2022-01-07 DIAGNOSIS — K319 Disease of stomach and duodenum, unspecified: Secondary | ICD-10-CM | POA: Diagnosis not present

## 2022-01-07 MED ORDER — SODIUM CHLORIDE 0.9 % IV SOLN: 500.0000 mL | Freq: Once | INTRAVENOUS | Status: DC

## 2022-01-07 NOTE — Op Note (Signed)
[facility] Patient Name: Gabriel Dalton Procedure Date: 01/07/2022 3:34 PM MRN: [id number] Endoscopist: Napoleon Form , MD Age: 36 Referring MD:  Date of Birth: 02/03/1986 Gender: Male Account #: [id number] Procedure:                Upper GI endoscopy Indications:              Epigastric abdominal pain, Esophageal reflux                            symptoms that persist despite appropriate therapy Medicines:                Monitored Anesthesia Care Procedure:                Pre-Anesthesia Assessment:                           - Prior to the procedure, a History and Physical                            was performed, and patient medications and                            allergies were reviewed. The patient's tolerance of                            previous anesthesia was also reviewed. The risks                            and benefits of the procedure and the sedation                            options and risks were discussed with the patient.                            All questions were answered, and informed consent                            was obtained. Prior Anticoagulants: The patient has                            taken no previous anticoagulant or antiplatelet                            agents. ASA Grade Assessment: II - A patient with                            mild systemic disease. After reviewing the risks                            and benefits, the patient was deemed in                            satisfactory condition to undergo the procedure.  After obtaining informed consent, the endoscope was                            passed under direct vision. Throughout the                            procedure, the patient's blood pressure, pulse, and                            oxygen saturations were monitored continuously. The                            Endoscope was introduced through the mouth, and                             advanced to the second part of duodenum. The upper                            GI endoscopy was accomplished without difficulty.                            The patient tolerated the procedure well. Scope In: Scope Out: Findings:                 LA Grade B (one or more mucosal breaks greater than                            5 mm, not extending between the tops of two mucosal                            folds) esophagitis with no bleeding was found 37 to                            38 cm from the incisors.                           Patchy mild inflammation characterized by                            congestion (edema) and erythema was found in the                            gastric antrum and in the prepyloric region of the                            stomach. Biopsies were taken with a cold forceps                            for Helicobacter pylori testing.                           The cardia and gastric fundus were normal on  retroflexion.                           The examined duodenum was normal. Complications:            No immediate complications. Estimated Blood Loss:     Estimated blood loss was minimal. Impression:               - LA Grade B reflux esophagitis with no bleeding.                           - Gastritis. Biopsied.                           - Normal examined duodenum. Recommendation:           - Patient has a contact number available for                            emergencies. The signs and symptoms of potential                            delayed complications were discussed with the                            patient. Return to normal activities tomorrow.                            Written discharge instructions were provided to the                            patient.                           - Resume previous diet.                           - Continue present medications.                           - Await pathology results.                            - Follow an antireflux regimen.                           - Continue Nexium                           -Avoid NSAID's Napoleon Form, MD 01/07/2022 4:19:14 PM This report has been signed electronically.

## 2022-01-07 NOTE — Progress Notes (Signed)
Called to room to assist during endoscopic procedure.  Patient ID and intended procedure confirmed with present staff. Received instructions for my participation in the procedure from the performing physician.  

## 2022-01-07 NOTE — Progress Notes (Unsigned)
Please refer to office visit note 12/11/21. No additional changes in H&P Patient is appropriate for planned procedure(s) and anesthesia in an ambulatory setting  K. Scherry Ran , MD [phone]

## 2022-01-07 NOTE — Op Note (Signed)
[facility] Patient Name: Gabriel Dalton Procedure Date: 01/07/2022 2:52 PM MRN: [id number] Endoscopist: Napoleon Form , MD Age: 36 Referring MD:  Date of Birth: 10/01/1985 Gender: Male Account #: [id number] Procedure:                Colonoscopy Indications:              Obtain more precise diagnosis of inflammatory bowel                            disease, Generalized abdominal pain, Change in                            bowel habits, Constipation Medicines:                Monitored Anesthesia Care Procedure:                Pre-Anesthesia Assessment:                           - Prior to the procedure, a History and Physical                            was performed, and patient medications and                            allergies were reviewed. The patient's tolerance of                            previous anesthesia was also reviewed. The risks                            and benefits of the procedure and the sedation                            options and risks were discussed with the patient.                            All questions were answered, and informed consent                            was obtained. Prior Anticoagulants: The patient has                            taken no previous anticoagulant or antiplatelet                            agents. ASA Grade Assessment: II - A patient with                            mild systemic disease. After reviewing the risks                            and benefits, the patient was deemed in  satisfactory condition to undergo the procedure.                           After obtaining informed consent, the colonoscope                            was passed under direct vision. Throughout the                            procedure, the patient's blood pressure, pulse, and                            oxygen saturations were monitored continuously. The                            Olympus PCF-[id number]  (#[id number]) Colonoscope was                            introduced through the anus and advanced to the the                            cecum, identified by appendiceal orifice and                            ileocecal valve. The colonoscopy was performed                            without difficulty. The patient tolerated the                            procedure well. The quality of the bowel                            preparation was good. The ileocecal valve,                            appendiceal orifice, and rectum were photographed. Scope In: 3:55:03 PM Scope Out: 4:10:11 PM Scope Withdrawal Time: 0 hours 8 minutes 58 seconds  Total Procedure Duration: 0 hours 15 minutes 8 seconds  Findings:                 The perianal and digital rectal examinations were                            normal.                           The terminal ileum appeared normal.                           A patchy area of mildly erythematous mucosa was                            found in the entire colon. Biopsies were taken with  a cold forceps for histology.                           Non-bleeding external and internal hemorrhoids were                            found during retroflexion. The hemorrhoids were                            medium-sized. Complications:            No immediate complications. Estimated Blood Loss:     Estimated blood loss was minimal. Impression:               - The examined portion of the ileum was normal.                           - Erythematous mucosa in the entire examined colon.                            Biopsied.                           - Non-bleeding external and internal hemorrhoids. Recommendation:           - Patient has a contact number available for                            emergencies. The signs and symptoms of potential                            delayed complications were discussed with the                            patient. Return to  normal activities tomorrow.                            Written discharge instructions were provided to the                            patient.                           - Resume previous diet.                           - Continue present medications.                           - Await pathology results.                           - Repeat colonoscopy in 10 years for surveillance                            based on pathology results. Napoleon Form, MD 01/07/2022 4:16:04 PM This report has been signed electronically.

## 2022-01-07 NOTE — Patient Instructions (Signed)
AWAIT RESULTS OF COLON BIOPSIES   HANDOUTS ON ESOPHAGITIS & GASTRITIS GIVEN TO YOU TODAY  AWAIT GASTRIC BIOPSY RESULTS  FOLLOW ANTI REFLUX REGIMEN- ORANGE HANDOUT GIVEN TO YOU  CONTINUE TAKING NEXIUM  AVOID ALL NSAIDS (ALEVE ,MOTRIN, ADVIL,NAPROXEN,ETC)- ANTI INFLAMMATORY MEDICATIONS     YOU HAD AN ENDOSCOPIC PROCEDURE TODAY AT THE [facility]:   Refer to the procedure report that was given to you for any specific questions about what was found during the examination.  If the procedure report does not answer your questions, please call your gastroenterologist to clarify.  If you requested that your care partner not be given the details of your procedure findings, then the procedure report has been included in a sealed envelope for you to review at your convenience later.  YOU SHOULD EXPECT: Some feelings of bloating in the abdomen. Passage of more gas than usual.  Walking can help get rid of the air that was put into your GI tract during the procedure and reduce the bloating. If you had a lower endoscopy (such as a colonoscopy or flexible sigmoidoscopy) you may notice spotting of blood in your stool or on the toilet paper. If you underwent a bowel prep for your procedure, you may not have a normal bowel movement for a few days.  Please Note:  You might notice some irritation and congestion in your nose or some drainage.  This is from the oxygen used during your procedure.  There is no need for concern and it should clear up in a day or so.  SYMPTOMS TO REPORT IMMEDIATELY:  Following lower endoscopy (colonoscopy or flexible sigmoidoscopy):  Excessive amounts of blood in the stool  Significant tenderness or worsening of abdominal pains  Swelling of the abdomen that is new, acute  Fever of 100F or higher  Following upper endoscopy (EGD)  Vomiting of blood or coffee ground material  New chest pain or pain under the shoulder blades  Painful or persistently difficult  swallowing  New shortness of breath  Fever of 100F or higher  Black, tarry-looking stools  For urgent or emergent issues, a gastroenterologist can be reached at any hour by calling [phone]. Do not use MyChart messaging for urgent concerns.    DIET:  We do recommend a small meal at first, but then you may proceed to your regular diet.  Drink plenty of fluids but you should avoid alcoholic beverages for 24 hours.  ACTIVITY:  You should plan to take it easy for the rest of today and you should NOT DRIVE or use heavy machinery until tomorrow (because of the sedation medicines used during the test).    FOLLOW UP: Our staff will call the number listed on your records the next business day following your procedure.  We will call around 7:15- 8:00 am to check on you and address any questions or concerns that you may have regarding the information given to you following your procedure. If we do not reach you, we will leave a message.  If you develop any symptoms (ie: fever, flu-like symptoms, shortness of breath, cough etc.) before then, please call [phone].  If you test positive for Covid 19 in the 2 weeks post procedure, please call and report this information to [place].    If any biopsies were taken you will be contacted by phone or by letter within the next 1-3 weeks.  Please call us at [phone] if you have not heard about the biopsies in 3 weeks.  SIGNATURES/CONFIDENTIALITY: You and/or your care partner have signed paperwork which will be entered into your electronic medical record.  These signatures attest to the fact that that the information above on your After Visit Summary has been reviewed and is understood.  Full responsibility of the confidentiality of this discharge information lies with you and/or your care-partner.

## 2022-01-07 NOTE — Progress Notes (Unsigned)
Report to pacu rn. Vss. Care resumed by rn. 

## 2022-01-08 ENCOUNTER — Telehealth: Payer: Self-pay

## 2022-01-08 NOTE — Telephone Encounter (Signed)
Attempted f/u call. No answer, left VM. 

## 2022-01-19 ENCOUNTER — Encounter: Payer: Self-pay | Admitting: Gastroenterology

## 2022-02-23 IMAGING — MR MR ABDOMEN WO/W CM
18 series · 48 of 48 positions shown · IV contrast (multihance)
Comparison: CT November 11, 2020

CLINICAL DATA: Left upper quadrant abdominal pain.

EXAM:
MRI ABDOMEN WITHOUT AND WITH CONTRAST
TECHNIQUE: Multiplanar multisequence MR imaging of the abdomen was performed
both before and after the administration of intravenous contrast.
CONTRAST:  11mL MULTIHANCE GADOBENATE DIMEGLUMINE 529 MG/ML IV SOLN

[Series 3: T2 · coronal · 4.5mm · 1.56mm/px · 1 of 34 slices shown (1 of 3)]
[im 1/34]
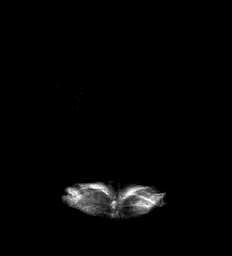

[Series 4: T1 · axial · 3.0mm · 1.12mm/px · z∈[-145,+92]mm · 6 of 160 slices shown]
[im 1/160]
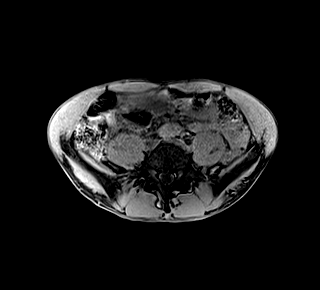
[im 32/160]
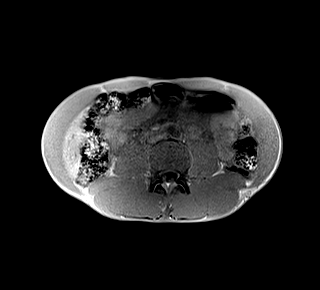
[im 64/160]
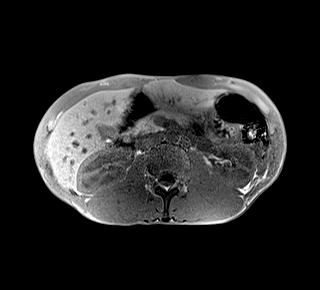
[im 96/160]
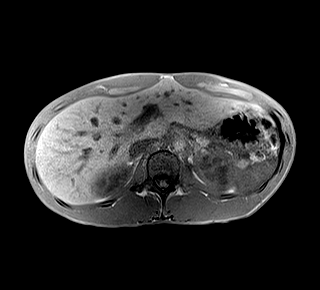
[im 128/160]
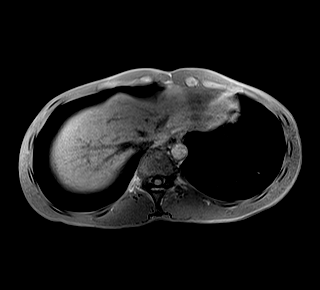
[im 160/160]
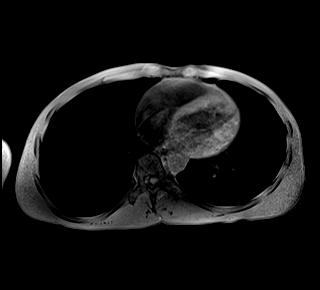

[Series 5: T2 · axial · 6.0mm · 1.12mm/px · 1 of 33 slices shown (2 of 3)]
[im 1/33]
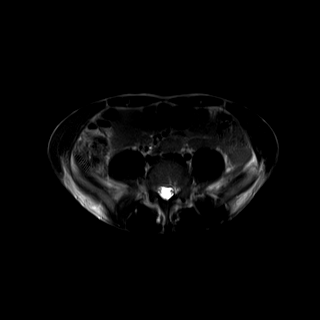

[Series 6: bSSFP · axial · 5.0mm · 1.25mm/px · 1 of 42 slices shown (1 of 2)]
[im 1/42]
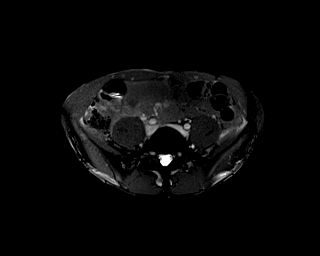

[Series 7: bSSFP · coronal · 5.0mm · 1.25mm/px · 1 of 32 slices shown (2 of 2)]
[im 1/32]
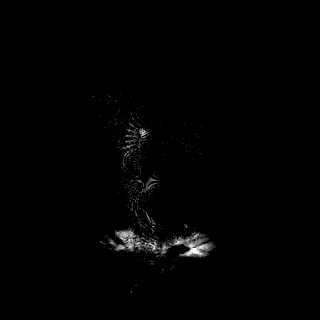

[Series 8: T2 · axial · 5.0mm · 1.41mm/px · z∈[-166,+104]mm · 2 of 46 slices shown (3 of 3)]
[im 1/46]
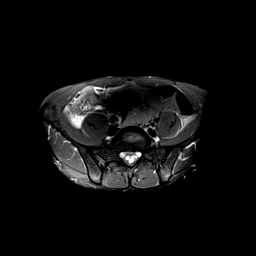
[im 46/46]
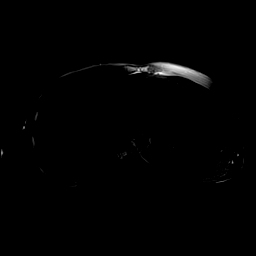

[Series 9: DWI · axial · 5.0mm · 1.42mm/px · z∈[-166,+104]mm · 5 of 137 slices shown (1 of 2)]
[im 1/137]
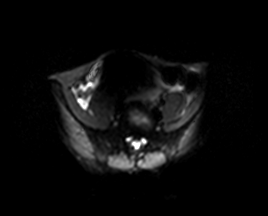
[im 35/137]
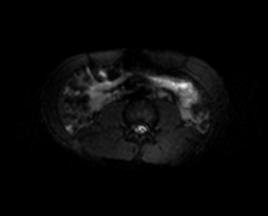
[im 69/137]
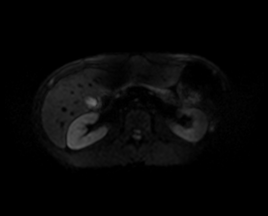
[im 103/137]
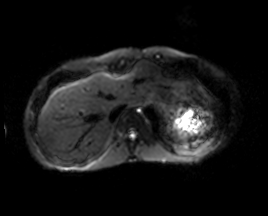
[im 137/137]
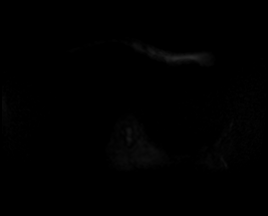

[Series 10: DWI · axial · 5.0mm · 1.42mm/px · z∈[-166,+104]mm · 2 of 46 slices shown (2 of 2)]
[im 1/46]
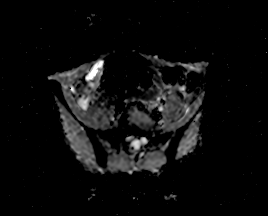
[im 46/46]
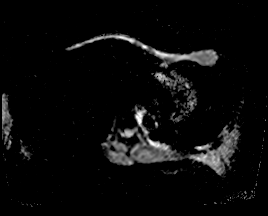

[Series 11: T1 dynamic · axial · non-contrast · 3.0mm · 1.19mm/px · z∈[-157,+104]mm · 3 of 88 slices shown]
[im 1/88]
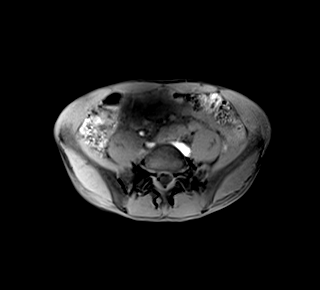
[im 44/88]
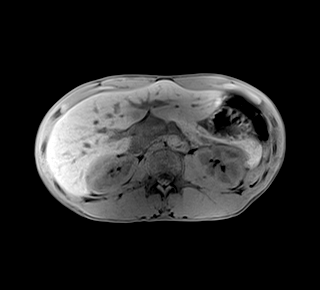
[im 88/88]
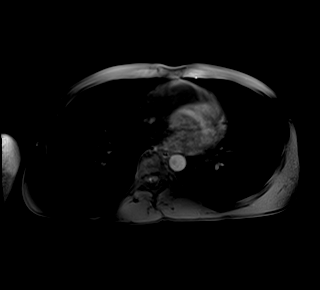

[Series 12: T1 dynamic post-contrast · axial · 3.0mm · 1.19mm/px · z∈[-157,+104]mm · 3 of 88 slices shown (1 of 9)]
[im 1/88]
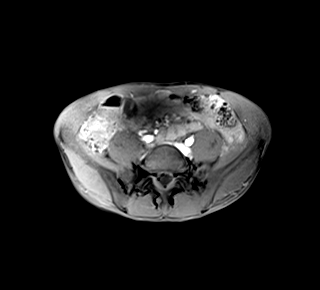
[im 44/88]
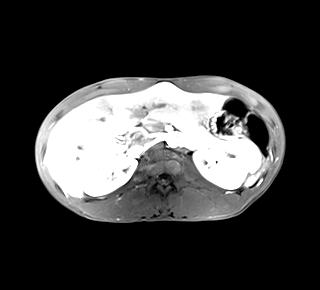
[im 88/88]
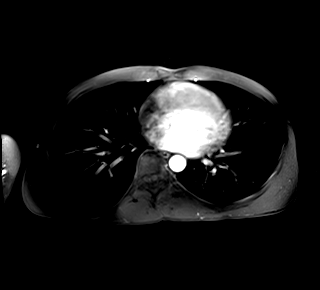

[Series 13: T1 dynamic post-contrast · axial · 3.0mm · 1.19mm/px · z∈[-157,+104]mm · 3 of 88 slices shown (2 of 9)]
[im 1/88]
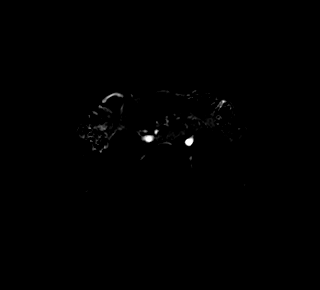
[im 44/88]
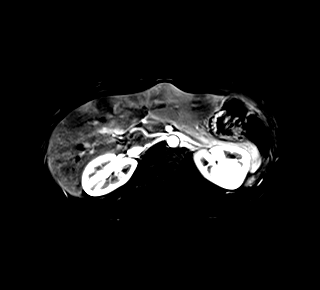
[im 88/88]
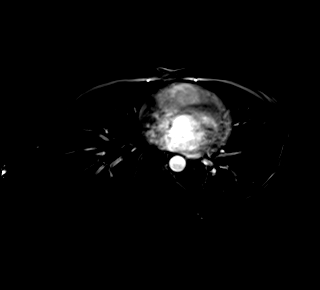

[Series 14: T1 dynamic post-contrast · axial · 3.0mm · 1.19mm/px · z∈[-157,+104]mm · 3 of 88 slices shown (3 of 9)]
[im 1/88]
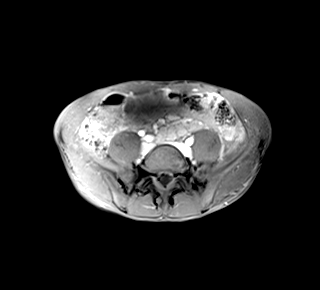
[im 44/88]
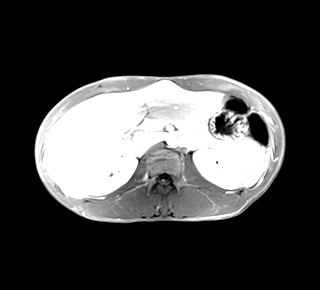
[im 88/88]
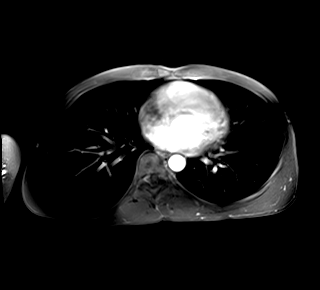

[Series 15: T1 dynamic post-contrast · axial · 3.0mm · 1.19mm/px · z∈[-157,+104]mm · 3 of 88 slices shown (4 of 9)]
[im 1/88]
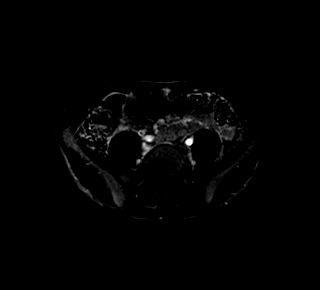
[im 44/88]
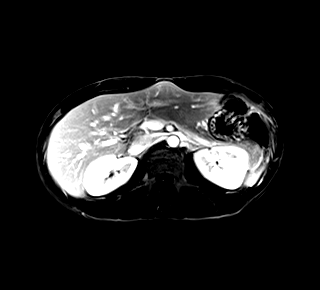
[im 88/88]
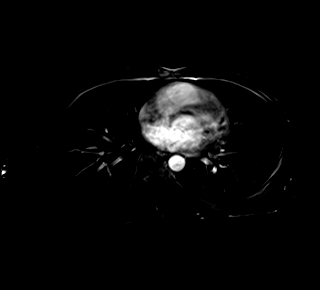

[Series 16: T1 dynamic post-contrast · axial · 3.0mm · 1.19mm/px · z∈[-157,+104]mm · 3 of 88 slices shown (5 of 9)]
[im 1/88]
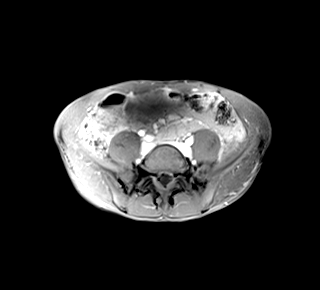
[im 44/88]
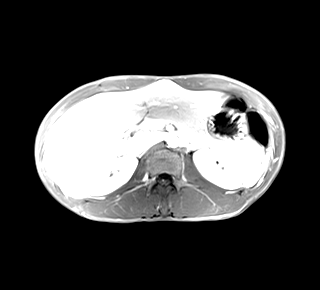
[im 88/88]
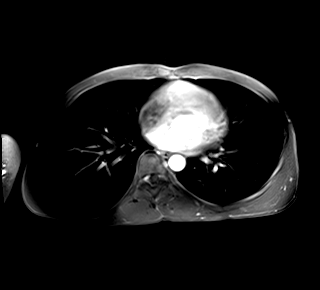

[Series 17: T1 dynamic post-contrast · axial · 3.0mm · 1.19mm/px · z∈[-157,+104]mm · 3 of 88 slices shown (6 of 9)]
[im 1/88]
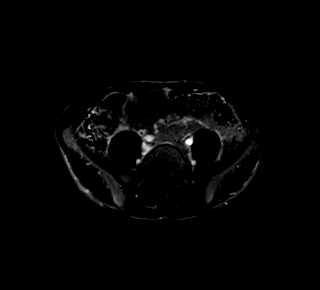
[im 44/88]
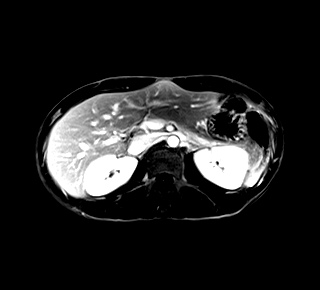
[im 88/88]
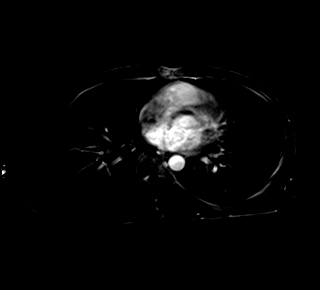

[Series 18: T1 dynamic post-contrast · coronal · 3.0mm · 1.25mm/px · 2 of 60 slices shown (7 of 9)]
[im 1/60]
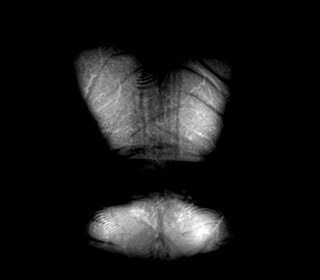
[im 60/60]
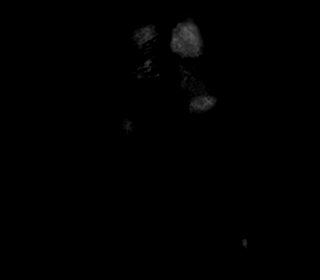

[Series 19: T1 dynamic post-contrast · axial · 3.0mm · 1.19mm/px · z∈[-157,+104]mm · 3 of 88 slices shown (8 of 9)]
[im 1/88]
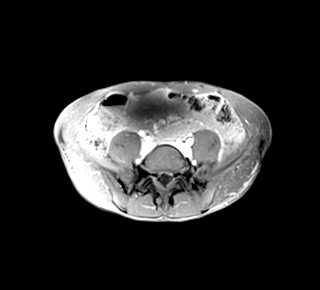
[im 44/88]
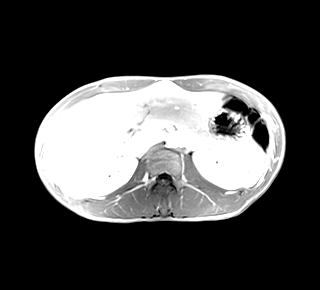
[im 88/88]
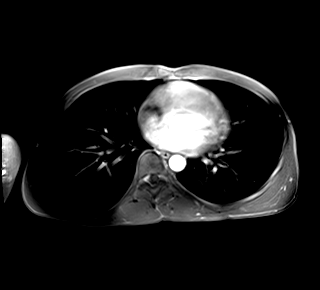

[Series 20: T1 dynamic post-contrast · axial · 3.0mm · 1.19mm/px · z∈[-157,+104]mm · 3 of 88 slices shown (9 of 9)]
[im 1/88]
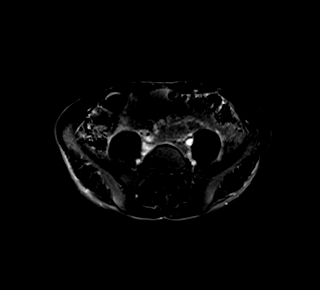
[im 44/88]
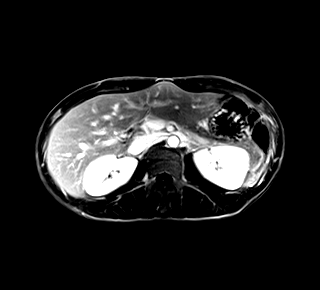
[im 88/88]
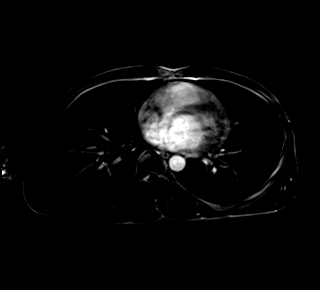

[48 of 48 positions shown; findings below may reference images not displayed]

FINDINGS: Lower chest: No acute abnormality.

Hepatobiliary: No hepatic steatosis. Tiny hepatic cyst in the right
lobe of the liver on image [DATE]. No suspicious hepatic lesions.
Gallbladder is unremarkable. No biliary ductal dilation.

Pancreas: Intrinsic T1 signal of the pancreatic parenchyma is within
normal limits. No pancreatic ductal dilation. Pancreatic divisum, an
anatomic variant. No cystic or arterially enhancing pancreatic
lesions identified.

Spleen:  Within normal limits.

Adrenals/Urinary Tract: Bilateral adrenal glands are unremarkable.
Tiny 5 mm smaller bilateral renal cysts. No solid enhancing renal
masses. No hydronephrosis.

Stomach/Bowel: Visualized portions within the abdomen are
unremarkable.

Vascular/Lymphatic: No pathologically enlarged lymph nodes
identified. No abdominal aortic aneurysm demonstrated.

Other:  No abdominal ascites.

Musculoskeletal: No suspicious bone lesions identified.
IMPRESSION: Pancreatic divisum, an anatomic variant.

No acute abnormality in the abdomen visualized.

## 2022-04-14 ENCOUNTER — Emergency Department (HOSPITAL_BASED_OUTPATIENT_CLINIC_OR_DEPARTMENT_OTHER)
Admission: EM | Admit: 2022-04-14 | Discharge: 2022-04-14 | Disposition: A | Discharge status: Home / Self Care | Payer: 59 | Attending: Emergency Medicine | Admitting: Emergency Medicine

## 2022-04-14 ENCOUNTER — Other Ambulatory Visit: Payer: Self-pay

## 2022-04-14 ENCOUNTER — Encounter (HOSPITAL_BASED_OUTPATIENT_CLINIC_OR_DEPARTMENT_OTHER): Payer: Self-pay

## 2022-04-14 DIAGNOSIS — E860 Dehydration: Secondary | ICD-10-CM

## 2022-04-14 DIAGNOSIS — R112 Nausea with vomiting, unspecified: Secondary | ICD-10-CM

## 2022-04-14 DIAGNOSIS — Z20822 Contact with and (suspected) exposure to covid-19: Secondary | ICD-10-CM | POA: Diagnosis not present

## 2022-04-14 DIAGNOSIS — R1010 Upper abdominal pain, unspecified: Secondary | ICD-10-CM

## 2022-04-14 LAB — COMPREHENSIVE METABOLIC PANEL
ALT: 15 U/L (ref 0–44)
AST: 17 U/L (ref 15–41)
Albumin: 5.1 g/dL — ABNORMAL HIGH (ref 3.5–5.0)
Alkaline Phosphatase: 86 U/L (ref 38–126)
Anion gap: 11 (ref 5–15)
BUN: 14 mg/dL (ref 6–20)
CO2: 26 mmol/L (ref 22–32)
Calcium: 10.3 mg/dL (ref 8.9–10.3)
Chloride: 100 mmol/L (ref 98–111)
Creatinine, Ser: 0.91 mg/dL (ref 0.61–1.24)
GFR, Estimated: >60 mL/min (ref >60)
Glucose, Bld: 105 mg/dL — ABNORMAL HIGH (ref 70–99)
Potassium: 4 mmol/L (ref 3.5–5.1)
Sodium: 137 mmol/L (ref 135–145)
Total Bilirubin: 1.2 mg/dL (ref 0.3–1.2)
Total Protein: 8.5 g/dL — ABNORMAL HIGH (ref 6.5–8.1)

## 2022-04-14 LAB — CBC
HCT: 40.2 % (ref 39.0–52.0)
Hemoglobin: 14.2 g/dL (ref 13.0–17.0)
MCH: 30.6 pg (ref 26.0–34.0)
MCHC: 35.3 g/dL (ref 30.0–36.0)
MCV: 86.6 fL (ref 80.0–100.0)
Platelets: 396 10*3/uL (ref 150–400)
RBC: 4.64 MIL/uL (ref 4.22–5.81)
RDW: 14.3 % (ref 11.5–15.5)
WBC: 11.1 10*3/uL — ABNORMAL HIGH (ref 4.0–10.5)
nRBC: 0 % (ref 0.0–0.2)

## 2022-04-14 LAB — URINALYSIS, ROUTINE W REFLEX MICROSCOPIC
Bilirubin Urine: NEGATIVE
Glucose, UA: NEGATIVE mg/dL
Hgb urine dipstick: NEGATIVE
Ketones, ur: 15 mg/dL — AB
Leukocytes,Ua: NEGATIVE
Nitrite: NEGATIVE
Protein, ur: >300 mg/dL — AB
Specific Gravity, Urine: 1.037 — ABNORMAL HIGH (ref 1.005–1.030)
pH: 6.5 (ref 5.0–8.0)

## 2022-04-14 LAB — RESP PANEL BY RT-PCR (FLU A&B, COVID) ARPGX2
Influenza A by PCR: NEGATIVE
Influenza B by PCR: NEGATIVE
SARS Coronavirus 2 by RT PCR: NEGATIVE

## 2022-04-14 LAB — LIPASE, BLOOD: Lipase: <10 U/L — ABNORMAL LOW (ref 11–51)

## 2022-04-14 MED ORDER — PANTOPRAZOLE SODIUM 40 MG IV SOLR
40.0000 mg | Freq: Once | INTRAVENOUS | Status: AC
  Administered 2022-04-14: 40 mg via INTRAVENOUS
  Filled 2022-04-14: qty 10

## 2022-04-14 MED ORDER — PANTOPRAZOLE SODIUM 40 MG PO TBEC: 40.0000 mg | DELAYED_RELEASE_TABLET | Freq: Every day | ORAL | 0 refills | Status: DC

## 2022-04-14 MED ORDER — ONDANSETRON 8 MG PO TBDP: 8.0000 mg | ORAL_TABLET | Freq: Three times a day (TID) | ORAL | 0 refills | Status: DC | PRN

## 2022-04-14 MED ORDER — HYDROMORPHONE HCL 1 MG/ML IJ SOLN
0.5000 mg | Freq: Once | INTRAMUSCULAR | Status: AC
  Administered 2022-04-14: 0.5 mg via INTRAVENOUS
  Filled 2022-04-14: qty 1

## 2022-04-14 MED ORDER — SODIUM CHLORIDE 0.9 % IV BOLUS
1000.0000 mL | Freq: Once | INTRAVENOUS | Status: AC
  Administered 2022-04-14: 1000 mL via INTRAVENOUS

## 2022-04-14 NOTE — ED Triage Notes (Signed)
Patient here POV from Home.  Endorses Vomiting since Yesterday AM. Associated with Chills, Aches and some Hematemesis today. No Known fevers.   NAD Noted during Triage. A&Ox4. GCS 15. BIB Wheelchair.

## 2022-04-14 NOTE — Discharge Instructions (Addendum)
It was our pleasure to provide your ER care today - we hope that you feel better.  Drink plenty of fluids/stay well hydrated. Take zofran as need for nausea. Take protonix (acid blocker medication).  Follow up with primary care doctor in 1-2 days if symptoms fail to improve/resolve.  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   Return to ER if worse, new symptoms, fevers, new or worsening or severe abdominal pain, persistent vomiting, or other concern.   You were given pain meds in the ER - no driving for the next 6 hours.

## 2022-04-14 NOTE — ED Provider Notes (Signed)
Gabriel Dalton   CSN: [id number] Arrival date & time: 04/14/22  1251     History  Chief Complaint  Patient presents with   Emesis    Gabriel Dalton is a 36 y.o. male.  Pt with c/o nausea and vomiting, and upper abd pain. Symptoms acute onset in past two days. Emesis yellowish, not grossly bloody or bilious. No abd distension. Epigastric pain, no radiating, dull, without specific exacerbating or alleviating factors. Having regular bms. No fever or chills. No known bad food ingestion or ill contacts. Notes hx 'gastritis'.  Also noted w hx thc use. States stopped taking nexium b/c it made him nauseated. No chest pain or discomfort. No sob.   The history is provided by the patient, medical records and a relative.  Emesis Associated symptoms: abdominal pain   Associated symptoms: no cough, no fever, no headaches and no sore throat        Home Medications Prior to Admission medications   Medication Sig Start Date End Date Taking? Authorizing Provider  acetaminophen (TYLENOL) 325 MG tablet Take 650 mg by mouth every 6 (six) hours as needed for mild pain, fever or headache.    [provider]  esomeprazole (NEXIUM) 40 MG capsule Take 1 capsule daily at least 30 minutes before first dose of Carafate. Patient not taking: Reported on 12/11/2021 11/22/21   Molpus, John, MD  HYDROcodone-acetaminophen (NORCO/VICODIN) 5-325 MG tablet Take 1 tablet by mouth every 4 (four) hours as needed. Patient not taking: Reported on 12/11/2021 11/28/21   Suella Broad A, PA-C  sucralfate (CARAFATE) 1 g tablet Take 1 tablet (1 g total) by mouth 4 (four) times daily -  with meals and at bedtime. Patient not taking: Reported on 01/07/2022 11/22/21   Molpus, Jenny Reichmann, MD      Allergies    Aspirin    Review of Systems   Review of Systems  Constitutional:  Negative for fever.  HENT:  Negative for sore throat.   Eyes:  Negative for redness.  Respiratory:   Negative for cough and shortness of breath.   Cardiovascular:  Negative for chest pain.  Gastrointestinal:  Positive for abdominal pain, nausea and vomiting.  Genitourinary:  Negative for dysuria and flank pain.  Musculoskeletal:  Negative for neck pain.  Skin:  Negative for rash.  Neurological:  Negative for headaches.  Hematological:  Does not bruise/bleed easily.  Psychiatric/Behavioral:  Negative for confusion.     Physical Exam Updated Vital Signs BP (!) 128/93 (BP Location: Right Arm)   Pulse 90   Temp 99.4 F (37.4 C) (Oral)   Resp 18   Ht 1.727 m (5\' 8" )   Wt 59 kg   SpO2 100%   BMI 19.78 kg/m  Physical Exam Vitals and nursing Dalton reviewed.  Constitutional:      Appearance: Normal appearance. He is well-developed.  HENT:     Head: Atraumatic.     Nose: Nose normal.     Mouth/Throat:     Mouth: Mucous membranes are moist.     Pharynx: Oropharynx is clear. No oropharyngeal exudate or posterior oropharyngeal erythema.  Eyes:     General: No scleral icterus.    Conjunctiva/sclera: Conjunctivae normal.     Pupils: Pupils are equal, round, and reactive to light.  Neck:     Trachea: No tracheal deviation.  Cardiovascular:     Rate and Rhythm: Normal rate and regular rhythm.     Pulses: Normal pulses.  Heart sounds: Normal heart sounds. No murmur heard.    No friction rub. No gallop.  Pulmonary:     Effort: Pulmonary effort is normal. No accessory muscle usage or respiratory distress.     Breath sounds: Normal breath sounds.  Abdominal:     General: Bowel sounds are normal. There is no distension.     Palpations: Abdomen is soft. There is no mass.     Tenderness: There is no abdominal tenderness. There is no guarding.  Genitourinary:    Comments: No cva tenderness. Musculoskeletal:        General: No swelling or tenderness.     Cervical back: Normal range of motion and neck supple. No rigidity.  Skin:    General: Skin is warm and dry.     Findings: No  rash.  Neurological:     Mental Status: He is alert.     Comments: Alert, speech clear. Motor/sens grossly intact bil. Steady gait.   Psychiatric:        Mood and Affect: Mood normal.     ED Results / Procedures / Treatments   Labs (all labs ordered are listed, but only abnormal results are displayed) Results for orders placed or performed during the hospital encounter of 04/14/22  Resp Panel by RT-PCR (Flu A&B, Covid) Anterior Nasal Swab   Specimen: Anterior Nasal Swab  Result Value Ref Range   SARS Coronavirus 2 by RT PCR NEGATIVE NEGATIVE   Influenza A by PCR NEGATIVE NEGATIVE   Influenza B by PCR NEGATIVE NEGATIVE  Lipase, blood  Result Value Ref Range   Lipase <10 (L) 11 - 51 U/L  Comprehensive metabolic panel  Result Value Ref Range   Sodium 137 135 - 145 mmol/L   Potassium 4.0 3.5 - 5.1 mmol/L   Chloride 100 98 - 111 mmol/L   CO2 26 22 - 32 mmol/L   Glucose, Bld 105 (H) 70 - 99 mg/dL   BUN 14 6 - 20 mg/dL   Creatinine, Ser 4.00 0.61 - 1.24 mg/dL   Calcium 86.7 8.9 - 61.9 mg/dL   Total Protein 8.5 (H) 6.5 - 8.1 g/dL   Albumin 5.1 (H) 3.5 - 5.0 g/dL   AST 17 15 - 41 U/L   ALT 15 0 - 44 U/L   Alkaline Phosphatase 86 38 - 126 U/L   Total Bilirubin 1.2 0.3 - 1.2 mg/dL   GFR, Estimated >50 >93 mL/min   Anion gap 11 5 - 15  CBC  Result Value Ref Range   WBC 11.1 (H) 4.0 - 10.5 K/uL   RBC 4.64 4.22 - 5.81 MIL/uL   Hemoglobin 14.2 13.0 - 17.0 g/dL   HCT 26.7 12.4 - 58.0 %   MCV 86.6 80.0 - 100.0 fL   MCH 30.6 26.0 - 34.0 pg   MCHC 35.3 30.0 - 36.0 g/dL   RDW 99.8 33.8 - 25.0 %   Platelets 396 150 - 400 K/uL   nRBC 0.0 0.0 - 0.2 %  Urinalysis, Routine w reflex microscopic Urine, Clean Catch  Result Value Ref Range   Color, Urine YELLOW YELLOW   APPearance CLEAR CLEAR   Specific Gravity, Urine 1.037 (H) 1.005 - 1.030   pH 6.5 5.0 - 8.0   Glucose, UA NEGATIVE NEGATIVE mg/dL   Hgb urine dipstick NEGATIVE NEGATIVE   Bilirubin Urine NEGATIVE NEGATIVE   Ketones, ur  15 (A) NEGATIVE mg/dL   Protein, ur >539 (A) NEGATIVE mg/dL   Nitrite NEGATIVE NEGATIVE   Leukocytes,Ua  NEGATIVE NEGATIVE   RBC / HPF 6-10 0 - 5 RBC/hpf   WBC, UA 0-5 0 - 5 WBC/hpf   Bacteria, UA MANY (A) NONE SEEN   Squamous Epithelial / LPF 0-5 0 - 5   Mucus PRESENT       EKG None  Radiology No results found.  Procedures Procedures    Medications Ordered in ED Medications  pantoprazole (PROTONIX) injection 40 mg (has no administration in time range)  HYDROmorphone (DILAUDID) injection 0.5 mg (has no administration in time range)  sodium chloride 0.9 % bolus 1,000 mL (has no administration in time range)    ED Course/ Medical Decision Making/ A&P                           Medical Decision Making Problems Addressed: Dehydration: acute illness or injury with systemic symptoms that poses a threat to life or bodily functions Nausea and vomiting in adult: acute illness or injury with systemic symptoms that poses a threat to life or bodily functions Upper abdominal pain: acute illness or injury with systemic symptoms that poses a threat to life or bodily functions  Amount and/or Complexity of Data Reviewed Independent Historian:     Details: Family/hx External Data Reviewed: labs, radiology and notes. Labs: ordered. Decision-making details documented in ED Course.  Risk Prescription drug management. Parenteral controlled substances. Decision regarding hospitalization.   Iv ns. Continuous pulse ox and cardiac monitoring. Labs ordered/sent. Imaging ordered.   Diff dx includes viral ge, food poisoning, dehydration, gastritis/pud, CHS, etc - dispo decision including potential need for admission considered - will get labs, give tx, and reassess.   Reviewed nursing notes and prior charts for additional history. External reports reviewed. Additional history from: family/friend  Cardiac monitor: sinus rhythm, rate 90.  Labs reviewed/interpreted by me - hgb normal. Chem  normal.   Iv ns bolus. Zofran iv. Protonix iv. Dilaudid iv.   Trial of po fluids. Abd soft nt.   Rec pcp f/u.  Return precautions provided.          Final Clinical Impression(s) / ED Diagnoses Final diagnoses:  Nausea and vomiting in adult  Dehydration  Upper abdominal pain    Rx / DC Orders ED Discharge Orders     None         Lajean Saver, MD 04/14/22 [phone]

## 2022-04-16 ENCOUNTER — Telehealth: Payer: Self-pay | Admitting: Family

## 2022-04-16 NOTE — Telephone Encounter (Signed)
error 

## 2022-04-17 ENCOUNTER — Other Ambulatory Visit: Payer: Self-pay | Admitting: Family

## 2022-04-17 ENCOUNTER — Ambulatory Visit (INDEPENDENT_AMBULATORY_CARE_PROVIDER_SITE_OTHER): Payer: 59 | Admitting: Family

## 2022-04-17 ENCOUNTER — Encounter: Payer: Self-pay | Admitting: Family

## 2022-04-17 VITALS — BP 112/78 | HR 75 | Temp 97.9°F | Resp 18 | Ht 68.0 in | Wt 125.0 lb

## 2022-04-17 DIAGNOSIS — F4321 Adjustment disorder with depressed mood: Secondary | ICD-10-CM | POA: Diagnosis not present

## 2022-04-17 DIAGNOSIS — R042 Hemoptysis: Secondary | ICD-10-CM

## 2022-04-17 DIAGNOSIS — R109 Unspecified abdominal pain: Secondary | ICD-10-CM | POA: Diagnosis not present

## 2022-04-17 DIAGNOSIS — R634 Abnormal weight loss: Secondary | ICD-10-CM | POA: Diagnosis not present

## 2022-04-17 DIAGNOSIS — R112 Nausea with vomiting, unspecified: Secondary | ICD-10-CM

## 2022-04-17 NOTE — Patient Instructions (Signed)
Please go ahead and call your GI to schedule a follow up; please take the Protonix twice a day to see if this helps with your symptoms.

## 2022-04-17 NOTE — Progress Notes (Signed)
Gabriel Dalton is a 36 y.o. male with the following history as recorded in [employer]:  Patient Active Problem List   Diagnosis Date Noted   Constipation 12/26/2021   Abnormal colonoscopy 12/26/2021   Gastroesophageal reflux disease 12/26/2021   Intractable abdominal pain 04/25/2019   Hyperbilirubinemia 04/25/2019   Anemia 04/25/2019   Leukocytosis 04/25/2019   Gastric ulcers 04/25/2019   Ulcerated mucosa of rectum 04/25/2019   Melena 04/25/2019   Generalized abdominal pain 04/01/2019   Diarrhea 04/01/2019   Loss of weight 04/01/2019   Screening for viral disease 04/01/2019   Black stools 04/01/2019    Current Outpatient Medications  Medication Sig Dispense Refill   pantoprazole (PROTONIX) 40 MG tablet Take 1 tablet (40 mg total) by mouth daily. 30 tablet 0   acetaminophen (TYLENOL) 325 MG tablet Take 650 mg by mouth every 6 (six) hours as needed for mild pain, fever or headache. (Patient not taking: Reported on 04/17/2022)     HYDROcodone-acetaminophen (NORCO/VICODIN) 5-325 MG tablet Take 1 tablet by mouth every 4 (four) hours as needed. (Patient not taking: Reported on 12/11/2021) 6 tablet 0   ondansetron (ZOFRAN-ODT) 8 MG disintegrating tablet Take 1 tablet (8 mg total) by mouth every 8 (eight) hours as needed for nausea or vomiting. (Patient not taking: Reported on 04/17/2022) 10 tablet 0   sucralfate (CARAFATE) 1 g tablet Take 1 tablet (1 g total) by mouth 4 (four) times daily -  with meals and at bedtime. (Patient not taking: Reported on 01/07/2022) 40 tablet 1   No current facility-administered medications for this visit.    Allergies: Aspirin  Past Medical History:  Diagnosis Date   Bronchitis    Multiple gastric ulcers     Past Surgical History:  Procedure Laterality Date   COLONOSCOPY     I & D EXTREMITY  10/18/2011   Procedure: IRRIGATION AND DEBRIDEMENT EXTREMITY;  Surgeon: Dominica Severin, MD;  Location: [facility] OR;  Service: Orthopedics;  Laterality: Right;   Irrigation and Debridement  Right Middle Finger; Removal of foreign body    Family History  Problem Relation Age of Onset   Healthy Mother    Hypertension Father    Colon cancer Maternal Uncle    Rectal cancer Maternal Uncle    Brain cancer Maternal Uncle    Heart disease Maternal Aunt    Esophageal cancer Neg Hx    Stomach cancer Neg Hx     Social History   Tobacco Use   Smoking status: Every Day    Packs/day: 1.00    Types: Cigarettes   Smokeless tobacco: Never  Substance Use Topics   Alcohol use: Not Currently    Subjective:  Accompanied by girlfriend; has had significant stomach pain on and off for the past 2-3 years; work up to this point has been unremarkable; last saw his GI in August; question has been raised if symptoms are related to chronic marijuana use- per patient, he is still using regularly but "using less." Is asking for pain medication today;  Went to ER early this week with abdominal pain/ nausea and vomiting that patient thought was related to "bad chicken sandwich." Has been able to keep food down in the past 24 hours; does feel that Protonix is working better than Nexium; weight is down 5 pounds compared to earlier this week;   Objective:  Vitals:   04/17/22 0939  BP: 112/78  Pulse: 75  Resp: 18  Temp: 97.9 F (36.6 C)  TempSrc: Oral  SpO2: 98%  Weight: 125 lb (56.7 kg)  Height: 5\' 8"  (1.727 m)    General: Well developed, well nourished, in no acute distress  Skin : Warm and dry.  Head: Normocephalic and atraumatic  Eyes: Sclera and conjunctiva clear; pupils round and reactive to light; extraocular movements intact  Ears: External normal; canals clear; tympanic membranes normal  Oropharynx: Pink, supple. No suspicious lesions  Neck: Supple without thyromegaly, adenopathy  Lungs: Respirations unlabored; clear to auscultation bilaterally without wheeze, rales, rhonchi  CVS exam: normal rate and regular rhythm.  Abdomen: Soft; nontender;  nondistended; normoactive bowel sounds; no masses or hepatosplenomegaly  Neurologic: Alert and oriented; speech intact; face symmetrical; moves all extremities well; CNII-XII intact without focal deficit   Assessment:  1. Recurrent abdominal pain   2. Loss of weight   3. Situational depression   4. Abdominal pain, unspecified abdominal location   5. Hemoptysis   6. Nausea and vomiting, unspecified vomiting type     Plan:  Offered Sucralfate prescription which patient has had in the past and he defers at this time; he is comfortable increasing his Protonix to 40 mg bid; explained that narcotic pain medication is not typically appropriate for chronic abdominal pain; again discussed that chronic marijuana use could be contributing and encouraged to consider stopping altogether; stressed need to follow up with his GI and will put in urgent referral on our end as well; did review labs from ER visit earlier this week/ normal CT in July 2023;  Patient does admit to being depressed secondary to the issues with his stomach but notes he is not actively suicidal and does not want medication/ therapy- he feels his depression will resolve once he can determine what is causing his abdominal pain;   Time spent 30 minutes  No follow-ups on file.  Orders Placed This Encounter  Procedures   Ambulatory referral to Gastroenterology    Referral Priority:   Emergency    Referral Type:   Consultation    Referral Reason:   Specialty Services Required    Referred to Provider:   August 2023, MD    Number of Visits Requested:   1    Requested Prescriptions    No prescriptions requested or ordered in this encounter

## 2022-04-23 ENCOUNTER — Ambulatory Visit (INDEPENDENT_AMBULATORY_CARE_PROVIDER_SITE_OTHER): Payer: 59 | Admitting: Gastroenterology

## 2022-04-23 ENCOUNTER — Encounter: Payer: Self-pay | Admitting: Gastroenterology

## 2022-04-23 VITALS — BP 120/72 | HR 81 | Ht 68.0 in | Wt 130.8 lb

## 2022-04-23 DIAGNOSIS — K209 Esophagitis, unspecified without bleeding: Secondary | ICD-10-CM | POA: Diagnosis not present

## 2022-04-23 DIAGNOSIS — K219 Gastro-esophageal reflux disease without esophagitis: Secondary | ICD-10-CM | POA: Diagnosis not present

## 2022-04-23 DIAGNOSIS — R1084 Generalized abdominal pain: Secondary | ICD-10-CM | POA: Diagnosis not present

## 2022-04-23 DIAGNOSIS — K5900 Constipation, unspecified: Secondary | ICD-10-CM

## 2022-04-23 MED ORDER — LINACLOTIDE 145 MCG PO CAPS: 145.0000 ug | ORAL_CAPSULE | Freq: Every day | ORAL | 3 refills | Status: DC

## 2022-04-23 MED ORDER — PANTOPRAZOLE SODIUM 40 MG PO TBEC: 40.0000 mg | DELAYED_RELEASE_TABLET | Freq: Two times a day (BID) | ORAL | 3 refills | Status: DC

## 2022-04-23 NOTE — Patient Instructions (Signed)
_______________________________________________________  If you are age 35 or older, your body mass index should be between 23-30. Your Body mass index is 19.89 kg/m. If this is out of the aforementioned range listed, please consider follow up with your Primary Care Provider.  If you are age 58 or younger, your body mass index should be between 19-25. Your Body mass index is 19.89 kg/m. If this is out of the aformentioned range listed, please consider follow up with your Primary Care Provider.   We have sent the following medications to your pharmacy for you to pick up at your convenience: Linzess 145 mcg daily . Pantoprazole 40 mg twice daily .   The [facility] providers would like to encourage you to use [facility] to communicate with providers for non-urgent requests or questions.  Due to long hold times on the telephone, sending your provider a message by [facility] may be a faster and more efficient way to get a response.  Please allow 48 business hours for a response.  Please remember that this is for non-urgent requests.   It was a pleasure to see you today!  Thank you for trusting me with your gastrointestinal care!

## 2022-04-23 NOTE — Progress Notes (Signed)
04/23/2022 Gabriel Dalton [id number] [birth year]   HISTORY OF PRESENT ILLNESS: This is a 36 year old male who is a patient of Dr. Elana Alm.  He is here for follow-up of his abdominal pain, reflux, and constipation.  He was last seen by me in August and we scheduled him for repeat EGD and colonoscopy.  He had them in 2020, but we decided to repeat those since he was not having improvement in his symptoms.  Those results from the most recent procedures are listed below.  He has been on pantoprazole 40 mg daily since his EGD and his PCP recently increased it to twice a day.  I have given him samples of Linzess 145 mcg daily at his visit in August and he says that that really helped and he would like to have some more of that.  He complains of left-sided abdominal pain, but says he has not had a good bowel movement in at least a week and a half.  He does complain of reflux and burning as well.  EGD 12/2021:  - LA Grade B reflux esophagitis with no bleeding. - Gastritis. Biopsied. - Normal examined duodenum.  Colonoscopy 12/2021:  - The examined portion of the ileum was normal. - Erythematous mucosa in the entire examined colon. Biopsied. - Non-bleeding external and internal hemorrhoids.  1. Surgical [P], gastric antrum and gastric body biopsy - GASTRIC ANTRAL AND OXYNTIC MUCOSA WITH MILD NONSPECIFIC REACTIVE GASTROPATHY - HELICOBACTER PYLORI-LIKE ORGANISMS ARE NOT IDENTIFIED ON ROUTINE H&E STAIN 2. Surgical [P], random colon biopsy sites - COLONIC MUCOSA WITH NO SPECIFIC HISTOPATHOLOGIC CHANGES - NEGATIVE FOR ACUTE INFLAMMATION, FEATURES OF CHRONICITY, GRANULOMAS OR DYSPLASIA   Past Medical History:  Diagnosis Date   Bronchitis    Multiple gastric ulcers    Past Surgical History:  Procedure Laterality Date   COLONOSCOPY     I & D EXTREMITY  10/18/2011   Procedure: IRRIGATION AND DEBRIDEMENT EXTREMITY;  Surgeon: Dominica Severin, MD;  Location: [facility] OR;  Service:  Orthopedics;  Laterality: Right;  Irrigation and Debridement  Right Middle Finger; Removal of foreign body    reports that he has been smoking cigarettes. He has been smoking an average of 1 pack per day. He has never used smokeless tobacco. He reports that he does not currently use alcohol. He reports current drug use. Frequency: 3.00 times per week. Drug: Marijuana. family history includes Brain cancer in his maternal uncle; Colon cancer in his maternal uncle; Healthy in his mother; Heart disease in his maternal aunt; Hypertension in his father; Rectal cancer in his maternal uncle. Allergies  Allergen Reactions   Aspirin     Cannot take due to stomach ulcers      Outpatient Encounter Medications as of 04/23/2022  Medication Sig   pantoprazole (PROTONIX) 40 MG tablet Take 1 tablet (40 mg total) by mouth daily.   [DISCONTINUED] acetaminophen (TYLENOL) 325 MG tablet Take 650 mg by mouth every 6 (six) hours as needed for mild pain, fever or headache. (Patient not taking: Reported on 04/17/2022)   [DISCONTINUED] HYDROcodone-acetaminophen (NORCO/VICODIN) 5-325 MG tablet Take 1 tablet by mouth every 4 (four) hours as needed. (Patient not taking: Reported on 12/11/2021)   [DISCONTINUED] ondansetron (ZOFRAN-ODT) 8 MG disintegrating tablet Take 1 tablet (8 mg total) by mouth every 8 (eight) hours as needed for nausea or vomiting. (Patient not taking: Reported on 04/17/2022)   [DISCONTINUED] sucralfate (CARAFATE) 1 g tablet Take 1 tablet (1 g total) by mouth 4 (four) times daily -  with meals and at bedtime. (Patient not taking: Reported on 01/07/2022)   No facility-administered encounter medications on file as of 04/23/2022.     REVIEW OF SYSTEMS  : All other systems reviewed and negative except where noted in the History of Present Illness.   PHYSICAL EXAM: BP 120/72 (BP Location: Left Arm, Patient Position: Sitting, Cuff Size: Normal)   Pulse 81   Ht 5\' 8"  (1.727 m)   Wt 130 lb 12.8 oz (59.3 kg)    SpO2 99%   BMI 19.89 kg/m  General: Well developed AA male in no acute distress Head: Normocephalic and atraumatic Eyes:  Sclerae anicteric, conjunctiva pink. Ears: Normal auditory acuity Lungs: Clear throughout to auscultation; no W/R/R. Heart: Regular rate and rhythm; no M/R/R Abdomen: Soft, non-distended.  BS present.  Left sided TTP. Musculoskeletal: Symmetrical with no gross deformities  Skin: No lesions on visible extremities Extremities: No edema  Neurological: Alert oriented x 4, grossly non-focal Psychological:  Alert and cooperative. Normal mood and affect  ASSESSMENT AND PLAN: *GERD, gastritis, history of gastric ulcers, Grade B esophagitis, upper GI symptoms possibly related to cannabis use:  Has been on pantoprazole 40 mg daily.  His PCP recently increased it to twice daily.  Will have him do twice daily for about a month and then decrease back to once a day.  New prescription sent to pharmacy. *Generalized abdominal pain with constipation and previous abnormal colonoscopy.  Colonoscopy in 04/2019 showed friability of the terminal ileum and mucosal ulcerations.  Biopsy showed edema, but nothing consistent with IBD.  Repeat colonoscopy in August unremarkable with unremarkable biopsies.  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  He had a great response to Linzess 140 mcg daily.  He had only been given samples.  We will give more samples today and send prescription to his pharmacy.  **He will follow-up with me in 4 to 6 weeks.  CC:  September,*

## 2022-05-06 ENCOUNTER — Encounter: Payer: Self-pay | Admitting: Family

## 2022-05-13 ENCOUNTER — Telehealth: Payer: Self-pay | Admitting: Family

## 2022-05-13 NOTE — Telephone Encounter (Signed)
Please make copy of FMLA to be scanned in; okay to put up front and does need to be charged; I let him know that it was ready for pick up with MyChart message.

## 2022-05-14 NOTE — Telephone Encounter (Signed)
Charge form has been submitted and FMLA form is up front for Pt to pick up.

## 2022-05-18 ENCOUNTER — Encounter (HOSPITAL_BASED_OUTPATIENT_CLINIC_OR_DEPARTMENT_OTHER): Payer: Self-pay | Admitting: Emergency Medicine

## 2022-05-18 ENCOUNTER — Emergency Department (HOSPITAL_BASED_OUTPATIENT_CLINIC_OR_DEPARTMENT_OTHER)
Admission: EM | Admit: 2022-05-18 | Discharge: 2022-05-18 | Disposition: A | Discharge status: Home / Self Care | Payer: 59 | Attending: Emergency Medicine | Admitting: Emergency Medicine

## 2022-05-18 ENCOUNTER — Other Ambulatory Visit: Payer: Self-pay

## 2022-05-18 ENCOUNTER — Emergency Department (HOSPITAL_BASED_OUTPATIENT_CLINIC_OR_DEPARTMENT_OTHER): Payer: 59

## 2022-05-18 DIAGNOSIS — R1030 Lower abdominal pain, unspecified: Secondary | ICD-10-CM

## 2022-05-18 DIAGNOSIS — R825 Elevated urine levels of drugs, medicaments and biological substances: Secondary | ICD-10-CM | POA: Insufficient documentation

## 2022-05-18 DIAGNOSIS — R1084 Generalized abdominal pain: Secondary | ICD-10-CM | POA: Insufficient documentation

## 2022-05-18 DIAGNOSIS — D72829 Elevated white blood cell count, unspecified: Secondary | ICD-10-CM | POA: Insufficient documentation

## 2022-05-18 DIAGNOSIS — R112 Nausea with vomiting, unspecified: Secondary | ICD-10-CM

## 2022-05-18 LAB — URINALYSIS, ROUTINE W REFLEX MICROSCOPIC
Bacteria, UA: NONE SEEN
Bilirubin Urine: NEGATIVE
Glucose, UA: NEGATIVE mg/dL
Hgb urine dipstick: NEGATIVE
Ketones, ur: 15 mg/dL — AB
Leukocytes,Ua: NEGATIVE
Nitrite: NEGATIVE
Protein, ur: 100 mg/dL — AB
Specific Gravity, Urine: 1.029 (ref 1.005–1.030)
pH: 6.5 (ref 5.0–8.0)

## 2022-05-18 LAB — RAPID URINE DRUG SCREEN, HOSP PERFORMED
Amphetamines: NOT DETECTED
Barbiturates: NOT DETECTED
Benzodiazepines: NOT DETECTED
Cocaine: NOT DETECTED
Opiates: POSITIVE — AB
Tetrahydrocannabinol: POSITIVE — AB

## 2022-05-18 LAB — COMPREHENSIVE METABOLIC PANEL
ALT: 12 U/L (ref 0–44)
AST: 12 U/L — ABNORMAL LOW (ref 15–41)
Albumin: 5 g/dL (ref 3.5–5.0)
Alkaline Phosphatase: 82 U/L (ref 38–126)
Anion gap: 16 — ABNORMAL HIGH (ref 5–15)
BUN: 12 mg/dL (ref 6–20)
CO2: 28 mmol/L (ref 22–32)
Calcium: 10.8 mg/dL — ABNORMAL HIGH (ref 8.9–10.3)
Chloride: 95 mmol/L — ABNORMAL LOW (ref 98–111)
Creatinine, Ser: 0.79 mg/dL (ref 0.61–1.24)
GFR, Estimated: >60 mL/min (ref >60)
Glucose, Bld: 106 mg/dL — ABNORMAL HIGH (ref 70–99)
Potassium: 4 mmol/L (ref 3.5–5.1)
Sodium: 139 mmol/L (ref 135–145)
Total Bilirubin: 0.9 mg/dL (ref 0.3–1.2)
Total Protein: 8.8 g/dL — ABNORMAL HIGH (ref 6.5–8.1)

## 2022-05-18 LAB — CBC WITH DIFFERENTIAL/PLATELET
Abs Immature Granulocytes: 0.04 10*3/uL (ref 0.00–0.07)
Basophils Absolute: 0 10*3/uL (ref 0.0–0.1)
Basophils Relative: 0 %
Eosinophils Absolute: 0 10*3/uL (ref 0.0–0.5)
Eosinophils Relative: 0 %
HCT: 39.1 % (ref 39.0–52.0)
Hemoglobin: 13.8 g/dL (ref 13.0–17.0)
Immature Granulocytes: 0 %
Lymphocytes Relative: 14 %
Lymphs Abs: 1.5 10*3/uL (ref 0.7–4.0)
MCH: 30.1 pg (ref 26.0–34.0)
MCHC: 35.3 g/dL (ref 30.0–36.0)
MCV: 85.4 fL (ref 80.0–100.0)
Monocytes Absolute: 1.1 10*3/uL — ABNORMAL HIGH (ref 0.1–1.0)
Monocytes Relative: 10 %
Neutro Abs: 8.2 10*3/uL — ABNORMAL HIGH (ref 1.7–7.7)
Neutrophils Relative %: 76 %
Platelets: 467 10*3/uL — ABNORMAL HIGH (ref 150–400)
RBC: 4.58 MIL/uL (ref 4.22–5.81)
RDW: 13.5 % (ref 11.5–15.5)
WBC: 10.9 10*3/uL — ABNORMAL HIGH (ref 4.0–10.5)
nRBC: 0 % (ref 0.0–0.2)

## 2022-05-18 LAB — LIPASE, BLOOD: Lipase: <10 U/L — ABNORMAL LOW (ref 11–51)

## 2022-05-18 MED ORDER — HYDROMORPHONE HCL 1 MG/ML IJ SOLN
0.5000 mg | Freq: Once | INTRAMUSCULAR | Status: AC
  Administered 2022-05-18: 0.5 mg via INTRAVENOUS
  Filled 2022-05-18: qty 1

## 2022-05-18 MED ORDER — SODIUM CHLORIDE 0.9 % IV BOLUS
1000.0000 mL | Freq: Once | INTRAVENOUS | Status: AC
  Administered 2022-05-18: 1000 mL via INTRAVENOUS

## 2022-05-18 MED ORDER — PROMETHAZINE HCL 25 MG PO TABS: 25.0000 mg | ORAL_TABLET | Freq: Four times a day (QID) | ORAL | 0 refills | Status: DC | PRN

## 2022-05-18 MED ORDER — IOHEXOL 300 MG/ML  SOLN
100.0000 mL | Freq: Once | INTRAMUSCULAR | Status: AC | PRN
  Administered 2022-05-18: 100 mL via INTRAVENOUS

## 2022-05-18 MED ORDER — OXYCODONE-ACETAMINOPHEN 5-325 MG PO TABS: 1.0000 | ORAL_TABLET | Freq: Four times a day (QID) | ORAL | 0 refills | Status: DC | PRN

## 2022-05-18 MED ORDER — ONDANSETRON HCL 4 MG/2ML IJ SOLN
4.0000 mg | Freq: Once | INTRAMUSCULAR | Status: AC
  Administered 2022-05-18: 4 mg via INTRAVENOUS
  Filled 2022-05-18: qty 2

## 2022-05-18 NOTE — ED Notes (Signed)
Discharge paperwork given and verbally understood. 

## 2022-05-18 NOTE — Discharge Instructions (Addendum)
Please take your medications as prescribed. Please take tylenol/ibuprofen  or Percocet as needed for pain. I recommend close follow-up with gastroenterology for reevaluation.  Please do not hesitate to return to emergency department if worrisome signs symptoms we discussed become apparent.

## 2022-05-18 NOTE — ED Notes (Signed)
Provider requested a PO challenge... Pt passed, no N/V after challenge.

## 2022-05-18 NOTE — ED Provider Notes (Signed)
37 yo male presents co nausea vomiting with hematemesis multiple episodes.  Continues to have lower abdominal pain chronic for 3 years since ulcers found on upper endoscopy.   BP elevated at 141/107 HR 96 Patient following with GI at [facility] Patient smoke a blunt a day.  States it does not impact his symptoms PO intake increases symptoms    Pattricia Boss, MD 05/20/22 1152

## 2022-05-18 NOTE — ED Provider Notes (Signed)
[facility] EMERGENCY DEPT Provider Note   CSN: [id number] Arrival date & time: 05/18/22  1000     History {Add pertinent medical, surgical, social history, OB history to HPI:1} No chief complaint on file.   Gabriel Dalton is a 37 y.o. male with a past medical history of bronchitis presenting to the emergency room for evaluation of nausea and vomiting.  Patient reports that he has been having worsening nausea and vomiting since last night.  He noticed some blood in his emesis.  Patient also reports diffuse abdominal pain.  He reports that he has chronic constipation which he took Linzess with no improvement.  He reports last bowel movement was 3 days ago.  No fever, chest pain, shortness of breath.  Admits to smoking about 1 pack today.  Patient reports marijuana use but has not used recently.  HPI    Past Medical History:  Diagnosis Date   Bronchitis    Multiple gastric ulcers    Past Surgical History:  Procedure Laterality Date   COLONOSCOPY     I & D EXTREMITY  10/18/2011   Procedure: IRRIGATION AND DEBRIDEMENT EXTREMITY;  Surgeon: Dominica Severin, MD;  Location: [facility] OR;  Service: Orthopedics;  Laterality: Right;  Irrigation and Debridement  Right Middle Finger; Removal of foreign body     Home Medications Prior to Admission medications   Medication Sig Start Date End Date Taking? Authorizing Provider  oxyCODONE-acetaminophen (PERCOCET/ROXICET) 5-325 MG tablet Take 1 tablet by mouth every 6 (six) hours as needed for severe pain. 05/18/22  Yes Jeanelle Malling, PA  promethazine (PHENERGAN) 25 MG tablet Take 1 tablet (25 mg total) by mouth every 6 (six) hours as needed for nausea or vomiting. 05/18/22  Yes Jeanelle Malling, PA  linaclotide [facility]) 145 MCG CAPS capsule Take 1 capsule (145 mcg total) by mouth daily before breakfast. 04/23/22   Zehr, Princella Pellegrini, PA-C  pantoprazole (PROTONIX) 40 MG tablet Take 1 tablet (40 mg total) by mouth 2 (two) times daily. 04/23/22   Zehr,  Princella Pellegrini, PA-C      Allergies    Aspirin    Review of Systems   Review of Systems  Gastrointestinal:  Positive for nausea and vomiting.    Physical Exam Updated Vital Signs BP (!) 141/91 (BP Location: Right Arm)   Pulse 61   Temp 98.4 F (36.9 C) (Oral)   Resp 16   SpO2 100%  Physical Exam Vitals and nursing note reviewed.  Constitutional:      Appearance: Normal appearance.  HENT:     Head: Normocephalic and atraumatic.     Mouth/Throat:     Mouth: Mucous membranes are moist.  Eyes:     General: No scleral icterus. Cardiovascular:     Rate and Rhythm: Normal rate and regular rhythm.     Pulses: Normal pulses.     Heart sounds: Normal heart sounds.  Pulmonary:     Effort: Pulmonary effort is normal.     Breath sounds: Normal breath sounds.  Abdominal:     General: Abdomen is flat.     Palpations: Abdomen is soft.     Tenderness: There is no abdominal tenderness.  Musculoskeletal:        General: No deformity.  Skin:    General: Skin is warm.     Findings: No rash.  Neurological:     General: No focal deficit present.     Mental Status: He is alert.  Psychiatric:  Mood and Affect: Mood normal.     ED Results / Procedures / Treatments   Labs (all labs ordered are listed, but only abnormal results are displayed) Labs Reviewed  CBC WITH DIFFERENTIAL/PLATELET - Abnormal; Notable for the following components:      Result Value   WBC 10.9 (*)    Platelets 467 (*)    Neutro Abs 8.2 (*)    Monocytes Absolute 1.1 (*)    All other components within normal limits  COMPREHENSIVE METABOLIC PANEL - Abnormal; Notable for the following components:   Chloride 95 (*)    Glucose, Bld 106 (*)    Calcium 10.8 (*)    Total Protein 8.8 (*)    AST 12 (*)    Anion gap 16 (*)    All other components within normal limits  LIPASE, BLOOD - Abnormal; Notable for the following components:   Lipase <10 (*)    All other components within normal limits  URINALYSIS,  ROUTINE W REFLEX MICROSCOPIC - Abnormal; Notable for the following components:   Ketones, ur 15 (*)    Protein, ur 100 (*)    All other components within normal limits  RAPID URINE DRUG SCREEN, HOSP PERFORMED - Abnormal; Notable for the following components:   Opiates POSITIVE (*)    Tetrahydrocannabinol POSITIVE (*)    All other components within normal limits    EKG None  Radiology CT ABDOMEN PELVIS W CONTRAST  Result Date: 05/18/2022 CLINICAL DATA:  Acute abdominal pain. Nausea and vomiting. Patient reports bloody emesis. EXAM: CT ABDOMEN AND PELVIS WITH CONTRAST TECHNIQUE: Multidetector CT imaging of the abdomen and pelvis was performed using the standard protocol following bolus administration of intravenous contrast. RADIATION DOSE REDUCTION: This exam was performed according to the departmental dose-optimization program which includes automated exposure control, adjustment of the mA and/or kV according to patient size and/or use of iterative reconstruction technique. CONTRAST:  177mL OMNIPAQUE IOHEXOL 300 MG/ML  SOLN COMPARISON:  11/22/2021 FINDINGS: Lower chest: No acute abnormality. Hepatobiliary: No focal liver abnormality is seen. No gallstones, gallbladder wall thickening, or biliary dilatation. Pancreas: Unremarkable. No pancreatic ductal dilatation or surrounding inflammatory changes. Spleen: Normal in size without focal abnormality. Adrenals/Urinary Tract: No suspicious kidney mass, nephrolithiasis, or hydronephrosis. Urinary bladder is unremarkable. Stomach/Bowel: Stomach appears normal. The appendix is not confidently visualized separate from the right lower quadrant bowel loops. No signs of pericecal inflammation. No bowel wall thickening, inflammation, or distension identified. Vascular/Lymphatic: Aortic atherosclerosis. No enlarged abdominal or pelvic lymph nodes. Reproductive: Prostate is unremarkable. Other: No abdominal wall hernia or abnormality. No abdominopelvic ascites.  Musculoskeletal: No acute or significant osseous findings. IMPRESSION: 1. No acute findings within the abdomen or pelvis. 2.  Aortic Atherosclerosis (ICD10-I70.0). Electronically Signed   By: Kerby Moors M.D.   On: 05/18/2022 13:05    Procedures Procedures  {Document cardiac monitor, telemetry assessment procedure when appropriate:1}  Medications Ordered in ED Medications  sodium chloride 0.9 % bolus 1,000 mL (0 mLs Intravenous Stopped 05/18/22 1217)  ondansetron (ZOFRAN) injection 4 mg (4 mg Intravenous Given 05/18/22 1106)  HYDROmorphone (DILAUDID) injection 0.5 mg (0.5 mg Intravenous Given 05/18/22 1107)  iohexol (OMNIPAQUE) 300 MG/ML solution 100 mL (100 mLs Intravenous Contrast Given 05/18/22 1235)    ED Course/ Medical Decision Making/ A&P                           Medical Decision Making Amount and/or Complexity of Data Reviewed Labs: ordered.  Radiology: ordered.  Risk Prescription drug management.   This patient presents to the ED for abdominal pain, nausea and vomiting, this involves an extensive number of treatment options, and is a complaint that carries with a high risk of complications and morbidity.  The differential diagnosis includes diverticulitis, hernia, diverticulitis, UTI, constipation, testicular torsion, orchitis epididymitis. This is not an exhaustive list.  Lab tests: I ordered and personally interpreted labs.  The pertinent results include: WBC 10.9. Hbg unremarkable. Platelets unremarkable. No electrolyte abnormalities noted.  BUN, creatinine unremarkable. UA significant for no acute abnormality.  Rapid urine drug screen positive for opiates and tetrahydrocannabinol.  Imaging studies: I ordered imaging studies. I personally reviewed, interpreted imaging and agree with the radiologist's interpretations. The results include: CT abdomen pelvis with no acute findings.  Problem list/ ED course/ Critical interventions/ Medical management: HPI: See above Vital  signs within normal range and stable throughout visit. Laboratory/imaging studies significant for: See above. On physical examination, patient is afebrile and appears in no acute distress. Abdominal exam without peritoneal signs. No evidence of acute abdomen at this time. Well appearing. Given work up, low suspicion for acute hepatobiliary disease (including acute cholecystitis or cholangitis), acute pancreatitis (neg lipase), PUD (including gastric perforation), acute infectious processes (pneumonia, hepatitis, pyelonephritis), acute appendicitis, vascular catastrophe, bowel obstruction, viscus perforation, or testicular torsion, diverticulitis. Presentation not consistent with other acute, emergent causes of abdominal pain at this time.  Dilaudid ordered for pain, Zofran ordered for nausea. Reevaluation of the patient after these medications showed that the patient improved.  Patient is able to tolerate p.o. at this point.  Advised patient to follow-up with GI for further evaluation management.  Return to the ER if new or worsening symptoms. I have reviewed the patient home medicines and have made adjustments as needed.  Cardiac monitoring/EKG: The patient was maintained on a cardiac monitor.  I personally reviewed and interpreted the cardiac monitor which showed an underlying rhythm of: sinus rhythm.  Additional history obtained: External records from outside source obtained and reviewed including: Chart review including previous notes, labs, imaging.  Consultations obtained:  Disposition Continued outpatient therapy. Follow-up with GI recommended for reevaluation of symptoms. Treatment plan discussed with patient.  Pt acknowledged understanding was agreeable to the plan. Worrisome signs and symptoms were discussed with patient, and patient acknowledged understanding to return to the ED if they noticed these signs and symptoms. Patient was stable upon discharge.   This chart was dictated using  voice recognition software.  Despite best efforts to proofread,  errors can occur which can change the documentation meaning.    {Document critical care time when appropriate:1} {Document review of labs and clinical decision tools ie heart score, Chads2Vasc2 etc:1}  {Document your independent review of radiology images, and any outside records:1} {Document your discussion with family members, caretakers, and with consultants:1} {Document social determinants of health affecting pt's care:1} {Document your decision making why or why not admission, treatments were needed:1} Final Clinical Impression(s) / ED Diagnoses Final diagnoses:  Nausea and vomiting, unspecified vomiting type  Lower abdominal pain    Rx / DC Orders ED Discharge Orders          Ordered    promethazine (PHENERGAN) 25 MG tablet  Every 6 hours PRN        05/18/22 1410    oxyCODONE-acetaminophen (PERCOCET/ROXICET) 5-325 MG tablet  Every 6 hours PRN        05/18/22 1410

## 2022-05-18 NOTE — ED Triage Notes (Signed)
Vomiting started yesterday, nauseated, legs are numb from his diabetes, says he has seen blood in his emesis.

## 2022-05-18 NOTE — ED Notes (Signed)
Pt aware of the need for a urine. Currently unable to urinate.

## 2022-05-20 ENCOUNTER — Emergency Department (HOSPITAL_COMMUNITY): Payer: 59

## 2022-05-20 ENCOUNTER — Encounter (HOSPITAL_COMMUNITY): Payer: Self-pay

## 2022-05-20 ENCOUNTER — Other Ambulatory Visit: Payer: Self-pay

## 2022-05-20 ENCOUNTER — Emergency Department (HOSPITAL_COMMUNITY)
Admission: EM | Admit: 2022-05-20 | Discharge: 2022-05-20 | Disposition: A | Discharge status: Home / Self Care | Payer: 59 | Attending: Emergency Medicine | Admitting: Emergency Medicine

## 2022-05-20 DIAGNOSIS — R1084 Generalized abdominal pain: Secondary | ICD-10-CM

## 2022-05-20 DIAGNOSIS — F17210 Nicotine dependence, cigarettes, uncomplicated: Secondary | ICD-10-CM | POA: Diagnosis not present

## 2022-05-20 DIAGNOSIS — D72829 Elevated white blood cell count, unspecified: Secondary | ICD-10-CM | POA: Diagnosis not present

## 2022-05-20 DIAGNOSIS — R1030 Lower abdominal pain, unspecified: Secondary | ICD-10-CM | POA: Diagnosis present

## 2022-05-20 LAB — CBC
HCT: 39.7 % (ref 39.0–52.0)
Hemoglobin: 13.5 g/dL (ref 13.0–17.0)
MCH: 29.8 pg (ref 26.0–34.0)
MCHC: 34 g/dL (ref 30.0–36.0)
MCV: 87.6 fL (ref 80.0–100.0)
Platelets: 467 10*3/uL — ABNORMAL HIGH (ref 150–400)
RBC: 4.53 MIL/uL (ref 4.22–5.81)
RDW: 13.8 % (ref 11.5–15.5)
WBC: 17.3 10*3/uL — ABNORMAL HIGH (ref 4.0–10.5)
nRBC: 0 % (ref 0.0–0.2)

## 2022-05-20 LAB — COMPREHENSIVE METABOLIC PANEL
ALT: 13 U/L (ref 0–44)
AST: 14 U/L — ABNORMAL LOW (ref 15–41)
Albumin: 3.8 g/dL (ref 3.5–5.0)
Alkaline Phosphatase: 71 U/L (ref 38–126)
Anion gap: 11 (ref 5–15)
BUN: 19 mg/dL (ref 6–20)
CO2: 25 mmol/L (ref 22–32)
Calcium: 9.4 mg/dL (ref 8.9–10.3)
Chloride: 97 mmol/L — ABNORMAL LOW (ref 98–111)
Creatinine, Ser: 0.86 mg/dL (ref 0.61–1.24)
GFR, Estimated: >60 mL/min (ref >60)
Glucose, Bld: 107 mg/dL — ABNORMAL HIGH (ref 70–99)
Potassium: 3.6 mmol/L (ref 3.5–5.1)
Sodium: 133 mmol/L — ABNORMAL LOW (ref 135–145)
Total Bilirubin: 1 mg/dL (ref 0.3–1.2)
Total Protein: 7.9 g/dL (ref 6.5–8.1)

## 2022-05-20 LAB — LACTIC ACID, PLASMA: Lactic Acid, Venous: 1.5 mmol/L (ref 0.5–1.9)

## 2022-05-20 LAB — LIPASE, BLOOD: Lipase: 28 U/L (ref 11–51)

## 2022-05-20 MED ORDER — SODIUM CHLORIDE 0.9 % IV BOLUS
1000.0000 mL | Freq: Once | INTRAVENOUS | Status: AC
  Administered 2022-05-20: 1000 mL via INTRAVENOUS

## 2022-05-20 MED ORDER — SODIUM CHLORIDE (PF) 0.9 % IJ SOLN
INTRAMUSCULAR | Status: AC
  Filled 2022-05-20: qty 50

## 2022-05-20 MED ORDER — IOHEXOL 300 MG/ML  SOLN
100.0000 mL | Freq: Once | INTRAMUSCULAR | Status: AC | PRN
  Administered 2022-05-20: 100 mL via INTRAVENOUS

## 2022-05-20 MED ORDER — IOHEXOL 9 MG/ML PO SOLN
500.0000 mL | ORAL | Status: AC
  Administered 2022-05-20 (×2): 500 mL via ORAL

## 2022-05-20 MED ORDER — IOHEXOL 9 MG/ML PO SOLN
ORAL | Status: AC
  Filled 2022-05-20: qty 1000

## 2022-05-20 MED ORDER — LIDOCAINE VISCOUS HCL 2 % MT SOLN
15.0000 mL | Freq: Once | OROMUCOSAL | Status: AC
  Administered 2022-05-20: 15 mL via OROMUCOSAL
  Filled 2022-05-20: qty 15

## 2022-05-20 MED ORDER — MORPHINE SULFATE (PF) 4 MG/ML IV SOLN
4.0000 mg | Freq: Once | INTRAVENOUS | Status: AC
  Administered 2022-05-20: 4 mg via INTRAVENOUS
  Filled 2022-05-20: qty 1

## 2022-05-20 NOTE — Discharge Instructions (Signed)
Please follow-up with GI and please do those lifestyle changes we discussed, that includes sleeping with the head of the bed elevated, avoiding any foods that may trigger the symptoms, smoking.  Please continue taking Protonix as prescribed. If symptoms worsen please return to ER for further evaluation.

## 2022-05-20 NOTE — ED Provider Notes (Addendum)
[place] DEPT Provider Note   CSN: [id number] Arrival date & time: 05/20/22  0623     History  Chief Complaint  Patient presents with   Abdominal Pain    Gabriel Dalton is a 36 y.o. male, h/o gastric ulcer, esophagitis, abnormal colonoscopy, for lower abdominal pain.  He stated he has had lower abdominal pain for the past 3 years and has been seeing GI.  Patient was seen on 05/18/2022 discharged with Phenergan and Percocet and was referred to be evaluated by [facility].  Patient stated he has not made an appointment with [facility].  Today he has not been able to eat food/drink fluids since last Saturday.  Patient has endorsed hematemesis and feeling weak.  Lower abdominal pain is 10/10 sharp episodic pain that is exacerbated with palpation and eating food but will also occur without those triggers.  Pain radiates from the lower abdominal region to his rectum.  He stated on the last colonoscopy he had friability in the terminal ileum and mucosal ulcerations but test did not suggest IBD.  Patient was to get a repeat colonoscopy.  Stated he has not used marijuana in over a week and usually smokes 1 ppd but has not had the energy to smoke for a week.  Denied chest pain, shortness of breath, diarrhea, syncope, changes in sensation/motor skills, dysuria, hematuria, hematochezia.  Home Medications Prior to Admission medications   Medication Sig Start Date End Date Taking? Authorizing Provider  linaclotide [facility]) 145 MCG CAPS capsule Take 1 capsule (145 mcg total) by mouth daily before breakfast. 04/23/22   Zehr, Laban Emperor, PA-C  oxyCODONE-acetaminophen (PERCOCET/ROXICET) 5-325 MG tablet Take 1 tablet by mouth every 6 (six) hours as needed for severe pain. 05/18/22   Rex Kras, PA  pantoprazole (PROTONIX) 40 MG tablet Take 1 tablet (40 mg total) by mouth 2 (two) times daily. 04/23/22   Zehr, Laban Emperor, PA-C  promethazine (PHENERGAN) 25 MG tablet Take 1 tablet  (25 mg total) by mouth every 6 (six) hours as needed for nausea or vomiting. 05/18/22   Rex Kras, PA      Allergies    Aspirin    Review of Systems   Review of Systems  Gastrointestinal:  Positive for abdominal pain.  See HPI  Physical Exam Updated Vital Signs BP (!) 151/89   Pulse 85   Temp 97.6 F (36.4 C)   Resp 16   SpO2 99%  Physical Exam Vitals and nursing note reviewed.  Constitutional:      General: He is in acute distress.     Appearance: He is well-developed.  HENT:     Head: Normocephalic and atraumatic.  Eyes:     Conjunctiva/sclera: Conjunctivae normal.  Cardiovascular:     Rate and Rhythm: Normal rate and regular rhythm.     Heart sounds: Normal heart sounds.  Pulmonary:     Effort: Pulmonary effort is normal.     Breath sounds: Normal breath sounds.  Abdominal:     Palpations: Abdomen is soft.     Tenderness: There is no abdominal tenderness.     Comments: First evaluation: Patient was too tender to allow for thorough examination and exhibited guarding and rebound tenderness throughout the entire abdomen.   Musculoskeletal:        General: No swelling.     Cervical back: Neck supple.  Skin:    General: Skin is warm and dry.     Capillary Refill: Capillary refill takes less  than 2 seconds.  Neurological:     Mental Status: He is alert.  Psychiatric:        Mood and Affect: Mood normal.     ED Results / Procedures / Treatments   Labs (all labs ordered are listed, but only abnormal results are displayed) Labs Reviewed  COMPREHENSIVE METABOLIC PANEL - Abnormal; Notable for the following components:      Result Value   Sodium 133 (*)    Chloride 97 (*)    Glucose, Bld 107 (*)    AST 14 (*)    All other components within normal limits  CBC - Abnormal; Notable for the following components:   WBC 17.3 (*)    Platelets 467 (*)    All other components within normal limits  LIPASE, BLOOD  LACTIC ACID, PLASMA  URINALYSIS, ROUTINE W REFLEX  MICROSCOPIC  CBC WITH DIFFERENTIAL/PLATELET    EKG None  Radiology CT ABDOMEN PELVIS W CONTRAST  Result Date: 05/20/2022 CLINICAL DATA:  Abdominal pain, acute, nonlocalized intractable abdominal pain. Weight loss. Nausea. EXAM: CT ABDOMEN AND PELVIS WITH CONTRAST TECHNIQUE: Multidetector CT imaging of the abdomen and pelvis was performed using the standard protocol following bolus administration of intravenous contrast. RADIATION DOSE REDUCTION: This exam was performed according to the departmental dose-optimization program which includes automated exposure control, adjustment of the mA and/or kV according to patient size and/or use of iterative reconstruction technique. CONTRAST:  OMNIPAQUE IOHEXOL 300 MG/ML  SOLN COMPARISON:  05/18/2022.  11/22/2021.  06/10/2021.  MRI 11/24/2020. FINDINGS: Lower chest: Lung bases are clear. Hepatobiliary: Liver parenchyma is normal. Question sludge and or small stones dependent within the gallbladder. No ductal dilatation. Pancreas: Pancreatic divisum as seen previously. No focal finding otherwise. Spleen: Normal Adrenals/Urinary Tract: Adrenal glands are normal. Kidneys are normal. Bladder is normal. Stomach/Bowel: Stomach and small intestine are normal. No abnormal colon finding. Vascular/Lymphatic: Aortic atherosclerosis, premature for age. IVC is normal. No adenopathy. Reproductive: Normal Other: No free fluid or air. Musculoskeletal: Normal IMPRESSION: 1. Question sludge and/or small stones dependent within the gallbladder. No CT evidence of acute inflammation or biliary obstruction however. 2. No other acute finding to explain the clinical presentation. 3. Pancreatic divisum as seen previously. 4. Aortic atherosclerosis, premature for age. Aortic Atherosclerosis (ICD10-I70.0). Electronically Signed   By: Paulina Fusi M.D.   On: 05/20/2022 12:05    Procedures Procedures   Medications Ordered in ED Medications  iohexol (OMNIPAQUE) 9 MG/ML oral solution  (has no administration in time range)  sodium chloride (PF) 0.9 % injection (has no administration in time range)  sodium chloride 0.9 % bolus 1,000 mL (0 mLs Intravenous Stopped 05/20/22 1014)  morphine (PF) 4 MG/ML injection 4 mg (4 mg Intravenous Given 05/20/22 0800)  lidocaine (XYLOCAINE) 2 % viscous mouth solution 15 mL (15 mLs Mouth/Throat Given 05/20/22 0918)  iohexol (OMNIPAQUE) 9 MG/ML oral solution 500 mL (500 mLs Oral Contrast Given 05/20/22 1015)  iohexol (OMNIPAQUE) 300 MG/ML solution 100 mL (100 mLs Intravenous Contrast Given 05/20/22 1141)    ED Course/ Medical Decision Making/ A&P                           Medical Decision Making Amount and/or Complexity of Data Reviewed Labs: ordered. Radiology: ordered.  Risk Prescription drug management.   Gabriel Dalton 37 y.o. presented today for abdominal pain with nausea vomiting. Working DDx that I considered at this time includes, but not limited to, for to  gastric ulcer, testicular torsion, acid reflux, IBD/IBS, perforated bowel, mesenteric ischemia, viral illness, constipation, diverticulitis, hernia, UTI, orchitis/epididymitis, Pancreatitis.  Review of prior external notes: 05/18/22 ED Provider Notes Jeanelle Malling, PA-C  Unique Tests and My Interpretation: CMP: Unremarkable CBC: leukocytosis 17.3 Lactic acid: 1.5, second  lactic acid was d/c due to not elevated first result UA: was not collected CT Abd Pelvis w Oral Contrast: questionable biliary sludge with small gallstones; no acute findings; I agree with radiologist read  Discussion with Independent Historian: Girlfriend  Discussion of Management of Tests: none  Risk: Low:  - based on diagnostic testing/clinical impression and treatment plan  Risk Stratification Score: None  Staffed with Army Melia, PA-C  R/o DDx: Diverticulitis: CT from 05/18/2022 did not show any diverticuli or diverticulitis Hernia: No hernia was palpated on exam Constipation: Last CT did not  show any signs of constipation/stool in the bowels and patient has not been eating food to suggest this is the cause; CT was also negative Viral illness: Respiratory panel on 05/18/2022 was negative, unlikely this is changed as patient has not endorsed any changes in symptoms Pancreatitis: lipase was not elevated Orchitis/epididymitis/testicular torsion: Has been ongoing for 3 years and patient denied any testicular pain/groin pain or genital changes to suggest this is the cause Hernia: CT was negative for a hernia Perforated Bowel: CT was negative perforated bowel   Plan: Patient had an extensive workup on 05/18/2022 that was largely unremarkable and was discharged with follow-up, pain meds, Phenergan for symptoms.  Patient has denied any new triggers or symptoms since last visit but does have an elevated WBC from last time so we will get a lactic acid looking for any mesenteric ischemia as patient has pain out of proportion on exam with guarding and rebound tenderness.  Vitals are stable at this time.  Will reevaluate once patient has received fluids and pain meds.  Patient still endorses pain after morphine.  Pain is now 7/10 and patient states pain has been "dulled."  Given patient's history of CT exams we decided to pursue shared decision making as patient' symptoms are persistent and the previous CT were done with IV contrast as opposed to oral. After some discussion, patient agreed to CT abd pelvis with oral contrast to see if there are any new findings as patient continues to appear uncomfortable. Patient will be given viscous lidocaine before he was given the oral contrast to help the stomach and make the oral contrast more bearable.  Patient was agreeable to plan.  On recheck patient stated pain had decreased to 2/10.  CT was reassuring for any acute changes.  With all reassuring labs and reassuring CT along with the pain started to subside long with stable vitals patient was able to be  discharged.  Patient was educated on making appointment with GI as soon as possible for further evaluation, possible endoscopy or colonoscopy.  I suspect since the patient's pain got better after the viscous lidocaine the patient's symptoms are related to his gastric ulcers.  Patient was able to tolerate oral contrast and thus just passed a PO challenge.  Patient was educated on lifestyle changes and importance of following up with GI. Pain medicine was not prescribed as patient still has pain meds at home from previous visit.  Patient verbalized agreement to this plan.  Final Clinical Impression(s) / ED Diagnoses Final diagnoses:  Generalized abdominal pain    Rx / DC Orders ED Discharge Orders     None  Netta Corrigan, PA-C 05/20/22 1247    Netta Corrigan, PA-C 05/20/22 1252    Ernie Avena, MD 05/20/22 [phone]

## 2022-05-20 NOTE — ED Triage Notes (Signed)
Pt reports chronic issues with abdominal pain and reports seeing a GI doctor. Pt reports pain has worsened today and pt has not had any relief. Pt reports he has been losing weight and has not been able to eat or drink without throwing up.

## 2022-05-27 ENCOUNTER — Encounter: Payer: Self-pay | Admitting: Gastroenterology

## 2022-05-27 ENCOUNTER — Ambulatory Visit (INDEPENDENT_AMBULATORY_CARE_PROVIDER_SITE_OTHER): Payer: 59 | Admitting: Gastroenterology

## 2022-05-27 VITALS — BP 118/82 | HR 105 | Ht 68.0 in | Wt 118.0 lb

## 2022-05-27 DIAGNOSIS — K5900 Constipation, unspecified: Secondary | ICD-10-CM

## 2022-05-27 DIAGNOSIS — R112 Nausea with vomiting, unspecified: Secondary | ICD-10-CM

## 2022-05-27 DIAGNOSIS — R6881 Early satiety: Secondary | ICD-10-CM

## 2022-05-27 DIAGNOSIS — R1010 Upper abdominal pain, unspecified: Secondary | ICD-10-CM | POA: Insufficient documentation

## 2022-05-27 DIAGNOSIS — R1013 Epigastric pain: Secondary | ICD-10-CM

## 2022-05-27 DIAGNOSIS — R1084 Generalized abdominal pain: Secondary | ICD-10-CM | POA: Diagnosis not present

## 2022-05-27 NOTE — Patient Instructions (Signed)
We have sent the following medications to your pharmacy for you to pick up at your convenience: Carafate 1 gm - Dissolve 1 tablet in 10 ml of distilled water four times daily 20-30 minutes before meals and bedtime.  Bentyl 20 mg twice daily.   Make appointment with PCP.   You have been scheduled for an abdominal ultrasound at [facility] Radiology (1st floor of hospital) on Tuesday 06/03/22 at 10 am. Please arrive 30 minutes prior to your appointment for registration. Make certain not to have anything to eat or drink 6 hours prior to your appointment. Should you need to reschedule your appointment, please contact radiology at [phone]. This test typically takes about 30 minutes to perform.  You have been scheduled for a gastric emptying scan at [facility]iology on Monday 06/09/22 at 7:30 am. Please arrive at least 30 minutes prior to your appointment for registration. Please make certain not to have anything to eat or drink after midnight the night before your test. Hold all stomach medications (ex: Zofran, phenergan, Reglan) 24 hours prior to your test. If you need to reschedule your appointment, please contact radiology scheduling at [phone]. _____________________________________________________________________ A gastric-emptying study measures how long it takes for food to move through your stomach. There are several ways to measure stomach emptying. In the most common test, you eat food that contains a small amount of radioactive material. A scanner that detects the movement of the radioactive material is placed over your abdomen to monitor the rate at which food leaves your stomach. This test normally takes about 4 hours to complete. _____________________________________________________________________  Recommend that you complete a bowel purge (to clean out your bowels). Please do the following: Purchase a bottle of Miralax over the counter as well as a box of 5 mg dulcolax  tablets. Take 4 dulcolax tablets. Wait 1 hour. You will then drink 6-8 capfuls of Miralax mixed in an adequate amount of water/juice/gatorade (you may choose which of these liquids to drink) over the next 2-3 hours. You should expect results within 1 to 6 hours after completing the bowel purge.  Follow up with Dr. Silverio Decamp in 6-8 weeks

## 2022-05-27 NOTE — Progress Notes (Addendum)
05/27/2022 Gabriel Dalton [id number] [birth year]   HISTORY OF PRESENT ILLNESS: This is a 37 year old male who is a patient Dr. Woodward Ku.  He is here today for follow-up of his abdominal pain, reflux, nausea, constipation.  He was last seen by me on 04/23/2022 at which time he was feeling maybe somewhat improved, at least stable.  He looked well at that visit.  Now he is here today with his significant other saying he is feeling awful and that something has to be done.  He is on pantoprazole 40 mg twice daily.  He is also using Linzess 145 mcg daily, which in December he said was working well.  Now he saying that it still works, but causes him a lot of griping abdominal pain.  He is asking for an emptying scan.  He feels like he is completely backed up.  Says that he cannot eat because as soon as food hits his stomach he has pain (immediately).  He says that he has pain every day and has been out of work on STD.  Feels like everything just piles and backs up.  He gets abdominal bloating and distention.  He says that if he does not eat then he is fine, but if he eats then things are just over.  He has had 4 CT scans in the past year.  He had one on January 7 and one on January 9 of this month.  The one on January 9 suggested gallbladder sludge and gallstones, but none of his other imaging has suggested that so he is questioning all of that.  He says that something has to be done or figured out or he is going to go to [facility].    Of note, on his ER visit from 05/20/2022 he did have an elevated white blood cell count at 17.3 K.  His weight is down 12 pounds since he was last seen here a month ago.   EGD 12/2021:   - LA Grade B reflux esophagitis with no bleeding. - Gastritis. Biopsied. - Normal examined duodenum.   Colonoscopy 12/2021:   - The examined portion of the ileum was normal. - Erythematous mucosa in the entire examined colon. Biopsied. - Non-bleeding external and internal  hemorrhoids.   1. Surgical [P], gastric antrum and gastric body biopsy - GASTRIC ANTRAL AND OXYNTIC MUCOSA WITH MILD NONSPECIFIC REACTIVE GASTROPATHY - HELICOBACTER PYLORI-LIKE ORGANISMS ARE NOT IDENTIFIED ON ROUTINE H&E STAIN 2. Surgical [P], random colon biopsy sites - COLONIC MUCOSA WITH NO SPECIFIC HISTOPATHOLOGIC CHANGES - NEGATIVE FOR ACUTE INFLAMMATION, FEATURES OF CHRONICITY, GRANULOMAS OR DYSPLASIA     Past Medical History:  Diagnosis Date   Bronchitis    Multiple gastric ulcers    Past Surgical History:  Procedure Laterality Date   COLONOSCOPY     I & D EXTREMITY  10/18/2011   Procedure: IRRIGATION AND DEBRIDEMENT EXTREMITY;  Surgeon: Roseanne Kaufman, MD;  Location: [facility];  Service: Orthopedics;  Laterality: Right;  Irrigation and Debridement  Right Middle Finger; Removal of foreign body    reports that he has been smoking cigarettes. He has been smoking an average of 1 pack per day. He has never used smokeless tobacco. He reports that he does not currently use alcohol. He reports that he does not currently use drugs. Frequency: 3.00 times per week. family history includes Brain cancer in his maternal uncle; Colon cancer in his maternal uncle; Healthy in his mother; Heart disease in his maternal aunt; Hypertension  in his father; Rectal cancer in his maternal uncle. Allergies  Allergen Reactions   Aspirin     Cannot take due to stomach ulcers      Outpatient Encounter Medications as of 05/27/2022  Medication Sig   linaclotide (LINZESS) 145 MCG CAPS capsule Take 1 capsule (145 mcg total) by mouth daily before breakfast.   oxyCODONE-acetaminophen (PERCOCET/ROXICET) 5-325 MG tablet Take 1 tablet by mouth every 6 (six) hours as needed for severe pain.   pantoprazole (PROTONIX) 40 MG tablet Take 1 tablet (40 mg total) by mouth 2 (two) times daily.   promethazine (PHENERGAN) 25 MG tablet Take 1 tablet (25 mg total) by mouth every 6 (six) hours as needed for nausea or vomiting.    No facility-administered encounter medications on file as of 05/27/2022.     REVIEW OF SYSTEMS  : All other systems reviewed and negative except where noted in the History of Present Illness.   PHYSICAL EXAM: BP 118/82   Pulse (!) 105   Ht 5\' 8"  (1.727 m)   Wt 118 lb (53.5 kg)   BMI 17.94 kg/m  General: Well developed AA male in no acute distress Head: Normocephalic and atraumatic Eyes:  Sclerae anicteric, conjunctiva pink. Ears: Normal auditory acuity Lungs: Clear throughout to auscultation; no W/R/R. Heart:  Slightly tachy.  No M/R/G. Abdomen: Soft, non-distended.  BS present.  Expresses diffuse tenderness even to very light palpation. Musculoskeletal: Symmetrical with no gross deformities  Skin: No lesions on visible extremities Extremities: No edema  Neurological: Alert oriented x 4, grossly non-focal Psychological:  Alert and cooperative. Normal mood and affect  ASSESSMENT AND PLAN: *GERD, gastritis, history of gastric ulcers, Grade B esophagitis, early satiety, upper GI symptoms possibly related to cannabis use:  Has been on pantoprazole 40 mg BID.  At his visit a month ago things were improving or at least stable, but today he reports worsened/significant symptoms again.  Will check a gastric emptying scan.  We will also check an ultrasound since his last CT scan indicated gallstones and sludge, but those were not seen on any other previous imaging including a CT scan just a few days prior to that.  Will start Carafate ACHS.  Was given tablet due to insurance reasons, but informed on how to make it into a slurry. *Generalized abdominal pain with constipation and previous abnormal colonoscopy.  Colonoscopy in 04/2019 showed friability of the terminal ileum and mucosal ulcerations.  Biopsy showed edema, but nothing consistent with IBD.  Repeat colonoscopy in August unremarkable with unremarkable biopsies.  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  He had a great response to Linzess 140 mcg daily  initially, and still says that it works, but he just feels like he is entirely backed up.  Will do MiraLAX bowel purge this evening and have him continue to take the Linzess daily.  Will give some Bentyl to use twice daily for his abdominal pain as well.  Prescriptions sent to pharmacy.  **We will follow-up with Dr. Silverio Decamp in 6 to 8 weeks.   CC:  Marrian Salvage,*

## 2022-05-28 ENCOUNTER — Telehealth: Payer: Self-pay | Admitting: Gastroenterology

## 2022-05-28 MED ORDER — SUCRALFATE 1 G PO TABS: 1.0000 g | ORAL_TABLET | Freq: Three times a day (TID) | ORAL | 3 refills | Status: DC

## 2022-05-28 MED ORDER — DICYCLOMINE HCL 20 MG PO TABS: 20.0000 mg | ORAL_TABLET | Freq: Two times a day (BID) | ORAL | 3 refills | Status: DC

## 2022-05-28 NOTE — Telephone Encounter (Signed)
Patient's wife called, stated Carafate and Bentyl were supposed to be sent to [facility] pharmacy on [address] but when she went to pick it up, the prescription had not been sent. Please advise.

## 2022-05-28 NOTE — Telephone Encounter (Signed)
Scripts sent to pharmacy 

## 2022-05-28 NOTE — Addendum Note (Signed)
Addended by: Horris Latino on: 05/28/2022 11:10 AM   Modules accepted: Orders

## 2022-05-30 NOTE — Telephone Encounter (Signed)
Patient stated they faxed over more paperwork regarding his FMLA and would like to get an update on the status of it. Please advise.

## 2022-05-30 NOTE — Telephone Encounter (Signed)
Spoke with Pt he was advised that his paperwork is asking for any treatment form January 8th to present and pt has not been seen in out office in those dates. Pt was advised to call gastroenterologist Dr to fill out paper work as they do manage more for him. Pt also stated at his appt coming up on 06/03/2022 he would like a prostate exam or a referral sent out as he is having "a lot of problems with it".

## 2022-05-31 ENCOUNTER — Other Ambulatory Visit: Payer: Self-pay

## 2022-05-31 ENCOUNTER — Emergency Department (HOSPITAL_BASED_OUTPATIENT_CLINIC_OR_DEPARTMENT_OTHER): Payer: 59

## 2022-05-31 ENCOUNTER — Emergency Department (HOSPITAL_BASED_OUTPATIENT_CLINIC_OR_DEPARTMENT_OTHER)
Admission: EM | Admit: 2022-05-31 | Discharge: 2022-05-31 | Disposition: A | Discharge status: Home / Self Care | Payer: 59 | Attending: Emergency Medicine | Admitting: Emergency Medicine

## 2022-05-31 DIAGNOSIS — U071 COVID-19: Secondary | ICD-10-CM | POA: Diagnosis not present

## 2022-05-31 DIAGNOSIS — K6289 Other specified diseases of anus and rectum: Secondary | ICD-10-CM | POA: Diagnosis present

## 2022-05-31 DIAGNOSIS — K625 Hemorrhage of anus and rectum: Secondary | ICD-10-CM | POA: Diagnosis not present

## 2022-05-31 DIAGNOSIS — R1084 Generalized abdominal pain: Secondary | ICD-10-CM

## 2022-05-31 LAB — CBC WITH DIFFERENTIAL/PLATELET
Abs Immature Granulocytes: 0.03 10*3/uL (ref 0.00–0.07)
Basophils Absolute: 0 10*3/uL (ref 0.0–0.1)
Basophils Relative: 0 %
Eosinophils Absolute: 0 10*3/uL (ref 0.0–0.5)
Eosinophils Relative: 0 %
HCT: 41 % (ref 39.0–52.0)
Hemoglobin: 14.1 g/dL (ref 13.0–17.0)
Immature Granulocytes: 0 %
Lymphocytes Relative: 16 %
Lymphs Abs: 1.8 10*3/uL (ref 0.7–4.0)
MCH: 29.5 pg (ref 26.0–34.0)
MCHC: 34.4 g/dL (ref 30.0–36.0)
MCV: 85.8 fL (ref 80.0–100.0)
Monocytes Absolute: 1.6 10*3/uL — ABNORMAL HIGH (ref 0.1–1.0)
Monocytes Relative: 14 %
Neutro Abs: 7.9 10*3/uL — ABNORMAL HIGH (ref 1.7–7.7)
Neutrophils Relative %: 70 %
Platelets: 493 10*3/uL — ABNORMAL HIGH (ref 150–400)
RBC: 4.78 MIL/uL (ref 4.22–5.81)
RDW: 13.5 % (ref 11.5–15.5)
WBC: 11.4 10*3/uL — ABNORMAL HIGH (ref 4.0–10.5)
nRBC: 0 % (ref 0.0–0.2)

## 2022-05-31 LAB — COMPREHENSIVE METABOLIC PANEL
ALT: 10 U/L (ref 0–44)
AST: 13 U/L — ABNORMAL LOW (ref 15–41)
Albumin: 4.3 g/dL (ref 3.5–5.0)
Alkaline Phosphatase: 66 U/L (ref 38–126)
Anion gap: 13 (ref 5–15)
BUN: 19 mg/dL (ref 6–20)
CO2: 27 mmol/L (ref 22–32)
Calcium: 10 mg/dL (ref 8.9–10.3)
Chloride: 93 mmol/L — ABNORMAL LOW (ref 98–111)
Creatinine, Ser: 0.89 mg/dL (ref 0.61–1.24)
GFR, Estimated: >60 mL/min (ref >60)
Glucose, Bld: 96 mg/dL (ref 70–99)
Potassium: 3.8 mmol/L (ref 3.5–5.1)
Sodium: 133 mmol/L — ABNORMAL LOW (ref 135–145)
Total Bilirubin: 0.3 mg/dL (ref 0.3–1.2)
Total Protein: 7.9 g/dL (ref 6.5–8.1)

## 2022-05-31 LAB — RESP PANEL BY RT-PCR (RSV, FLU A&B, COVID)  RVPGX2
Influenza A by PCR: NEGATIVE
Influenza B by PCR: NEGATIVE
Resp Syncytial Virus by PCR: NEGATIVE
SARS Coronavirus 2 by RT PCR: POSITIVE — AB

## 2022-05-31 LAB — URINALYSIS, ROUTINE W REFLEX MICROSCOPIC
Bacteria, UA: NONE SEEN
Bilirubin Urine: NEGATIVE
Glucose, UA: NEGATIVE mg/dL
Hgb urine dipstick: NEGATIVE
Leukocytes,Ua: NEGATIVE
Nitrite: NEGATIVE
Protein, ur: 30 mg/dL — AB
Specific Gravity, Urine: 1.031 — ABNORMAL HIGH (ref 1.005–1.030)
pH: 5.5 (ref 5.0–8.0)

## 2022-05-31 LAB — LIPASE, BLOOD: Lipase: 10 U/L — ABNORMAL LOW (ref 11–51)

## 2022-05-31 LAB — OCCULT BLOOD X 1 CARD TO LAB, STOOL: Fecal Occult Bld: POSITIVE — AB

## 2022-05-31 MED ORDER — MORPHINE SULFATE (PF) 4 MG/ML IV SOLN
4.0000 mg | Freq: Once | INTRAVENOUS | Status: AC
  Administered 2022-05-31: 4 mg via INTRAVENOUS
  Filled 2022-05-31: qty 1

## 2022-05-31 MED ORDER — PANTOPRAZOLE SODIUM 40 MG IV SOLR
40.0000 mg | Freq: Once | INTRAVENOUS | Status: AC
  Administered 2022-05-31: 40 mg via INTRAVENOUS
  Filled 2022-05-31: qty 10

## 2022-05-31 MED ORDER — SODIUM CHLORIDE 0.9 % IV BOLUS
1000.0000 mL | Freq: Once | INTRAVENOUS | Status: AC
  Administered 2022-05-31: 1000 mL via INTRAVENOUS

## 2022-05-31 NOTE — Discharge Instructions (Addendum)
Follow-up with your gastroenterologist as discussed.  Return to the emergency room if you have any worsening symptoms.

## 2022-05-31 NOTE — ED Notes (Signed)
EDP at [facility] 

## 2022-05-31 NOTE — ED Provider Notes (Signed)
[name] EMERGENCY DEPARTMENT AT [facility] Provider Note   CSN: [id number] Arrival date & time: 05/31/22  1129     History  Chief Complaint  Patient presents with   prostate pain    Gabriel Dalton is a 37 y.o. male.  Patient is a 37 year old male who presents with rectal pain.  He has had some ongoing GI issues and is followed by [facility] gastroenterology.  He has had some intermittent abdominal pains for the last few months.  He said that he feels like he is all backed up.  He says when he gets backed up like this he has rectal pain and he feels like it may be related to his prostate.  He has had some bleeding from his rectum recently.  This pain and bleeding has been going for the last 3 to 4 days.  No vomiting.  No change in his stools.  He did see his gastroenterologist on the 16th of this month and he was started on Linzess but he says he has not had any improvement in the constipation.  He is set up to have gastric emptying study and abdominal ultrasound.  He has had 2 CTs of his abdomen and pelvis this month which have not showed any acute abnormality other than some possible gallbladder sludge.  He also says he is having pain all over.  He says "maybe I have the flu".  He denies any cough or cold symptoms.  No known fevers.  He does have some pain in his back but no spinal pain.  He says when his abdomen has pain it goes to his back on the sides bilaterally.  He says that his legs are in pain but then he also says they feel numb.  He denies any weakness in his legs.  Denies any arm involvement.  No urinary symptoms.  No incontinence or loss of bowel or bladder control.  No urinary symptoms.  No penile discharge.       Home Medications Prior to Admission medications   Medication Sig Start Date End Date Taking? Authorizing Provider  dicyclomine (BENTYL) 20 MG tablet Take 1 tablet (20 mg total) by mouth 2 (two) times daily. 05/28/22   Zehr, Princella Pellegrini, PA-C   linaclotide (LINZESS) 145 MCG CAPS capsule Take 1 capsule (145 mcg total) by mouth daily before breakfast. 04/23/22   Zehr, Princella Pellegrini, PA-C  oxyCODONE-acetaminophen (PERCOCET/ROXICET) 5-325 MG tablet Take 1 tablet by mouth every 6 (six) hours as needed for severe pain. 05/18/22   Jeanelle Malling, PA  pantoprazole (PROTONIX) 40 MG tablet Take 1 tablet (40 mg total) by mouth 2 (two) times daily. 04/23/22   Zehr, Princella Pellegrini, PA-C  promethazine (PHENERGAN) 25 MG tablet Take 1 tablet (25 mg total) by mouth every 6 (six) hours as needed for nausea or vomiting. 05/18/22   Jeanelle Malling, PA  sucralfate (CARAFATE) 1 g tablet Take 1 tablet (1 g total) by mouth 4 (four) times daily -  with meals and at bedtime. 05/28/22   Zehr, Princella Pellegrini, PA-C      Allergies    Aspirin    Review of Systems   Review of Systems  Constitutional:  Positive for fatigue. Negative for chills, diaphoresis and fever.  HENT:  Negative for congestion, rhinorrhea and sneezing.   Eyes: Negative.   Respiratory:  Negative for cough, chest tightness and shortness of breath.   Cardiovascular:  Negative for chest pain and leg swelling.  Gastrointestinal:  Positive for abdominal  pain, blood in stool and rectal pain. Negative for diarrhea, nausea and vomiting.  Genitourinary:  Negative for difficulty urinating, flank pain, frequency and hematuria.  Musculoskeletal:  Positive for myalgias. Negative for arthralgias and back pain.  Skin:  Negative for rash.  Neurological:  Positive for numbness. Negative for dizziness, speech difficulty, weakness and headaches.    Physical Exam Updated Vital Signs BP (!) 146/105   Pulse 87   Temp 98.1 F (36.7 C) (Oral)   Resp 13   Wt 53.5 kg   SpO2 100%   BMI 17.94 kg/m  Physical Exam Constitutional:      Appearance: He is well-developed.  HENT:     Head: Normocephalic and atraumatic.  Eyes:     Pupils: Pupils are equal, round, and reactive to light.  Cardiovascular:     Rate and Rhythm: Normal rate and  regular rhythm.     Heart sounds: Normal heart sounds.  Pulmonary:     Effort: Pulmonary effort is normal. No respiratory distress.     Breath sounds: Normal breath sounds. No wheezing or rales.  Chest:     Chest wall: No tenderness.  Abdominal:     General: Bowel sounds are normal.     Palpations: Abdomen is soft.     Tenderness: There is abdominal tenderness (generalized). There is no guarding or rebound.  Genitourinary:    Comments: With RN Glean Salvo present to chaperone, rectal exam is performed.  There is some generalized tenderness to the area but no visualized hemorrhoids.  No gross blood on exam.  No specific areas of tenderness that would be more concerning for perirectal abscess.  No direct tenderness over the prostate. Musculoskeletal:        General: Normal range of motion.     Cervical back: Normal range of motion and neck supple.  Lymphadenopathy:     Cervical: No cervical adenopathy.  Skin:    General: Skin is warm and dry.     Findings: No rash.  Neurological:     Mental Status: He is alert and oriented to person, place, and time.     Comments: Motor 5/5 all extremities Sensation grossly intact to LT all extremities, although diminished in his legs per pt Finger to Nose intact, no pronator drift CN II-XII grossly intact       ED Results / Procedures / Treatments   Labs (all labs ordered are listed, but only abnormal results are displayed) Labs Reviewed  RESP PANEL BY RT-PCR (RSV, FLU A&B, COVID)  RVPGX2 - Abnormal; Notable for the following components:      Result Value   SARS Coronavirus 2 by RT PCR POSITIVE (*)    All other components within normal limits  LIPASE, BLOOD - Abnormal; Notable for the following components:   Lipase 10 (*)    All other components within normal limits  COMPREHENSIVE METABOLIC PANEL - Abnormal; Notable for the following components:   Sodium 133 (*)    Chloride 93 (*)    AST 13 (*)    All other components within normal  limits  URINALYSIS, ROUTINE W REFLEX MICROSCOPIC - Abnormal; Notable for the following components:   Specific Gravity, Urine 1.031 (*)    Ketones, ur TRACE (*)    Protein, ur 30 (*)    All other components within normal limits  CBC WITH DIFFERENTIAL/PLATELET - Abnormal; Notable for the following components:   WBC 11.4 (*)    Platelets 493 (*)    Neutro Abs 7.9 (*)  Monocytes Absolute 1.6 (*)    All other components within normal limits  OCCULT BLOOD X 1 CARD TO LAB, STOOL - Abnormal; Notable for the following components:   Fecal Occult Bld POSITIVE (*)    All other components within normal limits    EKG None  Radiology DG ABD ACUTE 2+V W 1V CHEST  Result Date: 05/31/2022 CLINICAL DATA:  Abdominal pain, constipation EXAM: DG ABDOMEN ACUTE WITH 1 VIEW CHEST COMPARISON:  None Available. FINDINGS: No focal consolidation. No pleural effusion or pneumothorax. Heart and mediastinal contours are unremarkable. No bowel dilatation to suggest obstruction. No air-fluid levels. No pneumoperitoneum, portal venous gas, or pneumatosis. No urolithiasis. No acute osseous abnormality. Levocurvature of the lumbar spine. IMPRESSION: Negative abdominal radiographs.  No acute cardiopulmonary disease. Electronically Signed   By: Kathreen Devoid M.D.   On: 05/31/2022 14:19    Procedures Procedures    Medications Ordered in ED Medications  morphine (PF) 4 MG/ML injection 4 mg (4 mg Intravenous Given 05/31/22 1337)  sodium chloride 0.9 % bolus 1,000 mL (1,000 mLs Intravenous New Bag/Given 05/31/22 1339)  pantoprazole (PROTONIX) injection 40 mg (40 mg Intravenous Given 05/31/22 1410)    ED Course/ Medical Decision Making/ A&P                             Medical Decision Making Amount and/or Complexity of Data Reviewed Labs: ordered. Radiology: ordered.  Risk Prescription drug management.   Patient presents with abdominal pain, rectal pain and feeling of constipation.  The symptoms been ongoing for a  while.  He says intermittently he will have pain in his rectum which he feels is a continuation of his abdominal pain.  Rectal exam showed some generalized tenderness but no clinical concerns for perirectal abscess.  No visualized hemorrhoids.  No gross blood on exam although it was Hemoccult positive.  His hemoglobin is normal.  Urinalysis is not concerning for infection and he does not have other symptoms that sound more consistent with prostatitis.  His other labs are nonconcerning.  No evidence of pancreatitis.  No evidence of hepatitis.  X-rays were performed of his abdomen which were interpreted by me and can firmed by the radiologist to show no evidence of obstruction.  No significant stool burden.  He has had 2 other CT of his abdomen pelvis this month so I did not feel that repeat CT was indicated.  His COVID test is positive which may explain his leg pain and overall feeling worse.  Advised on symptomatic care.  He declines antiviral treatment at this time which I feel is appropriate.  Symptomatic care instructions and quarantine precautions were given.  He will continue his GI meds as prescribed by his gastroenterologist.  I did advise him to follow-up with his gastroenterologist and let them know he did have blood in his stool.  Strict return precautions were given.  Final Clinical Impression(s) / ED Diagnoses Final diagnoses:  COVID-19 virus infection  Rectal bleeding  Generalized abdominal pain    Rx / DC Orders ED Discharge Orders     None         Malvin Johns, MD 05/31/22 1513

## 2022-05-31 NOTE — ED Triage Notes (Signed)
BIB wife from home for prostate pain, onset 3 days ago, associated with NVD, sob, alterred sensation in fingers, legs and toes, also rectal bleeding (bright red). Denies fever. No known hemorrhoids. No meds PTA. Alert, NAD, calm, interactive, some hyperventilating.

## 2022-06-02 ENCOUNTER — Emergency Department (HOSPITAL_COMMUNITY)
Admission: EM | Admit: 2022-06-02 | Discharge: 2022-06-03 | Disposition: A | Discharge status: Home / Self Care | Payer: 59 | Attending: Student | Admitting: Student

## 2022-06-02 ENCOUNTER — Ambulatory Visit: Payer: Self-pay | Admitting: *Deleted

## 2022-06-02 ENCOUNTER — Other Ambulatory Visit: Payer: Self-pay

## 2022-06-02 DIAGNOSIS — R1013 Epigastric pain: Secondary | ICD-10-CM | POA: Insufficient documentation

## 2022-06-02 DIAGNOSIS — U071 COVID-19: Secondary | ICD-10-CM | POA: Insufficient documentation

## 2022-06-02 DIAGNOSIS — M79661 Pain in right lower leg: Secondary | ICD-10-CM | POA: Insufficient documentation

## 2022-06-02 DIAGNOSIS — R1084 Generalized abdominal pain: Secondary | ICD-10-CM | POA: Diagnosis not present

## 2022-06-02 DIAGNOSIS — Z5321 Procedure and treatment not carried out due to patient leaving prior to being seen by health care provider: Secondary | ICD-10-CM | POA: Diagnosis not present

## 2022-06-02 DIAGNOSIS — K5900 Constipation, unspecified: Secondary | ICD-10-CM | POA: Insufficient documentation

## 2022-06-02 DIAGNOSIS — R6881 Early satiety: Secondary | ICD-10-CM | POA: Diagnosis not present

## 2022-06-02 DIAGNOSIS — M79662 Pain in left lower leg: Secondary | ICD-10-CM | POA: Insufficient documentation

## 2022-06-02 MED ORDER — HYDROCODONE-ACETAMINOPHEN 5-325 MG PO TABS
1.0000 | ORAL_TABLET | Freq: Once | ORAL | Status: AC
  Administered 2022-06-03: 1 via ORAL
  Filled 2022-06-02: qty 1

## 2022-06-02 NOTE — Telephone Encounter (Signed)
His whole leg is numb. Reason for Disposition  Patient sounds very sick or weak to the triager    Right leg is numb and he can't walk.  Answer Assessment - Initial Assessment Questions 1. ONSET: "When did the pain start?"      Wife Jannet Askew calling in.   His leg is numb and he can't walk.       2. LOCATION: "Where is the pain located?"      Right leg is numb.   For several days it's been numb.   He has Covid.   Does that have anything to do with his leg being numb? When he was seen a few days ago he was c/o it hurting to the dr.   Deborha Payment told him he was dehydrated and had Covid but didn't say anything about his leg.   He has an appt. With his PCP tomorrow at 2:00 but I didn't know if I should wait that long. I referred her on to the ED since he cannot walk and it's numb and hurting him so bad he can't walk.    She was agreeable to taking him to the ED now. 3. PAIN: "How bad is the pain?"    (Scale 1-10; or mild, moderate, severe)   -  MILD (1-3): doesn't interfere with normal activities    -  MODERATE (4-7): interferes with normal activities (e.g., work or school) or awakens from sleep, limping    -  SEVERE (8-10): excruciating pain, unable to do any normal activities, unable to walk     Severe numbness and pain.   He can't walk. 4. WORK OR EXERCISE: "Has there been any recent work or exercise that involved this part of the body?"      Has Covid 5. CAUSE: "What do you think is causing the leg pain?"     I don't know 6. OTHER SYMPTOMS: "Do you have any other symptoms?" (e.g., chest pain, back pain, breathing difficulty, swelling, rash, fever, numbness, weakness)     Going on to the ED. 7. PREGNANCY: "Is there any chance you are pregnant?" "When was your last menstrual period?"     N/A  Protocols used: Leg Pain-A-AH

## 2022-06-02 NOTE — ED Provider Triage Note (Signed)
Emergency Medicine Provider Triage Evaluation Note  Erhard Senske , a 37 y.o. male  was evaluated in triage.  Pt complains of pain in bilateral lower legs. Has been worsening for the past few days. Rolling around and screaming in pain. No midline L spine tenderness. Says his legs feel numb, yet also painful. No trauma, fevers, chills, Cx history, IVDU or risk factors for abscess. Says that pain is worse with cold air.   Also diagnosed with covid 2 days ago  Similar presentation in 2016. Negative workup at that time. Again complained of the same in 2018 without findings. Saw Podiatry for similar in 2021. Review of Systems  Positive:  Negative:   Physical Exam  BP (!) 115/94   Pulse (!) 112   Resp 16   SpO2 100%  Gen:   Awake, no distress   Resp:  Normal effort  MSK:   Moves extremities without difficulty  Other:  No swelling. No midline tenderness.   Medical Decision Making  Medically screening exam initiated at 11:40 PM.  Appropriate orders placed.  Jakai Dorrell Bankhead was informed that the remainder of the evaluation will be completed by another provider, this initial triage assessment does not replace that evaluation, and the importance of remaining in the ED until their evaluation is complete.     Rhae Hammock, PA-C 06/02/22 2348

## 2022-06-02 NOTE — Telephone Encounter (Signed)
  Chief Complaint: Wife calling in that his right leg is numb and he can't walk.   He has Covid too was diagnosed with that in the ED  Symptoms: Right leg is numb he can't walk Frequency: For several days but today it's worse Pertinent Negatives: Patient denies N/A Disposition: [x] ED /[] Urgent Care (no appt availability in office) / [] Appointment(In office/virtual)/ []  [facility]/ [] Home Care/ [] Refused Recommended Disposition /[] [facility]/ []  Follow-up with PCP Additional Notes: Referred to the ED.

## 2022-06-02 NOTE — ED Triage Notes (Signed)
Pt arrives with bilateral leg numbness, tingling and pain for the last few days. Pt denies any injuries. Pt has history of GI issues. Pt has had poor PO intake as well. Pt denies numbness to arms.

## 2022-06-03 ENCOUNTER — Emergency Department (HOSPITAL_COMMUNITY)
Admission: RE | Admit: 2022-06-03 | Discharge: 2022-06-03 | Disposition: A | Discharge status: Home / Self Care | Payer: 59 | Source: Ambulatory Visit | Attending: Gastroenterology | Admitting: Gastroenterology

## 2022-06-03 ENCOUNTER — Ambulatory Visit: Payer: 59 | Admitting: Family

## 2022-06-03 DIAGNOSIS — R1013 Epigastric pain: Secondary | ICD-10-CM | POA: Insufficient documentation

## 2022-06-03 DIAGNOSIS — R6881 Early satiety: Secondary | ICD-10-CM

## 2022-06-03 DIAGNOSIS — K5900 Constipation, unspecified: Secondary | ICD-10-CM | POA: Insufficient documentation

## 2022-06-03 DIAGNOSIS — R1084 Generalized abdominal pain: Secondary | ICD-10-CM

## 2022-06-03 LAB — CBC WITH DIFFERENTIAL/PLATELET
Abs Immature Granulocytes: 0.04 10*3/uL (ref 0.00–0.07)
Basophils Absolute: 0 10*3/uL (ref 0.0–0.1)
Basophils Relative: 0 %
Eosinophils Absolute: 0.1 10*3/uL (ref 0.0–0.5)
Eosinophils Relative: 1 %
HCT: 41.6 % (ref 39.0–52.0)
Hemoglobin: 14 g/dL (ref 13.0–17.0)
Immature Granulocytes: 0 %
Lymphocytes Relative: 25 %
Lymphs Abs: 2.3 10*3/uL (ref 0.7–4.0)
MCH: 29 pg (ref 26.0–34.0)
MCHC: 33.7 g/dL (ref 30.0–36.0)
MCV: 86.3 fL (ref 80.0–100.0)
Monocytes Absolute: 1 10*3/uL (ref 0.1–1.0)
Monocytes Relative: 11 %
Neutro Abs: 5.8 10*3/uL (ref 1.7–7.7)
Neutrophils Relative %: 63 %
Platelets: 537 10*3/uL — ABNORMAL HIGH (ref 150–400)
RBC: 4.82 MIL/uL (ref 4.22–5.81)
RDW: 13.5 % (ref 11.5–15.5)
WBC: 9.2 10*3/uL (ref 4.0–10.5)
nRBC: 0 % (ref 0.0–0.2)

## 2022-06-03 LAB — BASIC METABOLIC PANEL
Anion gap: 13 (ref 5–15)
BUN: 12 mg/dL (ref 6–20)
CO2: 25 mmol/L (ref 22–32)
Calcium: 9.1 mg/dL (ref 8.9–10.3)
Chloride: 95 mmol/L — ABNORMAL LOW (ref 98–111)
Creatinine, Ser: 0.84 mg/dL (ref 0.61–1.24)
GFR, Estimated: >60 mL/min (ref >60)
Glucose, Bld: 119 mg/dL — ABNORMAL HIGH (ref 70–99)
Potassium: 3.2 mmol/L — ABNORMAL LOW (ref 3.5–5.1)
Sodium: 133 mmol/L — ABNORMAL LOW (ref 135–145)

## 2022-06-09 ENCOUNTER — Encounter (HOSPITAL_COMMUNITY)
Admission: RE | Admit: 2022-06-09 | Discharge: 2022-06-09 | Disposition: A | Discharge status: Home / Self Care | Payer: 59 | Source: Ambulatory Visit | Attending: Gastroenterology | Admitting: Gastroenterology

## 2022-06-09 DIAGNOSIS — K5900 Constipation, unspecified: Secondary | ICD-10-CM

## 2022-06-09 DIAGNOSIS — R6881 Early satiety: Secondary | ICD-10-CM | POA: Diagnosis present

## 2022-06-09 DIAGNOSIS — R1013 Epigastric pain: Secondary | ICD-10-CM

## 2022-06-09 DIAGNOSIS — R1084 Generalized abdominal pain: Secondary | ICD-10-CM | POA: Diagnosis present

## 2022-06-09 MED ORDER — TECHNETIUM TC 99M SULFUR COLLOID
2.0100 | Freq: Once | INTRAVENOUS | Status: AC | PRN
  Administered 2022-06-09: 2.01 via INTRAVENOUS

## 2022-06-13 ENCOUNTER — Telehealth: Payer: Self-pay | Admitting: Gastroenterology

## 2022-06-13 NOTE — Telephone Encounter (Signed)
Nira Conn do you know anything about the FMLA paperwork?

## 2022-06-13 NOTE — Telephone Encounter (Signed)
Can you please check with Gabriel Dalton and see if he turned in the paperwork and if not, let him know he needs to resubmit? I never see these and have no way of knowing if it was done or not.  Thank you for helping.

## 2022-06-13 NOTE — Telephone Encounter (Signed)
Patient called to follow up on FMLA paperwork he said we have.

## 2022-06-16 NOTE — Telephone Encounter (Signed)
FMLA paperwork placed on your desk to complete.

## 2022-06-16 NOTE — Telephone Encounter (Signed)
Patient called, requesting follow up on FMLA paperwork.

## 2022-06-17 NOTE — Telephone Encounter (Signed)
Spoke with patient and informed him that per [place], [place] we are currently unable to complete disability paperwork as we do not have a diagnosis at this time to keep him out of work. We would be glad to fill out FMLA for any intermittent issue only. Paperwork uncompleted to per patient's request.

## 2022-06-23 ENCOUNTER — Telehealth: Payer: Self-pay | Admitting: Family

## 2022-06-23 NOTE — Telephone Encounter (Signed)
Shawna Orleans FMLA forms faxed into front office Placed in [place] bin up front

## 2022-06-23 NOTE — Telephone Encounter (Signed)
Form has been placed in PCP's folder to be reviewed.

## 2022-06-24 ENCOUNTER — Ambulatory Visit: Payer: 59 | Admitting: Student

## 2022-06-24 ENCOUNTER — Emergency Department (HOSPITAL_COMMUNITY): Payer: 59

## 2022-06-24 ENCOUNTER — Observation Stay (HOSPITAL_COMMUNITY): Payer: 59

## 2022-06-24 ENCOUNTER — Other Ambulatory Visit: Payer: Self-pay

## 2022-06-24 ENCOUNTER — Other Ambulatory Visit: Payer: Self-pay | Admitting: Family

## 2022-06-24 ENCOUNTER — Inpatient Hospital Stay (HOSPITAL_COMMUNITY)
Admission: EM | Admit: 2022-06-24 | Discharge: 2022-07-12 | DRG: 853 | Disposition: A | Discharge status: Home Health | Payer: 59 | Attending: Internal Medicine | Admitting: Internal Medicine

## 2022-06-24 ENCOUNTER — Encounter (HOSPITAL_COMMUNITY): Payer: Self-pay

## 2022-06-24 DIAGNOSIS — D75838 Other thrombocytosis: Secondary | ICD-10-CM | POA: Diagnosis present

## 2022-06-24 DIAGNOSIS — M5126 Other intervertebral disc displacement, lumbar region: Secondary | ICD-10-CM | POA: Diagnosis present

## 2022-06-24 DIAGNOSIS — G8929 Other chronic pain: Secondary | ICD-10-CM | POA: Diagnosis present

## 2022-06-24 DIAGNOSIS — Z978 Presence of other specified devices: Secondary | ICD-10-CM

## 2022-06-24 DIAGNOSIS — R3916 Straining to void: Secondary | ICD-10-CM | POA: Diagnosis present

## 2022-06-24 DIAGNOSIS — R7303 Prediabetes: Secondary | ICD-10-CM | POA: Diagnosis present

## 2022-06-24 DIAGNOSIS — K651 Peritoneal abscess: Secondary | ICD-10-CM | POA: Diagnosis present

## 2022-06-24 DIAGNOSIS — N400 Enlarged prostate without lower urinary tract symptoms: Secondary | ICD-10-CM

## 2022-06-24 DIAGNOSIS — D638 Anemia in other chronic diseases classified elsewhere: Secondary | ICD-10-CM | POA: Diagnosis present

## 2022-06-24 DIAGNOSIS — E43 Unspecified severe protein-calorie malnutrition: Secondary | ICD-10-CM | POA: Diagnosis not present

## 2022-06-24 DIAGNOSIS — J9600 Acute respiratory failure, unspecified whether with hypoxia or hypercapnia: Secondary | ICD-10-CM | POA: Diagnosis not present

## 2022-06-24 DIAGNOSIS — K659 Peritonitis, unspecified: Secondary | ICD-10-CM

## 2022-06-24 DIAGNOSIS — E162 Hypoglycemia, unspecified: Secondary | ICD-10-CM | POA: Diagnosis not present

## 2022-06-24 DIAGNOSIS — E860 Dehydration: Secondary | ICD-10-CM | POA: Diagnosis present

## 2022-06-24 DIAGNOSIS — R109 Unspecified abdominal pain: Secondary | ICD-10-CM | POA: Diagnosis present

## 2022-06-24 DIAGNOSIS — Z8379 Family history of other diseases of the digestive system: Secondary | ICD-10-CM

## 2022-06-24 DIAGNOSIS — E876 Hypokalemia: Secondary | ICD-10-CM | POA: Diagnosis not present

## 2022-06-24 DIAGNOSIS — K8020 Calculus of gallbladder without cholecystitis without obstruction: Secondary | ICD-10-CM | POA: Diagnosis not present

## 2022-06-24 DIAGNOSIS — R1010 Upper abdominal pain, unspecified: Principal | ICD-10-CM | POA: Diagnosis present

## 2022-06-24 DIAGNOSIS — K660 Peritoneal adhesions (postprocedural) (postinfection): Secondary | ICD-10-CM | POA: Diagnosis present

## 2022-06-24 DIAGNOSIS — Z886 Allergy status to analgesic agent status: Secondary | ICD-10-CM

## 2022-06-24 DIAGNOSIS — L89152 Pressure ulcer of sacral region, stage 2: Secondary | ICD-10-CM | POA: Diagnosis not present

## 2022-06-24 DIAGNOSIS — E871 Hypo-osmolality and hyponatremia: Secondary | ICD-10-CM | POA: Diagnosis present

## 2022-06-24 DIAGNOSIS — N401 Enlarged prostate with lower urinary tract symptoms: Secondary | ICD-10-CM | POA: Diagnosis present

## 2022-06-24 DIAGNOSIS — J90 Pleural effusion, not elsewhere classified: Secondary | ICD-10-CM | POA: Diagnosis present

## 2022-06-24 DIAGNOSIS — E8809 Other disorders of plasma-protein metabolism, not elsewhere classified: Secondary | ICD-10-CM | POA: Diagnosis not present

## 2022-06-24 DIAGNOSIS — J9811 Atelectasis: Secondary | ICD-10-CM | POA: Diagnosis not present

## 2022-06-24 DIAGNOSIS — Z800 Family history of malignant neoplasm of digestive organs: Secondary | ICD-10-CM

## 2022-06-24 DIAGNOSIS — R64 Cachexia: Secondary | ICD-10-CM | POA: Diagnosis present

## 2022-06-24 DIAGNOSIS — K567 Ileus, unspecified: Secondary | ICD-10-CM | POA: Diagnosis not present

## 2022-06-24 DIAGNOSIS — F17210 Nicotine dependence, cigarettes, uncomplicated: Secondary | ICD-10-CM | POA: Diagnosis present

## 2022-06-24 DIAGNOSIS — T4275XA Adverse effect of unspecified antiepileptic and sedative-hypnotic drugs, initial encounter: Secondary | ICD-10-CM | POA: Diagnosis not present

## 2022-06-24 DIAGNOSIS — Z681 Body mass index (BMI) 19 or less, adult: Secondary | ICD-10-CM

## 2022-06-24 DIAGNOSIS — K219 Gastro-esophageal reflux disease without esophagitis: Secondary | ICD-10-CM | POA: Diagnosis present

## 2022-06-24 DIAGNOSIS — Z8249 Family history of ischemic heart disease and other diseases of the circulatory system: Secondary | ICD-10-CM

## 2022-06-24 DIAGNOSIS — B379 Candidiasis, unspecified: Secondary | ICD-10-CM | POA: Diagnosis present

## 2022-06-24 DIAGNOSIS — R202 Paresthesia of skin: Secondary | ICD-10-CM

## 2022-06-24 DIAGNOSIS — E878 Other disorders of electrolyte and fluid balance, not elsewhere classified: Secondary | ICD-10-CM

## 2022-06-24 DIAGNOSIS — R200 Anesthesia of skin: Secondary | ICD-10-CM | POA: Diagnosis present

## 2022-06-24 DIAGNOSIS — Z79899 Other long term (current) drug therapy: Secondary | ICD-10-CM

## 2022-06-24 DIAGNOSIS — A419 Sepsis, unspecified organism: Secondary | ICD-10-CM | POA: Diagnosis not present

## 2022-06-24 DIAGNOSIS — R188 Other ascites: Secondary | ICD-10-CM | POA: Diagnosis present

## 2022-06-24 DIAGNOSIS — E8770 Fluid overload, unspecified: Secondary | ICD-10-CM | POA: Diagnosis present

## 2022-06-24 DIAGNOSIS — K631 Perforation of intestine (nontraumatic): Secondary | ICD-10-CM | POA: Insufficient documentation

## 2022-06-24 DIAGNOSIS — Z8711 Personal history of peptic ulcer disease: Secondary | ICD-10-CM

## 2022-06-24 DIAGNOSIS — Z5331 Laparoscopic surgical procedure converted to open procedure: Secondary | ICD-10-CM

## 2022-06-24 LAB — COMPREHENSIVE METABOLIC PANEL
ALT: 22 U/L (ref 0–44)
AST: 19 U/L (ref 15–41)
Albumin: 3.2 g/dL — ABNORMAL LOW (ref 3.5–5.0)
Alkaline Phosphatase: 82 U/L (ref 38–126)
Anion gap: 13 (ref 5–15)
BUN: 21 mg/dL — ABNORMAL HIGH (ref 6–20)
CO2: 26 mmol/L (ref 22–32)
Calcium: 9.3 mg/dL (ref 8.9–10.3)
Chloride: 90 mmol/L — ABNORMAL LOW (ref 98–111)
Creatinine, Ser: 0.81 mg/dL (ref 0.61–1.24)
GFR, Estimated: >60 mL/min (ref >60)
Glucose, Bld: 113 mg/dL — ABNORMAL HIGH (ref 70–99)
Potassium: 4 mmol/L (ref 3.5–5.1)
Sodium: 129 mmol/L — ABNORMAL LOW (ref 135–145)
Total Bilirubin: 0.5 mg/dL (ref 0.3–1.2)
Total Protein: 7 g/dL (ref 6.5–8.1)

## 2022-06-24 LAB — CBC WITH DIFFERENTIAL/PLATELET
Abs Immature Granulocytes: 0.02 10*3/uL (ref 0.00–0.07)
Basophils Absolute: 0 10*3/uL (ref 0.0–0.1)
Basophils Relative: 0 %
Eosinophils Absolute: 0.1 10*3/uL (ref 0.0–0.5)
Eosinophils Relative: 1 %
HCT: 32.8 % — ABNORMAL LOW (ref 39.0–52.0)
Hemoglobin: 11.7 g/dL — ABNORMAL LOW (ref 13.0–17.0)
Immature Granulocytes: 0 %
Lymphocytes Relative: 22 %
Lymphs Abs: 1.8 10*3/uL (ref 0.7–4.0)
MCH: 29.6 pg (ref 26.0–34.0)
MCHC: 35.7 g/dL (ref 30.0–36.0)
MCV: 83 fL (ref 80.0–100.0)
Monocytes Absolute: 0.9 10*3/uL (ref 0.1–1.0)
Monocytes Relative: 11 %
Neutro Abs: 5.2 10*3/uL (ref 1.7–7.7)
Neutrophils Relative %: 66 %
Platelets: 590 10*3/uL — ABNORMAL HIGH (ref 150–400)
RBC: 3.95 MIL/uL — ABNORMAL LOW (ref 4.22–5.81)
RDW: 13.9 % (ref 11.5–15.5)
WBC: 7.9 10*3/uL (ref 4.0–10.5)
nRBC: 0 % (ref 0.0–0.2)

## 2022-06-24 LAB — VITAMIN B12: Vitamin B-12: 728 pg/mL (ref 180–914)

## 2022-06-24 LAB — HEMOGLOBIN A1C
Hgb A1c MFr Bld: 5.8 % — ABNORMAL HIGH (ref 4.8–5.6)
Mean Plasma Glucose: 119.76 mg/dL

## 2022-06-24 LAB — FOLATE: Folate: 15.9 ng/mL (ref >5.9)

## 2022-06-24 LAB — LIPASE, BLOOD: Lipase: 30 U/L (ref 11–51)

## 2022-06-24 LAB — TSH: TSH: 1.812 u[IU]/mL (ref 0.350–4.500)

## 2022-06-24 MED ORDER — HYDROMORPHONE HCL 1 MG/ML IJ SOLN
1.0000 mg | Freq: Once | INTRAMUSCULAR | Status: AC
  Administered 2022-06-24: 1 mg via INTRAVENOUS
  Filled 2022-06-24: qty 1

## 2022-06-24 MED ORDER — GADOBUTROL 1 MMOL/ML IV SOLN
5.0000 mL | Freq: Once | INTRAVENOUS | Status: AC | PRN
  Administered 2022-06-24: 5 mL via INTRAVENOUS

## 2022-06-24 MED ORDER — OXYCODONE-ACETAMINOPHEN 7.5-325 MG PO TABS
1.0000 | ORAL_TABLET | Freq: Four times a day (QID) | ORAL | Status: AC | PRN
  Administered 2022-06-24 – 2022-06-25 (×2): 1 via ORAL
  Filled 2022-06-24 (×2): qty 1

## 2022-06-24 MED ORDER — SODIUM CHLORIDE 0.9 % IV BOLUS
1000.0000 mL | Freq: Once | INTRAVENOUS | Status: AC
  Administered 2022-06-24: 1000 mL via INTRAVENOUS

## 2022-06-24 MED ORDER — ONDANSETRON HCL 4 MG/2ML IJ SOLN
4.0000 mg | Freq: Once | INTRAMUSCULAR | Status: AC
  Administered 2022-06-24: 4 mg via INTRAVENOUS
  Filled 2022-06-24: qty 2

## 2022-06-24 MED ORDER — ACETAMINOPHEN 650 MG RE SUPP: 650.0000 mg | Freq: Four times a day (QID) | RECTAL | Status: DC | PRN

## 2022-06-24 MED ORDER — SODIUM CHLORIDE 0.9 % IV SOLN: INTRAVENOUS | Status: AC

## 2022-06-24 MED ORDER — ACETAMINOPHEN 325 MG PO TABS: 650.0000 mg | ORAL_TABLET | Freq: Four times a day (QID) | ORAL | Status: DC | PRN

## 2022-06-24 NOTE — ED Triage Notes (Signed)
Pt arrived POV from home c/o abdominal pain and not being able to eat. Pt states he has gall stones and needs emergency surgery to get his gallbladder out. Pt states he tried going to [place] [facility] and they couldn't do it till march.

## 2022-06-24 NOTE — ED Notes (Signed)
CT and MRI needed before transport.

## 2022-06-24 NOTE — ED Notes (Signed)
Pt transported to CT ?

## 2022-06-24 NOTE — Consult Note (Signed)
[facility] Surgery Consult Note  Darreyl Cazes [facility] Apr 14, 1986  [id number].    Requesting MD: Charlesetta Shanks Chief Complaint/Reason for Consult: gallstones  HPI:  Gabriel Dalton is a 37 y.o. male with h/o chronic abdominal pain and weight loss who presented to [facility] today complaining of ongoing abdominal pain. Patient is followed by [facility]. He has had extensive work up over the last 3 years with no clear source of his symptoms. Work up has included multiple CT scans, xray, MRI, u/s, gastric emptying study. Most recent endoscopic evaluation in August 2023 revealed esophagitis with no bleeding, gastritis (biopsy: mild nonspecific reactive gastropathy), normal duodenum; erythematous mucosa in the entire colon (biopsy: negative for acute inflammation, features of chronicity, granulomas or dysplasia). He had a positive fecal occult blood 05/31/22 which was attributed to hemorrhoids.   States that his current pain is diffuse and worsens right after he eats. He is afraid of eating food because of this pain. Reports sharp stabbing pain. Denies nausea and vomiting. He has lost 60 pounds since this problem started 3 years ago. He does state that his symptoms have been somewhat different over the last 4 weeks: he reports increased abdominal pain, worse with eating, and decreased frequency of bowel movements. He does state this feels different than when he had ulcer disease and has not had similar upper abdominal pain, like he did with ulcers, since his upper endoscopy in August which was negative for gastric ulcer. Denies melena or hematochezia.  ED work up today included RUQ u/s that showed cholelithiasis without signs of acute cholecystitis. WBC, LFTs, lipase WNL. He is more anemic with hemoglobin 11.7 compared to 14 from 3 weeks ago.  Patient was seen in our office 06/19/22 by Dr. Thermon Leyland to discuss cholecystectomy given that he has gallstones. Although only some  of his symptoms were typical for symptomatic cholelithiasis, since he has had extensive evaluation of his abdominal complaints with no clear answer as to why he is having pain, a cholecystectomy was offered with the knowledge that he could continue to have issues after surgery. This has not yet been scheduled.  Abdominal surgical history: none Anticoagulants: none Smokes 1/2 PPD Admits to [facility] use 2-3x per week Employment: works for [employer] (lifts up to 100 lbs)  His mother is at the bedside.  Family History  Problem Relation Age of Onset   Healthy Mother    Hypertension Father    Colon cancer Maternal Uncle    Rectal cancer Maternal Uncle    Brain cancer Maternal Uncle    Heart disease Maternal Aunt    Esophageal cancer Neg Hx    Stomach cancer Neg Hx     Past Medical History:  Diagnosis Date   Bronchitis    Multiple gastric ulcers     Past Surgical History:  Procedure Laterality Date   COLONOSCOPY     I & D EXTREMITY  10/18/2011   Procedure: IRRIGATION AND DEBRIDEMENT EXTREMITY;  Surgeon: Roseanne Kaufman, MD;  Location: [place];  Service: Orthopedics;  Laterality: Right;  Irrigation and Debridement  Right Middle Finger; Removal of foreign body    Social History:  reports that he has been smoking cigarettes. He has been smoking an average of 1 pack per day. He has never used smokeless tobacco. He reports that he does not currently use alcohol. He reports that he does not currently use drugs. Frequency: 3.00 times per week.  Allergies:  Allergies  Allergen Reactions   Aspirin  Cannot take due to stomach ulcers    (Not in a hospital admission)   Prior to Admission medications   Medication Sig Start Date End Date Taking? Authorizing Provider  dicyclomine (BENTYL) 20 MG tablet Take 1 tablet (20 mg total) by mouth 2 (two) times daily. 05/28/22   Zehr, Laban Emperor, PA-C  linaclotide (LINZESS) 145 MCG CAPS capsule Take 1 capsule (145 mcg total) by mouth daily  before breakfast. 04/23/22   Zehr, Laban Emperor, PA-C  oxyCODONE-acetaminophen (PERCOCET/ROXICET) 5-325 MG tablet Take 1 tablet by mouth every 6 (six) hours as needed for severe pain. 05/18/22   Rex Kras, PA  pantoprazole (PROTONIX) 40 MG tablet Take 1 tablet (40 mg total) by mouth 2 (two) times daily. 04/23/22   Zehr, Laban Emperor, PA-C  promethazine (PHENERGAN) 25 MG tablet Take 1 tablet (25 mg total) by mouth every 6 (six) hours as needed for nausea or vomiting. 05/18/22   Rex Kras, PA  sucralfate (CARAFATE) 1 g tablet Take 1 tablet (1 g total) by mouth 4 (four) times daily -  with meals and at bedtime. 05/28/22   Zehr, Laban Emperor, PA-C    Blood pressure 132/89, pulse (!) 120, temperature 99.1 F (37.3 C), resp. rate 18, height 5' 8"$  (1.727 m), weight 49 kg, SpO2 99 %. Physical Exam: General: pleasant, WD/WN male who is laying in bed in NAD HEENT: head is normocephalic, atraumatic.  Sclera are noninjected.  Pupils equal and round.  Ears and nose without any masses or lesions. Heart: regular, rate, and rhythm.  Normal s1,s2. No obvious murmurs, gallops, or rubs noted.  Palpable radial and pedal pulses bilaterally  Lungs: CTAB,  Respiratory effort nonlabored Abd: soft, +BS in all 4 quadrants. Diffusely tender, worse over LLQ with mild guarding, negative murphy's sign. No rebound tenderness, no hernias MS: no BUE/BLE edema, calves soft and nontender Skin: warm and dry with no masses, lesions, or rashes Psych: A&Ox4 with an appropriate affect Neuro: MAEs, no gross motor or sensory deficits BUE/BLE  Results for orders placed or performed during the hospital encounter of 06/24/22 (from the past 48 hour(s))  CBC with Differential     Status: Abnormal   Collection Time: 06/24/22  2:00 PM  Result Value Ref Range   WBC 7.9 4.0 - 10.5 K/uL   RBC 3.95 (L) 4.22 - 5.81 MIL/uL   Hemoglobin 11.7 (L) 13.0 - 17.0 g/dL   HCT 32.8 (L) 39.0 - 52.0 %   MCV 83.0 80.0 - 100.0 fL   MCH 29.6 26.0 - 34.0 pg   MCHC 35.7 30.0  - 36.0 g/dL   RDW 13.9 11.5 - 15.5 %   Platelets 590 (H) 150 - 400 K/uL   nRBC 0.0 0.0 - 0.2 %   Neutrophils Relative % 66 %   Neutro Abs 5.2 1.7 - 7.7 K/uL   Lymphocytes Relative 22 %   Lymphs Abs 1.8 0.7 - 4.0 K/uL   Monocytes Relative 11 %   Monocytes Absolute 0.9 0.1 - 1.0 K/uL   Eosinophils Relative 1 %   Eosinophils Absolute 0.1 0.0 - 0.5 K/uL   Basophils Relative 0 %   Basophils Absolute 0.0 0.0 - 0.1 K/uL   Immature Granulocytes 0 %   Abs Immature Granulocytes 0.02 0.00 - 0.07 K/uL    Comment: Performed at [facility], [address] [place], [place] [address]  Comprehensive metabolic panel     Status: Abnormal   Collection Time: 06/24/22  2:00 PM  Result Value  Ref Range   Sodium 129 (L) 135 - 145 mmol/L   Potassium 4.0 3.5 - 5.1 mmol/L   Chloride 90 (L) 98 - 111 mmol/L   CO2 26 22 - 32 mmol/L   Glucose, Bld 113 (H) 70 - 99 mg/dL    Comment: Glucose reference range applies only to samples taken after fasting for at least 8 hours.   BUN 21 (H) 6 - 20 mg/dL   Creatinine, Ser 0.81 0.61 - 1.24 mg/dL   Calcium 9.3 8.9 - 10.3 mg/dL   Total Protein 7.0 6.5 - 8.1 g/dL   Albumin 3.2 (L) 3.5 - 5.0 g/dL   AST 19 15 - 41 U/L   ALT 22 0 - 44 U/L   Alkaline Phosphatase 82 38 - 126 U/L   Total Bilirubin 0.5 0.3 - 1.2 mg/dL   GFR, Estimated >60 >60 mL/min    Comment: (NOTE) Calculated using the CKD-EPI Creatinine Equation (2021)    Anion gap 13 5 - 15    Comment: Performed at [facility] [address], [place], [place] [address]  Lipase, blood     Status: None   Collection Time: 06/24/22  2:00 PM  Result Value Ref Range   Lipase 30 11 - 51 U/L    Comment: Performed at [facility], [place] [address], [place], [place] [address]   US Abdomen Limited RUQ (LIVER/GB)  Result Date: 06/24/2022 CLINICAL DATA:  Right upper quadrant abdominal pain. EXAM: ULTRASOUND ABDOMEN LIMITED RIGHT UPPER QUADRANT COMPARISON:  June 03, 2022. FINDINGS: Gallbladder: Sludge and  probable cholelithiasis is noted within the gallbladder lumen. No gallbladder wall thickening or pericholecystic fluid is noted. No sonographic Murphy's sign is noted. Common bile duct: Diameter: 3 mm which is within normal limits. Liver: No focal lesion identified. Within normal limits in parenchymal echogenicity. Portal vein is patent on color Doppler imaging with normal direction of blood flow towards the liver. Other: None. IMPRESSION: Cholelithiasis and gallbladder sludge is noted without evidence of cholecystitis. Electronically Signed   By: Marijo Conception M.D.   On: 06/24/2022 15:11      Assessment/Plan Abdominal pain, poor PO intake, and weight loss Cholelithiasis - Patient with 3 years of chronic abdominal pain and extensive outpatient work up by [facility] gastroenterology without clear cause of his symptoms. Symptoms somewhat atypical for symptomatic cholelithiasis, but with gallstones and no other clear source of his symptoms I think it would be reasonable to offer a cholecystectomy at this point with the understanding that this may not resolve his symptoms. Patient is agreeable. He also as a recent FOBT+ stool which was attributed to hemorrhoids and worsening anemia which may be related to malnutrition but is also unclear. Recommend admission to the medical service. Admit to [facility] to be seen by Dr. Thermon Leyland for possible cholecystectomy.     I reviewed nursing notes, ED provider notes, last 24 h vitals and pain scores, last 48 h intake and output, last 24 h labs and trends, and last 24 h imaging results.   Obie Dredge, [facility] Surgery 06/24/2022, 3:45 PM Please see Amion for pager number during day hours 7:00am-4:30pm

## 2022-06-24 NOTE — Hospital Course (Signed)
36yom with of  chronic abdomen pain and weight loss presented to the ED for further evaluation.  He was being followed by gastroenterology lumbar as outpatient and has had extensive workup in past 3 years for similar presentation-he has had extensive workup including multiple CT scans, MRI, ultrasound, NM gastric emptying study, most recent endoscopy evaluation in August 2023 revealing esophagitis with no bleeding, gastritis and biopsy with mild nonspecific reactive gastropathy, he has had positive occult blood 05/31/2022 which was attributed to hemorrhoids.  He endorses about 60 pound weight loss since the symptoms started.  He was recently seen by general surgery on 06/19/2022 to discuss cholecystectomy given that he has a gallstone.  In the ED, vitals stable labs with hyponatremia, normal LFTs lipase CBC with hemoglobin at 11.7 g was 14 on 06/03/22. General surgery was consulted in the ED requesting admission to [facility] for possible cholecystectomy tomorrow. Ordered patient also complained of bilateral lower extremity numbness and somewhat decreased sensation more on the right leg than the left, he reports he may have slightly weaker right leg than the left, this has not going on for few months, has been using cane to get around.  In the ED CT brain and MRI lumbar spine ordered result pending

## 2022-06-24 NOTE — ED Provider Notes (Signed)
[facility] Provider Note   CSN: [id number] Arrival date & time: 06/24/22  1322     History  Chief Complaint  Patient presents with   Abdominal Pain    Gabriel Dalton is a 37 y.o. male with a past medical history significant for chronic abdominal pain, history of significant weight loss, history of gastric ulcers, GERD who presents to the ED due to persistent abdominal pain that has been present for the past 3 years.  Patient states he has been unable to eat given significant amount of pain which is exacerbated by p.o. intake.  He notes he has lost a significant amount of weight (165>108) over the past 3 years.  Patient notes abdominal pain is localized throughout his abdomen worse in the upper quadrants.  Admits to some nausea and belching.  Patient was evaluated by general surgery on 2/8 where his symptoms were thought to be both typical and atypical for biliary colic.  It was discussed with patient that they can perform a laparoscopic cholecystectomy in hopes it would relieve his pain however, given the atypical features the surgeon was unsure whether or not this was solely related to his gallbladder.  Patient has had numerous CT scans over the past few months which have either been unremarkable or showed sludge/stones in the gallbladder. He also admits to difficulties urinating which he attributes to an enlarged prostate and notes he is currently taking medicine which has helped with his symptoms. No history of cancer.  Patient also admits to bilateral lower extremity numbness/tingling.  He notes he now has to ambulate with a cane to prevent him from falling.  He notes this is been ongoing for numerous months. Endorses weakness to bilateral lower extremities right>left.  Admits to chronic low back pain.  Denies IV drug use.  No fever or chills. No known trauma.   History obtained from patient and past medical records. No interpreter  used during encounter.       Home Medications Prior to Admission medications   Medication Sig Start Date End Date Taking? Authorizing Provider  dicyclomine (BENTYL) 20 MG tablet Take 1 tablet (20 mg total) by mouth 2 (two) times daily. 05/28/22   Zehr, Laban Emperor, PA-C  linaclotide (LINZESS) 145 MCG CAPS capsule Take 1 capsule (145 mcg total) by mouth daily before breakfast. 04/23/22   Zehr, Laban Emperor, PA-C  oxyCODONE-acetaminophen (PERCOCET/ROXICET) 5-325 MG tablet Take 1 tablet by mouth every 6 (six) hours as needed for severe pain. 05/18/22   Rex Kras, PA  pantoprazole (PROTONIX) 40 MG tablet Take 1 tablet (40 mg total) by mouth 2 (two) times daily. 04/23/22   Zehr, Laban Emperor, PA-C  promethazine (PHENERGAN) 25 MG tablet Take 1 tablet (25 mg total) by mouth every 6 (six) hours as needed for nausea or vomiting. 05/18/22   Rex Kras, PA  sucralfate (CARAFATE) 1 g tablet Take 1 tablet (1 g total) by mouth 4 (four) times daily -  with meals and at bedtime. 05/28/22   Zehr, Laban Emperor, PA-C      Allergies    Aspirin    Review of Systems   Review of Systems  Constitutional:  Negative for chills and fever.  Respiratory:  Negative for shortness of breath.   Cardiovascular:  Negative for chest pain.  Gastrointestinal:  Positive for abdominal pain and nausea. Negative for diarrhea and vomiting.  Musculoskeletal:  Positive for back pain and gait problem.  Neurological:  Positive for weakness  and numbness.    Physical Exam Updated Vital Signs BP 115/79   Pulse 85   Temp 99.1 F (37.3 C)   Resp 18   Ht 5' 8"$  (1.727 m)   Wt 49 kg   SpO2 100%   BMI 16.42 kg/m  Physical Exam Vitals and nursing note reviewed.  Constitutional:      General: He is not in acute distress.    Appearance: He is not ill-appearing.     Comments: Appears uncomfortable in bed. Very slender individual  HENT:     Head: Normocephalic.  Eyes:     Pupils: Pupils are equal, round, and reactive to light.  Cardiovascular:      Rate and Rhythm: Normal rate and regular rhythm.     Pulses: Normal pulses.     Heart sounds: Normal heart sounds. No murmur heard.    No friction rub. No gallop.  Pulmonary:     Effort: Pulmonary effort is normal.     Breath sounds: Normal breath sounds.  Abdominal:     General: Abdomen is flat. There is no distension.     Palpations: Abdomen is soft.     Tenderness: There is abdominal tenderness. There is guarding. There is no rebound.     Comments: Diffuse tenderness with voluntary guarding.  Musculoskeletal:        General: Normal range of motion.     Cervical back: Neck supple.     Comments: Bilateral lower extremities neurovascularly intact. Slight decreased strength of RLE compared to LLE.  Skin:    General: Skin is warm and dry.  Neurological:     General: No focal deficit present.     Mental Status: He is alert.  Psychiatric:        Mood and Affect: Mood normal.        Behavior: Behavior normal.     ED Results / Procedures / Treatments   Labs (all labs ordered are listed, but only abnormal results are displayed) Labs Reviewed  CBC WITH DIFFERENTIAL/PLATELET - Abnormal; Notable for the following components:      Result Value   RBC 3.95 (*)    Hemoglobin 11.7 (*)    HCT 32.8 (*)    Platelets 590 (*)    All other components within normal limits  COMPREHENSIVE METABOLIC PANEL - Abnormal; Notable for the following components:   Sodium 129 (*)    Chloride 90 (*)    Glucose, Bld 113 (*)    BUN 21 (*)    Albumin 3.2 (*)    All other components within normal limits  LIPASE, BLOOD  URINALYSIS, ROUTINE W REFLEX MICROSCOPIC    EKG None  Radiology US Abdomen Limited RUQ (LIVER/GB)  Result Date: 06/24/2022 CLINICAL DATA:  Right upper quadrant abdominal pain. EXAM: ULTRASOUND ABDOMEN LIMITED RIGHT UPPER QUADRANT COMPARISON:  June 03, 2022. FINDINGS: Gallbladder: Sludge and probable cholelithiasis is noted within the gallbladder lumen. No gallbladder wall  thickening or pericholecystic fluid is noted. No sonographic Murphy's sign is noted. Common bile duct: Diameter: 3 mm which is within normal limits. Liver: No focal lesion identified. Within normal limits in parenchymal echogenicity. Portal vein is patent on color Doppler imaging with normal direction of blood flow towards the liver. Other: None. IMPRESSION: Cholelithiasis and gallbladder sludge is noted without evidence of cholecystitis. Electronically Signed   By: Marijo Conception M.D.   On: 06/24/2022 15:11    Procedures Procedures    Medications Ordered in ED Medications  sodium chloride  0.9 % bolus 1,000 mL (1,000 mLs Intravenous New Bag/Given 06/24/22 1612)  HYDROmorphone (DILAUDID) injection 1 mg (1 mg Intravenous Given 06/24/22 1612)  ondansetron (ZOFRAN) injection 4 mg (4 mg Intravenous Given 06/24/22 1612)    ED Course/ Medical Decision Making/ A&P Clinical Course as of 06/24/22 1732  Tue Jun 24, 2022  1509 Sodium(!): 129 [CA]  1510 Platelets(!): 590 [CA]  1510 Hemoglobin(!): 11.7 [CA]  1534 Spoke to [place] with general surgery who will evaluate patient at bedside. [CA]    Clinical Course User Index [CA] Suzy Bouchard, PA-C                             Medical Decision Making Amount and/or Complexity of Data Reviewed Independent Historian: parent External Data Reviewed: notes.    Details: General surgery note Labs: ordered. Decision-making details documented in ED Course. Radiology: ordered and independent interpretation performed. Decision-making details documented in ED Course. ECG/medicine tests: ordered and independent interpretation performed. Decision-making details documented in ED Course.  Risk Prescription drug management. Decision regarding hospitalization.   This patient presents to the ED for concern of abdominal pain/ numbness/tingling, this involves an extensive number of treatment options, and is a complaint that carries with it a high risk of  complications and morbidity.  The differential diagnosis includes acute cholecystitis, PUD, GERD, pancreatitis, cauda equina, compression fracture, electrolyte abnormalities, CVA, cancer, etc  37 year old male presents to the ED due to persistent abdominal pain x 3 years associated with significant weight loss and decreased p.o. intake.  Patient evaluated by general surgery on 2/8 who recommended laparoscopic cholecystectomy.  Patient has had numerous ultrasounds and CT scans which were either unremarkable or showed sludge/ stone in the gallbladder. He also admits to bilateral lower extremity numbness/tingling.  Admits to chronic low back pain.  No trauma.  Denies IV drug use.  No fever.  Denies urinary incontinence; however notes he has been having difficulties urinating which he attributes to enlarged gallbladder. No saddle anesthesia.  Upon arrival, patient patient tachycardic at 120 with otherwise unremarkable vitals.  Heart rate improved during initial evaluation.  Physical exam significant for diffuse abdominal tenderness with voluntary guarding.  Slight decrease in strength of right lower extremity compared to left.  Pulses intact bilaterally.  Routine labs ordered.  Will hold off on CT abdomen given patient has had numerous CT scans in the past few months.  Ultrasound ordered in triage which I personally reviewed and interpreted which demonstrates sludge and gallstones without evidence of acute cholecystitis.  Given persistent pain will discuss with general surgery.  Dilaudid, Zofran, and IV fluids given.   CBC without a leukocytosis.  Mild anemia with hemoglobin 11.7.  Thrombocytosis at 590.  CMP significant for hyponatremia 129.  Normal renal function.  Lipase normal at 30.  Doubt pancreatitis.  EKG demonstrates normal sinus rhythm.  No signs of acute ischemia.  Low suspicion for atypical ACS.  Unclear etiology of lower extremity numbness/tingling.  Given this has been persistent for numerous months if  not years per chart review lower suspicion for cauda equina or central cord compression.  Will discuss with hospitalist about further imaging.  No electrolyte abnormalities that could be causing his numbness/tingling.  Lower suspicion for CVA.  Discussed with Opal Sidles with general surgery who recommends admit to medicine and transfer to [facility] for cholecystomy tomorrow AM.   5:15 PM Discussed with Dr. Lupita Leash with [facility] who agrees to admit. He recommends  CT head and MRI lumbar spine due to numbness/tingling of lower extremities and weakness. Orders placed.   Has PCP Hx. Chronic abdominal pain       Final Clinical Impression(s) / ED Diagnoses Final diagnoses:  Pain of upper abdomen  Gallstones  Numbness and tingling    Rx / DC Orders ED Discharge Orders     None         Karie Kirks 06/24/22 2116    Charlesetta Shanks, MD 06/25/22 2104

## 2022-06-24 NOTE — ED Provider Triage Note (Signed)
Emergency Medicine Provider Triage Evaluation Note  Gabriel Dalton , a 37 y.o. male  was evaluated in triage.  Pt complains of constant right upper quadrant pain.  Has been evaluated with ultrasound 1 to 2 months ago, told he has gallstones without evidence of infection.  Patient states the pain is continued to worsen and cannot control the pain anymore.  Denies recent fevers.  Reports less bowel movements and decreased food intake.  Constant nausea.  Unable to see GI to have his gallbladder removed until March.  Review of Systems  Positive:  Negative: See above  Physical Exam  BP 132/89 (BP Location: Right Arm)   Pulse (!) 120   Temp 99.1 F (37.3 C)   Resp 18   Ht 5' 8"$  (1.727 m)   Wt 49 kg   SpO2 99%   BMI 16.42 kg/m  Gen:   Awake, no distress   Resp:  Normal effort  MSK:   Moves extremities without difficulty  Other:  RUQ tenderness.  Abdomen soft, nondistended.  Sitting comfortably.  Not diaphoretic.  Medical Decision Making  Medically screening exam initiated at 1:58 PM.  Appropriate orders placed.  Gabriel Dalton was informed that the remainder of the evaluation will be completed by another provider, this initial triage assessment does not replace that evaluation, and the importance of remaining in the ED until their evaluation is complete.     Prince Rome, PA-C [id number] [phone]

## 2022-06-24 NOTE — H&P (Signed)
History and Physical    Gabriel Dalton [id number] DOB: January 02, 1986 DOA: 06/24/2022  PCP: Marrian Salvage, FNP   Patient coming from: home  Chief Complaint  Patient presents with   Abdominal Pain      HPI: 36yom with of  chronic abdomen pain and weight loss presented to the ED for further evaluation.  He was being followed by gastroenterology lumbar as outpatient and has had extensive workup in past 3 years for similar presentation-he has had extensive workup including multiple CT scans, MRI, ultrasound, NM gastric emptying study, most recent endoscopy evaluation in August 2023 revealing esophagitis with no bleeding, gastritis and biopsy with mild nonspecific reactive gastropathy, he has had positive occult blood 05/31/2022 which was attributed to hemorrhoids.  He endorses about 60 pound weight loss since the symptoms started.  He was recently seen by general surgery on 06/19/2022 to discuss cholecystectomy given that he has a gallstone.  In the ED, vitals stable labs with hyponatremia, normal LFTs lipase CBC with hemoglobin at 11.7 g was 14 on 06/03/22. General surgery was consulted in the ED requesting admission to [facility] for possible cholecystectomy tomorrow. Ordered patient also complained of bilateral lower extremity numbness and somewhat decreased sensation more on the right leg than the left, he reports he may have slightly weaker right leg than the left, this has not going on for few months, has been using cane to get around.  In the ED CT brain and MRI lumbar spine ordered result pending   Assessment/Plan Principal Problem:   Cholelithiasis  Chronic abdominal pain x 3 years Cholelithiasis: Had an extensive workup as above unremarkable (CT abdomen pelvis, EGD, and epigastric empty study, ultrasound, by [facility]) except for cholelithiasis likely symptomatic.  Seen by general surgery and has offered cholecystectomy advised along tomorrow. Anticipate  patient will be transferred to surgery service post op.  Significant weight loss Severe protein calorie malnutrition: Dietitian consulted.  Bilateral lower extremity numbness/diminished sensation on RLE/Generalized weakness: Likely in the setting of significant weight loss, possible vitamin deficiency, ruling out other etiology, pending CT head MRI spine in ED. Patient has equal strength in bilateral lower extremity, some diminished sensation more on RLE- going on for months, using cane. Will check TSH/B12/thiamine/folate.  Body mass index is 16.42 kg/m.   Severity of Illness: The appropriate patient status for this patient is OBSERVATION. Observation status is judged to be reasonable and necessary in order to provide the required intensity of service to ensure the patient's safety. The patient's presenting symptoms, physical exam findings, and initial radiographic and laboratory data in the context of their medical condition is felt to place them at decreased risk for further clinical deterioration. Furthermore, it is anticipated that the patient will be medically stable for discharge from the hospital within 2 midnights of admission.    DVT prophylaxis: SCDs Start: 06/24/22 1743 Code Status:   Code Status: Full Code  Family Communication: Admission, patients condition and plan of care including tests being ordered have been discussed with the patient and family who indicate understanding and agree with the plan and Code Status.  Consults called:  CCS  Review of Systems: All systems were reviewed and were negative except as mentioned in HPI above. Negative for fever Negative for chest pain Negative for shortness of breath  Past Medical History:  Diagnosis Date   Bronchitis    Multiple gastric ulcers     Past Surgical History:  Procedure Laterality Date   COLONOSCOPY  I & D EXTREMITY  10/18/2011   Procedure: IRRIGATION AND DEBRIDEMENT EXTREMITY;  Surgeon: Roseanne Kaufman, MD;   Location: [facility];  Service: Orthopedics;  Laterality: Right;  Irrigation and Debridement  Right Middle Finger; Removal of foreign body     reports that he has been smoking cigarettes. He has been smoking an average of 1 pack per day. He has never used smokeless tobacco. He reports that he does not currently use alcohol. He reports that he does not currently use drugs. Frequency: 3.00 times per week.  Allergies  Allergen Reactions   Aspirin     Cannot take due to stomach ulcers    Family History  Problem Relation Age of Onset   Healthy Mother    Hypertension Father    Colon cancer Maternal Uncle    Rectal cancer Maternal Uncle    Brain cancer Maternal Uncle    Heart disease Maternal Aunt    Esophageal cancer Neg Hx    Stomach cancer Neg Hx      Prior to Admission medications   Medication Sig Start Date End Date Taking? Authorizing Provider  dicyclomine (BENTYL) 20 MG tablet Take 1 tablet (20 mg total) by mouth 2 (two) times daily. 05/28/22   Zehr, Laban Emperor, PA-C  linaclotide (LINZESS) 145 MCG CAPS capsule Take 1 capsule (145 mcg total) by mouth daily before breakfast. 04/23/22   Zehr, Laban Emperor, PA-C  oxyCODONE-acetaminophen (PERCOCET/ROXICET) 5-325 MG tablet Take 1 tablet by mouth every 6 (six) hours as needed for severe pain. 05/18/22   Rex Kras, PA  pantoprazole (PROTONIX) 40 MG tablet Take 1 tablet (40 mg total) by mouth 2 (two) times daily. 04/23/22   Zehr, Laban Emperor, PA-C  promethazine (PHENERGAN) 25 MG tablet Take 1 tablet (25 mg total) by mouth every 6 (six) hours as needed for nausea or vomiting. 05/18/22   Rex Kras, PA  sucralfate (CARAFATE) 1 g tablet Take 1 tablet (1 g total) by mouth 4 (four) times daily -  with meals and at bedtime. 05/28/22   Loralie Champagne, PA-C    Physical Exam: Vitals:   06/24/22 1326 06/24/22 1357 06/24/22 1659  BP: 132/89  115/79  Pulse: (!) 120  85  Resp: 18  18  Temp: 99.1 F (37.3 C)    SpO2: 99%  100%  Weight:  49 kg   Height:  5' 8"$  (1.727  m)     General exam: AAOx3,weak appearing. HEENT:Oral mucosa moist, Ear/Nose WNL grossly, dentition normal. Respiratory system: bilaterally clear,no wheezing or crackles,no use of accessory muscle Cardiovascular system: S1 & S2 +, No JVD,. Gastrointestinal system: Abdomen soft, generalized tenderness present, scaphoid abdomen, nondistended,BS+ Nervous System:Alert, awake, moving extremities and grossly nonfocal, slightly diminished sensation on the right lower extremity  compared to left Extremities: No edema, distal peripheral pulses palpable.  Skin: No rashes,no icterus. MSK: thin muscle bulk,tone, power   Labs on Admission: I have personally reviewed following labs and imaging studies  CBC: Recent Labs  Lab 06/24/22 1400  WBC 7.9  NEUTROABS 5.2  HGB 11.7*  HCT 32.8*  MCV 83.0  PLT [id number]*   Basic Metabolic Panel: Recent Labs  Lab 06/24/22 1400  NA 129*  K 4.0  CL 90*  CO2 26  GLUCOSE 113*  BUN 21*  CREATININE 0.81  CALCIUM 9.3   GFR: Estimated Creatinine Clearance: 88.2 mL/min (by C-G formula based on SCr of 0.81 mg/dL). Liver Function Tests: Recent Labs  Lab 06/24/22 1400  AST 19  ALT  22  ALKPHOS 82  BILITOT 0.5  PROT 7.0  ALBUMIN 3.2*   Recent Labs  Lab 06/24/22 1400  LIPASE 30   No results for input(s): "AMMONIA" in the last 168 hours. Coagulation Profile: No results for input(s): "INR", "PROTIME" in the last 168 hours. Cardiac Enzymes: No results for input(s): "CKTOTAL", "CKMB", "CKMBINDEX", "TROPONINI" in the last 168 hours. BNP (last 3 results) No results for input(s): "PROBNP" in the last 8760 hours. HbA1C: No results for input(s): "HGBA1C" in the last 72 hours. CBG: No results for input(s): "GLUCAP" in the last 168 hours. Lipid Profile: No results for input(s): "CHOL", "HDL", "LDLCALC", "TRIG", "CHOLHDL", "LDLDIRECT" in the last 72 hours. Thyroid Function Tests: No results for input(s): "TSH", "T4TOTAL", "FREET4", "T3FREE", "THYROIDAB" in  the last 72 hours. Anemia Panel: No results for input(s): "VITAMINB12", "FOLATE", "FERRITIN", "TIBC", "IRON", "RETICCTPCT" in the last 72 hours. Urine analysis:    Component Value Date/Time   COLORURINE YELLOW 05/31/2022 1306   APPEARANCEUR CLEAR 05/31/2022 1306   LABSPEC 1.031 (H) 05/31/2022 1306   PHURINE 5.5 05/31/2022 1306   GLUCOSEU NEGATIVE 05/31/2022 1306   HGBUR NEGATIVE 05/31/2022 1306   BILIRUBINUR NEGATIVE 05/31/2022 1306   KETONESUR TRACE (A) 05/31/2022 1306   PROTEINUR 30 (A) 05/31/2022 1306   UROBILINOGEN 0.2 10/10/2009 1411   NITRITE NEGATIVE 05/31/2022 1306   LEUKOCYTESUR NEGATIVE 05/31/2022 1306    Radiological Exams on Admission: US Abdomen Limited RUQ (LIVER/GB)  Result Date: 06/24/2022 CLINICAL DATA:  Right upper quadrant abdominal pain. EXAM: ULTRASOUND ABDOMEN LIMITED RIGHT UPPER QUADRANT COMPARISON:  June 03, 2022. FINDINGS: Gallbladder: Sludge and probable cholelithiasis is noted within the gallbladder lumen. No gallbladder wall thickening or pericholecystic fluid is noted. No sonographic Murphy's sign is noted. Common bile duct: Diameter: 3 mm which is within normal limits. Liver: No focal lesion identified. Within normal limits in parenchymal echogenicity. Portal vein is patent on color Doppler imaging with normal direction of blood flow towards the liver. Other: None. IMPRESSION: Cholelithiasis and gallbladder sludge is noted without evidence of cholecystitis. Electronically Signed   By: Marijo Conception M.D.   On: 06/24/2022 15:11      Antonieta Pert MD [facility]  If 7PM-7AM, please contact night-coverage [email or web address]  06/24/2022, 5:43 PM

## 2022-06-24 NOTE — ED Notes (Signed)
Patient transported to MRI 

## 2022-06-24 NOTE — ED Notes (Signed)
Pt returned from MRI °

## 2022-06-25 ENCOUNTER — Observation Stay (HOSPITAL_COMMUNITY): Payer: 59

## 2022-06-25 ENCOUNTER — Other Ambulatory Visit: Payer: Self-pay

## 2022-06-25 ENCOUNTER — Encounter (HOSPITAL_COMMUNITY)
Admission: EM | Disposition: A | Discharge status: Home Health | Payer: Self-pay | Source: Home / Self Care | Attending: Internal Medicine

## 2022-06-25 ENCOUNTER — Observation Stay (HOSPITAL_COMMUNITY): Payer: 59 | Admitting: Anesthesiology

## 2022-06-25 ENCOUNTER — Encounter (HOSPITAL_COMMUNITY): Payer: Self-pay | Admitting: Internal Medicine

## 2022-06-25 ENCOUNTER — Observation Stay (HOSPITAL_BASED_OUTPATIENT_CLINIC_OR_DEPARTMENT_OTHER): Payer: 59 | Admitting: Anesthesiology

## 2022-06-25 DIAGNOSIS — K8020 Calculus of gallbladder without cholecystitis without obstruction: Secondary | ICD-10-CM | POA: Diagnosis not present

## 2022-06-25 DIAGNOSIS — E43 Unspecified severe protein-calorie malnutrition: Secondary | ICD-10-CM | POA: Insufficient documentation

## 2022-06-25 DIAGNOSIS — K631 Perforation of intestine (nontraumatic): Secondary | ICD-10-CM | POA: Diagnosis not present

## 2022-06-25 DIAGNOSIS — E871 Hypo-osmolality and hyponatremia: Secondary | ICD-10-CM | POA: Diagnosis not present

## 2022-06-25 DIAGNOSIS — Z978 Presence of other specified devices: Secondary | ICD-10-CM

## 2022-06-25 HISTORY — PX: CHOLECYSTECTOMY: SHX55

## 2022-06-25 LAB — URINALYSIS, ROUTINE W REFLEX MICROSCOPIC
Bilirubin Urine: NEGATIVE
Glucose, UA: NEGATIVE mg/dL
Hgb urine dipstick: NEGATIVE
Ketones, ur: 5 mg/dL — AB
Leukocytes,Ua: NEGATIVE
Nitrite: NEGATIVE
Protein, ur: 30 mg/dL — AB
Specific Gravity, Urine: 1.033 — ABNORMAL HIGH (ref 1.005–1.030)
pH: 5 (ref 5.0–8.0)

## 2022-06-25 LAB — BLOOD GAS, ARTERIAL
Acid-base deficit: 7.7 mmol/L — ABNORMAL HIGH (ref 0.0–2.0)
Bicarbonate: 19.3 mmol/L — ABNORMAL LOW (ref 20.0–28.0)
FIO2: 100 %
O2 Saturation: 95 %
PEEP: 5 cmH2O
Patient temperature: 36.7
RATE: 15 resp/min
pCO2 arterial: 43 mmHg (ref 32–48)
pH, Arterial: 7.25 — ABNORMAL LOW (ref 7.35–7.45)
pO2, Arterial: 68 mmHg — ABNORMAL LOW (ref 83–108)

## 2022-06-25 LAB — CBC
HCT: 32.8 % — ABNORMAL LOW (ref 39.0–52.0)
Hemoglobin: 11.1 g/dL — ABNORMAL LOW (ref 13.0–17.0)
MCH: 29.2 pg (ref 26.0–34.0)
MCHC: 33.8 g/dL (ref 30.0–36.0)
MCV: 86.3 fL (ref 80.0–100.0)
Platelets: 537 10*3/uL — ABNORMAL HIGH (ref 150–400)
RBC: 3.8 MIL/uL — ABNORMAL LOW (ref 4.22–5.81)
RDW: 14.5 % (ref 11.5–15.5)
WBC: 7.3 10*3/uL (ref 4.0–10.5)
nRBC: 0 % (ref 0.0–0.2)

## 2022-06-25 LAB — HIV ANTIBODY (ROUTINE TESTING W REFLEX): HIV Screen 4th Generation wRfx: NONREACTIVE

## 2022-06-25 SURGERY — LAPAROSCOPIC CHOLECYSTECTOMY
Anesthesia: General

## 2022-06-25 MED ORDER — ONDANSETRON HCL 4 MG/2ML IJ SOLN
INTRAMUSCULAR | Status: AC
  Filled 2022-06-25: qty 2

## 2022-06-25 MED ORDER — HYDROMORPHONE HCL 1 MG/ML IJ SOLN: 0.2500 mg | INTRAMUSCULAR | Status: DC | PRN

## 2022-06-25 MED ORDER — FENTANYL CITRATE PF 50 MCG/ML IJ SOSY
50.0000 ug | PREFILLED_SYRINGE | Freq: Once | INTRAMUSCULAR | Status: AC
  Administered 2022-06-25: 100 ug via INTRAVENOUS
  Filled 2022-06-25: qty 1

## 2022-06-25 MED ORDER — ACETAMINOPHEN 10 MG/ML IV SOLN
1000.0000 mg | INTRAVENOUS | Status: AC
  Administered 2022-06-25: 1000 mg via INTRAVENOUS
  Filled 2022-06-25: qty 100

## 2022-06-25 MED ORDER — ORAL CARE MOUTH RINSE: 15.0000 mL | OROMUCOSAL | Status: DC | PRN

## 2022-06-25 MED ORDER — METHOCARBAMOL 1000 MG/10ML IJ SOLN
500.0000 mg | Freq: Four times a day (QID) | INTRAVENOUS | Status: DC | PRN
  Administered 2022-06-25 – 2022-07-02 (×3): 500 mg via INTRAVENOUS
  Filled 2022-06-25 (×2): qty 500
  Filled 2022-06-25 (×5): qty 5

## 2022-06-25 MED ORDER — FENTANYL CITRATE PF 50 MCG/ML IJ SOSY
PREFILLED_SYRINGE | INTRAMUSCULAR | Status: AC
  Filled 2022-06-25: qty 1

## 2022-06-25 MED ORDER — DOCUSATE SODIUM 50 MG/5ML PO LIQD
100.0000 mg | Freq: Two times a day (BID) | ORAL | Status: DC
  Administered 2022-06-27 (×2): 100 mg
  Filled 2022-06-25 (×2): qty 10

## 2022-06-25 MED ORDER — PANTOPRAZOLE SODIUM 40 MG IV SOLR
40.0000 mg | Freq: Two times a day (BID) | INTRAVENOUS | Status: DC
  Administered 2022-06-25 – 2022-06-28 (×7): 40 mg via INTRAVENOUS
  Filled 2022-06-25 (×7): qty 10

## 2022-06-25 MED ORDER — HYDROMORPHONE HCL 1 MG/ML IJ SOLN
0.5000 mg | INTRAMUSCULAR | Status: AC | PRN
  Administered 2022-06-25 (×2): 0.5 mg via INTRAVENOUS
  Filled 2022-06-25 (×2): qty 0.5

## 2022-06-25 MED ORDER — ORAL CARE MOUTH RINSE
15.0000 mL | OROMUCOSAL | Status: DC
  Administered 2022-06-25: 15 mL via OROMUCOSAL

## 2022-06-25 MED ORDER — PROPOFOL 10 MG/ML IV BOLUS
INTRAVENOUS | Status: AC
  Filled 2022-06-25: qty 20

## 2022-06-25 MED ORDER — ROCURONIUM BROMIDE 10 MG/ML (PF) SYRINGE
PREFILLED_SYRINGE | INTRAVENOUS | Status: AC
  Filled 2022-06-25: qty 10

## 2022-06-25 MED ORDER — ONDANSETRON HCL 4 MG/2ML IJ SOLN
4.0000 mg | Freq: Once | INTRAMUSCULAR | Status: AC
  Administered 2022-06-25: 4 mg via INTRAVENOUS
  Filled 2022-06-25: qty 2

## 2022-06-25 MED ORDER — ARTIFICIAL TEARS OPHTHALMIC OINT: TOPICAL_OINTMENT | OPHTHALMIC | Status: DC | PRN

## 2022-06-25 MED ORDER — PIPERACILLIN-TAZOBACTAM 3.375 G IVPB
INTRAVENOUS | Status: AC
  Administered 2022-06-26: 3.375 g via INTRAVENOUS
  Filled 2022-06-25: qty 50

## 2022-06-25 MED ORDER — ORAL CARE MOUTH RINSE
15.0000 mL | OROMUCOSAL | Status: DC
  Administered 2022-06-26 – 2022-06-28 (×27): 15 mL via OROMUCOSAL

## 2022-06-25 MED ORDER — POLYETHYLENE GLYCOL 3350 17 G PO PACK
17.0000 g | PACK | Freq: Every day | ORAL | Status: DC
  Administered 2022-06-27: 17 g
  Filled 2022-06-25: qty 1

## 2022-06-25 MED ORDER — ACETAMINOPHEN 650 MG RE SUPP: 650.0000 mg | Freq: Four times a day (QID) | RECTAL | Status: DC | PRN

## 2022-06-25 MED ORDER — ONDANSETRON HCL 4 MG/2ML IJ SOLN
INTRAMUSCULAR | Status: DC | PRN
  Administered 2022-06-25: 4 mg via INTRAVENOUS

## 2022-06-25 MED ORDER — CEFAZOLIN SODIUM-DEXTROSE 2-4 GM/100ML-% IV SOLN
2.0000 g | Freq: Once | INTRAVENOUS | Status: AC
  Administered 2022-06-25: 2 g via INTRAVENOUS
  Filled 2022-06-25: qty 100

## 2022-06-25 MED ORDER — LIDOCAINE HCL (PF) 2 % IJ SOLN
INTRAMUSCULAR | Status: AC
  Filled 2022-06-25: qty 5

## 2022-06-25 MED ORDER — PIPERACILLIN-TAZOBACTAM 3.375 G IVPB 30 MIN
3.3750 g | Freq: Once | INTRAVENOUS | Status: AC
  Administered 2022-06-25: 3.375 g via INTRAVENOUS
  Filled 2022-06-25: qty 50

## 2022-06-25 MED ORDER — DEXAMETHASONE SODIUM PHOSPHATE 10 MG/ML IJ SOLN
INTRAMUSCULAR | Status: AC
  Filled 2022-06-25: qty 1

## 2022-06-25 MED ORDER — FENTANYL CITRATE (PF) 250 MCG/5ML IJ SOLN
INTRAMUSCULAR | Status: AC
  Filled 2022-06-25: qty 5

## 2022-06-25 MED ORDER — DEXAMETHASONE SODIUM PHOSPHATE 10 MG/ML IJ SOLN
INTRAMUSCULAR | Status: DC | PRN
  Administered 2022-06-25: 10 mg via INTRAVENOUS

## 2022-06-25 MED ORDER — HYDROMORPHONE HCL 1 MG/ML IJ SOLN
1.0000 mg | Freq: Once | INTRAMUSCULAR | Status: AC
  Administered 2022-06-25: 1 mg via INTRAVENOUS
  Filled 2022-06-25: qty 1

## 2022-06-25 MED ORDER — SUCCINYLCHOLINE CHLORIDE 200 MG/10ML IV SOSY
PREFILLED_SYRINGE | INTRAVENOUS | Status: DC | PRN
  Administered 2022-06-25: 100 mg via INTRAVENOUS

## 2022-06-25 MED ORDER — PROPOFOL 10 MG/ML IV BOLUS
INTRAVENOUS | Status: DC | PRN
  Administered 2022-06-25: 120 ug/kg/min via INTRAVENOUS
  Administered 2022-06-25: 120 mg via INTRAVENOUS

## 2022-06-25 MED ORDER — BUPIVACAINE HCL (PF) 0.25 % IJ SOLN: INTRAMUSCULAR | Status: DC | PRN

## 2022-06-25 MED ORDER — CYCLOBENZAPRINE HCL 5 MG PO TABS: 5.0000 mg | ORAL_TABLET | Freq: Three times a day (TID) | ORAL | Status: DC | PRN

## 2022-06-25 MED ORDER — MIDAZOLAM HCL 5 MG/5ML IJ SOLN
INTRAMUSCULAR | Status: DC | PRN
  Administered 2022-06-25 (×2): 1 mg via INTRAVENOUS

## 2022-06-25 MED ORDER — ALBUMIN HUMAN 5 % IV SOLN: INTRAVENOUS | Status: DC | PRN

## 2022-06-25 MED ORDER — INSULIN ASPART 100 UNIT/ML IJ SOLN: 2.0000 [IU] | INTRAMUSCULAR | Status: DC

## 2022-06-25 MED ORDER — PIPERACILLIN-TAZOBACTAM 3.375 G IVPB
3.3750 g | Freq: Three times a day (TID) | INTRAVENOUS | Status: DC
  Administered 2022-06-25 – 2022-07-12 (×49): 3.375 g via INTRAVENOUS
  Filled 2022-06-25 (×50): qty 50

## 2022-06-25 MED ORDER — LACTATED RINGERS IV SOLN: INTRAVENOUS | Status: DC | PRN

## 2022-06-25 MED ORDER — IOHEXOL 300 MG/ML  SOLN: INTRAMUSCULAR | Status: DC | PRN

## 2022-06-25 MED ORDER — ACETAMINOPHEN 325 MG PO TABS: 650.0000 mg | ORAL_TABLET | Freq: Four times a day (QID) | ORAL | Status: DC | PRN

## 2022-06-25 MED ORDER — 0.9 % SODIUM CHLORIDE (POUR BTL) OPTIME
TOPICAL | Status: DC | PRN
  Administered 2022-06-25: 1000 mL

## 2022-06-25 MED ORDER — CHLORHEXIDINE GLUCONATE CLOTH 2 % EX PADS
6.0000 | MEDICATED_PAD | Freq: Every day | CUTANEOUS | Status: DC
  Administered 2022-06-25 – 2022-07-12 (×17): 6 via TOPICAL

## 2022-06-25 MED ORDER — FENTANYL CITRATE PF 50 MCG/ML IJ SOSY
50.0000 ug | PREFILLED_SYRINGE | Freq: Once | INTRAMUSCULAR | Status: AC
  Administered 2022-06-25: 50 ug via INTRAVENOUS

## 2022-06-25 MED ORDER — FAMOTIDINE 20 MG PO TABS: 20.0000 mg | ORAL_TABLET | Freq: Two times a day (BID) | ORAL | Status: DC

## 2022-06-25 MED ORDER — PROPOFOL 1000 MG/100ML IV EMUL
0.0000 ug/kg/min | INTRAVENOUS | Status: DC
  Administered 2022-06-25: 50 ug/kg/min via INTRAVENOUS
  Filled 2022-06-25 (×2): qty 100

## 2022-06-25 MED ORDER — FAMOTIDINE IN NACL 20-0.9 MG/50ML-% IV SOLN
20.0000 mg | Freq: Two times a day (BID) | INTRAVENOUS | Status: AC
  Administered 2022-06-25 – 2022-06-28 (×6): 20 mg via INTRAVENOUS
  Filled 2022-06-25 (×6): qty 50

## 2022-06-25 MED ORDER — LIDOCAINE 2% (20 MG/ML) 5 ML SYRINGE
INTRAMUSCULAR | Status: DC | PRN
  Administered 2022-06-25: 60 mg via INTRAVENOUS

## 2022-06-25 MED ORDER — MIDAZOLAM HCL 2 MG/2ML IJ SOLN
INTRAMUSCULAR | Status: AC
  Filled 2022-06-25: qty 2

## 2022-06-25 MED ORDER — FENTANYL BOLUS VIA INFUSION
50.0000 ug | INTRAVENOUS | Status: DC | PRN
  Administered 2022-06-25 – 2022-06-28 (×15): 100 ug via INTRAVENOUS

## 2022-06-25 MED ORDER — LACTATED RINGERS IV SOLN: INTRAVENOUS | Status: DC

## 2022-06-25 MED ORDER — BUPIVACAINE HCL 0.25 % IJ SOLN
INTRAMUSCULAR | Status: AC
  Filled 2022-06-25: qty 1

## 2022-06-25 MED ORDER — PROPOFOL 1000 MG/100ML IV EMUL
5.0000 ug/kg/min | INTRAVENOUS | Status: DC
  Administered 2022-06-25: 100 ug/kg/min via INTRAVENOUS
  Administered 2022-06-26: 75 ug/kg/min via INTRAVENOUS
  Administered 2022-06-26: 65 ug/kg/min via INTRAVENOUS
  Filled 2022-06-25 (×3): qty 100

## 2022-06-25 MED ORDER — ROCURONIUM BROMIDE 10 MG/ML (PF) SYRINGE
PREFILLED_SYRINGE | INTRAVENOUS | Status: DC | PRN
  Administered 2022-06-25: 40 mg via INTRAVENOUS
  Administered 2022-06-25: 50 mg via INTRAVENOUS
  Administered 2022-06-25: 20 mg via INTRAVENOUS
  Administered 2022-06-25: 40 mg via INTRAVENOUS

## 2022-06-25 MED ORDER — FENTANYL 2500MCG IN NS 250ML (10MCG/ML) PREMIX INFUSION
0.0000 ug/h | INTRAVENOUS | Status: DC
  Administered 2022-06-25 (×2): 50 ug/h via INTRAVENOUS
  Administered 2022-06-26 (×2): 225 ug/h via INTRAVENOUS
  Administered 2022-06-27: 200 ug/h via INTRAVENOUS
  Administered 2022-06-27: 225 ug/h via INTRAVENOUS
  Filled 2022-06-25 (×6): qty 250

## 2022-06-25 SURGICAL SUPPLY — 47 items
APPLIER CLIP ROT 10 11.4 M/L (STAPLE) ×1
BAG COUNTER SPONGE SURGICOUNT (BAG) IMPLANT
CABLE HIGH FREQUENCY MONO STRZ (ELECTRODE) ×1 IMPLANT
CATH URETL OPEN 5X70 (CATHETERS) IMPLANT
CHLORAPREP W/TINT 26 (MISCELLANEOUS) ×1 IMPLANT
CLIP APPLIE ROT 10 11.4 M/L (STAPLE) ×1 IMPLANT
COVER MAYO STAND XLG (MISCELLANEOUS) ×1 IMPLANT
COVER SURGICAL LIGHT HANDLE (MISCELLANEOUS) ×1 IMPLANT
DERMABOND ADVANCED .7 DNX12 (GAUZE/BANDAGES/DRESSINGS) ×1 IMPLANT
DRAPE C-ARM 42X120 X-RAY (DRAPES) IMPLANT
ELECT REM PT RETURN 15FT ADLT (MISCELLANEOUS) ×1 IMPLANT
ENDOLOOP SUT PDS II  0 18 (SUTURE) ×1
ENDOLOOP SUT PDS II 0 18 (SUTURE) ×1 IMPLANT
GLOVE BIO SURGEON STRL SZ7.5 (GLOVE) ×1 IMPLANT
GLOVE BIOGEL PI IND STRL 8 (GLOVE) ×1 IMPLANT
GOWN STRL REUS W/ TWL XL LVL3 (GOWN DISPOSABLE) ×2 IMPLANT
GOWN STRL REUS W/TWL XL LVL3 (GOWN DISPOSABLE) ×2
GRASPER SUT TROCAR 14GX15 (MISCELLANEOUS) IMPLANT
HEMOSTAT SNOW SURGICEL 2X4 (HEMOSTASIS) IMPLANT
IRRIG SUCT STRYKERFLOW 2 WTIP (MISCELLANEOUS) ×1
IRRIGATION SUCT STRKRFLW 2 WTP (MISCELLANEOUS) ×1 IMPLANT
IV CATH 14GX2 1/4 (CATHETERS) ×1 IMPLANT
KIT BASIN OR (CUSTOM PROCEDURE TRAY) ×1 IMPLANT
KIT TURNOVER KIT A (KITS) IMPLANT
LIGASURE IMPACT 36 18CM CVD LR (INSTRUMENTS) IMPLANT
NDL INSUFFLATION 14GA 120MM (NEEDLE) ×1 IMPLANT
NEEDLE INSUFFLATION 14GA 120MM (NEEDLE) ×1 IMPLANT
PENCIL SMOKE EVACUATOR (MISCELLANEOUS) IMPLANT
POUCH RETRIEVAL ECOSAC 10 (ENDOMECHANICALS) ×1 IMPLANT
POUCH RETRIEVAL ECOSAC 10MM (ENDOMECHANICALS) ×1
RELOAD PROXIMATE 75MM BLUE (ENDOMECHANICALS) ×4 IMPLANT
RELOAD STAPLE 75 3.8 BLU REG (ENDOMECHANICALS) IMPLANT
RETRACTOR WND ALEXIS 25 LRG (MISCELLANEOUS) IMPLANT
RTRCTR WOUND ALEXIS 25CM LRG (MISCELLANEOUS) ×1
SCISSORS LAP 5X35 DISP (ENDOMECHANICALS) ×1 IMPLANT
SET TUBE SMOKE EVAC HIGH FLOW (TUBING) ×1 IMPLANT
SLEEVE Z-THREAD 5X100MM (TROCAR) ×2 IMPLANT
SPIKE FLUID TRANSFER (MISCELLANEOUS) ×1 IMPLANT
STAPLER PROXIMATE 75MM BLUE (STAPLE) IMPLANT
STOPCOCK 4 WAY LG BORE MALE ST (IV SETS) IMPLANT
SUT MNCRL AB 4-0 PS2 18 (SUTURE) ×1 IMPLANT
SUT VIC AB 2-0 SH 18 (SUTURE) IMPLANT
TOWEL OR 17X26 10 PK STRL BLUE (TOWEL DISPOSABLE) ×1 IMPLANT
TOWEL OR NON WOVEN STRL DISP B (DISPOSABLE) IMPLANT
TRAY LAPAROSCOPIC (CUSTOM PROCEDURE TRAY) ×1 IMPLANT
TROCAR ADV FIXATION 12X100MM (TROCAR) ×1 IMPLANT
TROCAR Z-THREAD OPTICAL 5X100M (TROCAR) ×1 IMPLANT

## 2022-06-25 NOTE — Anesthesia Procedure Notes (Signed)
Procedure Name: Intubation Date/Time: 06/25/2022 4:38 PM  Performed by: Cleda Daub, CRNAPre-anesthesia Checklist: Patient identified, Emergency Drugs available, Suction available and Patient being monitored Patient Re-evaluated:Patient Re-evaluated prior to induction Oxygen Delivery Method: Circle system utilized Preoxygenation: Pre-oxygenation with 100% oxygen Induction Type: IV induction, Rapid sequence and Cricoid Pressure applied Laryngoscope Size: Mac and 3 Grade View: Grade I Tube type: Oral Tube size: 7.5 mm Number of attempts: 1 Airway Equipment and Method: Stylet and Oral airway Placement Confirmation: ETT inserted through vocal cords under direct vision, positive ETCO2 and breath sounds checked- equal and bilateral Secured at: 23 cm Tube secured with: Tape Dental Injury: Teeth and Oropharynx as per pre-operative assessment

## 2022-06-25 NOTE — Anesthesia Preprocedure Evaluation (Addendum)
Anesthesia Evaluation  Patient identified by MRN, date of birth, ID band Patient awake    Reviewed: Allergy & Precautions, H&P , NPO status , Patient's Chart, lab work & pertinent test results  Airway Mallampati: II  TM Distance: >3 FB Neck ROM: Full    Dental no notable dental hx. (+) Teeth Intact, Dental Advisory Given   Pulmonary Current Smoker and Patient abstained from smoking.   Pulmonary exam normal breath sounds clear to auscultation       Cardiovascular negative cardio ROS  Rhythm:Regular Rate:Normal     Neuro/Psych negative neurological ROS  negative psych ROS   GI/Hepatic Neg liver ROS, PUD,GERD  Medicated,,  Endo/Other  negative endocrine ROS    Renal/GU negative Renal ROS  negative genitourinary   Musculoskeletal   Abdominal   Peds  Hematology  (+) Blood dyscrasia, anemia   Anesthesia Other Findings   Reproductive/Obstetrics negative OB ROS                             Anesthesia Physical Anesthesia Plan  ASA: 2  Anesthesia Plan: General   Post-op Pain Management: Tylenol PO (pre-op)*   Induction: Intravenous, Rapid sequence and Cricoid pressure planned  PONV Risk Score and Plan: 2 and Ondansetron, Dexamethasone and Midazolam  Airway Management Planned: Oral ETT  Additional Equipment:   Intra-op Plan:   Post-operative Plan: Extubation in OR  Informed Consent: I have reviewed the patients History and Physical, chart, labs and discussed the procedure including the risks, benefits and alternatives for the proposed anesthesia with the patient or authorized representative who has indicated his/her understanding and acceptance.     Dental advisory given  Plan Discussed with: CRNA  Anesthesia Plan Comments:        Anesthesia Quick Evaluation

## 2022-06-25 NOTE — Consult Note (Signed)
Urology Consult   Physician requesting consult: Dr. Loanne Drilling  Reason for consult: Difficult urethral catheterization  History of Present Illness: Gabriel Dalton is a 37 y.o. who underwent exploratory surgery for possible gallbladder removal this evening.  He was noted to have perforated bowel with feces in his belly and was acutely sick.  Attempts to place a catheter were unsuccessful.  Per history, he does have BPH and has been on medication for BPH with tamsulosin listed on his medication list.  Urology consultation was obtained for assistance with catheterization due to his medical acuity and need for close urine output monitoring.   Past Medical History:  Diagnosis Date   Bronchitis    Multiple gastric ulcers     Past Surgical History:  Procedure Laterality Date   COLONOSCOPY     I & D EXTREMITY  10/18/2011   Procedure: IRRIGATION AND DEBRIDEMENT EXTREMITY;  Surgeon: Roseanne Kaufman, MD;  Location: [facility];  Service: Orthopedics;  Laterality: Right;  Irrigation and Debridement  Right Middle Finger; Removal of foreign body    Medications:  Home meds:  No current facility-administered medications on file prior to encounter.   Current Outpatient Medications on File Prior to Encounter  Medication Sig Dispense Refill   acetaminophen (TYLENOL) 500 MG tablet Take 1,000 mg by mouth as needed for moderate pain.     ciprofloxacin (CIPRO) 500 MG tablet Take 500 mg by mouth 2 (two) times daily.     dicyclomine (BENTYL) 20 MG tablet Take 1 tablet (20 mg total) by mouth 2 (two) times daily. (Patient taking differently: Take 20 mg by mouth daily as needed for spasms.) 60 tablet 3   linaclotide (LINZESS) 145 MCG CAPS capsule Take 1 capsule (145 mcg total) by mouth daily before breakfast. (Patient taking differently: Take 145 mcg by mouth as needed (constipation).) 90 capsule 3   pantoprazole (PROTONIX) 40 MG tablet Take 1 tablet (40 mg total) by mouth 2 (two) times daily. 60 tablet 3    sucralfate (CARAFATE) 1 g tablet Take 1 tablet (1 g total) by mouth 4 (four) times daily -  with meals and at bedtime. (Patient taking differently: Take 1 g by mouth as needed (heartburn).) 120 tablet 3   tamsulosin (FLOMAX) 0.4 MG CAPS capsule Take 0.4 mg by mouth daily.     ketorolac (TORADOL) 10 MG tablet Take 10 mg by mouth every 6 (six) hours as needed. (Patient not taking: Reported on 06/25/2022)     oxyCODONE-acetaminophen (PERCOCET/ROXICET) 5-325 MG tablet Take 1 tablet by mouth every 6 (six) hours as needed for severe pain. (Patient not taking: Reported on 06/25/2022) 6 tablet 0   promethazine (PHENERGAN) 25 MG tablet Take 1 tablet (25 mg total) by mouth every 6 (six) hours as needed for nausea or vomiting. (Patient not taking: Reported on 06/25/2022) 30 tablet 0     Scheduled Meds:  docusate  100 mg Per Tube BID   famotidine  20 mg Per Tube BID   fentaNYL       fentaNYL       mouth rinse  15 mL Mouth Rinse Q2H   [MAR Hold] pantoprazole (PROTONIX) IV  40 mg Intravenous Q12H   polyethylene glycol  17 g Per Tube Daily   Continuous Infusions:  sodium chloride Stopped (06/25/22 1229)   fentaNYL infusion INTRAVENOUS     [MAR Hold] methocarbamol (ROBAXIN) IV Stopped (06/25/22 1215)   [START ON 06/26/2022] piperacillin-tazobactam     propofol (DIPRIVAN) infusion 50 mcg/kg/min (06/25/22 1840)  PRN Meds:.0.9 % irrigation (POUR BTL), [MAR Hold] acetaminophen **OR** [MAR Hold] acetaminophen, fentaNYL, fentaNYL, fentaNYL, HYDROmorphone (DILAUDID) injection, [MAR Hold] methocarbamol (ROBAXIN) IV, [MAR Hold] mouth rinse, mouth rinse  Allergies:  Allergies  Allergen Reactions   Aspirin Other (See Comments)    Cannot take due to stomach ulcers    Family History  Problem Relation Age of Onset   Healthy Mother    Hypertension Father    Colon cancer Maternal Uncle    Rectal cancer Maternal Uncle    Brain cancer Maternal Uncle    Heart disease Maternal Aunt    Esophageal cancer Neg Hx     Stomach cancer Neg Hx     Social History:  reports that he has been smoking cigarettes. He has been smoking an average of 1 pack per day. He has never used smokeless tobacco. He reports that he does not currently use alcohol. He reports that he does not currently use drugs. Frequency: 3.00 times per week.  ROS: A complete review of systems was performed.  All systems are negative except for pertinent findings as noted.  Physical Exam:  Vital signs in last 24 hours: Temp:  [97.5 F (36.4 C)-98.7 F (37.1 C)] 97.7 F (36.5 C) (02/14 1825) Pulse Rate:  [82-139] 116 (02/14 1845) Resp:  [15-38] 15 (02/14 1845) BP: (106-143)/(76-103) 106/86 (02/14 1845) SpO2:  [98 %-100 %] 100 % (02/14 1845) FiO2 (%):  [100 %] 100 % (02/14 1845) Weight:  [49.2 kg] 49.2 kg (02/13 2100) Constitutional: Intubated and sedated Cardiovascular: Regular rate, tachycardic Respiratory: He is currently on the ventilator. GI: Abdominal wound is open and packed. Genitourinary: Normal male phallus, normal urethral meatus.  Normal testes to palpation.  Testes are descended bilaterally. Neurologic: Unable to assess due to sedation Psychiatric: Unable to assess due to sedation  Laboratory Data:  Recent Labs    06/24/22 1400  WBC 7.9  HGB 11.7*  HCT 32.8*  PLT 590*    Recent Labs    06/24/22 1400  NA 129*  K 4.0  CL 90*  GLUCOSE 113*  BUN 21*  CALCIUM 9.3  CREATININE 0.81     Results for orders placed or performed during the hospital encounter of 06/24/22 (from the past 24 hour(s))  TSH     Status: None   Collection Time: 06/24/22  9:42 PM  Result Value Ref Range   TSH 1.812 0.350 - 4.500 uIU/mL  Vitamin B12     Status: None   Collection Time: 06/24/22  9:42 PM  Result Value Ref Range   Vitamin B-12 728 180 - 914 pg/mL  Folate     Status: None   Collection Time: 06/24/22  9:42 PM  Result Value Ref Range   Folate 15.9 >5.9 ng/mL  HIV Antibody (routine testing w rflx)     Status: None    Collection Time: 06/24/22  9:42 PM  Result Value Ref Range   HIV Screen 4th Generation wRfx Non Reactive Non Reactive  Hemoglobin A1c     Status: Abnormal   Collection Time: 06/24/22  9:42 PM  Result Value Ref Range   Hgb A1c MFr Bld 5.8 (H) 4.8 - 5.6 %   Mean Plasma Glucose 119.76 mg/dL  Urinalysis, Routine w reflex microscopic -Urine, Clean Catch     Status: Abnormal   Collection Time: 06/25/22  1:40 AM  Result Value Ref Range   Color, Urine AMBER (A) YELLOW   APPearance HAZY (A) CLEAR   Specific Gravity, Urine 1.033 (H)  1.005 - 1.030   pH 5.0 5.0 - 8.0   Glucose, UA NEGATIVE NEGATIVE mg/dL   Hgb urine dipstick NEGATIVE NEGATIVE   Bilirubin Urine NEGATIVE NEGATIVE   Ketones, ur 5 (A) NEGATIVE mg/dL   Protein, ur 30 (A) NEGATIVE mg/dL   Nitrite NEGATIVE NEGATIVE   Leukocytes,Ua NEGATIVE NEGATIVE   RBC / HPF 0-5 0 - 5 RBC/hpf   WBC, UA 0-5 0 - 5 WBC/hpf   Bacteria, UA RARE (A) NONE SEEN   Squamous Epithelial / HPF 0-5 0 - 5 /HPF   Mucus PRESENT    Hyaline Casts, UA PRESENT    No results found for this or any previous visit (from the past 240 hour(s)).  Renal Function: Recent Labs    06/24/22 1400  CREATININE 0.81   Estimated Creatinine Clearance: 88.6 mL/min (by C-G formula based on SCr of 0.81 mg/dL).  Radiologic Imaging: CT Head Wo Contrast  Result Date: 06/24/2022 CLINICAL DATA:  Bilateral lower extremity weakness x2 months. EXAM: CT HEAD WITHOUT CONTRAST TECHNIQUE: Contiguous axial images were obtained from the base of the skull through the vertex without intravenous contrast. RADIATION DOSE REDUCTION: This exam was performed according to the departmental dose-optimization program which includes automated exposure control, adjustment of the mA and/or kV according to patient size and/or use of iterative reconstruction technique. COMPARISON:  None Available. FINDINGS: Brain: No evidence of acute infarction, hemorrhage, hydrocephalus, extra-axial collection or mass  lesion/mass effect. Vascular: No hyperdense vessel or unexpected calcification. Skull: Normal. Negative for fracture or focal lesion. Sinuses/Orbits: No acute finding. Other: None. IMPRESSION: No acute intracranial pathology. Electronically Signed   By: Virgina Norfolk M.D.   On: 06/24/2022 19:44   MR Lumbar Spine W Wo Contrast  Result Date: 06/24/2022 CLINICAL DATA:  Provided history: Low back pain, symptoms persist with greater than 6 weeks treatment. Additional history provided by the scanning technologist: The patient reports back pain, bilateral lower extremity numbness and decreased sensation (right greater than left). Right leg slightly weaker than left. EXAM: MRI LUMBAR SPINE WITHOUT AND WITH CONTRAST TECHNIQUE: Multiplanar and multiecho pulse sequences of the lumbar spine were obtained without and with intravenous contrast. CONTRAST:  103m GADAVIST GADOBUTROL 1 MMOL/ML IV SOLN COMPARISON:  Lumbar spine radiographs 03/23/2019. FINDINGS: Segmentation: 5 lumbar vertebrae. The caudal most well-formed interval disc space is designated L5-S1. Alignment: Levocurvature of the lumbar spine. No significant spondylolisthesis. Vertebrae: Vertebral body height is maintained. Mild degenerative endplate signal changes at L2-L3 and L3-L4. Conus medullaris and cauda equina: Conus extends to the L2 level. No signal abnormality identified within the visualized distal spinal cord. No pathologic enhancement of the visualized distal spinal cord or cauda equina nerve roots. Paraspinal and other soft tissues: No paraspinal mass or collection. No acute finding within included portions of the abdomen/retroperitoneum. Disc levels: Mild disc degeneration at L5-S1. T12-L1: No significant disc herniation or stenosis. L1-L2: No significant disc herniation or stenosis. L2-L3: No significant disc herniation or stenosis. L3-L4: Slight disc bulge. No significant spinal canal or foraminal stenosis. L4-L5: No significant disc herniation  or stenosis. L5-S1: Small left subarticular disc protrusion (at site of posterior annular fissure) (for as seen on series 5, image 9) (series 8, image 37). The disc protrusion results in slight left subarticular narrowing, contacting the descending left S1 nerve root. No significant central canal stenosis or neural foraminal narrowing. IMPRESSION: Lumbar spondylosis, as outlined and with findings most notably as follows. At L5-S1, there is mild disc degeneration. Small left subarticular disc protrusion (at site  of posterior annular fissure). The disc protrusion results in slight left subarticular narrowing, and contacts the descending left S1 nerve root. No significant central canal stenosis. Levocurvature of the lumbar spine. Electronically Signed   By: Kellie Simmering D.O.   On: 06/24/2022 19:07   US Abdomen Limited RUQ (LIVER/GB)  Result Date: 06/24/2022 CLINICAL DATA:  Right upper quadrant abdominal pain. EXAM: ULTRASOUND ABDOMEN LIMITED RIGHT UPPER QUADRANT COMPARISON:  June 03, 2022. FINDINGS: Gallbladder: Sludge and probable cholelithiasis is noted within the gallbladder lumen. No gallbladder wall thickening or pericholecystic fluid is noted. No sonographic Murphy's sign is noted. Common bile duct: Diameter: 3 mm which is within normal limits. Liver: No focal lesion identified. Within normal limits in parenchymal echogenicity. Portal vein is patent on color Doppler imaging with normal direction of blood flow towards the liver. Other: None. IMPRESSION: Cholelithiasis and gallbladder sludge is noted without evidence of cholecystitis. Electronically Signed   By: Marijo Conception M.D.   On: 06/24/2022 15:11    I independently reviewed the above imaging studies.  Procedure:  Under sterile conditions, I attempted to place a 16 French Foley catheter.  Resistance was met proximally.  Considering his history of BPH, I then utilized a 23 [place] coud catheter which was able to pass into the bladder without  significant resistance.  There was return of clear urine.  Impression/Recommendation Difficult urethral catheterization: Continue indwelling catheter until no longer medical necessary.  Okay to proceed with a voiding trial at that point.  Please call urology if further concerns or questions.  Dutch Gray 06/25/2022, 7:06 PM    Pryor Curia MD  CC: Dr. Loanne Drilling

## 2022-06-25 NOTE — Consult Note (Addendum)
NAME:  Gabriel Dalton, MRN:  [id number], DOB:  1985-07-03, LOS: 0 ADMISSION DATE:  06/24/2022, CONSULTATION DATE:  06/25/22 REFERRING MD:  Erlinda Hong - [facility] , CHIEF COMPLAINT: endotracheally intubated post op    History of Present Illness:   36 yo M chronic abd pain, wt loss, gallstones (for which he saw [facility] outpt 2/8 and chole was offered but not yet scheduled) presented to [facility] ED 2/13 with CC abd pain.   [facility] was consulted 2/13 and pt was posted for lap chole 2/14. Op course c/b  identification of numerous small bowel perforations and was converted to open surgery, with evacuation of 1L bowel contents.   PCCM was called peri op as plan post op has evolved to remain intubated and transfer to ICU   Pertinent  Medical History  Chronic abd pain   Significant Hospital Events: Including procedures, antibiotic start and stop dates in addition to other pertinent events   2/13 admit. [facility] consult for chole eval given known hx gallstones  2/14 lap chole --> open procedure   Interim History / Subjective:  Left open Intubated   Objective   Blood pressure (!) 142/101, pulse (!) 139, temperature 97.8 F (36.6 C), temperature source Oral, resp. rate (!) 38, height 5' 8"$  (1.727 m), weight 49.2 kg, SpO2 98 %.        Intake/Output Summary (Last 24 hours) at 06/25/2022 1744 Last data filed at 06/25/2022 1700 Gross per 24 hour  Intake 1462.32 ml  Output 350 ml  Net 1112.32 ml   Filed Weights   06/24/22 1357 06/24/22 2100  Weight: 49 kg 49.2 kg    Examination: General: chronically ill cachectic M intubated sedated HENT: ETT secure anicteric sclera Lungs: CTAb  Cardiovascular: rrr Abdomen: thin nd. Open abdomen w wound vac  Extremities: no acute joint deformity. Symmetrically decr muscle bulk and tone Neuro: sedated and chemically paralyzed   Resolved Hospital Problem list     Assessment & Plan:   Endotracheally intubated Cholelithiasis s/p cholecystectomy  Multiple bowel perfs  of unclear etiology s/p ex lap  Concern for intra-abdominal infection, peritonitis given perf  Severe protein calorie malnutrition  Hyponatremia Anemia  Thrombocytosis  Prediabetes  P -CXR and ABG in ICU -wean MV as tolerated  -VAP, pulm hygiene -RASS -3/-4 -- prop and fent as is open - post op per [facility] -return to OR timing per [facility]  - post op CBC - zosyn  -AM labs    Best Practice (right click and "Reselect all SmartList Selections" daily)   Diet/type: NPO DVT prophylaxis: SCD GI prophylaxis: H2B Lines: N/A Foley:  Yes, and it is still needed Code Status:  full code Last date of multidisciplinary goals of care discussion [--]  Labs   CBC: Recent Labs  Lab 06/24/22 1400  WBC 7.9  NEUTROABS 5.2  HGB 11.7*  HCT 32.8*  MCV 83.0  PLT 590*    Basic Metabolic Panel: Recent Labs  Lab 06/24/22 1400  NA 129*  K 4.0  CL 90*  CO2 26  GLUCOSE 113*  BUN 21*  CREATININE 0.81  CALCIUM 9.3   GFR: Estimated Creatinine Clearance: 88.6 mL/min (by C-G formula based on SCr of 0.81 mg/dL). Recent Labs  Lab 06/24/22 1400  WBC 7.9    Liver Function Tests: Recent Labs  Lab 06/24/22 1400  AST 19  ALT 22  ALKPHOS 82  BILITOT 0.5  PROT 7.0  ALBUMIN 3.2*   Recent Labs  Lab 06/24/22 1400  LIPASE 30  No results for input(s): "AMMONIA" in the last 168 hours.  ABG No results found for: "PHART", "PCO2ART", "PO2ART", "HCO3", "TCO2", "ACIDBASEDEF", "O2SAT"   Coagulation Profile: No results for input(s): "INR", "PROTIME" in the last 168 hours.  Cardiac Enzymes: No results for input(s): "CKTOTAL", "CKMB", "CKMBINDEX", "TROPONINI" in the last 168 hours.  HbA1C: Hgb A1c MFr Bld  Date/Time Value Ref Range Status  06/24/2022 09:42 PM 5.8 (H) 4.8 - 5.6 % Final    Comment:    (NOTE) Pre diabetes:          5.7%-6.4%  Diabetes:              >6.4%  Glycemic control for   <7.0% adults with diabetes     CBG: No results for input(s): "GLUCAP" in the last 168  hours.  Review of Systems:   Unable to obtain intubated sedated   Past Medical History:  He,  has a past medical history of Bronchitis and Multiple gastric ulcers.   Surgical History:   Past Surgical History:  Procedure Laterality Date   COLONOSCOPY     I & D EXTREMITY  10/18/2011   Procedure: IRRIGATION AND DEBRIDEMENT EXTREMITY;  Surgeon: Roseanne Kaufman, MD;  Location: [facility];  Service: Orthopedics;  Laterality: Right;  Irrigation and Debridement  Right Middle Finger; Removal of foreign body     Social History:   reports that he has been smoking cigarettes. He has been smoking an average of 1 pack per day. He has never used smokeless tobacco. He reports that he does not currently use alcohol. He reports that he does not currently use drugs. Frequency: 3.00 times per week.   Family History:  His family history includes Brain cancer in his maternal uncle; Colon cancer in his maternal uncle; Healthy in his mother; Heart disease in his maternal aunt; Hypertension in his father; Rectal cancer in his maternal uncle. There is no history of Esophageal cancer or Stomach cancer.   Allergies Allergies  Allergen Reactions   Aspirin Other (See Comments)    Cannot take due to stomach ulcers     Home Medications  Prior to Admission medications   Medication Sig Start Date End Date Taking? Authorizing Provider  acetaminophen (TYLENOL) 500 MG tablet Take 1,000 mg by mouth as needed for moderate pain.   Yes [provider]  ciprofloxacin (CIPRO) 500 MG tablet Take 500 mg by mouth 2 (two) times daily. 06/20/22  Yes [provider]  dicyclomine (BENTYL) 20 MG tablet Take 1 tablet (20 mg total) by mouth 2 (two) times daily. Patient taking differently: Take 20 mg by mouth daily as needed for spasms. 05/28/22  Yes Zehr, Laban Emperor, PA-C  linaclotide (LINZESS) 145 MCG CAPS capsule Take 1 capsule (145 mcg total) by mouth daily before breakfast. Patient taking differently: Take 145 mcg by  mouth as needed (constipation). 04/23/22  Yes Zehr, Laban Emperor, PA-C  pantoprazole (PROTONIX) 40 MG tablet Take 1 tablet (40 mg total) by mouth 2 (two) times daily. 04/23/22  Yes Zehr, Laban Emperor, PA-C  sucralfate (CARAFATE) 1 g tablet Take 1 tablet (1 g total) by mouth 4 (four) times daily -  with meals and at bedtime. Patient taking differently: Take 1 g by mouth as needed (heartburn). 05/28/22  Yes Zehr, Laban Emperor, PA-C  tamsulosin (FLOMAX) 0.4 MG CAPS capsule Take 0.4 mg by mouth daily. 06/20/22  Yes [provider]  ketorolac (TORADOL) 10 MG tablet Take 10 mg by mouth every 6 (six) hours as  needed. Patient not taking: Reported on 06/25/2022 06/20/22   [provider]  oxyCODONE-acetaminophen (PERCOCET/ROXICET) 5-325 MG tablet Take 1 tablet by mouth every 6 (six) hours as needed for severe pain. Patient not taking: Reported on 06/25/2022 05/18/22   Rex Kras, PA  promethazine (PHENERGAN) 25 MG tablet Take 1 tablet (25 mg total) by mouth every 6 (six) hours as needed for nausea or vomiting. Patient not taking: Reported on 06/25/2022 05/18/22   Rex Kras, PA     Critical care time: 40 min    CRITICAL CARE Performed by: Cristal Generous   Total critical care time: 40 minutes  Critical care time was exclusive of separately billable procedures and treating other patients. Critical care was necessary to treat or prevent imminent or life-threatening deterioration.  Critical care was time spent personally by me on the following activities: development of treatment plan with patient and/or surrogate as well as nursing, discussions with consultants, evaluation of patient's response to treatment, examination of patient, obtaining history from patient or surrogate, ordering and performing treatments and interventions, ordering and review of laboratory studies, ordering and review of radiographic studies, pulse oximetry and re-evaluation of patient's condition.  Eliseo Gum MSN, AGACNP-BC [place] for pager  06/25/2022, 6:45 PM

## 2022-06-25 NOTE — Progress Notes (Signed)
[facility] Progress Note Patient Name: Gabriel Dalton DOB: 05-12-1986 MRN: [id number]   Date of Service  06/25/2022  HPI/Events of Note  Patient admitted to the ICU from the PACU with acute post-operative respiratory failure following an exploratory laparotomy for bowel perforation with extensive peritoneal soilage, he is intubated and mechanically ventilated.  eICU Interventions  New Patient Evaluation.        Frederik Pear 06/25/2022, 8:57 PM

## 2022-06-25 NOTE — Progress Notes (Signed)
PROGRESS NOTE    Gabriel Dalton  [id number] DOB: 1986-02-18 DOA: 06/24/2022 PCP: Marrian Salvage, FNP     Brief Narrative:    H/o chronic abdominal painx 89yr with significant weight loss ( 60lbs), presents to the ED due to persistent abdominal pain.  he has had extensive workup including multiple CT scans, MRI, ultrasound, NM gastric emptying study, most recent endoscopy evaluation in August 2023 revealing esophagitis with no bleeding, gastritis and biopsy with mild nonspecific reactive gastropathy, he has had positive occult blood 05/31/2022 which was attributed to hemorrhoids.   He was recently seen by general surgery on 06/19/2022 to discuss cholecystectomy given that he has a gallstone.  In the ED, vitals stable labs with hyponatremia, normal LFTs lipase CBC with hemoglobin at 11.7 g was 14 on 06/03/22. [facility]   patient also complained of bilateral lower extremity numbness and somewhat decreased sensation more on the right leg than the left, he reports he may have slightly weaker right leg than the left, this has not going on for few months, has been using cane to get around.  In the ED CT head on acute findings,  MRI lumbar spine showed degenerative changes and mild disc protrusion that contacts the descending left S1 nerve root    He also admits to difficulties urinating which he attributes to an enlarged prostate   Subjective:  Npo, general surgery this afternoon ,c/o ab pain  Assessment & Plan:  Principal Problem:   Cholelithiasis Active Problems:   Protein-calorie malnutrition, severe    Assessment and Plan:  Abdominal pain Likely multifactorial Continue ppi due to h/o gastritis Ab [place]"Cholelithiasis and gallbladder sludge is noted without evidence of cholecystitis." He is npo, to have cholecystectomy this afternoon  Hyponatremia Likely due to  dehydration Continue hydration  FOBT + on 05/31/2022 Last colonoscopy from August 2023 showed internal and external hemorrhoids Monitor hemoglobin Avoid constipation  Bilateral lower extremity numbness/diminished sensation on RLE/Generalized weakness:  Tsh/b12/folic acid wnl MRI L-spine showed degenerative changes Supportive management   Significant weight loss Severe protein calorie malnutrition: Dietitian consulted. Nutritional Assessment: The patient's BMI is: Body mass index is 16.49 kg/m..Marland KitchenSeen by dietician.  I agree with the assessment and plan as outlined below: Nutrition Status: Nutrition Problem: Severe Malnutrition Etiology: chronic illness Signs/Symptoms: severe fat depletion, severe muscle depletion, percent weight loss (22% weight loss in 11 months) Percent weight loss: 22 % (in 11 months) Interventions: Refer to RD note for recommendations, Boost Breeze, MVI  .        I have Reviewed nursing notes, Vitals, pain scores, I/o's, Lab results and  imaging results since pt's last encounter, details please see discussion above  I ordered the following labs:  Unresulted Labs (From admission, onward)     Start     Ordered   06/26/22 0500  CBC with Differential/Platelet  Tomorrow morning,   R        06/25/22 1531   06/26/22 0500  Comprehensive metabolic panel  Tomorrow morning,   R        06/25/22 1531   06/26/22 0500  Magnesium  Tomorrow morning,   R        06/25/22 1531   06/26/22 0500  Phosphorus  Tomorrow morning,   R        06/25/22 1531   06/24/22 1736  Vitamin B1  Add-on,   AD  06/24/22 1736   06/24/22 1736  VITAMIN D 25 Hydroxy (Vit-D Deficiency, Fractures)  Add-on,   AD        06/24/22 1736             DVT prophylaxis: SCDs Start: 06/24/22 1743   Code Status:   Code Status: Full Code  Family Communication: None at bedside Disposition:   Status is: Observation  Dispo: The patient is from: Home              Anticipated d/c is to:  Home              Anticipated d/c date is: need general surgery clearance  Antimicrobials:    Anti-infectives (From admission, onward)    Start     Dose/Rate Route Frequency Ordered Stop   06/25/22 1630  ceFAZolin (ANCEF) IVPB 2g/100 mL premix        2 g 200 mL/hr over 30 Minutes Intravenous  Once 06/25/22 1534            Objective: Vitals:   06/25/22 0129 06/25/22 0536 06/25/22 1009 06/25/22 1450  BP: 128/76 119/85 (!) 135/96 (!) 143/103  Pulse: (!) 110 (!) 109 (!) 102 (!) 122  Resp: 17 18 20 $ (!) 22  Temp: 98.4 F (36.9 C) (!) 97.5 F (36.4 C) 97.7 F (36.5 C) 97.7 F (36.5 C)  TempSrc: Oral Oral Oral Oral  SpO2: 99% 98% 100% 100%  Weight:      Height:        Intake/Output Summary (Last 24 hours) at 06/25/2022 1533 Last data filed at 06/25/2022 1313 Gross per 24 hour  Intake 2212.32 ml  Output 350 ml  Net 1862.32 ml   Filed Weights   06/24/22 1357 06/24/22 2100  Weight: 49 kg 49.2 kg    Examination:  General exam: aaox3, thin,  Respiratory system: Clear to auscultation. Respiratory effort normal. Cardiovascular system:  RRR.  Gastrointestinal system: Abdomen is tender, + bowel sounds heard. Central nervous system: Alert and oriented. No focal neurological deficits. Extremities:  no edema Skin: No rashes, lesions or ulcers Psychiatry: Judgement and insight appear normal. Mood & affect appropriate.     Data Reviewed: I have personally reviewed  labs and visualized  imaging studies since the last encounter and formulate the plan        Scheduled Meds: Continuous Infusions:  sodium chloride Stopped (06/25/22 1229)   methocarbamol (ROBAXIN) IV Stopped (06/25/22 1215)     LOS: 0 days   Time spent: 28mns  FFlorencia Reasons MD PhD FACP [facility]  Available via Epic secure chat 7am-7pm for nonurgent issues Please page for urgent issues To page the attending provider between 7A-7P or the covering provider during after hours 7P-7A, please log  into the web site [email or web address] and access using universal [place] password for that web site. If you do not have the password, please call the hospital operator.    06/25/2022, 3:33 PM

## 2022-06-25 NOTE — Transfer of Care (Signed)
Immediate Anesthesia Transfer of Care Note  Patient: Gabriel Dalton  Procedure(s) Performed: LAPAROSCOPIC CHOLECYSTECTOMY  Patient Location: PACU  Anesthesia Type:General  Level of Consciousness: Patient remains intubated per anesthesia plan  Airway & Oxygen Therapy: Patient remains intubated per anesthesia plan and Patient placed on Ventilator (see vital sign flow sheet for setting)  Post-op Assessment: Report given to RN and Post -op Vital signs reviewed and stable  Post vital signs: Reviewed and stable  Last Vitals:  Vitals Value Taken Time  BP 112/84 06/25/22 1830  Temp    Pulse 115 06/25/22 1830  Resp 15 06/25/22 1836  SpO2 100 % 06/25/22 1830  Vitals shown include unvalidated device data.  Last Pain:  Vitals:   06/25/22 1544  TempSrc: Oral  PainSc: 10-Worst pain ever      Patients Stated Pain Goal: 2 ([id number])  Complications: No notable events documented.

## 2022-06-25 NOTE — Anesthesia Postprocedure Evaluation (Signed)
Anesthesia Post Note  Patient: Gabriel Dalton  Procedure(s) Performed: LAPAROSCOPIC CHOLECYSTECTOMY     Patient location during evaluation: PACU Anesthesia Type: General Level of consciousness: patient remains intubated per anesthesia plan Pain management: pain level controlled Vital Signs Assessment: post-procedure vital signs reviewed and stable Respiratory status: patient on ventilator - see flowsheet for VS Cardiovascular status: blood pressure returned to baseline and stable Postop Assessment: no apparent nausea or vomiting Anesthetic complications: no Comments: Pt left intubated at the direction of General Surgery. Pt with an open abdomen that will require repeated trips to the operating room. Pt brought to PACU on vent. Awaiting an ICU bed.  No notable events documented.  Last Vitals:  Vitals:   06/25/22 1830 06/25/22 1845  BP: 112/84 106/86  Pulse: (!) 115 (!) 116  Resp: 15 15  Temp:    SpO2: 100% 100%    Last Pain:  Vitals:   06/25/22 1825  TempSrc:   PainSc: Asleep                 Elexa Kivi,W. EDMOND

## 2022-06-25 NOTE — Progress Notes (Signed)
Transported PT form PACU to ICU with no issues. PT in no distress and stable

## 2022-06-25 NOTE — Progress Notes (Signed)
Initial Nutrition Assessment  DOCUMENTATION CODES:   Severe malnutrition in context of chronic illness  INTERVENTION:  - Advance diet as medically appropriate after OR today.  - Recommend Boost Breeze po TID once diet advanced, each supplement provides 250 kcal and 9 grams of protein - Encourage intake as tolerated. - Recommend daily multivitamin to support micronutrient needs.  - Monitor weight trends.    NUTRITION DIAGNOSIS:   Severe Malnutrition related to chronic illness as evidenced by severe fat depletion, severe muscle depletion, percent weight loss (22% weight loss in 11 months).  GOAL:   Patient will meet greater than or equal to 90% of their needs  MONITOR:   PO intake, Supplement acceptance, Diet advancement, Weight trends  REASON FOR ASSESSMENT:   Consult Assessment of nutrition requirement/status  ASSESSMENT:   37 yo male with of chronic abdomen pain and weight loss presented to the ED for further evaluation. He was being followed by gastroenterology lumbar as outpatient and has had extensive workup in past 3 years for similar presentation. Admitted after being found to have cholelithiasis.   Met with patient, wife, and mother at bedside. Wife supplied most of nutrition history.  UBW reported to be 165# and patient states he last weighed this 3 years ago and has had steady weight loss over that time period.  Per EMR, patient has not been weighed more than 143# since weight history began in 2013.  In March 2023 patient was weighed at 139# and he has dropped over the past year to current weight of 108#. This is a 31# or 22% weight loss in 11 months, which is significant for the time frame.   Wife reports patient has not been able to eat more than a few bites of meals for some time. Pt reportedly used to have pain only after eating, which discouraged intake, but now he has pain all the time.  Recently starting drinking 1 Ensure a day.   Patient is currently NPO  for cholecystectomy today. Agreeable to try Boost Breeze once diet advanced.    Medications reviewed and include: -  Labs reviewed:  HA1C 5.8   NUTRITION - FOCUSED PHYSICAL EXAM:  Flowsheet Row Most Recent Value  Orbital Region Severe depletion  Upper Arm Region Severe depletion  Thoracic and Lumbar Region Severe depletion  Buccal Region Severe depletion  Temple Region Severe depletion  Clavicle Bone Region Severe depletion  Clavicle and Acromion Bone Region Severe depletion  Scapular Bone Region Unable to assess  Dorsal Hand Severe depletion  Patellar Region Severe depletion  Anterior Thigh Region Severe depletion  Posterior Calf Region Severe depletion  Edema (RD Assessment) None  Hair Reviewed  Eyes Reviewed  Mouth Reviewed  Skin Reviewed  Nails Reviewed       Diet Order:   Diet Order             Diet NPO time specified Except for: Ice Chips, Sips with Meds  Diet effective now                   EDUCATION NEEDS:  Education needs have been addressed  Skin:  Skin Assessment: Reviewed RN Assessment  Last BM:  PTA  Height:  Ht Readings from Last 1 Encounters:  06/24/22 5' 8"$  (1.727 m)   Weight:  Wt Readings from Last 1 Encounters:  06/24/22 49.2 kg    BMI:  Body mass index is 16.49 kg/m.  Estimated Nutritional Needs:  Kcal:  1950-2100 kcals Protein:  90-100 grams  Fluid:  >/= 1.9L   Samson Frederic RD, LDN For contact information, refer to [facility].

## 2022-06-25 NOTE — Progress Notes (Signed)
Progress Note: General Surgery Service   Chief Complaint/Subjective: Patient seen in office last week.  Strange situation.  Significant weight loss, severe abdominal pain continue  Objective: Vital signs in last 24 hours: Temp:  [97.5 F (36.4 C)-99.1 F (37.3 C)] 97.5 F (36.4 C) (02/14 0536) Pulse Rate:  [82-120] 109 (02/14 0536) Resp:  [16-18] 18 (02/14 0536) BP: (115-132)/(76-89) 119/85 (02/14 0536) SpO2:  [98 %-100 %] 98 % (02/14 0536) Weight:  [49 kg-49.2 kg] 49.2 kg (02/13 2100)    Intake/Output from previous day: 02/13 0701 - 02/14 0700 In: 1647.3 [I.V.:647.3; IV Piggyback:1000] Out: 350 [Urine:350] Intake/Output this shift: No intake/output data recorded.  GI: Abd Severe tenderness throughout   Lab Results: CBC  Recent Labs    06/24/22 1400  WBC 7.9  HGB 11.7*  HCT 32.8*  PLT 590*   BMET Recent Labs    06/24/22 1400  NA 129*  K 4.0  CL 90*  CO2 26  GLUCOSE 113*  BUN 21*  CREATININE 0.81  CALCIUM 9.3   PT/INR No results for input(s): "LABPROT", "INR" in the last 72 hours. ABG No results for input(s): "PHART", "HCO3" in the last 72 hours.  Invalid input(s): "PCO2", "PO2"  Anti-infectives: Anti-infectives (From admission, onward)    None       Medications: Scheduled Meds: Continuous Infusions:  sodium chloride 75 mL/hr at 06/25/22 0321   PRN Meds:.acetaminophen **OR** acetaminophen, mouth rinse  Assessment/Plan: s/p Procedure(s): LAPAROSCOPIC CHOLECYSTECTOMY 06/25/2022  Notes from office visit: Mr. Merker appears pretty miserable. He has had a lot of testing for this abdominal pain and weight loss without a clear answer as to why he is feeling the way he is. It was noted that he has gallstones. We talked about typical and atypical symptoms for gallbladder problems. I feel his presentation has some typical characteristics but also a lot of atypical characteristics of biliary colic. I offered a laparoscopic cholecystectomy with  intraoperative cholangiogram and diagnostic laparoscopy. I am hopeful this will fix his symptoms, however there is a chance the atypical symptoms are from another etiology and he continues to have problems after surgery. I think cholecystectomy makes sense at this point as there has been extensive evaluation of his complaints with no other clear answer as to why he is having pain. We discussed the surgery itself as well as its risk, benefits, and alternatives. After full discussion all questions answered the patient granted consent to proceed. Our surgery scheduler will reach out to the patient to schedule surgery.   Notes from today: He was unable to schedule surgery.  Pain worsened.  So he presented to the hospital.  I recommended we proceed with laparoscopic cholecystectomy today.  We discussed the procedure, its risks, benefits and alternatives and the patient granted consent to proceed. We will proceed as scheduled.   LOS: 0 days    Felicie Morn, MD  [facility] Use [email or web address] to contact on call provider  Daily Billing: [phone] - High MDM

## 2022-06-25 NOTE — Plan of Care (Signed)
  Problem: Education: Goal: Knowledge of General Education information will improve Description: Including pain rating scale, medication(s)/side effects and non-pharmacologic comfort measures Outcome: Progressing   Problem: Health Behavior/Discharge Planning: Goal: Ability to manage health-related needs will improve Outcome: Progressing   Problem: Clinical Measurements: Goal: Ability to maintain clinical measurements within normal limits will improve Outcome: Progressing Goal: Respiratory complications will improve Outcome: Progressing Goal: Cardiovascular complication will be avoided Outcome: Progressing   Problem: Activity: Goal: Risk for activity intolerance will decrease Outcome: Progressing   Problem: Nutrition: Goal: Adequate nutrition will be maintained Outcome: Progressing   Problem: Coping: Goal: Level of anxiety will decrease Outcome: Progressing   Problem: Elimination: Goal: Will not experience complications related to bowel motility Outcome: Progressing   Problem: Pain Managment: Goal: General experience of comfort will improve Outcome: Progressing   Problem: Safety: Goal: Ability to remain free from injury will improve Outcome: Progressing   Problem: Skin Integrity: Goal: Risk for impaired skin integrity will decrease Outcome: Progressing   

## 2022-06-25 NOTE — Op Note (Signed)
Patient: Gabriel Dalton (Jun 08, 1985, [id number])  Date of Surgery: 06/25/22  Preoperative Diagnosis: GALLSTONES, ABDOMINAL PAIN   Postoperative Diagnosis: Perforated small intestine   Surgical Procedure: Diagnostic laparoscopy, converted to exploratory laparotomy, with small bowel resection and reanastomosis, and temporary abdominal closure  Operative Team Members:  Surgeon(s) and Role:    * Lorilynn Lehr, Nickola Major, MD - Primary    * Ileana Roup, MD - Assisting   Anesthesiologist: Roderic Palau, MD CRNA: Cleda Daub, CRNA   Anesthesia: General   Fluids:  Total I/O In: 2715.1 [I.V.:2110.1; IV Piggyback:605] Out: 81 [id number]  Complications: None  Drains:   Abthera temporary abdominal closure device    Specimen:  ID Type Source Tests Collected by Time Destination  1 : small bowel Tissue PATH GI biopsy SURGICAL PATHOLOGY Gabriel Dalton, Nickola Major, MD 06/25/2022 1740      Disposition:  ICU - intubated and hemodynamically stable.  Plan of Care: Admit to inpatient     Indications for Procedure: Gabriel Dalton is a 37 y.o. male who presented with abdominal pain and negative workup for years.  He did have gallstones, so there was consideration for laparoscopic cholecystectomy, however his symptoms of 60 pound weight loss, severe abdominal pain, food fear, and nausea and vomiting seemed not to be due to cholecystitis.  I originally saw him in the office and offered to perform laparoscopic cholecystectomy as it seemed all other causes have been ruled out by a thorough workup by the gastroenterology team.  I explained during the surgery I would look around to see if there are any other explanations for his pain.  Before surgery could be scheduled, he presented to the emergency room about a week after our office visit.  His pain gotten worse.  Notably his white count was still normal.  We decided to proceed to the operating room urgently for diagnostic  laparoscopy and laparoscopic cholecystectomy.  The procedure itself as well as its risks, benefits and alternatives were discussed.  The risks discussed included but were not limited to the risk of infection, bleeding, damage to nearby structures, and need for additional surgeries or operations..  After a full discussion and all questions answered the patient granted consent to proceed.  Findings: 2 small areas of perforation on the antimesenteric border of the small intestine with feculent peritonitis throughout the abdomen.   Description of Procedure:   On the date stated above the patient was taken operating room suite and placed in supine position.  General endotracheal anesthesia was induced.  A timeout was completed verifying the correct patient, procedure, positioning, and equipment needed for the case.  I made a small incision right above the umbilicus and dissected down to the fascia the anterior abdominal wall.  It was grasped and elevated with a Coker clamp and a Veress needle was inserted into the abdomen.  The abdomen was insufflated to 15 mmHg.  The 5 mm trocar was inserted in this position and the abdomen was inspected.  There was feculent peritonitis everywhere.  Two additional 5 mm trocars were placed one in the subxiphoid region and one in the right upper quadrant.  I worked to inspect the duodenum and the stomach.  These appeared normal.  I turned my attention down to the pelvis.  There were dense adhesions between the abdominal wall and the small bowel.  There was feculent emanating from the lower abdomen.  I could not see anything laparoscopically so we converted to an open operation.  I made a  midline laparotomy incision and entered the abdomen without any trauma to the underlying viscera.  I worked to mobilize the small intestine.  There were dense adhesions between the small intestine and the pelvis.  These were able to be finger fractured and divided.  I worked to run the small  intestine from the ligament of Treitz to the terminal ileum.  There was dilated bowel proximally and decompressed bowel distally.  There appeared to be a transition point near some dense adhesions where the bowel turned back on itself.  There were 2 perforations on the antimesenteric border of the small bowel just proximal to this area.  I decided perform a small bowel resection incorporating the 2 perforations and the area of concern for possible transition point of a bowel obstruction.  The bowel was stapled proximally and distally using the GIA 75 mm stapler.  The mesentery was divided using the LigaSure Atlas.  A stapled anastomosis was created.  Enterotomies were made in the antimesenteric border of the small intestine using electrocautery.  The 75 mm GIA stapler was inserted into both limbs of bowel and fired to create the anastomosis.  The common enterotomy was closed with the GIA 75 mm stapler.  The corners of the staple line were oversewn and the crotch of the staple line was oversewn with Vicryl suture to reinforce the anastomosis.  The abdomen was inspected in detail.  The rectum and colon appeared normal.  There were some questionable areas of perfusion to the small bowel distal to the small bowel resection however it appeared viable.  The gallbladder appeared normal.  The duodenum and stomach appeared normal.  The liver appeared normal.  The abdomen was then irrigated with multiple liters of warm irrigation.  Despite our attempts to clear the infection from the abdomen, there was still significant contamination throughout the abdomen.  I decided to place a ABThera wound VAC.  The ABThera was brought into the field and applied to the abdomen.  It was connected to suction and held suction.  The patient will remain intubated and return to the operating room later this week for washout and reinspection of the questionable areas of ischemia, reinspection of the anastomosis, and abdominal closure once  appropriate.  All sponge needle counts were correct at the end of this case.  At the end of the case we reviewed the infection status of the case. Patient: Gabriel Dalton Emergency General Surgery Service Patient Case: Urgent Infection Present At Time Of Surgery (PATOS): Feculent peritonitis  Louanna Raw, MD General, Bariatric, & Minimally Invasive Surgery [facility], [place]

## 2022-06-25 NOTE — Progress Notes (Signed)
Pharmacy Antibiotic Note  Gabriel Dalton is a 37 y.o. male admitted on 06/24/2022 with intraabdominal infection s/p lap chole 2/14.  Pharmacy has been consulted for Zosyn dosing.  Plan: Zosyn 3.375gm IV q8h (4hr extended infusions) No dose adjustments anticipated.  Pharmacy will sign off and monitor peripherally via electronic surveillance software for any changes in renal function or micro data.   Height: 5' 8"$  (172.7 cm) Weight: 49.2 kg (108 lb 7.5 oz) IBW/kg (Calculated) : 68.4  Temp (24hrs), Avg:98.1 F (36.7 C), Min:97.5 F (36.4 C), Max:98.7 F (37.1 C)  Recent Labs  Lab 06/24/22 1400  WBC 7.9  CREATININE 0.81    Estimated Creatinine Clearance: 88.6 mL/min (by C-G formula based on SCr of 0.81 mg/dL).    Allergies  Allergen Reactions   Aspirin Other (See Comments)    Cannot take due to stomach ulcers    Antimicrobials this admission: 2/14 Ancef x 1 2/14 Zosyn >>  Dose adjustments this admission:  Microbiology results:  Thank you for allowing pharmacy to be a part of this patient's care.  Peggyann Juba, PharmD, BCPS Pharmacy: [phone] 06/25/2022 6:33 PM

## 2022-06-26 ENCOUNTER — Observation Stay: Payer: Self-pay

## 2022-06-26 ENCOUNTER — Encounter (HOSPITAL_COMMUNITY): Payer: Self-pay | Admitting: Surgery

## 2022-06-26 DIAGNOSIS — E871 Hypo-osmolality and hyponatremia: Secondary | ICD-10-CM | POA: Diagnosis present

## 2022-06-26 DIAGNOSIS — F17210 Nicotine dependence, cigarettes, uncomplicated: Secondary | ICD-10-CM | POA: Diagnosis present

## 2022-06-26 DIAGNOSIS — K659 Peritonitis, unspecified: Secondary | ICD-10-CM

## 2022-06-26 DIAGNOSIS — E43 Unspecified severe protein-calorie malnutrition: Secondary | ICD-10-CM | POA: Diagnosis present

## 2022-06-26 DIAGNOSIS — E8809 Other disorders of plasma-protein metabolism, not elsewhere classified: Secondary | ICD-10-CM | POA: Diagnosis not present

## 2022-06-26 DIAGNOSIS — J9601 Acute respiratory failure with hypoxia: Secondary | ICD-10-CM | POA: Diagnosis not present

## 2022-06-26 DIAGNOSIS — R1010 Upper abdominal pain, unspecified: Secondary | ICD-10-CM | POA: Diagnosis not present

## 2022-06-26 DIAGNOSIS — N400 Enlarged prostate without lower urinary tract symptoms: Secondary | ICD-10-CM | POA: Diagnosis not present

## 2022-06-26 DIAGNOSIS — K651 Peritoneal abscess: Secondary | ICD-10-CM | POA: Diagnosis present

## 2022-06-26 DIAGNOSIS — Z978 Presence of other specified devices: Secondary | ICD-10-CM | POA: Diagnosis not present

## 2022-06-26 DIAGNOSIS — J9811 Atelectasis: Secondary | ICD-10-CM | POA: Diagnosis not present

## 2022-06-26 DIAGNOSIS — E876 Hypokalemia: Secondary | ICD-10-CM | POA: Diagnosis not present

## 2022-06-26 DIAGNOSIS — K631 Perforation of intestine (nontraumatic): Secondary | ICD-10-CM | POA: Diagnosis present

## 2022-06-26 DIAGNOSIS — J9600 Acute respiratory failure, unspecified whether with hypoxia or hypercapnia: Secondary | ICD-10-CM | POA: Diagnosis not present

## 2022-06-26 DIAGNOSIS — R200 Anesthesia of skin: Secondary | ICD-10-CM | POA: Diagnosis not present

## 2022-06-26 DIAGNOSIS — E860 Dehydration: Secondary | ICD-10-CM | POA: Diagnosis present

## 2022-06-26 DIAGNOSIS — E162 Hypoglycemia, unspecified: Secondary | ICD-10-CM | POA: Diagnosis not present

## 2022-06-26 DIAGNOSIS — R188 Other ascites: Secondary | ICD-10-CM | POA: Diagnosis present

## 2022-06-26 DIAGNOSIS — K567 Ileus, unspecified: Secondary | ICD-10-CM | POA: Diagnosis not present

## 2022-06-26 DIAGNOSIS — R64 Cachexia: Secondary | ICD-10-CM | POA: Diagnosis present

## 2022-06-26 DIAGNOSIS — J90 Pleural effusion, not elsewhere classified: Secondary | ICD-10-CM | POA: Diagnosis present

## 2022-06-26 DIAGNOSIS — D638 Anemia in other chronic diseases classified elsewhere: Secondary | ICD-10-CM | POA: Diagnosis present

## 2022-06-26 DIAGNOSIS — R109 Unspecified abdominal pain: Secondary | ICD-10-CM | POA: Diagnosis present

## 2022-06-26 DIAGNOSIS — Z681 Body mass index (BMI) 19 or less, adult: Secondary | ICD-10-CM | POA: Diagnosis not present

## 2022-06-26 DIAGNOSIS — K8020 Calculus of gallbladder without cholecystitis without obstruction: Secondary | ICD-10-CM | POA: Diagnosis present

## 2022-06-26 DIAGNOSIS — D649 Anemia, unspecified: Secondary | ICD-10-CM | POA: Diagnosis not present

## 2022-06-26 DIAGNOSIS — G8929 Other chronic pain: Secondary | ICD-10-CM | POA: Diagnosis present

## 2022-06-26 DIAGNOSIS — A419 Sepsis, unspecified organism: Secondary | ICD-10-CM | POA: Diagnosis present

## 2022-06-26 DIAGNOSIS — L89152 Pressure ulcer of sacral region, stage 2: Secondary | ICD-10-CM | POA: Diagnosis not present

## 2022-06-26 HISTORY — DX: Peritonitis, unspecified: K65.9

## 2022-06-26 LAB — COMPREHENSIVE METABOLIC PANEL
ALT: 14 U/L (ref 0–44)
AST: 18 U/L (ref 15–41)
Albumin: 2.7 g/dL — ABNORMAL LOW (ref 3.5–5.0)
Alkaline Phosphatase: 50 U/L (ref 38–126)
Anion gap: 10 (ref 5–15)
BUN: 17 mg/dL (ref 6–20)
CO2: 22 mmol/L (ref 22–32)
Calcium: 8.5 mg/dL — ABNORMAL LOW (ref 8.9–10.3)
Chloride: 99 mmol/L (ref 98–111)
Creatinine, Ser: 0.76 mg/dL (ref 0.61–1.24)
GFR, Estimated: >60 mL/min (ref >60)
Glucose, Bld: 79 mg/dL (ref 70–99)
Potassium: 4.5 mmol/L (ref 3.5–5.1)
Sodium: 131 mmol/L — ABNORMAL LOW (ref 135–145)
Total Bilirubin: 1.1 mg/dL (ref 0.3–1.2)
Total Protein: 5.8 g/dL — ABNORMAL LOW (ref 6.5–8.1)

## 2022-06-26 LAB — GLUCOSE, CAPILLARY
Glucose-Capillary: 71 mg/dL (ref 70–99)
Glucose-Capillary: 77 mg/dL (ref 70–99)
Glucose-Capillary: 79 mg/dL (ref 70–99)
Glucose-Capillary: 69 mg/dL — ABNORMAL LOW (ref 70–99)
Glucose-Capillary: 98 mg/dL (ref 70–99)
Glucose-Capillary: 55 mg/dL — ABNORMAL LOW (ref 70–99)
Glucose-Capillary: 71 mg/dL (ref 70–99)

## 2022-06-26 LAB — CBC WITH DIFFERENTIAL/PLATELET
Abs Immature Granulocytes: 0.32 10*3/uL — ABNORMAL HIGH (ref 0.00–0.07)
Basophils Absolute: 0 10*3/uL (ref 0.0–0.1)
Basophils Relative: 0 %
Eosinophils Absolute: 0 10*3/uL (ref 0.0–0.5)
Eosinophils Relative: 0 %
HCT: 32.6 % — ABNORMAL LOW (ref 39.0–52.0)
Hemoglobin: 11.2 g/dL — ABNORMAL LOW (ref 13.0–17.0)
Immature Granulocytes: 2 %
Lymphocytes Relative: 6 %
Lymphs Abs: 1 10*3/uL (ref 0.7–4.0)
MCH: 29.2 pg (ref 26.0–34.0)
MCHC: 34.4 g/dL (ref 30.0–36.0)
MCV: 84.9 fL (ref 80.0–100.0)
Monocytes Absolute: 0.8 10*3/uL (ref 0.1–1.0)
Monocytes Relative: 5 %
Neutro Abs: 13.9 10*3/uL — ABNORMAL HIGH (ref 1.7–7.7)
Neutrophils Relative %: 87 %
Platelets: 451 10*3/uL — ABNORMAL HIGH (ref 150–400)
RBC: 3.84 MIL/uL — ABNORMAL LOW (ref 4.22–5.81)
RDW: 14.5 % (ref 11.5–15.5)
WBC: 16.1 10*3/uL — ABNORMAL HIGH (ref 4.0–10.5)
nRBC: 0 % (ref 0.0–0.2)

## 2022-06-26 LAB — BLOOD GAS, ARTERIAL
Acid-base deficit: 0.3 mmol/L (ref 0.0–2.0)
Bicarbonate: 24.1 mmol/L (ref 20.0–28.0)
Drawn by: 270211
FIO2: 80 %
MECHVT: 540 mL
O2 Saturation: 99.7 %
PEEP: 5 cmH2O
Patient temperature: 37
RATE: 15 resp/min
pCO2 arterial: 38 mmHg (ref 32–48)
pH, Arterial: 7.41 (ref 7.35–7.45)
pO2, Arterial: 303 mmHg — ABNORMAL HIGH (ref 83–108)

## 2022-06-26 LAB — VITAMIN D 25 HYDROXY (VIT D DEFICIENCY, FRACTURES)

## 2022-06-26 LAB — MAGNESIUM: Magnesium: 2 mg/dL (ref 1.7–2.4)

## 2022-06-26 LAB — TRIGLYCERIDES: Triglycerides: 175 mg/dL — ABNORMAL HIGH (ref <150)

## 2022-06-26 LAB — PHOSPHORUS: Phosphorus: 2.9 mg/dL (ref 2.5–4.6)

## 2022-06-26 LAB — MRSA NEXT GEN BY PCR, NASAL: MRSA by PCR Next Gen: NOT DETECTED

## 2022-06-26 MED ORDER — SODIUM CHLORIDE 0.9% FLUSH
10.0000 mL | INTRAVENOUS | Status: DC | PRN
  Administered 2022-06-27: 10 mL

## 2022-06-26 MED ORDER — ACETAMINOPHEN 10 MG/ML IV SOLN
1000.0000 mg | Freq: Four times a day (QID) | INTRAVENOUS | Status: AC
  Administered 2022-06-26 – 2022-06-27 (×4): 1000 mg via INTRAVENOUS
  Filled 2022-06-26 (×4): qty 100

## 2022-06-26 MED ORDER — SODIUM CHLORIDE 0.9 % IV SOLN: INTRAVENOUS | Status: DC | PRN

## 2022-06-26 MED ORDER — DEXTROSE 50 % IV SOLN
12.5000 g | Freq: Once | INTRAVENOUS | Status: AC
  Administered 2022-06-26: 12.5 g via INTRAVENOUS
  Filled 2022-06-26: qty 50

## 2022-06-26 MED ORDER — NOREPINEPHRINE 4 MG/250ML-% IV SOLN
2.0000 ug/min | INTRAVENOUS | Status: DC
  Filled 2022-06-26: qty 250

## 2022-06-26 MED ORDER — SODIUM CHLORIDE 0.9% FLUSH
10.0000 mL | Freq: Two times a day (BID) | INTRAVENOUS | Status: DC
  Administered 2022-06-26 – 2022-06-27 (×2): 10 mL
  Administered 2022-06-28: 30 mL
  Administered 2022-06-28 – 2022-07-08 (×5): 10 mL
  Administered 2022-07-08: 20 mL
  Administered 2022-07-09 – 2022-07-11 (×5): 10 mL
  Administered 2022-07-12: 5 mL

## 2022-06-26 MED ORDER — DEXMEDETOMIDINE HCL IN NACL 200 MCG/50ML IV SOLN
0.0000 ug/kg/h | INTRAVENOUS | Status: DC
  Administered 2022-06-26: 0.4 ug/kg/h via INTRAVENOUS
  Filled 2022-06-26: qty 50

## 2022-06-26 MED ORDER — INSULIN ASPART 100 UNIT/ML IJ SOLN: 0.0000 [IU] | Freq: Three times a day (TID) | INTRAMUSCULAR | Status: DC

## 2022-06-26 MED ORDER — SODIUM CHLORIDE 0.9 % IV BOLUS
500.0000 mL | Freq: Once | INTRAVENOUS | Status: AC
  Administered 2022-06-26: 500 mL via INTRAVENOUS

## 2022-06-26 MED ORDER — SODIUM CHLORIDE 0.9 % IV SOLN: INTRAVENOUS | Status: DC

## 2022-06-26 MED ORDER — MIDAZOLAM-SODIUM CHLORIDE 100-0.9 MG/100ML-% IV SOLN
0.0000 mg/h | INTRAVENOUS | Status: DC
  Administered 2022-06-26: 2 mg/h via INTRAVENOUS
  Filled 2022-06-26: qty 100

## 2022-06-26 MED ORDER — TRAVASOL 10 % IV SOLN
INTRAVENOUS | Status: AC
  Filled 2022-06-26: qty 300

## 2022-06-26 MED ORDER — ENOXAPARIN SODIUM 40 MG/0.4ML IJ SOSY
40.0000 mg | PREFILLED_SYRINGE | Freq: Every day | INTRAMUSCULAR | Status: DC
  Administered 2022-06-26 – 2022-07-02 (×7): 40 mg via SUBCUTANEOUS
  Filled 2022-06-26 (×7): qty 0.4

## 2022-06-26 MED ORDER — MIDAZOLAM BOLUS VIA INFUSION
0.0000 mg | INTRAVENOUS | Status: DC | PRN
  Administered 2022-06-26 – 2022-06-27 (×6): 1 mg via INTRAVENOUS
  Administered 2022-06-27: 2 mg via INTRAVENOUS

## 2022-06-26 MED ORDER — SODIUM CHLORIDE 0.9 % IV SOLN: 250.0000 mL | INTRAVENOUS | Status: DC

## 2022-06-26 NOTE — Progress Notes (Addendum)
PHARMACY - TOTAL PARENTERAL NUTRITION CONSULT NOTE   Indication: malnutrition, bowel perforation, expected prolonged ileus  Patient Measurements: Height: 5' 8"$  (172.7 cm) Weight: 49.8 kg (109 lb 12.6 oz) IBW/kg (Calculated) : 68.4 TPN AdjBW (KG): 49.8 Body mass index is 16.69 kg/m.  Assessment: 29 yoM admitted with abdominal pain, weight loss.  Underwent laparoscopic cholecystectomy on 2/14 and was found with multiple bowel perforations and stool contamination of abdomen, converted to exploratory laparotomy, with small bowel resection and reanastomosis, and temporary abdominal closure. Remains intubated with open abdomen and plan for repeat washout later this week.  Per CCS, suspect he may have prolonged post op ileus.    Glucose / Insulin: No noted hx DM.  Glucose 79. Electrolytes: Na low at 131, others WNL including K, Mag, Phos, CorrCa 9.54 Renal: SCr < 1 Difficult urethral catheterization performed by urology on 2/14 Hepatic: WNL, low albumin 2.7, TG 175 Intake / Output; MIVF: mIVF:  NS at 75 ml/hr Note:  propofol at 65 mcg/kg/min GI Imaging: GI Surgeries / Procedures:  2/14 OR: small bowel resection and reanastomosis, open abdomen 2/16 OR: washout and closure planned  Central access: PICC line ordered 2/15 TPN start date: 2/15  Nutritional Goals: Goal TPN rate is 80 mL/hr to provide 96 g of protein and 2039 kcals per day.  RD Assessment: Estimated Needs Total Energy Estimated Needs: 1950-2100 kcals Total Protein Estimated Needs: 90-100 grams Total Fluid Estimated Needs: >/= 1.9L  Current Nutrition:  NPO Propofol at 65 mcg/kg/min (~19 ml/hr) at 1.1 kcal/ml will provide 506 kcal / 24 hrs.  Planning to transition from Propofol to Precedex.    Plan:  Start TPN at 25 mL/hr at 1800 - starting with low rate d/t high risk for refeeding syndrome.  Goal starting rate is 10-20 Kcal/kg/day and 100-150g dextrose.  This rate provides 12.7 kcal/kg/day and 108g dextrose.  Advance TPN  rate slowly if tolerating and elytes are stable.   Electrolytes in TPN: Na 75 mEq/L, K 733mq/L, Ca 538m/L, Mg 33m52mL, and Phos 133m51mL. Cl:Ac 1:1 Thiamine 100mg64mTPN x5 days (2/15 - 2/19) Add standard MVI and trace elements to TPN Initiate Sensitive q8h SSI and adjust as needed  Reduce MIVF to 50 mL/hr at 1800 Monitor TPN labs on Mon/Thurs, and prn  ChrisGretta ArabmD, BCPS [facility] main pharmacy [phone]/2024 7:31 AM

## 2022-06-26 NOTE — Progress Notes (Signed)
NAME:  Gabriel Dalton, MRN:  [id number], DOB:  [birth year], LOS: 0 ADMISSION DATE:  06/24/2022, CONSULTATION DATE:  06/25/22 REFERRING MD:  Erlinda Hong - TRH , CHIEF COMPLAINT: endotracheally intubated post op    History of Present Illness:   37 yo M chronic abd pain, wt loss, gallstones (for which he saw CCS outpt 2/8 and chole was offered but not yet scheduled) presented to [facility] ED 2/13 with CC abd pain.   CCS was consulted 2/13 and pt was posted for lap chole 2/14. Op course c/b  identification of numerous small bowel perforations and was converted to open surgery, with evacuation of 1L bowel contents.   PCCM was called peri op as plan post op has evolved to remain intubated and transfer to ICU   Pertinent  Medical History  Chronic abd pain   Significant Hospital Events: Including procedures, antibiotic start and stop dates in addition to other pertinent events   2/13 admit. CCS consult for chole eval given known hx gallstones  2/14 intended lap chole --> ex lap with identification of small bowel perfs req resection. To ICU post op intubated. Added zosyn  2/15 remains intubated sedated w open abd   Interim History / Subjective:  POD1   Objective   Blood pressure 122/74, pulse (!) 110, temperature 98.7 F (37.1 C), temperature source Oral, resp. rate 15, height 5' 8"$  (1.727 m), weight 49.8 kg, SpO2 100 %.    Vent Mode: PRVC FiO2 (%):  [40 %-100 %] 40 % Set Rate:  [15 bmp] 15 bmp Vt Set:  [540 mL] 540 mL PEEP:  [5 cmH20] 5 cmH20 Plateau Pressure:  [12 cmH20-14 cmH20] 13 cmH20   Intake/Output Summary (Last 24 hours) at 06/26/2022 1015 Last data filed at 06/26/2022 1013 Gross per 24 hour  Intake 5004.38 ml  Output 2130 ml  Net 2874.38 ml   Filed Weights   06/24/22 2100 06/25/22 2040 06/25/22 2053  Weight: 49.2 kg 48.8 kg 49.8 kg    Examination: General: Chronically ill adult M intubated sedated  HENT: NCAT pink mm ETT secure  Lungs:CTAb, mechanically ventilated   Cardiovascular: rrr s1s2 cap refill < 3 sec  Abdomen: thin soft. Open abdomen with wound vac, good seal  Extremities: no acute joint deformity. Symmetrically decr muscle mass  Neuro: Sedated. Pupils are 83m and responsive   Resolved Hospital Problem list     Assessment & Plan:   Endotracheally intubated Small bowel perf due to SBO s/p resection and reanastomosis  Sepsis due to Feculent peritonitis  Cholelithiasis  Severe protein calorie malnutrition Unintentional wt loss  BPH Anemia Thrombocytosis  Hyponatremia, mild  P -remains intubated and sedated w open abdomen -VAP, pulm hygiene, PRN CXR ABG  -CCS following post op -- sounds like plan is return OR 2/16 -zosyn -NPO, PICC to be inserted for TPN  -foley inserted by uro     Best Practice (right click and "Reselect all SmartList Selections" daily)   Diet/type: NPO DVT prophylaxis: SCD GI prophylaxis: H2B Lines: N/A Foley:  Yes, and it is still needed Code Status:  full code Last date of multidisciplinary goals of care discussion [family updated at bedside 2/15]  Labs   CBC: Recent Labs  Lab 06/24/22 1400 06/25/22 2016 06/26/22 0538  WBC 7.9 7.3 16.1*  NEUTROABS 5.2  --  13.9*  HGB 11.7* 11.1* 11.2*  HCT 32.8* 32.8* 32.6*  MCV 83.0 86.3 84.9  PLT 590* 537* 451*    Basic Metabolic Panel: Recent Labs  Lab 06/24/22 1400 06/26/22 0538  NA 129* 131*  K 4.0 4.5  CL 90* 99  CO2 26 22  GLUCOSE 113* 79  BUN 21* 17  CREATININE 0.81 0.76  CALCIUM 9.3 8.5*  MG  --  2.0  PHOS  --  2.9   GFR: Estimated Creatinine Clearance: 90.8 mL/min (by C-G formula based on SCr of 0.76 mg/dL). Recent Labs  Lab 06/24/22 1400 06/25/22 2016 06/26/22 0538  WBC 7.9 7.3 16.1*    Liver Function Tests: Recent Labs  Lab 06/24/22 1400 06/26/22 0538  AST 19 18  ALT 22 14  ALKPHOS 82 50  BILITOT 0.5 1.1  PROT 7.0 5.8*  ALBUMIN 3.2* 2.7*   Recent Labs  Lab 06/24/22 1400  LIPASE 30   No results for input(s):  "AMMONIA" in the last 168 hours.  ABG    Component Value Date/Time   PHART 7.41 06/26/2022 0823   PCO2ART 38 06/26/2022 0823   PO2ART 303 (H) 06/26/2022 0823   HCO3 24.1 06/26/2022 0823   ACIDBASEDEF 0.3 06/26/2022 0823   O2SAT 99.7 06/26/2022 0823     Coagulation Profile: No results for input(s): "INR", "PROTIME" in the last 168 hours.  Cardiac Enzymes: No results for input(s): "CKTOTAL", "CKMB", "CKMBINDEX", "TROPONINI" in the last 168 hours.  HbA1C: Hgb A1c MFr Bld  Date/Time Value Ref Range Status  06/24/2022 09:42 PM 5.8 (H) 4.8 - 5.6 % Final    Comment:    (NOTE) Pre diabetes:          5.7%-6.4%  Diabetes:              >6.4%  Glycemic control for   <7.0% adults with diabetes     CBG: Recent Labs  Lab 06/25/22 2337 06/26/22 0423 06/26/22 0752  GLUCAP 79 71 77    CRITICAL CARE Performed by: Cristal Generous   Total critical care time: 37 minutes  Critical care time was exclusive of separately billable procedures and treating other patients. Critical care was necessary to treat or prevent imminent or life-threatening deterioration.  Critical care was time spent personally by me on the following activities: development of treatment plan with patient and/or surrogate as well as nursing, discussions with consultants, evaluation of patient's response to treatment, examination of patient, obtaining history from patient or surrogate, ordering and performing treatments and interventions, ordering and review of laboratory studies, ordering and review of radiographic studies, pulse oximetry and re-evaluation of patient's condition.  Eliseo Gum MSN, AGACNP-BC [place] for pager 06/26/2022, 10:15 AM

## 2022-06-26 NOTE — Progress Notes (Signed)
Nutrition Follow-up  DOCUMENTATION CODES:   Severe malnutrition in context of chronic illness  INTERVENTION:  - PICC to be placed and starting TPN tonight.   - Per discussion with pharmacist, starting TPN at 81m/hr which will provide 30g of protein, 108g dextrose, 150 [place] kcals (24% total kcal) = 637 kcal (12.7kcal/kg) - TPN management per pharmacy.   - Monitor magnesium, potassium, and phosphorus BID for at least 5 days, MD to replete as needed, as pt is at risk for refeeding syndrome given severe malnutrition with a 22% weight loss in 11 months.  - 1063mthiamine in TPN.  - Would recommend advancing by only 200-300 kcal per day due to refeeding risk. If electrolytes drop would consider holding at initial TPN rate of another day.  - Monitor weight trends.    NUTRITION DIAGNOSIS:   Severe Malnutrition related to chronic illness as evidenced by severe fat depletion, severe muscle depletion, percent weight loss (22% weight loss in 11 months). *ongoing  GOAL:   Patient will meet greater than or equal to 90% of their needs *unmet, starting TPN  MONITOR:   PO intake, Supplement acceptance, Diet advancement, Weight trends  REASON FOR ASSESSMENT:   Consult New TPN/TNA  ASSESSMENT:   [age]o male with of chronic abdomen pain and weight loss presented to the ED for further evaluation. He was being followed by gastroenterology lumbar as outpatient and has had extensive workup in past 3 years for similar presentation. Admitted after being found to have cholelithiasis.  2/13 Admit 2/14 ex-lap, found to have SB perf, s/p small bowel resection and reanastomosis, and temporary abdominal closure   Patient taken to OR yesterday and found to have SB perforation from SBO.   Surgery concerned for possible prolonged post-op ileus. PICC to be inserted and plan to start TPN tonight.   Patient discussed with pharmacist. He remains at high risk for refeeding syndrome so starting at TPN at  2536mr and adding thiamine.  Will need close monitoring of electrolytes.    Medications reviewed and include: Colace, Miralax, Zosyn Fentanyl Propfol (being weaned off, plan for transition to Precedex)  Labs reviewed:  Na 131 Triglycerides 175   Diet Order:   Diet Order             Diet NPO time specified  Diet effective now                   EDUCATION NEEDS:   Education needs have been addressed  Skin:  Skin Assessment: Reviewed RN Assessment  Last BM:  PTA  Height:  Ht Readings from Last 1 Encounters:  06/25/22 5' 8"$  (1.727 m)   Weight:  Wt Readings from Last 1 Encounters:  06/24/22 49.2 kg      BMI:  Body mass index is 16.49 kg/m.  Estimated Nutritional Needs:  Kcal:  1950-2100 kcals Protein:  90-100 grams Fluid:  >/= 1.9L    Gabriel Dalton, LDN For contact information, refer to [facility]

## 2022-06-26 NOTE — Progress Notes (Signed)
1 Day Post-Op  Subjective: Patient currently sedated on vent.  Findings in OR discussed with wife at bedside again as she was so emotional yesterday she said she would like to go over the findings again.  RN present as well as reports not acute issues.     Objective: Vital signs in last 24 hours: Temp:  [97.7 F (36.5 C)-99 F (37.2 C)] 98.7 F (37.1 C) (02/15 0828) Pulse Rate:  [102-139] 110 (02/15 0844) Resp:  [13-38] 14 (02/15 0844) BP: (106-156)/(81-103) 129/88 (02/15 0800) SpO2:  [98 %-100 %] 100 % (02/15 0844) FiO2 (%):  [60 %-100 %] 60 % (02/15 0844) Weight:  [48.8 kg-49.8 kg] 49.8 kg (02/14 2053)    Intake/Output from previous day: 02/14 0701 - 02/15 0700 In: 4729.5 [I.V.:3923.9; IV Piggyback:805.6] Out: 1180 [Urine:580; Emesis/NG output:50; Drains:500; Blood:50] Intake/Output this shift: Total I/O In: 274.9 [I.V.:266; IV Piggyback:8.9] Out: -   PE: Gen: NAD, sedated on vent Heart: regular Lungs: CTAB, on vent Abd: soft, ABthera vac in place with open abdomen.  VAC with minimal serous drainage noted.  NGT in place with bilious output.    Lab Results:  Recent Labs    06/25/22 2016 06/26/22 0538  WBC 7.3 16.1*  HGB 11.1* 11.2*  HCT 32.8* 32.6*  PLT 537* 451*   BMET Recent Labs    06/24/22 1400 06/26/22 0538  NA 129* 131*  K 4.0 4.5  CL 90* 99  CO2 26 22  GLUCOSE 113* 79  BUN 21* 17  CREATININE 0.81 0.76  CALCIUM 9.3 8.5*   PT/INR No results for input(s): "LABPROT", "INR" in the last 72 hours. CMP     Component Value Date/Time   NA 131 (L) 06/26/2022 0538   K 4.5 06/26/2022 0538   CL 99 06/26/2022 0538   CO2 22 06/26/2022 0538   GLUCOSE 79 06/26/2022 0538   BUN 17 06/26/2022 0538   CREATININE 0.76 06/26/2022 0538   CALCIUM 8.5 (L) 06/26/2022 0538   PROT 5.8 (L) 06/26/2022 0538   ALBUMIN 2.7 (L) 06/26/2022 0538   AST 18 06/26/2022 0538   ALT 14 06/26/2022 0538   ALKPHOS 50 06/26/2022 0538   BILITOT 1.1 06/26/2022 0538   GFRNONAA  >60 06/26/2022 0538   GFRAA >60 04/28/2019 1313   Lipase     Component Value Date/Time   LIPASE 30 06/24/2022 1400       Studies/Results: [place] EKG SITE RITE  Result Date: 06/26/2022 If Site Rite image not attached, placement could not be confirmed due to current cardiac rhythm.  Portable Chest x-ray  Result Date: 06/25/2022 CLINICAL DATA:  Intubated EXAM: PORTABLE CHEST 1 VIEW COMPARISON:  05/31/2022 FINDINGS: Endotracheal tube tip is about 5.6 cm superior to the carina. Esophageal tube tip below the diaphragm. Clear lung fields. Normal cardiac size. No pneumothorax. Scoliosis of the spine IMPRESSION: Endotracheal tube tip about 5.6 cm superior to carina. Clear lungs. Electronically Signed   By: Donavan Foil M.D.   On: 06/25/2022 19:57   CT Head Wo Contrast  Result Date: 06/24/2022 CLINICAL DATA:  Bilateral lower extremity weakness x2 months. EXAM: CT HEAD WITHOUT CONTRAST TECHNIQUE: Contiguous axial images were obtained from the base of the skull through the vertex without intravenous contrast. RADIATION DOSE REDUCTION: This exam was performed according to the departmental dose-optimization program which includes automated exposure control, adjustment of the mA and/or kV according to patient size and/or use of iterative reconstruction technique. COMPARISON:  None Available. FINDINGS: Brain: No evidence of  acute infarction, hemorrhage, hydrocephalus, extra-axial collection or mass lesion/mass effect. Vascular: No hyperdense vessel or unexpected calcification. Skull: Normal. Negative for fracture or focal lesion. Sinuses/Orbits: No acute finding. Other: None. IMPRESSION: No acute intracranial pathology. Electronically Signed   By: Virgina Norfolk M.D.   On: 06/24/2022 19:44   MR Lumbar Spine W Wo Contrast  Result Date: 06/24/2022 CLINICAL DATA:  Provided history: Low back pain, symptoms persist with greater than 6 weeks treatment. Additional history provided by the scanning technologist:  The patient reports back pain, bilateral lower extremity numbness and decreased sensation (right greater than left). Right leg slightly weaker than left. EXAM: MRI LUMBAR SPINE WITHOUT AND WITH CONTRAST TECHNIQUE: Multiplanar and multiecho pulse sequences of the lumbar spine were obtained without and with intravenous contrast. CONTRAST:  17m GADAVIST GADOBUTROL 1 MMOL/ML IV SOLN COMPARISON:  Lumbar spine radiographs 03/23/2019. FINDINGS: Segmentation: 5 lumbar vertebrae. The caudal most well-formed interval disc space is designated L5-S1. Alignment: Levocurvature of the lumbar spine. No significant spondylolisthesis. Vertebrae: Vertebral body height is maintained. Mild degenerative endplate signal changes at L2-L3 and L3-L4. Conus medullaris and cauda equina: Conus extends to the L2 level. No signal abnormality identified within the visualized distal spinal cord. No pathologic enhancement of the visualized distal spinal cord or cauda equina nerve roots. Paraspinal and other soft tissues: No paraspinal mass or collection. No acute finding within included portions of the abdomen/retroperitoneum. Disc levels: Mild disc degeneration at L5-S1. T12-L1: No significant disc herniation or stenosis. L1-L2: No significant disc herniation or stenosis. L2-L3: No significant disc herniation or stenosis. L3-L4: Slight disc bulge. No significant spinal canal or foraminal stenosis. L4-L5: No significant disc herniation or stenosis. L5-S1: Small left subarticular disc protrusion (at site of posterior annular fissure) (for as seen on series 5, image 9) (series 8, image 37). The disc protrusion results in slight left subarticular narrowing, contacting the descending left S1 nerve root. No significant central canal stenosis or neural foraminal narrowing. IMPRESSION: Lumbar spondylosis, as outlined and with findings most notably as follows. At L5-S1, there is mild disc degeneration. Small left subarticular disc protrusion (at site of  posterior annular fissure). The disc protrusion results in slight left subarticular narrowing, and contacts the descending left S1 nerve root. No significant central canal stenosis. Levocurvature of the lumbar spine. Electronically Signed   By: KKellie SimmeringD.O.   On: 06/24/2022 19:07   [place]Abdomen Limited RUQ (LIVER/GB)  Result Date: 06/24/2022 CLINICAL DATA:  Right upper quadrant abdominal pain. EXAM: ULTRASOUND ABDOMEN LIMITED RIGHT UPPER QUADRANT COMPARISON:  June 03, 2022. FINDINGS: Gallbladder: Sludge and probable cholelithiasis is noted within the gallbladder lumen. No gallbladder wall thickening or pericholecystic fluid is noted. No sonographic Murphy's sign is noted. Common bile duct: Diameter: 3 mm which is within normal limits. Liver: No focal lesion identified. Within normal limits in parenchymal echogenicity. Portal vein is patent on color Doppler imaging with normal direction of blood flow towards the liver. Other: None. IMPRESSION: Cholelithiasis and gallbladder sludge is noted without evidence of cholecystitis. Electronically Signed   By: JMarijo ConceptionM.D.   On: 06/24/2022 15:11    Anti-infectives: Anti-infectives (From admission, onward)    Start     Dose/Rate Route Frequency Ordered Stop   06/26/22 0000  piperacillin-tazobactam (ZOSYN) IVPB 3.375 g        3.375 g 12.5 mL/hr over 240 Minutes Intravenous Every 8 hours 06/25/22 1832     06/25/22 1815  piperacillin-tazobactam (ZOSYN) IVPB 3.375 g  3.375 g 100 mL/hr over 30 Minutes Intravenous  Once 06/25/22 1802 06/25/22 1830   06/25/22 1756  piperacillin-tazobactam (ZOSYN) 3.375 GM/50ML IVPB       Note to Pharmacy: Ward, Christa K: cabinet override      06/25/22 1756 06/25/22 1803   06/25/22 1630  ceFAZolin (ANCEF) IVPB 2g/100 mL premix        2 g 200 mL/hr over 30 Minutes Intravenous  Once 06/25/22 1534 06/25/22 1640        Assessment/Plan POD 1, s/p ex lap with SBR and washout for feculent peritonitis for SB  perforation from SBO by Dr. Thermon Leyland  06/25/22 -feculent peritonitis found in OR with 2 SB perforations noted from obstructive process.  Unclear etiology. Mother had something similar and wife states sister is having some GI issues, ? Need for further work up to rule out [employer] or other etiology -abd open and ABthera VAC in place.  Plan to return to OR tomorrow for further washout and closure -discussed operative findings with wife at bedside.  She will consent for procedure tomorrow as well and this was fully discussed with her. -cont NGT for now.  Suspect may have prolonged post op ileus.  Insert picc and start TNA for nutritional support -cont zosyn -labs all reviewed and not unexpected changes -reorder labs for am  FEN - NPO/NGT/IVFs per primary VTE - lovenox ID - zosyn Foley - in place by urology last night due to inability to place on floor.  Leave foley until medically stable for this to be removed per their note.  Post op acute respiratory failure - appreciate CCM assistance with this patient.  Remain sedated on vent due to open abdomen SPCM - start TNA today    LOS: 0 days    Henreitta Cea , [facility] 06/26/2022, 8:50 AM Please see Amion for pager number during day hours 7:00am-4:30pm or 7:00am -11:30am on weekends

## 2022-06-26 NOTE — Progress Notes (Signed)
Peripherally Inserted Central Catheter Placement  The IV Nurse has discussed with the patient and/or persons authorized to consent for the patient, the purpose of this procedure and the potential benefits and risks involved with this procedure.  The benefits include less needle sticks, lab draws from the catheter, and the patient may be discharged home with the catheter. Risks include, but not limited to, infection, bleeding, blood clot (thrombus formation), and puncture of an artery; nerve damage and irregular heartbeat and possibility to perform a PICC exchange if needed/ordered by physician.  Alternatives to this procedure were also discussed.  Bard Power PICC patient education guide, fact sheet on infection prevention and patient information card has been provided to patient /or left at bedside.    PICC Placement Documentation  PICC Double Lumen 06/26/22 Right Brachial 38 cm 0 cm (Active)  Indication for Insertion or Continuance of Line Administration of hyperosmolar/irritating solutions (i.e. TPN, Vancomycin, etc.) 06/26/22 1610  Exposed Catheter (cm) 0 cm 06/26/22 1610  Site Assessment Clean, Dry, Intact 06/26/22 1610  Lumen #1 Status Flushed;Blood return noted;Saline locked 06/26/22 1610  Lumen #2 Status Flushed;Blood return noted;Saline locked 06/26/22 1610  Dressing Type Transparent 06/26/22 1610  Dressing Status Antimicrobial disc in place 06/26/22 [place] Not Applicable [id number]  Line Care Connections checked and tightened 06/26/22 1610  Line Adjustment (NICU/IV Team Only) No 06/26/22 1610  Dressing Intervention New dressing 06/26/22 1610  Dressing Change Due 07/03/22 06/26/22 1610       Scotty Court 06/26/2022, 4:12 PM

## 2022-06-27 ENCOUNTER — Inpatient Hospital Stay (HOSPITAL_COMMUNITY): Payer: 59 | Admitting: Certified Registered Nurse Anesthetist

## 2022-06-27 ENCOUNTER — Encounter (HOSPITAL_COMMUNITY)
Admission: EM | Disposition: A | Discharge status: Home Health | Payer: Self-pay | Source: Home / Self Care | Attending: Internal Medicine

## 2022-06-27 ENCOUNTER — Encounter (HOSPITAL_COMMUNITY): Payer: Self-pay | Admitting: Internal Medicine

## 2022-06-27 DIAGNOSIS — F17210 Nicotine dependence, cigarettes, uncomplicated: Secondary | ICD-10-CM | POA: Diagnosis not present

## 2022-06-27 DIAGNOSIS — Z978 Presence of other specified devices: Secondary | ICD-10-CM | POA: Diagnosis not present

## 2022-06-27 DIAGNOSIS — K659 Peritonitis, unspecified: Secondary | ICD-10-CM

## 2022-06-27 DIAGNOSIS — K631 Perforation of intestine (nontraumatic): Secondary | ICD-10-CM

## 2022-06-27 DIAGNOSIS — E43 Unspecified severe protein-calorie malnutrition: Secondary | ICD-10-CM | POA: Diagnosis not present

## 2022-06-27 DIAGNOSIS — K8020 Calculus of gallbladder without cholecystitis without obstruction: Secondary | ICD-10-CM | POA: Diagnosis not present

## 2022-06-27 DIAGNOSIS — D649 Anemia, unspecified: Secondary | ICD-10-CM

## 2022-06-27 HISTORY — PX: LAPAROTOMY: SHX154

## 2022-06-27 LAB — CBC
HCT: 26.1 % — ABNORMAL LOW (ref 39.0–52.0)
Hemoglobin: 9.2 g/dL — ABNORMAL LOW (ref 13.0–17.0)
MCH: 29.5 pg (ref 26.0–34.0)
MCHC: 35.2 g/dL (ref 30.0–36.0)
MCV: 83.7 fL (ref 80.0–100.0)
Platelets: 380 10*3/uL (ref 150–400)
RBC: 3.12 MIL/uL — ABNORMAL LOW (ref 4.22–5.81)
RDW: 14.3 % (ref 11.5–15.5)
WBC: 15.4 10*3/uL — ABNORMAL HIGH (ref 4.0–10.5)
nRBC: 0 % (ref 0.0–0.2)

## 2022-06-27 LAB — GLUCOSE, CAPILLARY
Glucose-Capillary: 134 mg/dL — ABNORMAL HIGH (ref 70–99)
Glucose-Capillary: 137 mg/dL — ABNORMAL HIGH (ref 70–99)
Glucose-Capillary: 142 mg/dL — ABNORMAL HIGH (ref 70–99)
Glucose-Capillary: 61 mg/dL — ABNORMAL LOW (ref 70–99)
Glucose-Capillary: 74 mg/dL (ref 70–99)
Glucose-Capillary: 77 mg/dL (ref 70–99)
Glucose-Capillary: 81 mg/dL (ref 70–99)
Glucose-Capillary: 86 mg/dL (ref 70–99)
Glucose-Capillary: 88 mg/dL (ref 70–99)
Glucose-Capillary: 97 mg/dL (ref 70–99)

## 2022-06-27 LAB — COMPREHENSIVE METABOLIC PANEL
ALT: 17 U/L (ref 0–44)
AST: 26 U/L (ref 15–41)
Albumin: 2.1 g/dL — ABNORMAL LOW (ref 3.5–5.0)
Alkaline Phosphatase: 43 U/L (ref 38–126)
Anion gap: 8 (ref 5–15)
BUN: 14 mg/dL (ref 6–20)
CO2: 24 mmol/L (ref 22–32)
Calcium: 8.1 mg/dL — ABNORMAL LOW (ref 8.9–10.3)
Chloride: 101 mmol/L (ref 98–111)
Creatinine, Ser: 0.57 mg/dL — ABNORMAL LOW (ref 0.61–1.24)
GFR, Estimated: >60 mL/min (ref >60)
Glucose, Bld: 110 mg/dL — ABNORMAL HIGH (ref 70–99)
Potassium: 3.8 mmol/L (ref 3.5–5.1)
Sodium: 133 mmol/L — ABNORMAL LOW (ref 135–145)
Total Bilirubin: 0.5 mg/dL (ref 0.3–1.2)
Total Protein: 4.9 g/dL — ABNORMAL LOW (ref 6.5–8.1)

## 2022-06-27 LAB — TRIGLYCERIDES: Triglycerides: 241 mg/dL — ABNORMAL HIGH (ref <150)

## 2022-06-27 LAB — PHOSPHORUS: Phosphorus: 2.2 mg/dL — ABNORMAL LOW (ref 2.5–4.6)

## 2022-06-27 LAB — MAGNESIUM: Magnesium: 2.1 mg/dL (ref 1.7–2.4)

## 2022-06-27 LAB — SURGICAL PATHOLOGY

## 2022-06-27 SURGERY — LAPAROTOMY, EXPLORATORY
Anesthesia: General | Site: Abdomen

## 2022-06-27 MED ORDER — FENTANYL CITRATE (PF) 250 MCG/5ML IJ SOLN
INTRAMUSCULAR | Status: AC
  Filled 2022-06-27: qty 5

## 2022-06-27 MED ORDER — LACTATED RINGERS IV SOLN: INTRAVENOUS | Status: DC | PRN

## 2022-06-27 MED ORDER — ONDANSETRON HCL 4 MG/2ML IJ SOLN
INTRAMUSCULAR | Status: DC | PRN
  Administered 2022-06-27: 4 mg via INTRAVENOUS

## 2022-06-27 MED ORDER — DEXTROSE 5 % IV SOLN: INTRAVENOUS | Status: AC

## 2022-06-27 MED ORDER — ONDANSETRON HCL 4 MG/2ML IJ SOLN
INTRAMUSCULAR | Status: AC
  Filled 2022-06-27: qty 2

## 2022-06-27 MED ORDER — ALBUTEROL SULFATE (2.5 MG/3ML) 0.083% IN NEBU: 2.5000 mg | INHALATION_SOLUTION | RESPIRATORY_TRACT | Status: DC | PRN

## 2022-06-27 MED ORDER — DEXAMETHASONE SODIUM PHOSPHATE 10 MG/ML IJ SOLN
INTRAMUSCULAR | Status: DC | PRN
  Administered 2022-06-27: 10 mg via INTRAVENOUS

## 2022-06-27 MED ORDER — PROPOFOL 10 MG/ML IV BOLUS
INTRAVENOUS | Status: AC
  Filled 2022-06-27: qty 20

## 2022-06-27 MED ORDER — POTASSIUM PHOSPHATES 15 MMOLE/5ML IV SOLN
15.0000 mmol | Freq: Once | INTRAVENOUS | Status: AC
  Administered 2022-06-27: 15 mmol via INTRAVENOUS
  Filled 2022-06-27: qty 5

## 2022-06-27 MED ORDER — DEXTROSE 50 % IV SOLN
INTRAVENOUS | Status: AC
  Administered 2022-06-27: 12.5 g via INTRAVENOUS
  Filled 2022-06-27: qty 50

## 2022-06-27 MED ORDER — MIDAZOLAM HCL 2 MG/2ML IJ SOLN
INTRAMUSCULAR | Status: AC
  Filled 2022-06-27: qty 2

## 2022-06-27 MED ORDER — INSULIN ASPART 100 UNIT/ML IJ SOLN: 0.0000 [IU] | INTRAMUSCULAR | Status: DC

## 2022-06-27 MED ORDER — 0.9 % SODIUM CHLORIDE (POUR BTL) OPTIME
TOPICAL | Status: DC | PRN
  Administered 2022-06-27: 4000 mL

## 2022-06-27 MED ORDER — DEXTROSE 50 % IV SOLN: 12.5000 g | INTRAVENOUS | Status: AC

## 2022-06-27 MED ORDER — FENTANYL CITRATE (PF) 100 MCG/2ML IJ SOLN
INTRAMUSCULAR | Status: DC | PRN
  Administered 2022-06-27: 50 ug via INTRAVENOUS

## 2022-06-27 MED ORDER — SUGAMMADEX SODIUM 200 MG/2ML IV SOLN
INTRAVENOUS | Status: DC | PRN
  Administered 2022-06-27: 100 mg via INTRAVENOUS

## 2022-06-27 MED ORDER — MIDAZOLAM HCL 5 MG/5ML IJ SOLN
INTRAMUSCULAR | Status: DC | PRN
  Administered 2022-06-27: 2 mg via INTRAVENOUS

## 2022-06-27 MED ORDER — TRAVASOL 10 % IV SOLN
INTRAVENOUS | Status: AC
  Filled 2022-06-27: qty 420

## 2022-06-27 MED ORDER — IPRATROPIUM-ALBUTEROL 0.5-2.5 (3) MG/3ML IN SOLN
3.0000 mL | Freq: Four times a day (QID) | RESPIRATORY_TRACT | Status: DC
  Administered 2022-06-27 – 2022-06-29 (×8): 3 mL via RESPIRATORY_TRACT
  Filled 2022-06-27 (×8): qty 3

## 2022-06-27 MED ORDER — DEXTROSE 5 % IV SOLN: INTRAVENOUS | Status: DC

## 2022-06-27 MED ORDER — ROCURONIUM BROMIDE 10 MG/ML (PF) SYRINGE
PREFILLED_SYRINGE | INTRAVENOUS | Status: DC | PRN
  Administered 2022-06-27: 50 mg via INTRAVENOUS

## 2022-06-27 SURGICAL SUPPLY — 36 items
APL PRP STRL LF DISP 70% ISPRP (MISCELLANEOUS)
BAG COUNTER SPONGE SURGICOUNT (BAG) IMPLANT
BAG SPNG CNTER NS LX DISP (BAG)
CHLORAPREP W/TINT 26 (MISCELLANEOUS) IMPLANT
COVER MAYO STAND STRL (DRAPES) ×1 IMPLANT
COVER SURGICAL LIGHT HANDLE (MISCELLANEOUS) ×1 IMPLANT
DRAIN CHANNEL 19F RND (DRAIN) IMPLANT
DRAPE LAPAROSCOPIC ABDOMINAL (DRAPES) ×1 IMPLANT
DRSG OPSITE POSTOP 4X10 (GAUZE/BANDAGES/DRESSINGS) IMPLANT
DRSG OPSITE POSTOP 4X6 (GAUZE/BANDAGES/DRESSINGS) IMPLANT
DRSG OPSITE POSTOP 4X8 (GAUZE/BANDAGES/DRESSINGS) IMPLANT
DRSG VAC GRANUFOAM LG (GAUZE/BANDAGES/DRESSINGS) IMPLANT
ELECT REM PT RETURN 15FT ADLT (MISCELLANEOUS) ×1 IMPLANT
EVACUATOR SILICONE 100CC (DRAIN) IMPLANT
GAUZE PAD ABD 7.5X8 STRL (GAUZE/BANDAGES/DRESSINGS) IMPLANT
GLOVE BIO SURGEON STRL SZ 6 (GLOVE) ×2 IMPLANT
GLOVE INDICATOR 6.5 STRL GRN (GLOVE) ×2 IMPLANT
GOWN STRL REUS W/ TWL XL LVL3 (GOWN DISPOSABLE) ×1 IMPLANT
GOWN STRL REUS W/TWL XL LVL3 (GOWN DISPOSABLE) ×1
KIT TURNOVER KIT A (KITS) IMPLANT
PACK GENERAL/GYN (CUSTOM PROCEDURE TRAY) ×1 IMPLANT
STAPLER VISISTAT 35W (STAPLE) IMPLANT
SUT ETHILON 3 0 PS 1 (SUTURE) IMPLANT
SUT MNCRL AB 4-0 PS2 18 (SUTURE) IMPLANT
SUT NOVA T20/GS 25 (SUTURE) IMPLANT
SUT SILK 2 0 (SUTURE) ×1
SUT SILK 2 0 SH CR/8 (SUTURE) ×1 IMPLANT
SUT SILK 2-0 18XBRD TIE 12 (SUTURE) ×1 IMPLANT
SUT SILK 3 0 (SUTURE) ×1
SUT SILK 3 0 SH CR/8 (SUTURE) ×1 IMPLANT
SUT SILK 3-0 18XBRD TIE 12 (SUTURE) ×1 IMPLANT
TAPE CLOTH SURG 6X10 WHT LF (GAUZE/BANDAGES/DRESSINGS) IMPLANT
TOWEL OR 17X26 10 PK STRL BLUE (TOWEL DISPOSABLE) IMPLANT
TOWEL OR NON WOVEN STRL DISP B (DISPOSABLE) ×1 IMPLANT
TRAY FOLEY MTR SLVR 14FR STAT (SET/KITS/TRAYS/PACK) ×1 IMPLANT
TRAY FOLEY MTR SLVR 16FR STAT (SET/KITS/TRAYS/PACK) ×1 IMPLANT

## 2022-06-27 NOTE — Op Note (Signed)
   Patient: Gabriel Dalton (1986/05/06, [id number])  Date of Surgery: 06/27/22  Preoperative Diagnosis: SMALL BOWEL PERFORATION   Postoperative Diagnosis: SMALL BOWEL PERFORATION   Surgical Procedure: EXPLORATORY LAPAROTOMY, [place] OUT,  ABDOMINAL CLOSURE: 49000 (CPT)   Operative Team Members:  Surgeon(s) and Role:    * Meklit Cotta, Nickola Major, MD - Primary   Anesthesiologist: Brennan Bailey, MD CRNA: Gean Maidens, CRNA   Anesthesia: General   Fluids:  Total I/O In: 655.2 [I.V.:654.8; IV Piggyback:0.4] Out: 325 [Urine:300; [id number]  Complications: None  Drains:  none   Specimen: None  Disposition:  ICU - intubated and hemodynamically stable.  Plan of Care:  Continue inpatient care    Indications for Procedure: Gabriel Dalton is a 37 y.o. male who returns to the OR today for removal of abthera temporary abdominal closure system, exploratory laparotomy, abdominal washout, and abdominal closure.  The procedure itself as well as its risks, benefits and alternatives were discussed with the patient's wife and mother.  The risks discussed included but were not limited to the risk of infection, bleeding, damage to nearby structures, and need for additional operations or procedures.  After a full discussion and all questions answered the patient granted consent to proceed.  Findings:  Thinner contaminated ascitic fluid throughout the abdomen.   Anastomosis and small bowel appear viable   Description of Procedure:   On the date stated above the patient was taken back to the operating room and placed in supine position.  Anesthesia was induced.  A timeout was completed.  Antibiotics were given a scheduled basis prior to the case start.  The ABThera wound VAC was removed from the abdomen.  I worked gently to free up the small intestine.  I was able to break up the early scar tissue between the intestines and run the intestine from the ligament of Treitz  to the terminal ileum.  The anastomosis was inspected and appeared viable.  The areas of concern distal to the anastomosis were inspected and still had an abnormal appearance, however these had not changed in the areas appeared viable.  I wonder if the appearance was from being in close contact with the abscess and walling off the abscess cavity in the pelvis, as opposed to ischemic changes.  The contaminated ascitic fluid in the abdomen was suctioned clean.  We used 3 L of saline irrigation to irrigate the abdomen until the fluid ran clear.  I was careful to break up any fluid collections above the liver, in the left upper quadrant, or down the pelvis.  We also inspected for any interloop abscesses.  Satisfied with the washout, we directed our attention to closure.  We used two #1 stratafix sutures to run the midline fascia closed.  Umbilicus was mobilized using electrocautery off of the fascia to ensure adequate fascial bites.  The wound was very small due to the patient's very small subcutaneous layer of tissue so a wound VAC was not applied instead we just applied wet-to-dry dressings.  The patient tolerated the procedure well and was transferred back to the ICU intubated, and can likely be extubated later today.  At the end of the case we reviewed the infection status of the case. Patient: Gabriel Dalton Emergency General Surgery Service Patient Case: Urgent Infection Present At Time Of Surgery (PATOS):  Contaminated ascites throughout the abdomen  Gabriel Raw, MD General, Bariatric, & Minimally Invasive Surgery [facility], [place]

## 2022-06-27 NOTE — Progress Notes (Signed)
Pt transported to OR via manual ventilation bag, with no complications

## 2022-06-27 NOTE — Anesthesia Preprocedure Evaluation (Addendum)
Anesthesia Evaluation  Patient identified by MRN, date of birth, ID band Patient awake    Reviewed: Allergy & Precautions, NPO status , Patient's Chart, lab work & pertinent test results  History of Anesthesia Complications Negative for: history of anesthetic complications  Airway Mallampati: Intubated       Dental   Pulmonary Current Smoker and Patient abstained from smoking. Intubated and sedated   Pulmonary exam normal        Cardiovascular negative cardio ROS Normal cardiovascular exam     Neuro/Psych negative neurological ROS  negative psych ROS   GI/Hepatic negative GI ROS, Neg liver ROS,,,SMALL BOWEL PERFORATION, open abdomen   Endo/Other  negative endocrine ROS    Renal/GU negative Renal ROS  negative genitourinary   Musculoskeletal negative musculoskeletal ROS (+)    Abdominal   Peds  Hematology  (+) Blood dyscrasia (Hgb 9.2), anemia   Anesthesia Other Findings Day of surgery medications reviewed with patient.  Reproductive/Obstetrics negative OB ROS                             Anesthesia Physical Anesthesia Plan  ASA: 4  Anesthesia Plan: General   Post-op Pain Management:    Induction: Inhalational  PONV Risk Score and Plan: 2 and Treatment may vary due to age or medical condition, Ondansetron, Dexamethasone and Midazolam  Airway Management Planned: Oral ETT  Additional Equipment:   Intra-op Plan:   Post-operative Plan: Post-operative intubation/ventilation  Informed Consent: I have reviewed the patients History and Physical, chart, labs and discussed the procedure including the risks, benefits and alternatives for the proposed anesthesia with the patient or authorized representative who has indicated his/her understanding and acceptance.     Consent reviewed with POA  Plan Discussed with: CRNA  Anesthesia Plan Comments: (Consent reviewed with patient's  wife, Prudence Davidson, via telephone. Daiva Huge, MD)        Anesthesia Quick Evaluation

## 2022-06-27 NOTE — Progress Notes (Signed)
  Transition of Care [facility]) Screening Note   Patient Details  Name: Gabriel Dalton Date of Birth: Jul 09, 1985   Transition of Care [facility]) CM/SW Contact:    Roseanne Kaufman, RN Phone Number: 06/27/2022, 4:01 PM    Transition of Care Department [facility]) has reviewed patient and no TOC needs have been identified at this time. We will continue to monitor patient advancement through interdisciplinary progression rounds. If new patient transition needs arise, please place a TOC consult.

## 2022-06-27 NOTE — Transfer of Care (Signed)
Immediate Anesthesia Transfer of Care Note  Patient: Gabriel Dalton  Procedure(s) Performed: EXPLORATORY LAPAROTOMY, [place] OUT,  ABDOMINAL CLOSURE (Abdomen)  Patient Location: ICU  Anesthesia Type:General  Level of Consciousness: Patient remains intubated per anesthesia plan  Airway & Oxygen Therapy: Patient remains intubated per anesthesia plan  Post-op Assessment: Report given to RN and Post -op Vital signs reviewed and stable  Post vital signs: Reviewed and stable  Last Vitals:  Vitals Value Taken Time  BP    Temp    Pulse    Resp    SpO2      Last Pain:  Vitals:   06/27/22 0813  TempSrc: Oral  PainSc:       Patients Stated Pain Goal: 2 ([id number])  Complications: No notable events documented.

## 2022-06-27 NOTE — Progress Notes (Addendum)
PHARMACY - TOTAL PARENTERAL NUTRITION CONSULT NOTE   Indication: malnutrition, bowel perforation, expected prolonged ileus  Patient Measurements: Height: 5' 8"$  (172.7 cm) Weight: 48.3 kg (106 lb 7.7 oz) IBW/kg (Calculated) : 68.4 TPN AdjBW (KG): 49.8 Body mass index is 16.19 kg/m.  Assessment: 29 yoM admitted with abdominal pain, weight loss.  Underwent laparoscopic cholecystectomy on 2/14 and was found with multiple bowel perforations and stool contamination of abdomen, converted to exploratory laparotomy, with small bowel resection and reanastomosis, and temporary abdominal closure. Remains intubated with open abdomen and plan for repeat washout later this week.  Per CCS, suspect he may have prolonged post op ileus.    Glucose / Insulin: No noted hx DM. Goal BG <150 -BG: 61-110. One hypoglycemic reading while TPN infusing requiring 12.5 g D50 Electrolytes: Na (133) slightly low, Phos (2.2) slightly low, K on lower end of normal. Other lytes WNL, including CorrCa (9.6) Renal: SCr < 1 Difficult urethral catheterization performed by urology on 2/14 Hepatic: WNL, low albumin, TG up to 241. Propofol off 2/15  Intake / Output; MIVF: mIVF:  NS at 50 mL/hr -UOP: 1980 mL, Drain output: 500 mL, NG output: 100 mL GI Imaging: GI Surgeries / Procedures:  2/14 OR: small bowel resection and reanastomosis, open abdomen 2/16 OR: washout and closure planned  Central access: Double lumen PICC placed 2/15 TPN start date: 2/15  Nutritional Goals: Goal TPN rate is 80 mL/hr to provide 96 g of protein and 2039 kcals per day.  RD Assessment: Estimated Needs Total Energy Estimated Needs: 1950-2100 kcals Total Protein Estimated Needs: 90-100 grams Total Fluid Estimated Needs: >/= 1.9L  Current Nutrition:  NPO TPN   Plan:  Now: Potassium phosphate 15 mmol IV once Change SSI from sensitive q8h to very sensitive q4h  At 1800:  Will slightly advance TPN to 35 mL/hr  Planning to advance slowly  given risk of refeeding syndrome This will provide 42 g protein, 892 kcal, 151 g dextrose   Electrolytes in TPN: Increase Na, K Na 100 mEq/L, K 40 mEq/L, Ca 53mq/L, Mg 557m/L, and Phos 1552m/L.  Cl:Ac 1:1 Thiamine 100m52m TPN x5 days (2/15 - 2/19) Add standard MVI and trace elements to TPN Continue very sensitive (0-6 units) SSI + CBG checks q4h mIVF management per provider: Changed from NS to D5. Will decrease rate to 40 mL/hr at 1800 Monitor TPN labs on Mon/Thurs. Recheck electrolytes tomorrow morning.  MaryLenis NoonarmD 06/27/22 7:35 AM

## 2022-06-27 NOTE — Anesthesia Postprocedure Evaluation (Signed)
Anesthesia Post Note  Patient: Gabriel Dalton  Procedure(s) Performed: EXPLORATORY LAPAROTOMY, [place] OUT,  ABDOMINAL CLOSURE (Abdomen)     Patient location during evaluation: ICU Anesthesia Type: General Level of consciousness: patient remains intubated per anesthesia plan and sedated Pain management: pain level controlled Vital Signs Assessment: post-procedure vital signs reviewed and stable Respiratory status: patient remains intubated per anesthesia plan, patient on ventilator - see flowsheet for VS and respiratory function stable Cardiovascular status: stable Anesthetic complications: no   No notable events documented.  Last Vitals:  Vitals:   06/27/22 0930 06/27/22 1107  BP: 122/83   Pulse:    Resp: (!) 8   Temp:    SpO2:  100%    Last Pain:  Vitals:   06/27/22 0813  TempSrc: Oral  PainSc:                  Marthenia Rolling

## 2022-06-27 NOTE — Progress Notes (Signed)
NAME:  Gabriel Dalton, MRN:  [id number], DOB:  Jun 04, 1985, LOS: 1 ADMISSION DATE:  06/24/2022, CONSULTATION DATE:  06/25/22 REFERRING MD:  Erlinda Hong - TRH , CHIEF COMPLAINT: endotracheally intubated post op    History of Present Illness:   37 yo M chronic abd pain, wt loss, gallstones (for which he saw CCS outpt 2/8 and chole was offered but not yet scheduled) presented to [facility] ED 2/13 with CC abd pain.   CCS was consulted 2/13 and pt was posted for lap chole 2/14. Op course c/b  identification of numerous small bowel perforations and was converted to open surgery, with evacuation of 1L bowel contents.   PCCM was called peri op as plan post op has evolved to remain intubated and transfer to ICU   Pertinent  Medical History  Chronic abd pain   Significant Hospital Events: Including procedures, antibiotic start and stop dates in addition to other pertinent events   2/13 admit. CCS consult for chole eval given known hx gallstones  2/14 intended lap chole --> ex lap with identification of small bowel perfs req resection. To ICU post op intubated. Added zosyn  2/15 remains intubated sedated w open abd  2/16 OR. Adding D5 with hypoglycemia on low rate TPN   Interim History / Subjective:  Overnight some hypoglycemia   Objective   Blood pressure 122/73, pulse 85, temperature (!) 97.4 F (36.3 C), temperature source Oral, resp. rate 15, height 5' 8"$  (1.727 m), weight 48.3 kg, SpO2 100 %.    Vent Mode: PRVC FiO2 (%):  [30 %] 30 % Set Rate:  [15 bmp] 15 bmp Vt Set:  [540 mL] 540 mL PEEP:  [5 cmH20] 5 cmH20 Plateau Pressure:  [14 cmH20-15 cmH20] 15 cmH20   Intake/Output Summary (Last 24 hours) at 06/27/2022 0959 Last data filed at 06/27/2022 0834 Gross per 24 hour  Intake 3680.79 ml  Output 2580 ml  Net 1100.79 ml   Filed Weights   06/25/22 2040 06/25/22 2053 06/26/22 1045  Weight: 48.8 kg 49.8 kg 48.3 kg    Examination: General: Chronically ill adult M intubated sedated NAD   HENT: NCAT pink mm anicteric sclera  Lungs: R sided wheeze. Mechanically ventilated  Cardiovascular: rr s1s2 cap refill brisk  Abdomen: thin, tender. Midline wound vac with good seal  Extremities: Symmetrically decr muscle mass. No acute deformity  Neuro: PERRL22m. Opens eyes to stimulation.   Resolved Hospital Problem list     Assessment & Plan:   Endotracheally intubated Small bowel perf due to SBO s/p resection, reanastomosis Sepsis due to feculent peritonitis Cholelithiasis Severe protein calorie malnutrition Hypoglycemia  Unintentional wt loss Chronic abd pain BPH Anemia Hyponatremia, mild Hypophosphatemia, mild  P -Return OR 2/16 with CCS -pending op course/post op surgical plan, possibly wean sedation after with hope to extubate post op -cont zosyn -TPN via PICC  -TPN rate w slow incr given risk of refeeding -adding d5, follow CBGs and adjust as needed   -maintain foley (inserted by uro )     Best Practice (right click and "Reselect all SmartList Selections" daily)   Diet/type: NPO DVT prophylaxis: SCD GI prophylaxis: H2B Lines: N/A Foley:  Yes, and it is still needed Code Status:  full code Last date of multidisciplinary goals of care discussion [family updated at bedside 2/16]   Labs   CBC: Recent Labs  Lab 06/24/22 1400 06/25/22 2016 06/26/22 0538 06/27/22 0431  WBC 7.9 7.3 16.1* 15.4*  NEUTROABS 5.2  --  13.9*  --  HGB 11.7* 11.1* 11.2* 9.2*  HCT 32.8* 32.8* 32.6* 26.1*  MCV 83.0 86.3 84.9 83.7  PLT 590* 537* 451* [id number]    Basic Metabolic Panel: Recent Labs  Lab 06/24/22 1400 06/26/22 0538 06/27/22 0431  NA 129* 131* 133*  K 4.0 4.5 3.8  CL 90* 99 101  CO2 26 22 24  $ GLUCOSE 113* 79 110*  BUN 21* 17 14  CREATININE 0.81 0.76 0.57*  CALCIUM 9.3 8.5* 8.1*  MG  --  2.0 2.1  PHOS  --  2.9 2.2*   GFR: Estimated Creatinine Clearance: 88 mL/min (A) (by C-G formula based on SCr of 0.57 mg/dL (L)). Recent Labs  Lab 06/24/22 1400  06/25/22 2016 06/26/22 0538 06/27/22 0431  WBC 7.9 7.3 16.1* 15.4*    Liver Function Tests: Recent Labs  Lab 06/24/22 1400 06/26/22 0538 06/27/22 0431  AST 19 18 26  $ ALT 22 14 17  $ ALKPHOS 82 50 43  BILITOT 0.5 1.1 0.5  PROT 7.0 5.8* 4.9*  ALBUMIN 3.2* 2.7* 2.1*   Recent Labs  Lab 06/24/22 1400  LIPASE 30   No results for input(s): "AMMONIA" in the last 168 hours.  ABG    Component Value Date/Time   PHART 7.41 06/26/2022 0823   PCO2ART 38 06/26/2022 0823   PO2ART 303 (H) 06/26/2022 0823   HCO3 24.1 06/26/2022 0823   ACIDBASEDEF 0.3 06/26/2022 0823   O2SAT 99.7 06/26/2022 0823     Coagulation Profile: No results for input(s): "INR", "PROTIME" in the last 168 hours.  Cardiac Enzymes: No results for input(s): "CKTOTAL", "CKMB", "CKMBINDEX", "TROPONINI" in the last 168 hours.  HbA1C: Hgb A1c MFr Bld  Date/Time Value Ref Range Status  06/24/2022 09:42 PM 5.8 (H) 4.8 - 5.6 % Final    Comment:    (NOTE) Pre diabetes:          5.7%-6.4%  Diabetes:              >6.4%  Glycemic control for   <7.0% adults with diabetes     CBG: Recent Labs  Lab 06/26/22 1924 06/26/22 2341 06/27/22 0318 06/27/22 0349 06/27/22 0756  GLUCAP 77 81 61* 88 74    CRITICAL CARE Performed by: Cristal Generous   Total critical care time: 37 minutes  Critical care time was exclusive of separately billable procedures and treating other patients. Critical care was necessary to treat or prevent imminent or life-threatening deterioration.  Critical care was time spent personally by me on the following activities: development of treatment plan with patient and/or surrogate as well as nursing, discussions with consultants, evaluation of patient's response to treatment, examination of patient, obtaining history from patient or surrogate, ordering and performing treatments and interventions, ordering and review of laboratory studies, ordering and review of radiographic studies, pulse  oximetry and re-evaluation of patient's condition.  Eliseo Gum MSN, AGACNP-BC [place] for pager  06/27/2022, 9:59 AM

## 2022-06-28 ENCOUNTER — Inpatient Hospital Stay (HOSPITAL_COMMUNITY): Payer: 59

## 2022-06-28 DIAGNOSIS — J9601 Acute respiratory failure with hypoxia: Secondary | ICD-10-CM | POA: Diagnosis not present

## 2022-06-28 DIAGNOSIS — K659 Peritonitis, unspecified: Secondary | ICD-10-CM | POA: Diagnosis not present

## 2022-06-28 DIAGNOSIS — K631 Perforation of intestine (nontraumatic): Secondary | ICD-10-CM | POA: Diagnosis not present

## 2022-06-28 LAB — PHOSPHORUS: Phosphorus: 2.9 mg/dL (ref 2.5–4.6)

## 2022-06-28 LAB — BASIC METABOLIC PANEL
Anion gap: 9 (ref 5–15)
BUN: 13 mg/dL (ref 6–20)
CO2: 27 mmol/L (ref 22–32)
Calcium: 8.2 mg/dL — ABNORMAL LOW (ref 8.9–10.3)
Chloride: 96 mmol/L — ABNORMAL LOW (ref 98–111)
Creatinine, Ser: 0.57 mg/dL — ABNORMAL LOW (ref 0.61–1.24)
GFR, Estimated: >60 mL/min (ref >60)
Glucose, Bld: 134 mg/dL — ABNORMAL HIGH (ref 70–99)
Potassium: 4 mmol/L (ref 3.5–5.1)
Sodium: 132 mmol/L — ABNORMAL LOW (ref 135–145)

## 2022-06-28 LAB — CBC
HCT: 25.5 % — ABNORMAL LOW (ref 39.0–52.0)
Hemoglobin: 8.7 g/dL — ABNORMAL LOW (ref 13.0–17.0)
MCH: 29.4 pg (ref 26.0–34.0)
MCHC: 34.1 g/dL (ref 30.0–36.0)
MCV: 86.1 fL (ref 80.0–100.0)
Platelets: 398 10*3/uL (ref 150–400)
RBC: 2.96 MIL/uL — ABNORMAL LOW (ref 4.22–5.81)
RDW: 15.2 % (ref 11.5–15.5)
WBC: 20.7 10*3/uL — ABNORMAL HIGH (ref 4.0–10.5)
nRBC: 0.1 % (ref 0.0–0.2)

## 2022-06-28 LAB — GLUCOSE, CAPILLARY
Glucose-Capillary: 135 mg/dL — ABNORMAL HIGH (ref 70–99)
Glucose-Capillary: 28 mg/dL — CL (ref 70–99)
Glucose-Capillary: 126 mg/dL — ABNORMAL HIGH (ref 70–99)
Glucose-Capillary: 137 mg/dL — ABNORMAL HIGH (ref 70–99)
Glucose-Capillary: 111 mg/dL — ABNORMAL HIGH (ref 70–99)
Glucose-Capillary: 112 mg/dL — ABNORMAL HIGH (ref 70–99)

## 2022-06-28 LAB — MAGNESIUM: Magnesium: 2.1 mg/dL (ref 1.7–2.4)

## 2022-06-28 MED ORDER — MORPHINE SULFATE (PF) 2 MG/ML IV SOLN
2.0000 mg | INTRAVENOUS | Status: DC | PRN
  Administered 2022-06-28: 2 mg via INTRAVENOUS
  Administered 2022-06-28: 4 mg via INTRAVENOUS
  Administered 2022-06-28: 3 mg via INTRAVENOUS
  Administered 2022-06-28: 2 mg via INTRAVENOUS
  Filled 2022-06-28 (×2): qty 2
  Filled 2022-06-28 (×2): qty 1

## 2022-06-28 MED ORDER — ONDANSETRON HCL 4 MG/2ML IJ SOLN
4.0000 mg | Freq: Four times a day (QID) | INTRAMUSCULAR | Status: DC | PRN
  Administered 2022-06-28 (×2): 4 mg via INTRAVENOUS
  Filled 2022-06-28 (×3): qty 2

## 2022-06-28 MED ORDER — TRAVASOL 10 % IV SOLN
INTRAVENOUS | Status: AC
  Filled 2022-06-28: qty 660

## 2022-06-28 MED ORDER — ACETAMINOPHEN 10 MG/ML IV SOLN
1000.0000 mg | Freq: Four times a day (QID) | INTRAVENOUS | Status: AC
  Administered 2022-06-28 – 2022-06-29 (×4): 1000 mg via INTRAVENOUS
  Filled 2022-06-28 (×4): qty 100

## 2022-06-28 MED ORDER — FENTANYL CITRATE PF 50 MCG/ML IJ SOSY
25.0000 ug | PREFILLED_SYRINGE | INTRAMUSCULAR | Status: DC | PRN
  Administered 2022-06-29 (×2): 25 ug via INTRAVENOUS
  Filled 2022-06-28 (×2): qty 1

## 2022-06-28 MED ORDER — ORAL CARE MOUTH RINSE: 15.0000 mL | OROMUCOSAL | Status: DC | PRN

## 2022-06-28 MED ORDER — PHENOL 1.4 % MT LIQD
1.0000 | OROMUCOSAL | Status: DC | PRN
  Administered 2022-06-28: 1 via OROMUCOSAL
  Filled 2022-06-28: qty 177

## 2022-06-28 NOTE — Progress Notes (Signed)
PHARMACY - TOTAL PARENTERAL NUTRITION CONSULT NOTE   Indication: malnutrition, bowel perforation, expected prolonged ileus  Patient Measurements: Height: 5' 8"$  (172.7 cm) Weight: 48.3 kg (106 lb 7.7 oz) IBW/kg (Calculated) : 68.4 TPN AdjBW (KG): 49.8 Body mass index is 16.19 kg/m.  Assessment: 56 yoM admitted with abdominal pain, weight loss.  Underwent laparoscopic cholecystectomy on 2/14 and was found with multiple bowel perforations and stool contamination of abdomen, converted to exploratory laparotomy, with small bowel resection and reanastomosis, and temporary abdominal closure. Remains intubated with open abdomen and plan for repeat washout later this week.  Per [facility]CS, suspect he may have prolonged post op ileus.    Glucose / Insulin: No noted hx DM. Goal BG <150 - Hypoglycemia x1 on 2/17 ~4am with CBG 28 (despite TPN and D5 infusions).  Other CBGs:  74-142 - Dexamethasone 516m IV intra-op on 2/16 Electrolytes: Na remains slightly low (132, on D5 infusion), Cl low (96).  Others WNL including K, Mag, Phos (Kphos 15 mmol on 2/16), CorrCa (9.7) Renal: SCr < 1 Difficult urethral catheterization performed by urology on 2/14 Hepatic: WNL, low albumin, TG up to 241. Propofol off 2/15  Intake / Output; MIVF: mIVF:  D5 at 40 ml/hr.  -UOP: 1175 mL, NG output: 200 mL GI Imaging: GI Surgeries / Procedures:  2/14 OR: small bowel resection and reanastomosis, open abdomen 2/16 OR: exp lap, washout and abdominal closure   Central access: Double lumen PICC placed 2/15 TPN start date: 2/15  Nutritional Goals: Goal TPN rate is 80 mL/hr to provide 96 g of protein and 2039 kcals per day.  RD Assessment: Estimated Needs Total Energy Estimated Needs: 1950-2100 kcals Total Protein Estimated Needs: 90-100 grams Total Fluid Estimated Needs: >/= 1.9L  Current Nutrition:  NPO TPN   Plan:  At 1800:  Advance TPN to 55 mL/hr  Planning to advance slowly given high risk of refeeding syndrome This  will provide 66 g protein, 1402 kcal, 238 g dextrose   Electrolytes in TPN: Increase Na, Phos, Cl Na 125 mEq/L, K 40 mEq/L, Ca 578m/L, Mg 16m70mL, and Phos 20 mmol/L.  Cl:Ac 2:1 Thiamine 100m21m TPN x5 days (2/15 - 2/19) Add standard MVI and trace elements to TPN Continue very sensitive (0-6 units) SSI + CBG checks q4h mIVF management per provider, continue D5 at 40 mL/hr Monitor TPN labs on Mon/Thurs. Recheck electrolytes tomorrow morning.  ChriGretta ArabrmD, BCPS [facility] main pharmacy [phone]/2024 9:30 AM

## 2022-06-28 NOTE — Procedures (Signed)
Extubation Procedure Note  Patient Details:   Name: Gabriel Dalton DOB: 02-Apr-1986 MRN: [id number]   Airway Documentation:    Vent end date: 06/28/22 Vent end time: 0930   Evaluation  O2 sats: stable throughout Complications: No apparent complications Patient did tolerate procedure well. Bilateral Breath Sounds: Clear   Yes  Positive cuff leak noted. Patient placed on [place] 4L with humidity, no stridor noted. Patient able to reach 1000 mL using the incentive spirometer.  Bayard Beaver 06/28/2022, 9:38 AM

## 2022-06-28 NOTE — Progress Notes (Signed)
NAME:  Gabriel Dalton, MRN:  [id number], DOB:  [birth year], LOS: 2 ADMISSION DATE:  06/24/2022, CONSULTATION DATE:  06/25/22 REFERRING MD:  Erlinda Hong - TRH , CHIEF COMPLAINT: endotracheally intubated post op    History of Present Illness:   37 yo M chronic abd pain, wt loss, gallstones (for which he saw CCS outpt 2/8 and chole was offered but not yet scheduled) presented to [facility] ED 2/13 with CC abd pain.   CCS was consulted 2/13 and pt was posted for lap chole 2/14. Op course c/b  identification of numerous small bowel perforations and was converted to open surgery, with evacuation of 1L bowel contents.   PCCM was called ,remained intubated and transfer to ICU   Pertinent  Medical History  Chronic abd pain   Significant Hospital Events: Including procedures, antibiotic start and stop dates in addition to other pertinent events   2/13 admit. CCS consult for chole eval given known hx gallstones  2/14 intended lap chole --> ex lap with identification of small bowel perfs req resection + feculent peritonitis. To ICU post op intubated. Added zosyn  2/15 remains intubated sedated w open abd  2/16 OR for washout and abdominal closure, adding D5 with hypoglycemia on low rate TPN   Interim History / Subjective:  Remains critically ill, intubated Afebrile. Complains of pain in the abdomen, on 100 mics of fentanyl Good urine output 1.1 L   Objective   Blood pressure (!) 144/88, pulse 99, temperature 98.2 F (36.8 C), temperature source Axillary, resp. rate 15, height 5' 8"$  (1.727 m), weight 48.3 kg, SpO2 100 %.    Vent Mode: PRVC FiO2 (%):  [30 %] 30 % Set Rate:  [15 bmp] 15 bmp Vt Set:  [540 mL] 540 mL PEEP:  [5 cmH20] 5 cmH20 Plateau Pressure:  [14 cmH20-17 cmH20] 17 cmH20   Intake/Output Summary (Last 24 hours) at 06/28/2022 0837 Last data filed at 06/28/2022 0725 Gross per 24 hour  Intake 2631.51 ml  Output 1400 ml  Net 1231.51 ml    Filed Weights   06/25/22 2040 06/25/22  2053 06/26/22 1045  Weight: 48.8 kg 49.8 kg 48.3 kg    Examination: General: Chronically ill thin appearing adult M intubated HENT: NCAT pink mm anicteric sclera  Lungs: Bilateral ventilated breath sounds, no accessory muscle use Cardiovascular: rr s1s2 cap refill brisk  Abdomen: thin, tender. Midline dressing Extremities: Symmetrically decr muscle mass. No acute deformity  Neuro: Eyes open, follows commands, moves all extremities  Chest x-ray 2/14, no new infiltrates Labs show stable hyponatremia, good renal function, increase leukocytosis, stable anemia  Resolved Hospital Problem list     Assessment & Plan:   Acute respiratory failure -postop mechanical ventilation  -Wean sedation today, plan for spontaneous breathing trials and goal extubation   Small bowel perf due to SBO s/p resection, reanastomosis Sepsis due to feculent peritonitis Cholelithiasis  -Postop care per surgery, continue NG tube -Continue Zosyn  Severe protein calorie malnutrition Hypoglycemia  Unintentional wt loss Chronic abd pain   -TNA depending on bowel recovery -D5 added for hypoglycemia -May need further workup for Ehlers-Danlos or other etiology in the future   BPH  -Foley placed by urology since nursing unable to place -Leave in until medically stable   Anemia, of critical illness -Transfuse only for hemoglobin less than 7  Hyponatremia, mild Hypophosphatemia, mild  P -Replete  as needed and follow    Best Practice (right click and "Reselect all SmartList Selections" daily)  Diet/type: NPO DVT prophylaxis: SCD GI prophylaxis: H2B Lines: N/A Foley:  Yes, and it is still needed Code Status:  full code Last date of multidisciplinary goals of care discussion [wife updated at bedside 2/17]   Labs   CBC: Recent Labs  Lab 06/24/22 1400 06/25/22 2016 06/26/22 0538 06/27/22 0431 06/28/22 0323  WBC 7.9 7.3 16.1* 15.4* 20.7*  NEUTROABS 5.2  --  13.9*  --   --   HGB  11.7* 11.1* 11.2* 9.2* 8.7*  HCT 32.8* 32.8* 32.6* 26.1* 25.5*  MCV 83.0 86.3 84.9 83.7 86.1  PLT 590* 537* 451* 380 398     Basic Metabolic Panel: Recent Labs  Lab 06/24/22 1400 06/26/22 0538 06/27/22 0431 06/28/22 0544  NA 129* 131* 133* 132*  K 4.0 4.5 3.8 4.0  CL 90* 99 101 96*  CO2 26 22 24 27  $ GLUCOSE 113* 79 110* 134*  BUN 21* 17 14 13  $ CREATININE 0.81 0.76 0.57* 0.57*  CALCIUM 9.3 8.5* 8.1* 8.2*  MG  --  2.0 2.1  --   PHOS  --  2.9 2.2*  --     GFR: Estimated Creatinine Clearance: 88 mL/min (A) (by C-G formula based on SCr of 0.57 mg/dL (L)). Recent Labs  Lab 06/25/22 2016 06/26/22 0538 06/27/22 0431 06/28/22 0323  WBC 7.3 16.1* 15.4* 20.7*     Liver Function Tests: Recent Labs  Lab 06/24/22 1400 06/26/22 0538 06/27/22 0431  AST 19 18 26  $ ALT 22 14 17  $ ALKPHOS 82 50 43  BILITOT 0.5 1.1 0.5  PROT 7.0 5.8* 4.9*  ALBUMIN 3.2* 2.7* 2.1*    Recent Labs  Lab 06/24/22 1400  LIPASE 30    No results for input(s): "AMMONIA" in the last 168 hours.  ABG    Component Value Date/Time   PHART 7.41 06/26/2022 0823   PCO2ART 38 06/26/2022 0823   PO2ART 303 (H) 06/26/2022 0823   HCO3 24.1 06/26/2022 0823   ACIDBASEDEF 0.3 06/26/2022 0823   O2SAT 99.7 06/26/2022 0823     Coagulation Profile: No results for input(s): "INR", "PROTIME" in the last 168 hours.  Cardiac Enzymes: No results for input(s): "CKTOTAL", "CKMB", "CKMBINDEX", "TROPONINI" in the last 168 hours.  HbA1C: Hgb A1c MFr Bld  Date/Time Value Ref Range Status  06/24/2022 09:42 PM 5.8 (H) 4.8 - 5.6 % Final    Comment:    (NOTE) Pre diabetes:          5.7%-6.4%  Diabetes:              >6.4%  Glycemic control for   <7.0% adults with diabetes     CBG: Recent Labs  Lab 06/27/22 1949 06/27/22 2337 06/28/22 0349 06/28/22 0352 06/28/22 0810  GLUCAP 137* 142* 28* 135* 126*     CRITICAL CARE Performed by: Leanna Sato. Aviella Disbrow   Total critical care time: 34 minutes  Critical  care time was exclusive of separately billable procedures and treating other patients. Critical care was necessary to treat or prevent imminent or life-threatening deterioration.  Critical care was time spent personally by me on the following activities: development of treatment plan with patient and/or surrogate as well as nursing, discussions with consultants, evaluation of patient's response to treatment, examination of patient, obtaining history from patient or surrogate, ordering and performing treatments and interventions, ordering and review of laboratory studies, ordering and review of radiographic studies, pulse oximetry and re-evaluation of patient's condition.  Kara Mead MD. Shade Flood. [facility] Pager :  [phone]  If no response to pager , please call [phone] until 7 pm After 7:00 pm call Elink  [phone]     06/28/2022, 8:37 AM

## 2022-06-28 NOTE — Progress Notes (Signed)
[facility] Progress Note Patient Name: Gabriel Dalton DOB: [birth year] MRN: [id number]   Date of Service  06/28/2022  HPI/Events of Note  RN noted patient is getting PRN morphine but pain is not well controlled on that. Also has standing IV tylenol. Was on the vent earlier and was doing very well with fentanyl so RN asked if we could use that instead. 4 mg iV morphine given around 945 pm  eICU Interventions  Changed order to IV fentanyl to start at 1 am  D/w RN      Intervention Category Intermediate Interventions: Pain - evaluation and management  Margaretmary Lombard 06/28/2022, 11:06 PM  Addendum at 12:15 am - Nausea, was given Zofran but still has nausea. NG in good position and only with small drainage via LIS and no changes otherwise. Will try compazine.

## 2022-06-28 NOTE — Progress Notes (Signed)
1 Day Post-Op   Subjective/Chief Complaint: Intubated, alert   Objective: Vital signs in last 24 hours: Temp:  [97 F (36.1 C)-98.2 F (36.8 C)] 98.2 F (36.8 C) (02/17 0400) Pulse Rate:  [97-102] 97 (02/17 0246) Resp:  [8-18] 15 (02/17 0716) BP: (115-172)/(72-108) 129/84 (02/17 0600) SpO2:  [100 %] 100 % (02/17 0716) FiO2 (%):  [30 %] 30 % (02/17 0716) Last BM Date :  (PTA)  Intake/Output from previous day: 02/16 0701 - 02/17 0700 In: 2673.7 [I.V.:2171.9; IV Piggyback:501.9] Out: 1400 [Urine:1175; Emesis/NG output:200; Blood:25] Intake/Output this shift: Total I/O In: 112.9 [I.V.:112.9] Out: -   Ab dressing dry (just changed), approp tender  Lab Results:  Recent Labs    06/26/22 0538 06/27/22 0431  WBC 16.1* 15.4*  HGB 11.2* 9.2*  HCT 32.6* 26.1*  PLT 451* 380   BMET Recent Labs    06/27/22 0431 06/28/22 0544  NA 133* 132*  K 3.8 4.0  CL 101 96*  CO2 24 27  GLUCOSE 110* 134*  BUN 14 13  CREATININE 0.57* 0.57*  CALCIUM 8.1* 8.2*   PT/INR No results for input(s): "LABPROT", "INR" in the last 72 hours. ABG Recent Labs    06/25/22 1951 06/26/22 0823  PHART 7.25* 7.41  HCO3 19.3* 24.1    Studies/Results: [place] EKG SITE RITE  Result Date: 06/26/2022 If Site Rite image not attached, placement could not be confirmed due to current cardiac rhythm.   Anti-infectives: Anti-infectives (From admission, onward)    Start     Dose/Rate Route Frequency Ordered Stop   06/26/22 0000  piperacillin-tazobactam (ZOSYN) IVPB 3.375 g        3.375 g 12.5 mL/hr over 240 Minutes Intravenous Every 8 hours 06/25/22 1832     06/25/22 1815  piperacillin-tazobactam (ZOSYN) IVPB 3.375 g        3.375 g 100 mL/hr over 30 Minutes Intravenous  Once 06/25/22 1802 06/25/22 1830   06/25/22 1756  piperacillin-tazobactam (ZOSYN) 3.375 GM/50ML IVPB       Note to Pharmacy: Ward, Christa K: cabinet override      06/25/22 1756 06/27/22 0333   06/25/22 1630  ceFAZolin (ANCEF) IVPB  2g/100 mL premix        2 g 200 mL/hr over 30 Minutes Intravenous  Once 06/25/22 1534 06/25/22 1640       Assessment/Plan: POD 3/1 s/p ex lap with SBR and washout for feculent peritonitis for SB perforation from SBO by Dr. Thermon Leyland  06/25/22 -feculent peritonitis found in OR with 2 SB perforations noted from obstructive process.  Unclear etiology. Mother had something similar and wife states sister is having some GI issues, ? Need for further work up to rule out [employer] or other etiology -cont NGT for now. TPN.  -cont zosyn  FEN - NPO/NGT/IVFs per primary VTE - lovenox ID - zosyn Foley - in place by urology last night due to inability to place on floor.  Leave foley until medically stable for this to be removed per their note.   Post op acute respiratory failure - appreciate CCM assistance with this patient.      Rolm Bookbinder 06/28/2022

## 2022-06-29 ENCOUNTER — Encounter (HOSPITAL_COMMUNITY): Payer: Self-pay | Admitting: Surgery

## 2022-06-29 DIAGNOSIS — K631 Perforation of intestine (nontraumatic): Secondary | ICD-10-CM | POA: Diagnosis not present

## 2022-06-29 DIAGNOSIS — K659 Peritonitis, unspecified: Secondary | ICD-10-CM | POA: Diagnosis not present

## 2022-06-29 LAB — BASIC METABOLIC PANEL
Anion gap: 8 (ref 5–15)
BUN: 9 mg/dL (ref 6–20)
CO2: 28 mmol/L (ref 22–32)
Calcium: 8.2 mg/dL — ABNORMAL LOW (ref 8.9–10.3)
Chloride: 96 mmol/L — ABNORMAL LOW (ref 98–111)
Creatinine, Ser: 0.55 mg/dL — ABNORMAL LOW (ref 0.61–1.24)
GFR, Estimated: >60 mL/min (ref >60)
Glucose, Bld: 124 mg/dL — ABNORMAL HIGH (ref 70–99)
Potassium: 3.4 mmol/L — ABNORMAL LOW (ref 3.5–5.1)
Sodium: 132 mmol/L — ABNORMAL LOW (ref 135–145)

## 2022-06-29 LAB — CBC
HCT: 26 % — ABNORMAL LOW (ref 39.0–52.0)
Hemoglobin: 9.2 g/dL — ABNORMAL LOW (ref 13.0–17.0)
MCH: 29.1 pg (ref 26.0–34.0)
MCHC: 35.4 g/dL (ref 30.0–36.0)
MCV: 82.3 fL (ref 80.0–100.0)
Platelets: 373 10*3/uL (ref 150–400)
RBC: 3.16 MIL/uL — ABNORMAL LOW (ref 4.22–5.81)
RDW: 14.5 % (ref 11.5–15.5)
WBC: 14 10*3/uL — ABNORMAL HIGH (ref 4.0–10.5)
nRBC: 0.2 % (ref 0.0–0.2)

## 2022-06-29 LAB — GLUCOSE, CAPILLARY
Glucose-Capillary: 107 mg/dL — ABNORMAL HIGH (ref 70–99)
Glucose-Capillary: 113 mg/dL — ABNORMAL HIGH (ref 70–99)
Glucose-Capillary: 118 mg/dL — ABNORMAL HIGH (ref 70–99)
Glucose-Capillary: 124 mg/dL — ABNORMAL HIGH (ref 70–99)
Glucose-Capillary: 126 mg/dL — ABNORMAL HIGH (ref 70–99)

## 2022-06-29 LAB — MAGNESIUM: Magnesium: 1.6 mg/dL — ABNORMAL LOW (ref 1.7–2.4)

## 2022-06-29 LAB — PHOSPHORUS: Phosphorus: 1.8 mg/dL — ABNORMAL LOW (ref 2.5–4.6)

## 2022-06-29 MED ORDER — METHOCARBAMOL 1000 MG/10ML IJ SOLN
500.0000 mg | Freq: Three times a day (TID) | INTRAVENOUS | Status: DC
  Administered 2022-06-29 – 2022-06-30 (×4): 500 mg via INTRAVENOUS
  Filled 2022-06-29: qty 5
  Filled 2022-06-29 (×4): qty 500

## 2022-06-29 MED ORDER — PANTOPRAZOLE SODIUM 40 MG IV SOLR
40.0000 mg | INTRAVENOUS | Status: DC
  Administered 2022-06-29 – 2022-07-10 (×12): 40 mg via INTRAVENOUS
  Filled 2022-06-29 (×12): qty 10

## 2022-06-29 MED ORDER — MAGNESIUM SULFATE 2 GM/50ML IV SOLN
2.0000 g | Freq: Once | INTRAVENOUS | Status: AC
  Administered 2022-06-29: 2 g via INTRAVENOUS
  Filled 2022-06-29: qty 50

## 2022-06-29 MED ORDER — POTASSIUM PHOSPHATES 15 MMOLE/5ML IV SOLN
30.0000 mmol | Freq: Once | INTRAVENOUS | Status: AC
  Administered 2022-06-29: 30 mmol via INTRAVENOUS
  Filled 2022-06-29: qty 10

## 2022-06-29 MED ORDER — TRAVASOL 10 % IV SOLN
INTRAVENOUS | Status: AC
  Filled 2022-06-29: qty 660

## 2022-06-29 MED ORDER — PROCHLORPERAZINE EDISYLATE 10 MG/2ML IJ SOLN
5.0000 mg | Freq: Once | INTRAMUSCULAR | Status: AC
  Administered 2022-06-29: 5 mg via INTRAVENOUS
  Filled 2022-06-29: qty 2

## 2022-06-29 MED ORDER — ACETAMINOPHEN 10 MG/ML IV SOLN
1000.0000 mg | Freq: Four times a day (QID) | INTRAVENOUS | Status: AC
  Administered 2022-06-29 – 2022-06-30 (×4): 1000 mg via INTRAVENOUS
  Filled 2022-06-29 (×4): qty 100

## 2022-06-29 MED ORDER — HYDROMORPHONE HCL 1 MG/ML IJ SOLN
0.5000 mg | INTRAMUSCULAR | Status: DC | PRN
  Administered 2022-06-29 – 2022-06-30 (×8): 2 mg via INTRAVENOUS
  Filled 2022-06-29: qty 1
  Filled 2022-06-29 (×6): qty 2
  Filled 2022-06-29: qty 1
  Filled 2022-06-29: qty 2

## 2022-06-29 NOTE — Evaluation (Signed)
Physical Therapy Evaluation Patient Details Name: Gabriel Dalton MRN: [id number] DOB: Jul 19, 1985 Today's Date: 06/29/2022  History of Present Illness  37 year old admitted with abd pain, gallstones (for which he saw CCS outpt 2/8 and chole was offered but not yet scheduled)  admitted with acute cholecystitis.   S/p ex-lap 2/14 and found with multiple bowel perforations and stool. Remained with open abdomen. PCCM consulted for vent management post-op. pt returned to OR for closure on 2/16. extubated 2/17  Clinical Impression  Pt admitted with above diagnosis.  Pt is independent at his baseline, currently limited by pain, weakness. Overall min assist for bed mobility, transfers and short distance amb in room. Fatigues easily but reports decr pain after activity. Will continue efforts to mobilize, may need HHPT at d/c VSS during PT eval  Pt currently with functional limitations due to the deficits listed below (see PT Problem List). Pt will benefit from skilled PT to increase their independence and safety with mobility to allow discharge to the venue listed below.          Recommendations for follow up therapy are one component of a multi-disciplinary discharge planning process, led by the attending physician.  Recommendations may be updated based on patient status, additional functional criteria and insurance authorization.  Follow Up Recommendations Home health PT      Assistance Recommended at Discharge Intermittent Supervision/Assistance  Patient can return home with the following  Help with stairs or ramp for entrance;Assistance with cooking/housework    Equipment Recommendations Other (comment) (TBD)  Recommendations for Other Services       Functional Status Assessment Patient has had a recent decline in their functional status and demonstrates the ability to make significant improvements in function in a reasonable and predictable amount of time.     Precautions /  Restrictions Precautions Precautions: Other (comment) Precaution Comments: TNA, multiple lines, abd incision Restrictions Weight Bearing Restrictions: No      Mobility  Bed Mobility Overal bed mobility: Needs Assistance Bed Mobility: Rolling, Sidelying to Sit Rolling: Min assist Sidelying to sit: Mod assist, Min assist, +2 for safety/equipment       General bed mobility comments: assist to elevate trunk    Transfers Overall transfer level: Needs assistance Equipment used: Rolling walker (2 wheels) Transfers: Sit to/from Stand Sit to Stand: Min assist, +2 safety/equipment           General transfer comment: cues for hand placement, assist to power up    Ambulation/Gait Ambulation/Gait assistance: Min assist, +2 safety/equipment Gait Distance (Feet): 6 Feet Assistive device: Rolling walker (2 wheels) Gait Pattern/deviations: Step-through pattern, Trunk flexed, Narrow base of support       General Gait Details: assist to steady, cues for trunk extension as able, breathing  Stairs            Wheelchair Mobility    Modified Rankin (Stroke Patients Only)       Balance Overall balance assessment: Needs assistance Sitting-balance support: No upper extremity supported, Feet supported Sitting balance-Leahy Scale: Fair     Standing balance support: During functional activity, Reliant on assistive device for balance Standing balance-Leahy Scale: Poor Standing balance comment: limited dt abd pain                             Pertinent Vitals/Pain Pain Assessment Pain Assessment: Faces Faces Pain Scale: Hurts whole lot Pain Descriptors / Indicators: Discomfort, Grimacing, Guarding Pain Intervention(s): Limited activity  within patient's tolerance, Monitored during session, Premedicated before session, Repositioned    Home Living Family/patient expects to be discharged to:: Private residence Living Arrangements: Spouse/significant  other Available Help at Discharge: Family   Home Access: Stairs to enter   Technical brewer of Steps: "a couple"   Home Layout: One level Home Equipment: None Additional Comments: wife works, Printmaker also available    Prior Function Prior Level of Function : Independent/Modified Independent;Working/employed;Driving             Mobility Comments: drives truck local distances, [place]       Journalist, newspaper        Extremity/Trunk Assessment   Upper Extremity Assessment Upper Extremity Assessment: Overall WFL for tasks assessed    Lower Extremity Assessment Lower Extremity Assessment: Overall WFL for tasks assessed       Communication   Communication: No difficulties  Cognition Arousal/Alertness: Awake/alert Behavior During Therapy: WFL for tasks assessed/performed Overall Cognitive Status: Within Functional Limits for tasks assessed                                          General Comments      Exercises     Assessment/Plan    PT Assessment Patient needs continued PT services  PT Problem List Decreased strength;Decreased activity tolerance;Decreased range of motion;Decreased balance;Decreased mobility;Decreased knowledge of use of DME       PT Treatment Interventions DME instruction;Functional mobility training;Gait training;Therapeutic exercise;Balance training;Patient/family education;Therapeutic activities    PT Goals (Current goals can be found in the Care Plan section)  Acute Rehab PT Goals Patient Stated Goal: home PT Goal Formulation: With patient Time For Goal Achievement: 07/13/22 Potential to Achieve Goals: Good    Frequency Min 3X/week     Co-evaluation               AM-PAC PT "6 Clicks" Mobility  Outcome Measure Help needed turning from your back to your side while in a flat bed without using bedrails?: A Little Help needed moving from lying on your back to sitting on the side of a flat bed without using  bedrails?: A Little Help needed moving to and from a bed to a chair (including a wheelchair)?: A Little Help needed standing up from a chair using your arms (e.g., wheelchair or bedside chair)?: A Little Help needed to walk in hospital room?: A Little Help needed climbing 3-5 steps with a railing? : A Lot 6 Click Score: 17    End of Session Equipment Utilized During Treatment: Gait belt Activity Tolerance: Patient tolerated treatment well Patient left: in chair;with call bell/phone within reach;with family/visitor present Nurse Communication: Mobility status PT Visit Diagnosis: Unsteadiness on feet (R26.81);Other abnormalities of gait and mobility (R26.89)    Time: 1434-1450 PT Time Calculation (min) (ACUTE ONLY): 16 min   Charges:   PT Evaluation $PT Eval Low Complexity: [place], PT  Acute Rehab Dept [facility]) [phone]  [facility] Weekend Pager [facility] only)  [phone]  06/29/2022   [facility] 06/29/2022, 3:39 PM

## 2022-06-29 NOTE — Progress Notes (Addendum)
NAME:  Gabriel Dalton, MRN:  [id number], DOB:  1985/09/24, LOS: 3 ADMISSION DATE:  06/24/2022, CONSULTATION DATE:  06/25/22 REFERRING MD:  Erlinda Hong - TRH , CHIEF COMPLAINT: endotracheally intubated post op    History of Present Illness:   37 yo M chronic abd pain, wt loss, gallstones (for which he saw CCS outpt 2/8 and chole was offered but not yet scheduled) presented to [facility] ED 2/13 with CC abd pain.   CCS was consulted 2/13 and pt was posted for lap chole 2/14. Op course c/b  identification of numerous small bowel perforations and was converted to open surgery, with evacuation of 1L bowel contents.   PCCM was called ,remained intubated and transfer to ICU   Pertinent  Medical History  Chronic abd pain   Significant Hospital Events: Including procedures, antibiotic start and stop dates in addition to other pertinent events   2/13 admit. CCS consult for chole eval given known hx gallstones  2/14 intended lap chole --> ex lap with identification of small bowel perfs req resection + feculent peritonitis. To ICU post op intubated. Added zosyn  2/15 remains intubated sedated w open abd  2/16 OR for washout and abdominal closure, adding D5 with hypoglycemia on low rate TPN  2/17 extubated  Interim History / Subjective:  Did well postextubation yesterday. Changed to fentanyl for breakthrough pain while on morphine. Remains on IV Tylenol    Objective   Blood pressure 139/88, pulse 85, temperature 98.9 F (37.2 C), temperature source Oral, resp. rate (!) 27, height 5' 8"$  (1.727 m), weight 48.3 kg, SpO2 98 %.    Vent Mode: PSV;CPAP FiO2 (%):  [30 %] 30 % PEEP:  [5 cmH20] 5 cmH20 Pressure Support:  [10 cmH20] 10 cmH20   Intake/Output Summary (Last 24 hours) at 06/29/2022 0820 Last data filed at 06/29/2022 0547 Gross per 24 hour  Intake 2659.86 ml  Output 2935 ml  Net -275.14 ml    Filed Weights   06/25/22 2040 06/25/22 2053 06/26/22 1045  Weight: 48.8 kg 49.8 kg 48.3 kg     Examination: General: Chronically ill thin appearing adult M, in pain HENT: NCAT pink mm anicteric sclera  Lungs: Bilateral clear breath sounds, no accessory muscle use Cardiovascular: rr s1s2 cap refill brisk  Abdomen: Midline dressing, tender, nondistended Extremities: Symmetrically decr muscle mass. No acute deformity  Neuro: Alert, interactive, nonfocal  Chest x-ray 2/14, no new infiltrates Labs show stable hyponatremia, hypokalemia, hypophosphatemia and hypomagnesemia, mild leukocytosis, stable anemia  Resolved Hospital Problem list    Postop mechanical ventilation  Assessment & Plan:   Small bowel perf due to SBO s/p resection, reanastomosis Sepsis due to feculent peritonitis Cholelithiasis  -Postop care per surgery -NG tube came out, trying without it, reinsert only if he develops nausea/vomiting -Continue Zosyn x 5 days until 2/21   Severe protein calorie malnutrition Hypoglycemia  Unintentional wt loss Chronic abd pain   -TNA depending on bowel recovery -dc D5W -May need further workup for Ehlers-Danlos or other etiology in the future -Changed to Dilaudid for pain -oob to chair  BPH  -Foley placed by urology since nursing unable to place -Leave in x 1 more day   Anemia, of critical illness -Transfuse only for hemoglobin less than 7  Hyponatremia, mild Hypokalemia ,Hypophosphatemia, hypomagnesemia P -Replete  as needed and follow  He can be transferred to floor soon  Best Practice (right click and "Reselect all SmartList Selections" daily)   Diet/type: NPO DVT prophylaxis: SCD GI prophylaxis:  PPI Lines: N/A Foley:  Yes, and it is still needed Code Status:  full code Last date of multidisciplinary goals of care discussion [pt &wife updated at bedside 2/18]   Labs   CBC: Recent Labs  Lab 06/24/22 1400 06/25/22 2016 06/26/22 0538 06/27/22 0431 06/28/22 0323 06/29/22 0736  WBC 7.9 7.3 16.1* 15.4* 20.7* 14.0*  NEUTROABS 5.2  --   13.9*  --   --   --   HGB 11.7* 11.1* 11.2* 9.2* 8.7* 9.2*  HCT 32.8* 32.8* 32.6* 26.1* 25.5* 26.0*  MCV 83.0 86.3 84.9 83.7 86.1 82.3  PLT 590* 537* 451* 380 398 373     Basic Metabolic Panel: Recent Labs  Lab 06/24/22 1400 06/26/22 0538 06/27/22 0431 06/28/22 0544 06/29/22 0736  NA 129* 131* 133* 132* 132*  K 4.0 4.5 3.8 4.0 3.4*  CL 90* 99 101 96* 96*  CO2 26 22 24 27 28  $ GLUCOSE 113* 79 110* 134* 124*  BUN 21* 17 14 13 9  $ CREATININE 0.81 0.76 0.57* 0.57* 0.55*  CALCIUM 9.3 8.5* 8.1* 8.2* 8.2*  MG  --  2.0 2.1 2.1 1.6*  PHOS  --  2.9 2.2* 2.9 1.8*    GFR: Estimated Creatinine Clearance: 88 mL/min (A) (by C-G formula based on SCr of 0.55 mg/dL (L)). Recent Labs  Lab 06/26/22 0538 06/27/22 0431 06/28/22 0323 06/29/22 0736  WBC 16.1* 15.4* 20.7* 14.0*     Liver Function Tests: Recent Labs  Lab 06/24/22 1400 06/26/22 0538 06/27/22 0431  AST 19 18 26  $ ALT 22 14 17  $ ALKPHOS 82 50 43  BILITOT 0.5 1.1 0.5  PROT 7.0 5.8* 4.9*  ALBUMIN 3.2* 2.7* 2.1*    Recent Labs  Lab 06/24/22 1400  LIPASE 30    No results for input(s): "AMMONIA" in the last 168 hours.  ABG    Component Value Date/Time   PHART 7.41 06/26/2022 0823   PCO2ART 38 06/26/2022 0823   PO2ART 303 (H) 06/26/2022 0823   HCO3 24.1 06/26/2022 0823   ACIDBASEDEF 0.3 06/26/2022 0823   O2SAT 99.7 06/26/2022 0823     Coagulation Profile: No results for input(s): "INR", "PROTIME" in the last 168 hours.  Cardiac Enzymes: No results for input(s): "CKTOTAL", "CKMB", "CKMBINDEX", "TROPONINI" in the last 168 hours.  HbA1C: Hgb A1c MFr Bld  Date/Time Value Ref Range Status  06/24/2022 09:42 PM 5.8 (H) 4.8 - 5.6 % Final    Comment:    (NOTE) Pre diabetes:          5.7%-6.4%  Diabetes:              >6.4%  Glycemic control for   <7.0% adults with diabetes     CBG: Recent Labs  Lab 06/28/22 1544 06/28/22 1955 06/29/22 0010 06/29/22 0355 06/29/22 0759  GLUCAP 111* 112* 124* 113*  118*       Kara Mead MD. FCCP. [facility] Pager : [phone]  If no response to pager , please call [phone] until 7 pm After 7:00 pm call Elink  [phone]     06/29/2022, 8:20 AM

## 2022-06-29 NOTE — Progress Notes (Signed)
PHARMACY - TOTAL PARENTERAL NUTRITION CONSULT NOTE   Indication: malnutrition, bowel perforation, expected prolonged ileus  Patient Measurements: Height: 5' 8"$  (172.7 cm) Weight: 48.3 kg (106 lb 7.7 oz) IBW/kg (Calculated) : 68.4 TPN AdjBW (KG): 49.8 Body mass index is 16.19 kg/m.  Assessment: 37 yoM admitted with abdominal pain, weight loss.  Underwent laparoscopic cholecystectomy on 2/14 and was found with multiple bowel perforations and stool contamination of abdomen, converted to exploratory laparotomy, with small bowel resection and reanastomosis, and temporary abdominal closure. Remains intubated with open abdomen and plan for repeat washout later this week.  Per [facility], suspect he may have prolonged post op ileus.    Glucose / Insulin: No noted hx DM.  A1c 5.8. Goal BG <150 - Hypoglycemia 2/17 ~4am with CBG 28 (despite TPN and D5 infusions).   - No further hypoglycemia episodes.  CBGs: 111-137 - Dexamethasone 36m IV intra-op on 2/14, 2/16 Electrolytes: Na remains slightly low (132, on D5 infusion), K 3.4, Cl 96, Phos 1.8, Mag 1.6.    Renal: SCr < 1 Difficult urethral catheterization performed by urology on 2/14 Hepatic: WNL, low albumin, TG up to 241 on 2/16. Propofol off 2/15  Intake / Output; MIVF: mIVF:  D5 at 40 ml/hr.  -UOP: 2875 mL, NG output: 60 mL (NG tube accidentally removed) GI Imaging: GI Surgeries / Procedures:  2/14 OR: small bowel resection and reanastomosis, open abdomen 2/16 OR: exp lap, washout and abdominal closure   Central access: Double lumen PICC placed 2/15 TPN start date: 2/15  Nutritional Goals: Goal TPN rate is 80 mL/hr to provide 96 g of protein and 2039 kcals per day.  RD Assessment: Estimated Needs Total Energy Estimated Needs: 1950-2100 kcals Total Protein Estimated Needs: 90-100 grams Total Fluid Estimated Needs: >/= 1.9L  Current Nutrition:  NPO TPN   Plan:  Now:  Mag 2g, KPhos 334ml per MD At 1800:  Continue TPN at 55 mL/hr  Do  NOT advance rate today given high risk of refeeding syndrome and electrolyte abnormalities Provides 66 g protein, 1402 kcal, 238 g dextrose   Electrolytes in TPN:  Na 150 mEq/L K 60 mEq/L Ca 36m38mL Mg 36mE8m Phos 20 mmol/L.  Cl:Ac max Cl  Thiamine 100mg33mTPN x5 days (2/15 - 2/19) Add standard MVI and trace elements to TPN SSI d/c by MD.  Continue CBG checks q4h.  Resume insulin coverage if CBGs > 150s.   mIVF: D5 infusion d/c by MD Monitor TPN labs on Mon/Thurs. Recheck electrolytes tomorrow morning.  ChrisGretta ArabmD, BCPS [facility] main pharmacy [phone]/2024 10:24 AM

## 2022-06-29 NOTE — Progress Notes (Addendum)
2 Days Post-Op   Subjective/Chief Complaint: Sore, no flatus, ng tube came out during bath, awake and alert   Objective: Vital signs in last 24 hours: Temp:  [98.1 F (36.7 C)-99 F (37.2 C)] 98.9 F (37.2 C) (02/18 0400) Pulse Rate:  [68-113] 85 (02/18 0605) Resp:  [10-27] 27 (02/18 0605) BP: (131-164)/(74-107) 139/88 (02/18 0605) SpO2:  [96 %-100 %] 97 % (02/18 0605) FiO2 (%):  [30 %] 30 % (02/17 0856) Last BM Date :  (PTA)  Intake/Output from previous day: 02/17 0701 - 02/18 0700 In: 2772.8 [I.V.:2078; NG/GT:50; IV Piggyback:644.8] Out: 2935 [Urine:2875; Emesis/NG output:60] Intake/Output this shift: No intake/output data recorded.  Ab soft approp tender wound clean  Lab Results:  Recent Labs    06/27/22 0431 06/28/22 0323  WBC 15.4* 20.7*  HGB 9.2* 8.7*  HCT 26.1* 25.5*  PLT 380 398   BMET Recent Labs    06/27/22 0431 06/28/22 0544  NA 133* 132*  K 3.8 4.0  CL 101 96*  CO2 24 27  GLUCOSE 110* 134*  BUN 14 13  CREATININE 0.57* 0.57*  CALCIUM 8.1* 8.2*   PT/INR No results for input(s): "LABPROT", "INR" in the last 72 hours. ABG Recent Labs    06/26/22 0823  PHART 7.41  HCO3 24.1    Studies/Results: DG Abd 1 View  Result Date: 06/28/2022 CLINICAL DATA:  NG tube placement. EXAM: ABDOMEN - 1 VIEW COMPARISON:  CT, 05/20/2022. FINDINGS: Nasogastric tube passes well below the diaphragm, tip in the mid stomach. Dilated small bowel loops are noted in the visualized central abdomen. There is opacity in the medial left upper lobe, that appears to of progressed when compared to the chest radiograph dated 06/25/2022. This may reflect atelectasis. IMPRESSION: 1. Well-positioned nasal/orogastric tube. 2. Dilated small bowel loops consistent with a partial small bowel obstruction. This could be further assessed with CT. 3. Increased opacity in the medial left upper lobe suggestive of atelectasis. Electronically Signed   By: Lajean Manes M.D.   On: 06/28/2022 09:36     Anti-infectives: Anti-infectives (From admission, onward)    Start     Dose/Rate Route Frequency Ordered Stop   06/26/22 0000  piperacillin-tazobactam (ZOSYN) IVPB 3.375 g        3.375 g 12.5 mL/hr over 240 Minutes Intravenous Every 8 hours 06/25/22 1832     06/25/22 1815  piperacillin-tazobactam (ZOSYN) IVPB 3.375 g        3.375 g 100 mL/hr over 30 Minutes Intravenous  Once 06/25/22 1802 06/25/22 1830   06/25/22 1756  piperacillin-tazobactam (ZOSYN) 3.375 GM/50ML IVPB       Note to Pharmacy: Ward, Christa K: cabinet override      06/25/22 1756 06/27/22 0333   06/25/22 1630  ceFAZolin (ANCEF) IVPB 2g/100 mL premix        2 g 200 mL/hr over 30 Minutes Intravenous  Once 06/25/22 1534 06/25/22 1640       Assessment/Plan: POD 4/2 s/p ex lap with SBR and washout for feculent peritonitis for SB perforation from SBO by Dr. Thermon Leyland  06/25/22 -feculent peritonitis found in OR with 2 SB perforations noted from obstructive process.  Unclear etiology. Mother had something similar and wife states sister is having some GI issues, ? Need for further work up to rule out [employer] or other etiology -NG came out so will try without, if has n/v needs replaced. Will wait for bowel function to advance diet and do po meds -cont zosyn for 5 days after  second operation -discussed operative findings with him today -continue bid dressing changes -will continue ofirmev, add scheduled robaxin and do dilaudid for pain control -would leave foley another 24 hours given difficulty placing -oob, pulm toilet -await pathology on small bowel   FEN - NPO/IVFs per primary/TPN VTE - lovenox ID - zosyn d2/5 Foley - in place by urology last night due to inability to place on floor.  would leave this another 24 hours   Rolm Bookbinder 06/29/2022

## 2022-06-30 ENCOUNTER — Ambulatory Visit: Payer: 59 | Admitting: Student

## 2022-06-30 ENCOUNTER — Ambulatory Visit: Payer: 59 | Admitting: Internal Medicine

## 2022-06-30 DIAGNOSIS — K659 Peritonitis, unspecified: Secondary | ICD-10-CM | POA: Diagnosis not present

## 2022-06-30 DIAGNOSIS — R1010 Upper abdominal pain, unspecified: Secondary | ICD-10-CM | POA: Diagnosis not present

## 2022-06-30 DIAGNOSIS — R200 Anesthesia of skin: Secondary | ICD-10-CM

## 2022-06-30 DIAGNOSIS — K8000 Calculus of gallbladder with acute cholecystitis without obstruction: Secondary | ICD-10-CM

## 2022-06-30 DIAGNOSIS — R202 Paresthesia of skin: Secondary | ICD-10-CM

## 2022-06-30 DIAGNOSIS — E43 Unspecified severe protein-calorie malnutrition: Secondary | ICD-10-CM | POA: Diagnosis not present

## 2022-06-30 DIAGNOSIS — K8020 Calculus of gallbladder without cholecystitis without obstruction: Secondary | ICD-10-CM | POA: Diagnosis not present

## 2022-06-30 LAB — GLUCOSE, CAPILLARY
Glucose-Capillary: 101 mg/dL — ABNORMAL HIGH (ref 70–99)
Glucose-Capillary: 108 mg/dL — ABNORMAL HIGH (ref 70–99)
Glucose-Capillary: 113 mg/dL — ABNORMAL HIGH (ref 70–99)
Glucose-Capillary: 128 mg/dL — ABNORMAL HIGH (ref 70–99)
Glucose-Capillary: 83 mg/dL (ref 70–99)
Glucose-Capillary: 93 mg/dL (ref 70–99)
Glucose-Capillary: 97 mg/dL (ref 70–99)
Glucose-Capillary: 98 mg/dL (ref 70–99)

## 2022-06-30 LAB — CBC
HCT: 28.6 % — ABNORMAL LOW (ref 39.0–52.0)
Hemoglobin: 10.2 g/dL — ABNORMAL LOW (ref 13.0–17.0)
MCH: 29.2 pg (ref 26.0–34.0)
MCHC: 35.7 g/dL (ref 30.0–36.0)
MCV: 81.9 fL (ref 80.0–100.0)
Platelets: 406 10*3/uL — ABNORMAL HIGH (ref 150–400)
RBC: 3.49 MIL/uL — ABNORMAL LOW (ref 4.22–5.81)
RDW: 14.6 % (ref 11.5–15.5)
WBC: 15.1 10*3/uL — ABNORMAL HIGH (ref 4.0–10.5)
nRBC: 0 % (ref 0.0–0.2)

## 2022-06-30 LAB — PHOSPHORUS: Phosphorus: 2.4 mg/dL — ABNORMAL LOW (ref 2.5–4.6)

## 2022-06-30 LAB — COMPREHENSIVE METABOLIC PANEL
ALT: 18 U/L (ref 0–44)
AST: 24 U/L (ref 15–41)
Albumin: 2.3 g/dL — ABNORMAL LOW (ref 3.5–5.0)
Alkaline Phosphatase: 63 U/L (ref 38–126)
Anion gap: 11 (ref 5–15)
BUN: 8 mg/dL (ref 6–20)
CO2: 26 mmol/L (ref 22–32)
Calcium: 8.2 mg/dL — ABNORMAL LOW (ref 8.9–10.3)
Chloride: 94 mmol/L — ABNORMAL LOW (ref 98–111)
Creatinine, Ser: 0.52 mg/dL — ABNORMAL LOW (ref 0.61–1.24)
GFR, Estimated: >60 mL/min (ref >60)
Glucose, Bld: 102 mg/dL — ABNORMAL HIGH (ref 70–99)
Potassium: 3.5 mmol/L (ref 3.5–5.1)
Sodium: 131 mmol/L — ABNORMAL LOW (ref 135–145)
Total Bilirubin: 0.8 mg/dL (ref 0.3–1.2)
Total Protein: 5.6 g/dL — ABNORMAL LOW (ref 6.5–8.1)

## 2022-06-30 LAB — TRIGLYCERIDES: Triglycerides: 361 mg/dL — ABNORMAL HIGH (ref <150)

## 2022-06-30 LAB — MAGNESIUM: Magnesium: 1.9 mg/dL (ref 1.7–2.4)

## 2022-06-30 MED ORDER — ACETAMINOPHEN 10 MG/ML IV SOLN
1000.0000 mg | Freq: Four times a day (QID) | INTRAVENOUS | Status: AC
  Administered 2022-06-30 – 2022-07-01 (×4): 1000 mg via INTRAVENOUS
  Filled 2022-06-30 (×4): qty 100

## 2022-06-30 MED ORDER — TRAVASOL 10 % IV SOLN
INTRAVENOUS | Status: AC
  Filled 2022-06-30: qty 780

## 2022-06-30 MED ORDER — FENTANYL CITRATE PF 50 MCG/ML IJ SOSY
12.5000 ug | PREFILLED_SYRINGE | Freq: Once | INTRAMUSCULAR | Status: AC
  Administered 2022-06-30: 12.5 ug via INTRAVENOUS
  Filled 2022-06-30: qty 1

## 2022-06-30 MED ORDER — HYDROMORPHONE HCL 1 MG/ML IJ SOLN
0.5000 mg | INTRAMUSCULAR | Status: DC | PRN
  Administered 2022-06-30 (×2): 2 mg via INTRAVENOUS
  Administered 2022-06-30 – 2022-07-01 (×2): 1 mg via INTRAVENOUS
  Administered 2022-07-01: 2 mg via INTRAVENOUS
  Administered 2022-07-01 (×2): 1 mg via INTRAVENOUS
  Administered 2022-07-01 – 2022-07-04 (×24): 2 mg via INTRAVENOUS
  Filled 2022-06-30 (×8): qty 2
  Filled 2022-06-30 (×2): qty 1
  Filled 2022-06-30 (×7): qty 2
  Filled 2022-06-30: qty 1
  Filled 2022-06-30 (×2): qty 2
  Filled 2022-06-30: qty 1
  Filled 2022-06-30 (×10): qty 2

## 2022-06-30 MED ORDER — POTASSIUM PHOSPHATES 15 MMOLE/5ML IV SOLN
15.0000 mmol | Freq: Once | INTRAVENOUS | Status: AC
  Administered 2022-06-30: 15 mmol via INTRAVENOUS
  Filled 2022-06-30: qty 5

## 2022-06-30 MED ORDER — TIZANIDINE HCL 4 MG PO TABS
2.0000 mg | ORAL_TABLET | Freq: Once | ORAL | Status: AC
  Administered 2022-06-30: 2 mg via ORAL
  Filled 2022-06-30: qty 1

## 2022-06-30 MED ORDER — FENTANYL 25 MCG/HR TD PT72
1.0000 | MEDICATED_PATCH | TRANSDERMAL | Status: DC
  Administered 2022-06-30 – 2022-07-09 (×4): 1 via TRANSDERMAL
  Filled 2022-06-30 (×4): qty 1

## 2022-06-30 MED ORDER — SIMETHICONE 80 MG PO CHEW
80.0000 mg | CHEWABLE_TABLET | Freq: Four times a day (QID) | ORAL | Status: DC
  Administered 2022-06-30 – 2022-07-12 (×44): 80 mg via ORAL
  Filled 2022-06-30 (×46): qty 1

## 2022-06-30 MED ORDER — POTASSIUM CHLORIDE 10 MEQ/100ML IV SOLN
10.0000 meq | INTRAVENOUS | Status: AC
  Administered 2022-06-30 (×4): 10 meq via INTRAVENOUS
  Filled 2022-06-30 (×4): qty 100

## 2022-06-30 MED ORDER — SODIUM CHLORIDE 0.9 % IV SOLN: INTRAVENOUS | Status: AC

## 2022-06-30 MED ORDER — METHOCARBAMOL 1000 MG/10ML IJ SOLN
1000.0000 mg | Freq: Three times a day (TID) | INTRAVENOUS | Status: DC
  Administered 2022-06-30 – 2022-07-10 (×30): 1000 mg via INTRAVENOUS
  Filled 2022-06-30 (×4): qty 1000
  Filled 2022-06-30: qty 10
  Filled 2022-06-30 (×12): qty 1000
  Filled 2022-06-30: qty 10
  Filled 2022-06-30 (×9): qty 1000
  Filled 2022-06-30: qty 10
  Filled 2022-06-30: qty 1000
  Filled 2022-06-30 (×2): qty 10
  Filled 2022-06-30 (×2): qty 1000

## 2022-06-30 NOTE — Progress Notes (Signed)
Physical Therapy Treatment Patient Details Name: Gabriel Dalton MRN: [id number] DOB: [birth year] Today's Date: 06/30/2022   History of Present Illness 37 year old admitted with abd pain, gallstones (for which he saw [facility] outpt 2/8 and chole was offered but not yet scheduled)  admitted with acute cholecystitis.   S/p ex-lap 2/14 and found with multiple bowel perforations and stool. Remained with open abdomen. PCCM consulted for vent management post-op. pt returned to OR for closure on 2/16. extubated 2/17    PT Comments    Pt admitted with above diagnosis. Pt agreeable to therapy intervention. Pt limited due to abdominal and rib 10/10 pain today. Communication with nurse prior to tx session and pt premedicated. Pt required increased time for all functional mobility tasks, min A for supine to sit and min guard for SPT bed to recliner. Pt is to transition to 3rd floor today.  Pt currently with functional limitations due to the deficits listed below (see PT Problem List). Pt will benefit from skilled PT to increase their independence and safety with mobility to allow discharge to the venue listed below.      Recommendations for follow up therapy are one component of a multi-disciplinary discharge planning process, led by the attending physician.  Recommendations may be updated based on patient status, additional functional criteria and insurance authorization.  Follow Up Recommendations  Home health PT     Assistance Recommended at Discharge Intermittent Supervision/Assistance  Patient can return home with the following A little help with walking and/or transfers;A little help with bathing/dressing/bathroom;Assistance with cooking/housework;Assist for transportation;Help with stairs or ramp for entrance   Equipment Recommendations  Other (comment) (TBD)    Recommendations for Other Services       Precautions / Restrictions Precautions Precautions: Other (comment) Precaution  Comments: TNA, multiple lines, abd incision Restrictions Weight Bearing Restrictions: No     Mobility  Bed Mobility Overal bed mobility: Needs Assistance Bed Mobility: Supine to Sit     Supine to sit: Min assist     General bed mobility comments: increased time, HOB elevated and use of bed rails    Transfers Overall transfer level: Needs assistance   Transfers: Bed to chair/wheelchair/BSC Sit to Stand: Min guard           General transfer comment: min cues for UE placement with pt demonstrating abiltiy to reach back for recliner with B UEs for improved eccentric control    Ambulation/Gait                   Stairs             Wheelchair Mobility    Modified Rankin (Stroke Patients Only)       Balance Overall balance assessment: Needs assistance Sitting-balance support: Feet supported, No upper extremity supported Sitting balance-Leahy Scale: Fair     Standing balance support: Single extremity supported, During functional activity Standing balance-Leahy Scale: Poor Standing balance comment: limited due to abdominal pain and pt maintained flexed posture                            Cognition Arousal/Alertness: Awake/alert Behavior During Therapy: WFL for tasks assessed/performed Overall Cognitive Status: Within Functional Limits for tasks assessed  Exercises      General Comments        Pertinent Vitals/Pain Pain Assessment: abdominal and rib pain  Pain Assessment: 0-10 Pain Score: 10-Worst pain ever Pain Intervention(s): Limited activity within patient's tolerance, Repositioned    Home Living                          Prior Function            PT Goals (current goals can now be found in the care plan section) Acute Rehab PT Goals Patient Stated Goal: home and decrease pain PT Goal Formulation: With patient Time For Goal Achievement:  07/13/22 Potential to Achieve Goals: Good    Frequency    Min 3X/week      PT Plan      Co-evaluation              AM-PAC PT "6 Clicks" Mobility   Outcome Measure  Help needed turning from your back to your side while in a flat bed without using bedrails?: A Little Help needed moving from lying on your back to sitting on the side of a flat bed without using bedrails?: A Little Help needed moving to and from a bed to a chair (including a wheelchair)?: A Little Help needed standing up from a chair using your arms (e.g., wheelchair or bedside chair)?: A Little Help needed to walk in hospital room?: A Little Help needed climbing 3-5 steps with a railing? : Total 6 Click Score: 16    End of Session   Activity Tolerance: Patient limited by pain;Patient limited by fatigue Patient left: in chair;with call bell/phone within reach;with chair alarm set   PT Visit Diagnosis: Unsteadiness on feet (R26.81);Other abnormalities of gait and mobility (R26.89);Muscle weakness (generalized) (M62.81);Difficulty in walking, not elsewhere classified (R26.2);Pain     Time: 1353-1415 PT Time Calculation (min) (ACUTE ONLY): 22 min  Charges:                        Baird Lyons, PT    Adair Patter 06/30/2022, 3:19 PM

## 2022-06-30 NOTE — Progress Notes (Signed)
NP Gabriel Dalton was notified that patient is complaining of abdominal pain 10 out 10. PRN IV Dilaudid every 3 hours was given at [place] and patient is asking for pain medication. I offered IV Robaxin but he states that doesn't do anything.

## 2022-06-30 NOTE — Progress Notes (Signed)
PROGRESS NOTE    Gabriel Dalton  [id number] DOB: 05-17-1985 DOA: 06/24/2022 PCP: Marrian Salvage, FNP     Brief Narrative:  37 yo BM PMHx chronic abd pain, wt loss, bronchitis, multiple gastric ulcers, tobacco abuse, gallstones (for which he saw [facility] outpt 2/8 and chole was offered but not yet scheduled) presented to [facility] ED 2/13 with CC abd pain. followed by gastroenterology lumbar as outpatient and has had extensive workup in past 3 years for similar presentation-he has had extensive workup including multiple CT scans, MRI, ultrasound, NM gastric emptying study, most recent endoscopy evaluation in August 2023 revealing esophagitis with no bleeding, gastritis and biopsy with mild nonspecific reactive gastropathy, he has had positive occult blood 05/31/2022 which was attributed to hemorrhoids.  He endorses about 60 pound weight loss since the symptoms started.  He was recently seen by general surgery on 06/19/2022 to discuss cholecystectomy given that he has a gallstone.     CCS was consulted 2/13 and pt was posted for lap chole 2/14. Op course c/b  identification of numerous small bowel perforations and was converted to open surgery, with evacuation of 1L bowel contents.   PCCM was called ,remained intubated and transfer to ICU    Subjective: 2/19 afebrile overnight, patient having significant pain, requiring Dilaudid every 4 hours currently no long-term pain medication.  Negative BM, negative gas.   Assessment & Plan: Covid vaccination;   Principal Problem:   Cholelithiasis Active Problems:   Severe protein-calorie malnutrition ([place])   Small bowel perforation ([place])   Endotracheal tube present   Gallstones   Peritonitis ([place])   Endotracheally intubated  Sepsis due to feculent peritonitis -Normal saline 75l/hr -Secondary to bowel perforation.  Treat underlying condition -Continue current antibiotics for minimal 7 days.  Small bowel perf due to SBO s/p  resection, reanastomosis -Care per surgery team. -See severe protein calorie malnutrition  Cholelithiasis -See small bowel perforation -Reinsert NG tube if patient begins to experience nausea/vomiting again.  Acute on chronic abd pain -2/19 Fentanyl patch 25 mcg/h every 3 days -2/19 continue Dilaudid for breakthrough pain will attempt to titrate down -2/19 RN will attempt to ambulate patient   Severe protein calorie malnutrition -Continue TPN until surgery clears for p.o.  Hypoglycemia  CBG (last 3)  Recent Labs    06/30/22 0423 06/30/22 0745 06/30/22 1156  GLUCAP 108* 97 93  -Patient now on TPN should not be a problem.   BPH -Foley placed by urology. - Once patient more mobile will remove 10 voiding trial.    Anemia, of critical illness -Anemia panel pending - Transfuse for hemoglobin<7   Hyponatremia, mild 2/19 see sepsis   Hypokalemia - Potassium goal>4 -Potassium IV 40 mEq  Hypomagnesmia - Magnesium goal> 2 -2/10 on TPN, will monitor and add additional if required  Hypophosphatemia -Phosphorus goal> 2.5        Mobility Assessment (last 72 hours)     Mobility Assessment     Row Name 06/29/22 1537           What is the highest level of mobility based on the progressive mobility assessment? Level 5 (Walks with assist in room/hall) - Balance while stepping forward/back and can walk in room with assist - Complete                      DVT prophylaxis: Lovenox Code Status: Full Family Communication:  Status is: Inpatient    Dispo: The patient is from: Home  Anticipated d/c is to: Home              Anticipated d/c date is: > 3 days              Patient currently is not medically stable to d/c.      Consultants:  PCCM [facility]   Procedures/Significant Events:  2/13 admit. [facility] consult for chole eval given known hx gallstones  2/14 intended lap chole --> ex lap with identification of small bowel perfs req resection +  feculent peritonitis. To ICU post op intubated. Added zosyn Dr  Louanna Raw [facility] 2/15 remains intubated sedated w open abd  2/16 OR for washout and abdominal closure, adding D5 with hypoglycemia on low rate TPN Dr  Eddie Dibbles Stechschulte [facility] 2/17 extubated  I have personally reviewed and interpreted all radiology studies and my findings are as above.  VENTILATOR SETTINGS:    Cultures   Antimicrobials: Anti-infectives (From admission, onward)    Start     Ordered Stop   06/26/22 0000  piperacillin-tazobactam (ZOSYN) IVPB 3.375 g        06/25/22 1832     06/25/22 1815  piperacillin-tazobactam (ZOSYN) IVPB 3.375 g        06/25/22 1802 06/25/22 1830   06/25/22 1756  piperacillin-tazobactam (ZOSYN) 3.375 GM/50ML IVPB       Note to Pharmacy: Ward, Christa K: cabinet override   06/25/22 1756 06/27/22 0333   06/25/22 1630  ceFAZolin (ANCEF) IVPB 2g/100 mL premix        06/25/22 1534 06/25/22 1640         Devices    LINES / TUBES:      Continuous Infusions:  sodium chloride Stopped (06/27/22 0832)   methocarbamol (ROBAXIN) IV Stopped (06/28/22 2047)   methocarbamol (ROBAXIN) IV Stopped (06/30/22 0056)   piperacillin-tazobactam Stopped (06/30/22 0348)   potassium PHOSPHATE IVPB (in mmol)     TPN ADULT (ION) 55 mL/hr at 06/30/22 0226   TPN ADULT (ION)       Objective: Vitals:   06/30/22 0429 06/30/22 0500 06/30/22 0600 06/30/22 0800  BP:  (!) 144/95 (!) 156/100 (!) 155/104  Pulse:  71 76 83  Resp:  12 17 (!) 21  Temp: 98.3 F (36.8 C)   98.3 F (36.8 C)  TempSrc: Oral     SpO2:  100% 100% 99%  Weight:      Height:        Intake/Output Summary (Last 24 hours) at 06/30/2022 [id number] Last data filed at 06/30/2022 [id number] Gross per 24 hour  Intake 2448.86 ml  Output 2075 ml  Net 373.86 ml   Filed Weights   06/25/22 2040 06/25/22 2053 06/26/22 1045  Weight: 48.8 kg 49.8 kg 48.3 kg    Examination:  General: A/O x 4, No acute respiratory distress, cachectic Eyes:  negative scleral hemorrhage, negative anisocoria, negative icterus ENT: Negative Runny nose, negative gingival bleeding, Neck:  Negative scars, masses, torticollis, lymphadenopathy, JVD Lungs: Clear to auscultation bilaterally without wheezes or crackles Cardiovascular: Regular rate and rhythm without murmur gallop or rub normal S1 and S2 Abdomen: Positive abdominal pain 9-10/10, ex lap covered in clean, nondistended, negative soft, bowel sounds, no rebound, no ascites, no appreciable mass Extremities: No significant cyanosis, clubbing, or edema bilateral lower extremities Skin: Negative rashes, lesions, ulcers Psychiatric:  Negative depression, negative anxiety, negative fatigue, negative mania  Central nervous system:  Cranial nerves II through XII intact, tongue/uvula midline, all extremities muscle strength 5/5, sensation intact throughout, negative  expressive aphasia, negative receptive aphasia.  .     Data Reviewed: Care during the described time interval was provided by me .  I have reviewed this patient's available data, including medical history, events of note, physical examination, and all test results as part of my evaluation.  CBC: Recent Labs  Lab 06/24/22 1400 06/25/22 2016 06/26/22 0538 06/27/22 0431 06/28/22 0323 06/29/22 0736 06/30/22 0342  WBC 7.9   < > 16.1* 15.4* 20.7* 14.0* 15.1*  NEUTROABS 5.2  --  13.9*  --   --   --   --   HGB 11.7*   < > 11.2* 9.2* 8.7* 9.2* 10.2*  HCT 32.8*   < > 32.6* 26.1* 25.5* 26.0* 28.6*  MCV 83.0   < > 84.9 83.7 86.1 82.3 81.9  PLT 590*   < > 451* 380 398 373 406*   < > = values in this interval not displayed.   Basic Metabolic Panel: Recent Labs  Lab 06/26/22 0538 06/27/22 0431 06/28/22 0544 06/29/22 0736 06/30/22 0342  NA 131* 133* 132* 132* 131*  K 4.5 3.8 4.0 3.4* 3.5  CL 99 101 96* 96* 94*  CO2 22 24 27 28 26  $ GLUCOSE 79 110* 134* 124* 102*  BUN 17 14 13 9 8  $ CREATININE 0.76 0.57* 0.57* 0.55* 0.52*  CALCIUM 8.5*  8.1* 8.2* 8.2* 8.2*  MG 2.0 2.1 2.1 1.6* 1.9  PHOS 2.9 2.2* 2.9 1.8* 2.4*   GFR: Estimated Creatinine Clearance: 88 mL/min (A) (by C-G formula based on SCr of 0.52 mg/dL (L)). Liver Function Tests: Recent Labs  Lab 06/24/22 1400 06/26/22 0538 06/27/22 0431 06/30/22 0342  AST 19 18 26 24  $ ALT 22 14 17 18  $ ALKPHOS 82 50 43 63  BILITOT 0.5 1.1 0.5 0.8  PROT 7.0 5.8* 4.9* 5.6*  ALBUMIN 3.2* 2.7* 2.1* 2.3*   Recent Labs  Lab 06/24/22 1400  LIPASE 30   No results for input(s): "AMMONIA" in the last 168 hours. Coagulation Profile: No results for input(s): "INR", "PROTIME" in the last 168 hours. Cardiac Enzymes: No results for input(s): "CKTOTAL", "CKMB", "CKMBINDEX", "TROPONINI" in the last 168 hours. BNP (last 3 results) No results for input(s): "PROBNP" in the last 8760 hours. HbA1C: No results for input(s): "HGBA1C" in the last 72 hours. CBG: Recent Labs  Lab 06/29/22 1559 06/29/22 1938 06/30/22 0012 06/30/22 0423 06/30/22 0745  GLUCAP 107* 101* 83 108* 97   Lipid Profile: Recent Labs    06/30/22 0342  TRIG 361*   Thyroid Function Tests: No results for input(s): "TSH", "T4TOTAL", "FREET4", "T3FREE", "THYROIDAB" in the last 72 hours. Anemia Panel: No results for input(s): "VITAMINB12", "FOLATE", "FERRITIN", "TIBC", "IRON", "RETICCTPCT" in the last 72 hours. Sepsis Labs: No results for input(s): "PROCALCITON", "LATICACIDVEN" in the last 168 hours.  Recent Results (from the past 240 hour(s))  MRSA Next Gen by PCR, Nasal     Status: None   Collection Time: 06/25/22 10:10 PM   Specimen: Nasal Mucosa; Nasal Swab  Result Value Ref Range Status   MRSA by PCR Next Gen NOT DETECTED NOT DETECTED Final    Comment: (NOTE) The GeneXpert MRSA Assay (FDA approved for NASAL specimens only), is one component of a comprehensive MRSA colonization surveillance program. It is not intended to diagnose MRSA infection nor to guide or monitor treatment for MRSA infections. Test  performance is not FDA approved in patients less than 36 years old. Performed at [facility], [address] [place],  [place] [address]          Radiology Studies: DG Abd 1 View  Result Date: 06/28/2022 CLINICAL DATA:  NG tube placement. EXAM: ABDOMEN - 1 VIEW COMPARISON:  CT, 05/20/2022. FINDINGS: Nasogastric tube passes well below the diaphragm, tip in the mid stomach. Dilated small bowel loops are noted in the visualized central abdomen. There is opacity in the medial left upper lobe, that appears to of progressed when compared to the chest radiograph dated 06/25/2022. This may reflect atelectasis. IMPRESSION: 1. Well-positioned nasal/orogastric tube. 2. Dilated small bowel loops consistent with a partial small bowel obstruction. This could be further assessed with CT. 3. Increased opacity in the medial left upper lobe suggestive of atelectasis. Electronically Signed   By: Lajean Manes M.D.   On: 06/28/2022 09:36        Scheduled Meds:  Chlorhexidine Gluconate Cloth  6 each Topical Q0600   enoxaparin (LOVENOX) injection  40 mg Subcutaneous Daily   pantoprazole (PROTONIX) IV  40 mg Intravenous Q24H   sodium chloride flush  10-40 mL Intracatheter Q12H   Continuous Infusions:  sodium chloride Stopped (06/27/22 0832)   methocarbamol (ROBAXIN) IV Stopped (06/28/22 2047)   methocarbamol (ROBAXIN) IV Stopped (06/30/22 0056)   piperacillin-tazobactam Stopped (06/30/22 0348)   potassium PHOSPHATE IVPB (in mmol)     TPN ADULT (ION) 55 mL/hr at 06/30/22 0226   TPN ADULT (ION)       LOS: 4 days    Time spent:40 min    Bertie Simien, Geraldo Docker, MD [facility]   If 7PM-7AM, please contact night-coverage 06/30/2022, 8:38 AM

## 2022-06-30 NOTE — Progress Notes (Signed)
3 Days Post-Op   Subjective/Chief Complaint: Some pain in right chest wall/rib space today.  Worse with breathing.  Denies SOB.  This is new within the last few minutes of my arrival.  Sats are normal.  HR normal.  Denies gas pains.  No nausea or vomiting.  No flatus yet.   Objective: Vital signs in last 24 hours: Temp:  [98 F (36.7 C)-99 F (37.2 C)] 98.3 F (36.8 C) (02/19 0800) Pulse Rate:  [66-89] 83 (02/19 0800) Resp:  [12-30] 21 (02/19 0800) BP: (122-164)/(82-104) 155/104 (02/19 0800) SpO2:  [96 %-100 %] 99 % (02/19 0800) Last BM Date :  (PTA)  Intake/Output from previous day: 02/18 0701 - 02/19 0700 In: 2448.9 [I.V.:1282.6; IV Piggyback:1166.3] Out: 2075 [Urine:2075] Intake/Output this shift: No intake/output data recorded.  PE: Heart: regular Lungs: CTAB Abd: soft, approp tender, wound clean, ND GU: foley in place  Lab Results:  Recent Labs    06/29/22 0736 06/30/22 0342  WBC 14.0* 15.1*  HGB 9.2* 10.2*  HCT 26.0* 28.6*  PLT 373 406*   BMET Recent Labs    06/29/22 0736 06/30/22 0342  NA 132* 131*  K 3.4* 3.5  CL 96* 94*  CO2 28 26  GLUCOSE 124* 102*  BUN 9 8  CREATININE 0.55* 0.52*  CALCIUM 8.2* 8.2*   PT/INR No results for input(s): "LABPROT", "INR" in the last 72 hours. ABG No results for input(s): "PHART", "HCO3" in the last 72 hours.  Invalid input(s): "PCO2", "PO2"   Studies/Results: DG Abd 1 View  Result Date: 06/28/2022 CLINICAL DATA:  NG tube placement. EXAM: ABDOMEN - 1 VIEW COMPARISON:  CT, 05/20/2022. FINDINGS: Nasogastric tube passes well below the diaphragm, tip in the mid stomach. Dilated small bowel loops are noted in the visualized central abdomen. There is opacity in the medial left upper lobe, that appears to of progressed when compared to the chest radiograph dated 06/25/2022. This may reflect atelectasis. IMPRESSION: 1. Well-positioned nasal/orogastric tube. 2. Dilated small bowel loops consistent with a partial small  bowel obstruction. This could be further assessed with CT. 3. Increased opacity in the medial left upper lobe suggestive of atelectasis. Electronically Signed   By: Lajean Manes M.D.   On: 06/28/2022 09:36    Anti-infectives: Anti-infectives (From admission, onward)    Start     Dose/Rate Route Frequency Ordered Stop   06/26/22 0000  piperacillin-tazobactam (ZOSYN) IVPB 3.375 g        3.375 g 12.5 mL/hr over 240 Minutes Intravenous Every 8 hours 06/25/22 1832     06/25/22 1815  piperacillin-tazobactam (ZOSYN) IVPB 3.375 g        3.375 g 100 mL/hr over 30 Minutes Intravenous  Once 06/25/22 1802 06/25/22 1830   06/25/22 1756  piperacillin-tazobactam (ZOSYN) 3.375 GM/50ML IVPB       Note to Pharmacy: Ward, Christa K: cabinet override      06/25/22 1756 06/27/22 0333   06/25/22 1630  ceFAZolin (ANCEF) IVPB 2g/100 mL premix        2 g 200 mL/hr over 30 Minutes Intravenous  Once 06/25/22 1534 06/25/22 1640       Assessment/Plan: POD 5/3 s/p ex lap with SBR and washout for feculent peritonitis for SB perforation from SBO by Dr. Thermon Leyland  06/25/22 -feculent peritonitis found in OR with 2 SB perforations noted from obstructive process.  Unclear etiology. Mother had something similar and wife states sister is having some GI issues, ? Need for further work up to rule out  [employer]ler's Danlos or other etiology -Will wait for bowel function to advance diet and do po meds, but allow a few sips of clears from the floor to moisten mouth -cont zosyn for 5 days after second operation, WBC stable around 15K today -continue bid dressing changes -will continue ofirmev, add scheduled robaxin and do dilaudid for pain control -oob, pulm toilet -await pathology on small bowel -mobilize, pulm toilet   FEN - NPO x ice and few sips of liquids/IVFs per primary/TPN VTE - lovenox ID - zosyn d3/5 Foley - in place by urology due to inability to place on floor. Can DC today from surgical standpoint  SPCM -  TNA Post op acute respiratory failure - resolved   Henreitta Cea 06/30/2022

## 2022-06-30 NOTE — Progress Notes (Signed)
PHARMACY - TOTAL PARENTERAL NUTRITION CONSULT NOTE   Indication: malnutrition, bowel perforation, expected prolonged ileus  Patient Measurements: Height: 5' 8"$  (172.7 cm) Weight: 48.3 kg (106 lb 7.7 oz) IBW/kg (Calculated) : 68.4 TPN AdjBW (KG): 49.8 Body mass index is 16.19 kg/m.  Assessment: 17 yoM admitted with abdominal pain, weight loss.  Underwent laparoscopic cholecystectomy on 2/14 and was found with multiple bowel perforations and stool contamination of abdomen, converted to exploratory laparotomy, with small bowel resection and reanastomosis, and temporary abdominal closure. Remains intubated with open abdomen and plan for repeat washout later this week.  Per [facility], suspect he may have prolonged post op ileus.    Glucose / Insulin: No noted hx DM.  A1c 5.8. Goal BG <150 - Hypoglycemia 2/17 ~4am with CBG 28 (despite TPN and D5 infusions).   - No further hypoglycemia episodes.  CBGs: 101-118 - Dexamethasone 43m IV intra-op on 2/14, 2/16 Electrolytes: Na remains slightly low (131), K 3.5, Cl 94, Phos 2.4, Mag 1.9.    Renal: SCr < 1 Difficult urethral catheterization performed by urology on 2/14 Hepatic: WNL, low albumin, TG up to 361 today. Propofol off 2/15  Intake / Output; MIVF: mIVF dc'd 2/18.  -UOP: 2075 mL, NG output: NG tube accidentally removed 2/18, will only re-insert if develops N/V GI Imaging: GI Surgeries / Procedures:  2/14 OR: small bowel resection and reanastomosis, open abdomen 2/16 OR: exp lap, washout and abdominal closure   Central access: Double lumen PICC placed 2/15 TPN start date: 2/15  Nutritional Goals: Goal TPN rate is 80 mL/hr to provide 96 g of protein and 2039 kcals per day.  RD Assessment: Estimated Needs Total Energy Estimated Needs: 1950-2100 kcals Total Protein Estimated Needs: 90-100 grams Total Fluid Estimated Needs: >/= 1.9L  Current Nutrition:  NPO TPN   Plan:  Now:  KPhos 124ml  At 1800:  TPN 65 mL/hr  Provides 78 g  protein, 1656 kcal, 280 g dextrose   Electrolytes in TPN:  Na 150 mEq/L K 60 mEq/L Ca 75m75mL Mg 75mE27m Phos 20 mmol/L.  Cl:Ac max Cl  Thiamine 100mg32mTPN x5 days (2/15 - 2/19) Add standard MVI and trace elements to TPN SSI d/c by MD.  Continue CBG checks q4h.  Resume insulin coverage if CBGs > 150s.   mIVF: D5 infusion d/c by MD Monitor TPN labs on Mon/Thurs. Recheck electrolytes tomorrow morning.  Zaara Sprowl Peggyann JubarmD, BCPS Pharmacy: [phone] 7:08 AM

## 2022-06-30 NOTE — Progress Notes (Signed)
Nutrition Follow-up  DOCUMENTATION CODES:   Severe malnutrition in context of chronic illness  INTERVENTION:   - Increasing TPN tonight to 71m/hr which will provide 78g of protein, 281g dextrose, 390 IL kcals (24% total kcal) = 1657 kcal  - TPN management per pharmacy.    - Monitor magnesium, potassium, and phosphorus, MD to replete as needed, as pt is at risk for refeeding syndrome given severe malnutrition with a 22% weight loss in 11 months. - Continue 1053mthiamine in TPN.   - Would recommend advancing by only 200-300 kcal per day due to refeeding risk.   - Per pharmacy, goal TPN is 8066mr with provides 96g protein and 2039 kcals.  - Of note, patients triglycerides have been increasing (361 today), will need to monitor for need to withhold lipids.  - Monitor weight trends.    NUTRITION DIAGNOSIS:   Severe Malnutrition related to chronic illness as evidenced by severe fat depletion, severe muscle depletion, percent weight loss (22% weight loss in 11 months). *ongoing  GOAL:   Patient will meet greater than or equal to 90% of their needs *progressing with TPN  MONITOR:   PO intake, Supplement acceptance, Diet advancement, Weight trends  REASON FOR ASSESSMENT:   Consult New TPN/TNA  ASSESSMENT:   [age] male with of chronic abdomen pain and weight loss presented to the ED for further evaluation. He was being followed by gastroenterology lumbar as outpatient and has had extensive workup in past 3 years for similar presentation. Admitted after being found to have cholelithiasis.  2/13 Admit 2/14 ex-lap, found to have SB perf, s/p small bowel resection and reanastomosis, and temporary abdominal closure; remained intubated afterwards 2/16 washout and abdominal closure 2/17 Extubated  Patient remains on TPN, slowly advancing towards goal with close monitoring of electrolytes.  Still no bowel movement this admission.  Met with patient at bedside who reports continued  abdominal pain. Discussed TPN and addressed all questions.   Medications reviewed and include: Zosyn  Labs reviewed:  Na 131 Phosphorus 2.4 HA1C 5.8 Blood Glucose 83-126 x24 hours   Latest Reference Range & Units 06/26/22 05:38 06/27/22 04:31 06/30/22 03:42  Triglycerides <150 mg/dL 175 (H) 241 (H) 361 (H)  (H): Data is abnormally high   Diet Order:   Diet Order             Diet NPO time specified Except for: Ice Chips, Other (See Comments)  Diet effective now                   EDUCATION NEEDS:  Education needs have been addressed  Skin:  Skin Assessment: Reviewed RN Assessment  Last BM:  PTA  Height:  Ht Readings from Last 1 Encounters:  06/25/22 5' 8"$  (1.727 m)   Weight:  Wt Readings from Last 1 Encounters:  06/26/22 48.3 kg   Ideal Body Weight:     BMI:  Body mass index is 16.19 kg/m.  Estimated Nutritional Needs:  Kcal:  1950-2100 kcals Protein:  90-100 grams Fluid:  >/= 1.9L    [name], LDN For contact information, refer to [facility]

## 2022-07-01 ENCOUNTER — Inpatient Hospital Stay (HOSPITAL_COMMUNITY): Payer: 59

## 2022-07-01 DIAGNOSIS — E43 Unspecified severe protein-calorie malnutrition: Secondary | ICD-10-CM | POA: Diagnosis not present

## 2022-07-01 DIAGNOSIS — R200 Anesthesia of skin: Secondary | ICD-10-CM | POA: Diagnosis not present

## 2022-07-01 DIAGNOSIS — K659 Peritonitis, unspecified: Secondary | ICD-10-CM | POA: Diagnosis not present

## 2022-07-01 DIAGNOSIS — K8020 Calculus of gallbladder without cholecystitis without obstruction: Secondary | ICD-10-CM | POA: Diagnosis not present

## 2022-07-01 LAB — COMPREHENSIVE METABOLIC PANEL
ALT: 18 U/L (ref 0–44)
AST: 17 U/L (ref 15–41)
Albumin: 2.2 g/dL — ABNORMAL LOW (ref 3.5–5.0)
Alkaline Phosphatase: 74 U/L (ref 38–126)
Anion gap: 10 (ref 5–15)
BUN: 10 mg/dL (ref 6–20)
CO2: 24 mmol/L (ref 22–32)
Calcium: 8.3 mg/dL — ABNORMAL LOW (ref 8.9–10.3)
Chloride: 97 mmol/L — ABNORMAL LOW (ref 98–111)
Creatinine, Ser: 0.47 mg/dL — ABNORMAL LOW (ref 0.61–1.24)
GFR, Estimated: >60 mL/min (ref >60)
Glucose, Bld: 106 mg/dL — ABNORMAL HIGH (ref 70–99)
Potassium: 4.1 mmol/L (ref 3.5–5.1)
Sodium: 131 mmol/L — ABNORMAL LOW (ref 135–145)
Total Bilirubin: 0.8 mg/dL (ref 0.3–1.2)
Total Protein: 5.9 g/dL — ABNORMAL LOW (ref 6.5–8.1)

## 2022-07-01 LAB — GLUCOSE, CAPILLARY
Glucose-Capillary: 120 mg/dL — ABNORMAL HIGH (ref 70–99)
Glucose-Capillary: 113 mg/dL — ABNORMAL HIGH (ref 70–99)
Glucose-Capillary: 126 mg/dL — ABNORMAL HIGH (ref 70–99)
Glucose-Capillary: 101 mg/dL — ABNORMAL HIGH (ref 70–99)
Glucose-Capillary: 120 mg/dL — ABNORMAL HIGH (ref 70–99)
Glucose-Capillary: 121 mg/dL — ABNORMAL HIGH (ref 70–99)

## 2022-07-01 LAB — CBC WITH DIFFERENTIAL/PLATELET
Abs Immature Granulocytes: 0.42 10*3/uL — ABNORMAL HIGH (ref 0.00–0.07)
Basophils Absolute: 0.1 10*3/uL (ref 0.0–0.1)
Basophils Relative: 0 %
Eosinophils Absolute: 0.1 10*3/uL (ref 0.0–0.5)
Eosinophils Relative: 1 %
HCT: 30.6 % — ABNORMAL LOW (ref 39.0–52.0)
Hemoglobin: 10.8 g/dL — ABNORMAL LOW (ref 13.0–17.0)
Immature Granulocytes: 2 %
Lymphocytes Relative: 12 %
Lymphs Abs: 2.3 10*3/uL (ref 0.7–4.0)
MCH: 28.9 pg (ref 26.0–34.0)
MCHC: 35.3 g/dL (ref 30.0–36.0)
MCV: 81.8 fL (ref 80.0–100.0)
Monocytes Absolute: 2.7 10*3/uL — ABNORMAL HIGH (ref 0.1–1.0)
Monocytes Relative: 14 %
Neutro Abs: 14 10*3/uL — ABNORMAL HIGH (ref 1.7–7.7)
Neutrophils Relative %: 71 %
Platelets: 461 10*3/uL — ABNORMAL HIGH (ref 150–400)
RBC: 3.74 MIL/uL — ABNORMAL LOW (ref 4.22–5.81)
RDW: 14.7 % (ref 11.5–15.5)
WBC: 19.6 10*3/uL — ABNORMAL HIGH (ref 4.0–10.5)
nRBC: 0.1 % (ref 0.0–0.2)

## 2022-07-01 LAB — FOLATE: Folate: 5.3 ng/mL — ABNORMAL LOW (ref >5.9)

## 2022-07-01 LAB — IRON AND TIBC
Iron: 17 ug/dL — ABNORMAL LOW (ref 45–182)
Saturation Ratios: 11 % — ABNORMAL LOW (ref 17.9–39.5)
TIBC: 157 ug/dL — ABNORMAL LOW (ref 250–450)
UIBC: 140 ug/dL

## 2022-07-01 LAB — RETICULOCYTES
Immature Retic Fract: 19.4 % — ABNORMAL HIGH (ref 2.3–15.9)
RBC.: 3.71 MIL/uL — ABNORMAL LOW (ref 4.22–5.81)
Retic Count, Absolute: 14.8 10*3/uL — ABNORMAL LOW (ref 19.0–186.0)
Retic Ct Pct: 0.4 % (ref 0.4–3.1)

## 2022-07-01 LAB — PHOSPHORUS: Phosphorus: 2.8 mg/dL (ref 2.5–4.6)

## 2022-07-01 LAB — FERRITIN: Ferritin: 602 ng/mL — ABNORMAL HIGH (ref 24–336)

## 2022-07-01 LAB — VITAMIN B12: Vitamin B-12: 962 pg/mL — ABNORMAL HIGH (ref 180–914)

## 2022-07-01 LAB — MAGNESIUM: Magnesium: 2 mg/dL (ref 1.7–2.4)

## 2022-07-01 MED ORDER — TRAVASOL 10 % IV SOLN
INTRAVENOUS | Status: AC
  Filled 2022-07-01: qty 960

## 2022-07-01 MED ORDER — ACETAMINOPHEN 10 MG/ML IV SOLN
1000.0000 mg | Freq: Four times a day (QID) | INTRAVENOUS | Status: AC
  Administered 2022-07-01 – 2022-07-02 (×4): 1000 mg via INTRAVENOUS
  Filled 2022-07-01 (×4): qty 100

## 2022-07-01 MED ORDER — SODIUM CHLORIDE (PF) 0.9 % IJ SOLN
INTRAMUSCULAR | Status: AC
  Filled 2022-07-01: qty 50

## 2022-07-01 MED ORDER — IOHEXOL 9 MG/ML PO SOLN
500.0000 mL | ORAL | Status: AC
  Administered 2022-07-01 (×2): 500 mL via ORAL

## 2022-07-01 MED ORDER — HYDROMORPHONE HCL 1 MG/ML IJ SOLN
1.0000 mg | Freq: Once | INTRAMUSCULAR | Status: AC
  Administered 2022-07-01: 1 mg via INTRAVENOUS
  Filled 2022-07-01: qty 1

## 2022-07-01 MED ORDER — SODIUM CHLORIDE 0.9 % IV SOLN: INTRAVENOUS | Status: DC

## 2022-07-01 MED ORDER — IOHEXOL 9 MG/ML PO SOLN
ORAL | Status: AC
  Filled 2022-07-01: qty 1000

## 2022-07-01 MED ORDER — IOHEXOL 300 MG/ML  SOLN
100.0000 mL | Freq: Once | INTRAMUSCULAR | Status: AC | PRN
  Administered 2022-07-01: 100 mL via INTRAVENOUS

## 2022-07-01 NOTE — Progress Notes (Signed)
PHARMACY - TOTAL PARENTERAL NUTRITION CONSULT NOTE   Indication: malnutrition, bowel perforation, expected prolonged ileus  Patient Measurements: Height: 5' 8"$  (172.7 cm) Weight: 48.3 kg (106 lb 7.7 oz) IBW/kg (Calculated) : 68.4 TPN AdjBW (KG): 49.8 Body mass index is 16.19 kg/m.  Assessment: 63 yoM admitted with abdominal pain, weight loss.  Underwent laparoscopic cholecystectomy on 2/14 and was found with multiple bowel perforations and stool contamination of abdomen, converted to exploratory laparotomy, with small bowel resection and reanastomosis, and temporary abdominal closure.  TPN started on 06/25/22.    Glucose / Insulin: No noted hx DM.  A1c 5.8. Goal BG <150 - Hypoglycemia 2/17 ~4am with CBG 28 (despite TPN and D5 infusions).   - No further hypoglycemia episodes.  CBGs: 97-128 - Dexamethasone 21m IV intra-op on 2/14, 2/16 Electrolytes:  - Na remains slightly low (131), CL slightly low at 97 - other lytes wnl including CorrCa Renal: SCr < 1 - Difficult urethral catheterization performed by urology on 2/14 Hepatic: LFTs wnl - Albumin low - TG 361 on 2/19 - Propofol off 2/15  Intake / Output; MIVF: NS @75$  ml/hr  -UOP: 2075 mL, NG output: NG tube accidentally removed 2/18, will only re-insert if develops N/V GI Imaging: GI Surgeries / Procedures:  2/14 OR: small bowel resection and reanastomosis, open abdomen 2/16 OR: exp lap, washout and abdominal closure   Central access: Double lumen PICC placed 2/15 TPN start date: 2/15  Nutritional Goals: Goal TPN rate is 80 mL/hr to provide 96 g of protein and 2039 kcals per day.  - Thiamine 1086min TPN x5 days (2/15 - 2/19)  RD Assessment: Estimated Needs Total Energy Estimated Needs: 1950-2100 kcals Total Protein Estimated Needs: 90-100 grams Total Fluid Estimated Needs: >/= 1.9L  Current Nutrition:  NPO TPN   Plan:   At 1800:  D/W dietician, ok to increase TPN to goal rate of 80 mL/hr today Provides 96 g  protein, 2039 kcal, 345 g dextrose   Electrolytes in TPN:  Na 150 mEq/L K 50 mEq/L Ca 63m67mL Mg 63mE84m Phos 20 mmol/L.  Cl:Ac max Cl  Add standard MVI and trace elements to TPN SSI d/c by MD.  Continue CBG checks q4h.  Resume insulin coverage if CBGs > 150s.   mIVF: reduce NS rate to 60 ml/hr Monitor TPN labs on Mon/Thurs.  MD ordered CMET daily thru 2/24; phos and mag daily Triglyceride level on 2/21    Gabriel Schwarz Dia SitterarmD, BCPS 07/01/2022 7:15 AM

## 2022-07-01 NOTE — Progress Notes (Addendum)
4 Days Post-Op   Subjective/Chief Complaint: Still with pain today.  Still complaints more of pain in his lower chest on the sides.  No nausea still with NGT out, but no bowel function yet.  Feels gas moving.   Objective: Vital signs in last 24 hours: Temp:  [97.5 F (36.4 C)-99.2 F (37.3 C)] 98.4 F (36.9 C) (02/20 0917) Pulse Rate:  [76-101] 98 (02/20 0917) Resp:  [14-34] 14 (02/20 0917) BP: (125-154)/(90-108) 143/97 (02/20 0917) SpO2:  [95 %-100 %] 100 % (02/20 0917) Last BM Date :  (PTA)  Intake/Output from previous day: 02/19 0701 - 02/20 0700 In: 4611.8 [I.V.:2915.8; IV Piggyback:1696] Out: 4750 [Urine:4750] Intake/Output this shift: No intake/output data recorded.  PE: Heart: regular Lungs: CTAB Abd: soft, approp tender, wound clean, ND GU: foley in place  Lab Results:  Recent Labs    06/30/22 0342 07/01/22 0351  WBC 15.1* 19.6*  HGB 10.2* 10.8*  HCT 28.6* 30.6*  PLT 406* 461*   BMET Recent Labs    06/30/22 0342 07/01/22 0351  NA 131* 131*  K 3.5 4.1  CL 94* 97*  CO2 26 24  GLUCOSE 102* 106*  BUN 8 10  CREATININE 0.52* 0.47*  CALCIUM 8.2* 8.3*   PT/INR No results for input(s): "LABPROT", "INR" in the last 72 hours. ABG No results for input(s): "PHART", "HCO3" in the last 72 hours.  Invalid input(s): "PCO2", "PO2"   Studies/Results: No results found.  Anti-infectives: Anti-infectives (From admission, onward)    Start     Dose/Rate Route Frequency Ordered Stop   06/26/22 0000  piperacillin-tazobactam (ZOSYN) IVPB 3.375 g        3.375 g 12.5 mL/hr over 240 Minutes Intravenous Every 8 hours 06/25/22 1832     06/25/22 1815  piperacillin-tazobactam (ZOSYN) IVPB 3.375 g        3.375 g 100 mL/hr over 30 Minutes Intravenous  Once 06/25/22 1802 06/25/22 1830   06/25/22 1756  piperacillin-tazobactam (ZOSYN) 3.375 GM/50ML IVPB       Note to Pharmacy: Ward, Christa K: cabinet override      06/25/22 1756 06/27/22 0333   06/25/22 1630  ceFAZolin  (ANCEF) IVPB 2g/100 mL premix        2 g 200 mL/hr over 30 Minutes Intravenous  Once 06/25/22 1534 06/25/22 1640       Assessment/Plan: POD 6/4 s/p ex lap with SBR and washout for feculent peritonitis for SB perforation from SBO by Dr. Thermon Leyland  06/25/22 -feculent peritonitis found in OR with 2 SB perforations noted from obstructive process.  Unclear etiology. Mother had something similar and wife states sister is having some GI issues, ? Need for further work up to rule out [employer] or other etiology -Will wait for bowel function to advance diet and do po meds, but allow a few sips of clears from the floor to moisten mouth -cont zosyn for 5 days after second operation, WBC up to 19K today and having pain (not unexpected though).  Will order CT scan as he is high risk for post op complication such as abscess. -continue bid dressing changes -will continue ofirmev, scheduled robaxin and do dilaudid for pain control -oob, pulm toilet -pathology on small bowel benign with significant luminal narrowing at one point -mobilize, pulm toilet   FEN - NPO x ice and few sips of liquids/IVFs per primary/TPN VTE - lovenox ID - zosyn d4/5, but may need longer pending CT scan findings today Foley - in place by urology due  to inability to place on floor. Can DC today from surgical standpoint  SPCM - TNA Post op acute respiratory failure - resolved  Henreitta Cea 07/01/2022

## 2022-07-01 NOTE — Progress Notes (Signed)
PROGRESS NOTE    Gabriel Dalton  [id number] DOB: 1985-11-15 DOA: 06/24/2022 PCP: Marrian Salvage, FNP     Brief Narrative:  37 yo BM PMHx chronic abd pain, wt loss, bronchitis, multiple gastric ulcers, tobacco abuse, gallstones (for which he saw [facility] outpt 2/8 and chole was offered but not yet scheduled) presented to [facility] ED 2/13 with CC abd pain. followed by gastroenterology lumbar as outpatient and has had extensive workup in past 3 years for similar presentation-he has had extensive workup including multiple CT scans, MRI, ultrasound, NM gastric emptying study, most recent endoscopy evaluation in August 2023 revealing esophagitis with no bleeding, gastritis and biopsy with mild nonspecific reactive gastropathy, he has had positive occult blood 05/31/2022 which was attributed to hemorrhoids.  He endorses about 60 pound weight loss since the symptoms started.  He was recently seen by general surgery on 06/19/2022 to discuss cholecystectomy given that he has a gallstone.     CCS was consulted 2/13 and pt was posted for lap chole 2/14. Op course c/b  identification of numerous small bowel perforations and was converted to open surgery, with evacuation of 1L bowel contents.   PCCM was called ,remained intubated and transfer to ICU    Subjective: 2/20 A/O x 4, states abdominal pain 8/10.  Negative gas, negative BM   Assessment & Plan: Covid vaccination;   Principal Problem:   Cholelithiasis Active Problems:   Severe protein-calorie malnutrition ([place])   Small bowel perforation ([place])   Endotracheal tube present   Gallstones   Peritonitis ([place])   Endotracheally intubated  Sepsis due to feculent peritonitis -Normal saline 75l/hr -Secondary to bowel perforation.  Treat underlying condition -Continue current antibiotics for minimal 7 days. -2/20 given patient's continued significant abdominal pain surgery has ordered CT abdomen and pelvis pending  Small bowel perf  due to SBO s/p resection, reanastomosis -Care per surgery team. -See severe protein calorie malnutrition  Cholelithiasis -See small bowel perforation -Reinsert NG tube if patient begins to experience nausea/vomiting again.  Acute on chronic abd pain -2/19 Fentanyl patch 25 mcg/h every 3 days -2/19 continue Dilaudid for breakthrough pain will attempt to titrate down -2/19 RN will attempt to ambulate patient -2/20 if patient's severe abdominal pain continues overnight when fentanyl patch replaced increase to 50 mcg/h   Severe protein calorie malnutrition -Continue TPN until surgery clears for p.o.  Hypoglycemia  CBG (last 3)  Recent Labs    06/30/22 2334 07/01/22 0339 07/01/22 0758  GLUCAP 128* 120* 113*   -Patient now on TPN should not be a problem.   BPH -Foley placed by urology. - Once patient more mobile will remove voiding trial.    Anemia, of critical illness -Anemia panel pending - Transfuse for hemoglobin<7   Hyponatremia, mild 2/19 see sepsis   Hypokalemia - Potassium goal>4 -Potassium IV 40 mEq  Hypomagnesmia - Magnesium goal> 2 -2/10 on TPN, will monitor and add additional if required  Hypophosphatemia -Phosphorus goal> 2.5        Mobility Assessment (last 72 hours)     Mobility Assessment     Row Name 06/30/22 2052 06/30/22 1512 06/29/22 1537       Does patient have an order for bedrest or is patient medically unstable No - Continue assessment -- --     What is the highest level of mobility based on the progressive mobility assessment? Level 4 (Walks with assist in room) - Balance while marching in place and cannot step forward and back -  Complete Level 4 (Walks with assist in room) - Balance while marching in place and cannot step forward and back - Complete Level 5 (Walks with assist in room/hall) - Balance while stepping forward/back and can walk in room with assist - Complete                    DVT prophylaxis: Lovenox Code  Status: Full Family Communication:  Status is: Inpatient    Dispo: The patient is from: Home              Anticipated d/c is to: Home              Anticipated d/c date is: > 3 days              Patient currently is not medically stable to d/c.      Consultants:  PCCM [facility]   Procedures/Significant Events:  2/13 admit. [facility] consult for chole eval given known hx gallstones  2/14 intended lap chole --> ex lap with identification of small bowel perfs req resection + feculent peritonitis. To ICU post op intubated. Added zosyn Dr  Louanna Raw [facility] 2/15 remains intubated sedated w open abd  2/16 OR for washout and abdominal closure, adding D5 with hypoglycemia on low rate TPN Dr  Eddie Dibbles Stechschulte [facility] 2/17 extubated  I have personally reviewed and interpreted all radiology studies and my findings are as above.  VENTILATOR SETTINGS:    Cultures   Antimicrobials: Anti-infectives (From admission, onward)    Start     Ordered Stop   06/26/22 0000  piperacillin-tazobactam (ZOSYN) IVPB 3.375 g        06/25/22 1832     06/25/22 1815  piperacillin-tazobactam (ZOSYN) IVPB 3.375 g        06/25/22 1802 06/25/22 1830   06/25/22 1756  piperacillin-tazobactam (ZOSYN) 3.375 GM/50ML IVPB       Note to Pharmacy: Ward, Christa K: cabinet override   06/25/22 1756 06/27/22 0333   06/25/22 1630  ceFAZolin (ANCEF) IVPB 2g/100 mL premix        06/25/22 1534 06/25/22 1640         Devices    LINES / TUBES:      Continuous Infusions:  sodium chloride Stopped (06/27/22 0832)   sodium chloride 75 mL/hr at 07/01/22 0436   methocarbamol (ROBAXIN) IV 1,000 mg (07/01/22 0140)   methocarbamol (ROBAXIN) IV Stopped (06/28/22 2047)   piperacillin-tazobactam 3.375 g (07/01/22 0747)   TPN ADULT (ION) 65 mL/hr at 06/30/22 1702     Objective: Vitals:   06/30/22 1539 06/30/22 1731 06/30/22 2042 07/01/22 0621  BP: (!) 141/100 (!) 147/102 (!) 151/108 (!) 134/107  Pulse: 81 88 (!) 101 94   Resp: 16 16 18 18  $ Temp: (!) 97.5 F (36.4 C) 97.7 F (36.5 C) 99.2 F (37.3 C) 98.6 F (37 C)  TempSrc: Oral Oral Oral Oral  SpO2: 95% 100% 100% 100%  Weight:      Height:        Intake/Output Summary (Last 24 hours) at 07/01/2022 0804 Last data filed at 07/01/2022 0600 Gross per 24 hour  Intake 4611.76 ml  Output 4750 ml  Net -138.24 ml    Filed Weights   06/25/22 2040 06/25/22 2053 06/26/22 1045  Weight: 48.8 kg 49.8 kg 48.3 kg   Physical Exam:  General: A/O x 4, No acute respiratory distress Eyes: negative scleral hemorrhage, negative anisocoria, negative icterus ENT: Negative Runny nose, negative gingival bleeding, Neck:  Negative scars, masses, torticollis, lymphadenopathy, JVD Lungs: Clear to auscultation bilaterally without wheezes or crackles Cardiovascular: Regular rate and rhythm without murmur gallop or rub normal S1 and S2 Abdomen: Positive abdominal pain rated 8/10, positive distention, negative soft, bowel sounds, no rebound, no ascites, no appreciable mass Extremities: No significant cyanosis, clubbing, or edema bilateral lower extremities Skin: Ex lap covered in clean Psychiatric:  Negative depression, negative anxiety, negative fatigue, negative mania  Central nervous system:  Cranial nerves II through XII intact, tongue/uvula midline, all extremities muscle strength 5/5, sensation intact throughout, negative dysarthria, negative expressive aphasia, negative receptive aphasia.   .     Data Reviewed: Care during the described time interval was provided by me .  I have reviewed this patient's available data, including medical history, events of note, physical examination, and all test results as part of my evaluation.  CBC: Recent Labs  Lab 06/24/22 1400 06/25/22 2016 06/26/22 0538 06/27/22 0431 06/28/22 0323 06/29/22 0736 06/30/22 0342 07/01/22 0351  WBC 7.9   < > 16.1* 15.4* 20.7* 14.0* 15.1* 19.6*  NEUTROABS 5.2  --  13.9*  --   --   --   --   14.0*  HGB 11.7*   < > 11.2* 9.2* 8.7* 9.2* 10.2* 10.8*  HCT 32.8*   < > 32.6* 26.1* 25.5* 26.0* 28.6* 30.6*  MCV 83.0   < > 84.9 83.7 86.1 82.3 81.9 81.8  PLT 590*   < > 451* 380 398 373 406* 461*   < > = values in this interval not displayed.    Basic Metabolic Panel: Recent Labs  Lab 06/27/22 0431 06/28/22 0544 06/29/22 0736 06/30/22 0342 07/01/22 0351  NA 133* 132* 132* 131* 131*  K 3.8 4.0 3.4* 3.5 4.1  CL 101 96* 96* 94* 97*  CO2 24 27 28 26 24  $ GLUCOSE 110* 134* 124* 102* 106*  BUN 14 13 9 8 10  $ CREATININE 0.57* 0.57* 0.55* 0.52* 0.47*  CALCIUM 8.1* 8.2* 8.2* 8.2* 8.3*  MG 2.1 2.1 1.6* 1.9 2.0  PHOS 2.2* 2.9 1.8* 2.4* 2.8    GFR: Estimated Creatinine Clearance: 88 mL/min (A) (by C-G formula based on SCr of 0.47 mg/dL (L)). Liver Function Tests: Recent Labs  Lab 06/24/22 1400 06/26/22 0538 06/27/22 0431 06/30/22 0342 07/01/22 0351  AST 19 18 26 24 17  $ ALT 22 14 17 18 18  $ ALKPHOS 82 50 43 63 74  BILITOT 0.5 1.1 0.5 0.8 0.8  PROT 7.0 5.8* 4.9* 5.6* 5.9*  ALBUMIN 3.2* 2.7* 2.1* 2.3* 2.2*    Recent Labs  Lab 06/24/22 1400  LIPASE 30    No results for input(s): "AMMONIA" in the last 168 hours. Coagulation Profile: No results for input(s): "INR", "PROTIME" in the last 168 hours. Cardiac Enzymes: No results for input(s): "CKTOTAL", "CKMB", "CKMBINDEX", "TROPONINI" in the last 168 hours. BNP (last 3 results) No results for input(s): "PROBNP" in the last 8760 hours. HbA1C: No results for input(s): "HGBA1C" in the last 72 hours. CBG: Recent Labs  Lab 06/30/22 1607 06/30/22 2006 06/30/22 2334 07/01/22 0339 07/01/22 0758  GLUCAP 98 113* 128* 120* 113*    Lipid Profile: Recent Labs    06/30/22 0342  TRIG 361*    Thyroid Function Tests: No results for input(s): "TSH", "T4TOTAL", "FREET4", "T3FREE", "THYROIDAB" in the last 72 hours. Anemia Panel: Recent Labs    07/01/22 0351  VITAMINB12 962*  FOLATE 5.3*  FERRITIN 602*  TIBC 157*  IRON  17*  RETICCTPCT 0.4  Sepsis Labs: No results for input(s): "PROCALCITON", "LATICACIDVEN" in the last 168 hours.  Recent Results (from the past 240 hour(s))  MRSA Next Gen by PCR, Nasal     Status: None   Collection Time: 06/25/22 10:10 PM   Specimen: Nasal Mucosa; Nasal Swab  Result Value Ref Range Status   MRSA by PCR Next Gen NOT DETECTED NOT DETECTED Final    Comment: (NOTE) The GeneXpert MRSA Assay (FDA approved for NASAL specimens only), is one component of a comprehensive MRSA colonization surveillance program. It is not intended to diagnose MRSA infection nor to guide or monitor treatment for MRSA infections. Test performance is not FDA approved in patients less than 48 years old. Performed at [facility], [place] [address] [place], [place] [address]          Radiology Studies: No results found.      Scheduled Meds:  Chlorhexidine Gluconate Cloth  6 each Topical Q0600   enoxaparin (LOVENOX) injection  40 mg Subcutaneous Daily   fentaNYL  1 patch Transdermal Q72H   pantoprazole (PROTONIX) IV  40 mg Intravenous Q24H   simethicone  80 mg Oral QID   sodium chloride flush  10-40 mL Intracatheter Q12H   Continuous Infusions:  sodium chloride Stopped (06/27/22 0832)   sodium chloride 75 mL/hr at 07/01/22 0436   methocarbamol (ROBAXIN) IV 1,000 mg (07/01/22 0140)   methocarbamol (ROBAXIN) IV Stopped (06/28/22 2047)   piperacillin-tazobactam 3.375 g (07/01/22 0747)   TPN ADULT (ION) 65 mL/hr at 06/30/22 1702     LOS: 5 days    Time spent:40 min    Kyra Laffey, Geraldo Docker, MD [facility]   If 7PM-7AM, please contact night-coverage 07/01/2022, 8:04 AM

## 2022-07-02 DIAGNOSIS — R1010 Upper abdominal pain, unspecified: Secondary | ICD-10-CM | POA: Diagnosis not present

## 2022-07-02 LAB — COMPREHENSIVE METABOLIC PANEL
ALT: 16 U/L (ref 0–44)
AST: 15 U/L (ref 15–41)
Albumin: 2.1 g/dL — ABNORMAL LOW (ref 3.5–5.0)
Alkaline Phosphatase: 73 U/L (ref 38–126)
Anion gap: 9 (ref 5–15)
BUN: 11 mg/dL (ref 6–20)
CO2: 23 mmol/L (ref 22–32)
Calcium: 8.1 mg/dL — ABNORMAL LOW (ref 8.9–10.3)
Chloride: 98 mmol/L (ref 98–111)
Creatinine, Ser: 0.49 mg/dL — ABNORMAL LOW (ref 0.61–1.24)
GFR, Estimated: >60 mL/min (ref >60)
Glucose, Bld: 112 mg/dL — ABNORMAL HIGH (ref 70–99)
Potassium: 3.9 mmol/L (ref 3.5–5.1)
Sodium: 130 mmol/L — ABNORMAL LOW (ref 135–145)
Total Bilirubin: 1 mg/dL (ref 0.3–1.2)
Total Protein: 5.9 g/dL — ABNORMAL LOW (ref 6.5–8.1)

## 2022-07-02 LAB — CBC WITH DIFFERENTIAL/PLATELET
Abs Immature Granulocytes: 0.85 10*3/uL — ABNORMAL HIGH (ref 0.00–0.07)
Basophils Absolute: 0.1 10*3/uL (ref 0.0–0.1)
Basophils Relative: 0 %
Eosinophils Absolute: 0.2 10*3/uL (ref 0.0–0.5)
Eosinophils Relative: 1 %
HCT: 27.8 % — ABNORMAL LOW (ref 39.0–52.0)
Hemoglobin: 9.8 g/dL — ABNORMAL LOW (ref 13.0–17.0)
Immature Granulocytes: 4 %
Lymphocytes Relative: 13 %
Lymphs Abs: 2.6 10*3/uL (ref 0.7–4.0)
MCH: 28.7 pg (ref 26.0–34.0)
MCHC: 35.3 g/dL (ref 30.0–36.0)
MCV: 81.3 fL (ref 80.0–100.0)
Monocytes Absolute: 2.5 10*3/uL — ABNORMAL HIGH (ref 0.1–1.0)
Monocytes Relative: 12 %
Neutro Abs: 14.2 10*3/uL — ABNORMAL HIGH (ref 1.7–7.7)
Neutrophils Relative %: 70 %
Platelets: 491 10*3/uL — ABNORMAL HIGH (ref 150–400)
RBC: 3.42 MIL/uL — ABNORMAL LOW (ref 4.22–5.81)
RDW: 14.8 % (ref 11.5–15.5)
WBC: 20.3 10*3/uL — ABNORMAL HIGH (ref 4.0–10.5)
nRBC: 0 % (ref 0.0–0.2)

## 2022-07-02 LAB — MAGNESIUM: Magnesium: 1.8 mg/dL (ref 1.7–2.4)

## 2022-07-02 LAB — GLUCOSE, CAPILLARY
Glucose-Capillary: 122 mg/dL — ABNORMAL HIGH (ref 70–99)
Glucose-Capillary: 126 mg/dL — ABNORMAL HIGH (ref 70–99)
Glucose-Capillary: 104 mg/dL — ABNORMAL HIGH (ref 70–99)
Glucose-Capillary: 117 mg/dL — ABNORMAL HIGH (ref 70–99)

## 2022-07-02 LAB — PHOSPHORUS: Phosphorus: 3 mg/dL (ref 2.5–4.6)

## 2022-07-02 LAB — TRIGLYCERIDES: Triglycerides: 211 mg/dL — ABNORMAL HIGH (ref <150)

## 2022-07-02 MED ORDER — ENOXAPARIN SODIUM 40 MG/0.4ML IJ SOSY
40.0000 mg | PREFILLED_SYRINGE | Freq: Every day | INTRAMUSCULAR | Status: DC
  Administered 2022-07-04 – 2022-07-11 (×8): 40 mg via SUBCUTANEOUS
  Filled 2022-07-02 (×9): qty 0.4

## 2022-07-02 MED ORDER — MAGNESIUM SULFATE IN D5W 1-5 GM/100ML-% IV SOLN
1.0000 g | Freq: Once | INTRAVENOUS | Status: AC
  Administered 2022-07-02: 1 g via INTRAVENOUS
  Filled 2022-07-02: qty 100

## 2022-07-02 MED ORDER — TRAVASOL 10 % IV SOLN
INTRAVENOUS | Status: AC
  Filled 2022-07-02: qty 960

## 2022-07-02 NOTE — Progress Notes (Signed)
PROGRESS NOTE    Gabriel Dalton  [id number] DOB: [birth year] DOA: 06/24/2022 PCP: Marrian Salvage, FNP   Brief Narrative: 37 yo BM PMHx chronic abd pain, wt loss, bronchitis, multiple gastric ulcers, tobacco abuse, gallstones (for which he saw [facility] outpt 2/8 and chole was offered but not yet scheduled) presented to [facility] ED 2/13 with CC abd pain. followed by gastroenterology labouer as outpatient and has had extensive workup in past 3 years for similar presentation-he has had extensive workup including multiple CT scans, MRI, ultrasound, NM gastric emptying study, most recent endoscopy evaluation in August 2023 revealing esophagitis with no bleeding, gastritis and biopsy with mild nonspecific reactive gastropathy, he has had positive occult blood 05/31/2022 which was attributed to hemorrhoids.  He endorses about 60 pound weight loss since the symptoms started.  He was recently seen by general surgery on 06/19/2022 to discuss cholecystectomy given that he has a gallstone.     CCS was consulted 2/13 and pt was posted for lap chole 2/14. Op course c/b  identification of numerous small bowel perforations and was converted to open surgery, with evacuation of 1L bowel contents.  Assessment & Plan:   Principal Problem:   Cholelithiasis Active Problems:   Severe protein-calorie malnutrition ([place])   Small bowel perforation ([facility])   Endotracheal tube present   Gallstones   Peritonitis ([place])   Endotracheally intubated   #1 sepsis due to peritonitis from small bowel perforation-on Zosyn.  Still with leukocytosis 20.3 He still has not had a bowel movement or having flatus.  Continue NPO.  General surgery following.  Appreciate their input. CT abdomen 07/01/2022-suggestive of ileus in the setting of peritonitis.  Loculated left subdiaphragmatic fluid suspicious for abscess deforming the spleen.  Suspicious complicated postoperative fluid perihepatic.  Left greater than right pleural  effusion and body wall edema. Surgery consulted IR for placement of drain at the fluid collection near the spleen. Encourage incentive spirometry use  #2 small bowel perforation status postresection and reanastomosis  #3 BPH difficult Foley placement placed by urology Will DC today  #4 multiple electrolyte abnormalities including hyponatremia, hypokalemia, hypomagnesemia and hypophosphatemia-on TPN   Nutrition Problem: Severe Malnutrition Etiology: chronic illness   Signs/Symptoms: severe fat depletion, severe muscle depletion, percent weight loss (22% weight loss in 11 months) Percent weight loss: 22 % (in 11 months)    Interventions: Refer to RD note for recommendations, Boost Breeze, MVI  Estimated body mass index is 16.19 kg/m as calculated from the following:   Height as of this encounter: 5' 8"$  (1.727 m).   Weight as of this encounter: 48.3 kg.  DVT prophylaxis: Lovenox Code Status: Full code Family Communication: Wife at bedside  disposition Plan:  Status is: Inpatient Remains inpatient appropriate because: On TPN status post peritonitis   Consultants:  General surgery PCCM  Procedures: Ex lap 06/25/2022 Intubation Extubated 06/28/2022 06/27/2022 OR for washout and abdominal closure  Antimicrobials: Zosyn  Subjective: Awake resting in bed wife by the bedside reports he was sitting up earlier today Still complains of a lot of abdominal pain remains on TPN has not had a bowel movement or gas  Objective: Vitals:   07/01/22 1725 07/01/22 2011 07/02/22 0515 07/02/22 0900  BP: (!) 138/106 (!) 133/93 (!) 137/96 128/76  Pulse: (!) 109 (!) 109 (!) 102 94  Resp: 14 18 18   $ Temp: 98.6 F (37 C) 99 F (37.2 C) 98.7 F (37.1 C) 98.1 F (36.7 C)  TempSrc: Oral Oral Oral Oral  SpO2:  100% 100% 98% 100%  Weight:      Height:        Intake/Output Summary (Last 24 hours) at 07/02/2022 1036 Last data filed at 07/02/2022 [id number] Gross per 24 hour  Intake 3710.57 ml   Output 4000 ml  Net -289.43 ml   Filed Weights   06/25/22 2040 06/25/22 2053 06/26/22 1045  Weight: 48.8 kg 49.8 kg 48.3 kg    Examination: Foley in place  General exam: Appears in no acute distress  respiratory system: Diminished at the bases  cardiovascular system: S1 & S2 heard, RRR. No JVD, murmurs, rubs, gallops or clicks. No pedal edema. Gastrointestinal system: Abdomen is distended, soft and tender. No organomegaly or masses felt. Normal bowel sounds heard.  Covered with dressing Central nervous system: Alert and oriented. No focal neurological deficits. Extremities: No edema  skin: No rashes, lesions or ulcers Psychiatry: Judgement and insight appear normal. Mood & affect appropriate.     Data Reviewed: I have personally reviewed following labs and imaging studies  CBC: Recent Labs  Lab 06/26/22 0538 06/27/22 0431 06/28/22 0323 06/29/22 0736 06/30/22 0342 07/01/22 0351 07/02/22 0251  WBC 16.1*   < > 20.7* 14.0* 15.1* 19.6* 20.3*  NEUTROABS 13.9*  --   --   --   --  14.0* 14.2*  HGB 11.2*   < > 8.7* 9.2* 10.2* 10.8* 9.8*  HCT 32.6*   < > 25.5* 26.0* 28.6* 30.6* 27.8*  MCV 84.9   < > 86.1 82.3 81.9 81.8 81.3  PLT 451*   < > 398 373 406* 461* 491*   < > = values in this interval not displayed.   Basic Metabolic Panel: Recent Labs  Lab 06/28/22 0544 06/29/22 0736 06/30/22 0342 07/01/22 0351 07/02/22 0251  NA 132* 132* 131* 131* 130*  K 4.0 3.4* 3.5 4.1 3.9  CL 96* 96* 94* 97* 98  CO2 27 28 26 24 23  $ GLUCOSE 134* 124* 102* 106* 112*  BUN 13 9 8 10 11  $ CREATININE 0.57* 0.55* 0.52* 0.47* 0.49*  CALCIUM 8.2* 8.2* 8.2* 8.3* 8.1*  MG 2.1 1.6* 1.9 2.0 1.8  PHOS 2.9 1.8* 2.4* 2.8 3.0   GFR: Estimated Creatinine Clearance: 88 mL/min (A) (by C-G formula based on SCr of 0.49 mg/dL (L)). Liver Function Tests: Recent Labs  Lab 06/26/22 0538 06/27/22 0431 06/30/22 0342 07/01/22 0351 07/02/22 0251  AST 18 26 24 17 15  $ ALT 14 17 18 18 16  $ ALKPHOS 50 43 63  74 73  BILITOT 1.1 0.5 0.8 0.8 1.0  PROT 5.8* 4.9* 5.6* 5.9* 5.9*  ALBUMIN 2.7* 2.1* 2.3* 2.2* 2.1*   No results for input(s): "LIPASE", "AMYLASE" in the last 168 hours. No results for input(s): "AMMONIA" in the last 168 hours. Coagulation Profile: No results for input(s): "INR", "PROTIME" in the last 168 hours. Cardiac Enzymes: No results for input(s): "CKTOTAL", "CKMB", "CKMBINDEX", "TROPONINI" in the last 168 hours. BNP (last 3 results) No results for input(s): "PROBNP" in the last 8760 hours. HbA1C: No results for input(s): "HGBA1C" in the last 72 hours. CBG: Recent Labs  Lab 07/01/22 1621 07/01/22 2006 07/01/22 2314 07/02/22 0404 07/02/22 0715  GLUCAP 101* 120* 121* 122* 126*   Lipid Profile: Recent Labs    06/30/22 0342 07/02/22 0251  TRIG 361* 211*   Thyroid Function Tests: No results for input(s): "TSH", "T4TOTAL", "FREET4", "T3FREE", "THYROIDAB" in the last 72 hours. Anemia Panel: Recent Labs    07/01/22 0351  VITAMINB12 962*  FOLATE  5.3*  FERRITIN 602*  TIBC 157*  IRON 17*  RETICCTPCT 0.4   Sepsis Labs: No results for input(s): "PROCALCITON", "LATICACIDVEN" in the last 168 hours.  Recent Results (from the past 240 hour(s))  MRSA Next Gen by PCR, Nasal     Status: None   Collection Time: 06/25/22 10:10 PM   Specimen: Nasal Mucosa; Nasal Swab  Result Value Ref Range Status   MRSA by PCR Next Gen NOT DETECTED NOT DETECTED Final    Comment: (NOTE) The GeneXpert MRSA Assay (FDA approved for NASAL specimens only), is one component of a comprehensive MRSA colonization surveillance program. It is not intended to diagnose MRSA infection nor to guide or monitor treatment for MRSA infections. Test performance is not FDA approved in patients less than 58 years old. Performed at [facility], [place] [address], [place], [place] [address]          Radiology Studies: CT ABDOMEN PELVIS W CONTRAST  Result Date: 07/01/2022 CLINICAL DATA:   Acute abdominal pain, postop for small-bowel resection with small bowel perforation and feculent peritonitis. EXAM: CT ABDOMEN AND PELVIS WITH CONTRAST TECHNIQUE: Multidetector CT imaging of the abdomen and pelvis was performed using the standard protocol following bolus administration of intravenous contrast. RADIATION DOSE REDUCTION: This exam was performed according to the departmental dose-optimization program which includes automated exposure control, adjustment of the mA and/or kV according to patient size and/or use of iterative reconstruction technique. CONTRAST:  150m OMNIPAQUE IOHEXOL 300 MG/ML  SOLN COMPARISON:  May 20, 2022 FINDINGS: Lower chest: Moderate LEFT and small RIGHT pleural effusions. Hepatobiliary: Suggestion of background steatosis of the liver. Portal vein is patent. No pericholecystic stranding or sign of biliary duct dilation. Perihepatic fluid with some peritoneal enhancement w and small locules of gas/pneumoperitoneum. Fluid tracks along the anterior abdomen adjacent to small bowel loops, for instance on image 53/2 an area measuring 6 x 1.1 cm is noted. Loculated fluid in the LEFT subdiaphragmatic region, see below. Pancreas: Pancreas is normal. Spleen: Spleen with compression of parenchyma due to loculated LEFT subdiaphragmatic fluid that tracks along the gastrosplenic ligament and beneath the LEFT hemidiaphragm. This fluid measures 8.5 x 4.3 cm (image 21/2) Adrenals/Urinary Tract: Adrenal glands are normal. Symmetric renal enhancement without hydronephrosis or perinephric stranding. Foley catheter decompresses the urinary bladder which is not well assessed. No suspicious renal lesion. Stomach/Bowel: Distortion of the stomach due to loculated LEFT subdiaphragmatic fluid adjacent to spleen and stomach. Contrast passes beyond the stomach into the small bowel becomes more dilute is it passes distally. Signs of small bowel resection in the RIGHT lower quadrant. Small bowel is dilated  to this level. Colon becomes gradually decompressed along the descending colon and is moderately dilated as well and filled with gas. Small bowel dilation is intermittent. Contrast passes the level of the anastomotic site in the RIGHT lower quadrant becoming more dilute is it passes into the ileum. No discrete transition point is identified. There is a slight changing caliber just beyond the anastomosis but bowel loops show mild distension to the level of the terminal ileum. No definitive signs of leak.  No gas near the anastomotic site. No focal fluid near the anastomotic site. Vascular/Lymphatic: No aneurysmal dilation of the abdominal aorta. Calcified aortic atherosclerotic changes. No adenopathy in the abdomen or in the pelvis. Reproductive: Scrotal in generalized edema throughout the body wall likely reflecting anasarca or hypoproteinemia. Otherwise unremarkable to the extent evaluated by CT. Other: Loculated appearing fluid about the liver and in  particular in the LEFT subdiaphragmatic space. Small locules of gas within this fluid in this recent postoperative patient. Peritoneal enhancement. Trace stranding and thickening along the midline surgical changes as well. Signs of fluid in the pelvis, best seen on image 55/4 measuring approximately 4.8 x 1.0 cm in the coronal plane adjacent to small bowel loops in the pelvis Musculoskeletal: Body wall edema. Signs of cachexia. No acute bone finding or destructive bone process. IMPRESSION: 1. Signs of small bowel resection in the RIGHT lower quadrant. No definitive signs of leak at this time such is gas or extraluminal contrast adjacent to the small bowel anastomosis. 2. Findings more suggestive of ileus in the setting of peritonitis. Partial small bowel obstruction in the RIGHT lower quadrant is another differential consideration, follow-up will be helpful. 3. Loculated LEFT subdiaphragmatic fluid suspicious for abscess deforming the spleen. 4. Small volume  pneumoperitoneum with small locules of gas within perihepatic fluid also suspicious for complicated postoperative fluid though without as well-defined margins but associated with peritoneal enhancement. 5. Small fluid in the pelvis adjacent to small bowel loops barely discernible in the axial plane seen best in the coronal plane in the LEFT hemipelvis. 6. Signs of anasarca with LEFT greater than RIGHT pleural effusions and body wall edema. Aortic Atherosclerosis (ICD10-I70.0). Electronically Signed   By: Zetta Bills M.D.   On: 07/01/2022 16:25        Scheduled Meds:  Chlorhexidine Gluconate Cloth  6 each Topical Q0600   enoxaparin (LOVENOX) injection  40 mg Subcutaneous Daily   fentaNYL  1 patch Transdermal Q72H   pantoprazole (PROTONIX) IV  40 mg Intravenous Q24H   simethicone  80 mg Oral QID   sodium chloride flush  10-40 mL Intracatheter Q12H   Continuous Infusions:  sodium chloride Stopped (06/27/22 0832)   sodium chloride 60 mL/hr at 07/02/22 0012   methocarbamol (ROBAXIN) IV 1,000 mg (07/02/22 0911)   methocarbamol (ROBAXIN) IV Stopped (06/28/22 2047)   piperacillin-tazobactam 3.375 g (07/02/22 0808)   TPN ADULT (ION) 80 mL/hr at 07/01/22 1725   TPN ADULT (ION)       LOS: 6 days    Time spent: 39 min  Georgette Shell, MD  07/02/2022, 10:36 AM

## 2022-07-02 NOTE — Progress Notes (Addendum)
PHARMACY - TOTAL PARENTERAL NUTRITION CONSULT NOTE   Indication: malnutrition, bowel perforation, expected prolonged ileus  Patient Measurements: Height: 5' 8"$  (172.7 cm) Weight: 48.3 kg (106 lb 7.7 oz) IBW/kg (Calculated) : 68.4 TPN AdjBW (KG): 49.8 Body mass index is 16.19 kg/m.  Assessment: 79 yoM admitted with abdominal pain, weight loss.  Underwent laparoscopic cholecystectomy on 2/14 and was found with multiple bowel perforations and stool contamination of abdomen, converted to exploratory laparotomy, with small bowel resection and reanastomosis, and temporary abdominal closure.  TPN started on 06/25/22.    Glucose / Insulin: No noted hx DM.  A1c 5.8. Goal BG <150 - Hypoglycemia 2/17 ~4am with CBG 28 (despite TPN and D5 infusions).   - No further hypoglycemia episodes.  CBGs: 101-126 Electrolytes:  - Na remains slightly low at 130 with max conc Na in TPN; Mag 1.8 - other lytes wnl including CorrCa Renal: SCr < 1 - Difficult urethral catheterization performed by urology on 2/14 Hepatic: LFTs wnl - Albumin low - TG: 361 (2/19), 211 (2/21) - Propofol off 2/15  Intake / Output; MIVF: NS @60$  ml/hr  - I/O: - 393 mL  - good UOP GI Imaging: - 2/20 Abd CT: Findings more suggestive of ileus in the setting of peritonitis. Loculated LEFT subdiaphragmatic fluid suspicious for abscess deforming the spleen. GI Surgeries / Procedures:  2/14 OR: small bowel resection and reanastomosis, open abdomen 2/16 OR: exp lap, washout and abdominal closure   Central access: Double lumen PICC placed 2/15 TPN start date: 2/15  Nutritional Goals: Goal TPN rate is 80 mL/hr to provide 96 g of protein and 2039 kcals per day.  - Thiamine 1428m in TPN x5 days (2/15 - 2/19)  RD Assessment: Estimated Needs Total Energy Estimated Needs: 1950-2100 kcals Total Protein Estimated Needs: 90-100 grams Total Fluid Estimated Needs: >/= 1.9L  Current Nutrition:  NPO TPN   Plan:   Now:  - magnesium  sulfate 1gm IV x1  At 1800:  Continue TPN at goal rate of 80 mL/hr Provides 96 g protein, 2039 kcal, 345 g dextrose   Electrolytes in TPN:  Na 150 mEq/L K 50 mEq/L Ca 572m/L Mg 28m48mL Phos 20 mmol/L.  Cl:Ac max Cl  Add standard MVI and trace elements to TPN SSI d/c by MD.  Change CBG checks to q8h. If CBGs remain stable, will plan on d/cing cbg checks on 07/03/22 mIVF: NS at 60 ml/hr Monitor TPN labs on Mon/Thurs.  MD ordered CMET daily thru 2/24; phos and mag daily   AnhDia SitterharmD, BCPS 07/02/2022 7:38 AM

## 2022-07-02 NOTE — Progress Notes (Signed)
5 Days Post-Op   Subjective/Chief Complaint: Still with pain today.  Some gas pain.  Some pain in his flanks, likely referred from diaphragm irritation secondary to fluid collections noted on imaging.  No nausea, but no bowel function yet.   Objective: Vital signs in last 24 hours: Temp:  [98.1 F (36.7 C)-99 F (37.2 C)] 98.1 F (36.7 C) (02/21 0900) Pulse Rate:  [94-109] 94 (02/21 0900) Resp:  [14-18] 18 (02/21 0515) BP: (128-140)/(76-106) 128/76 (02/21 0900) SpO2:  [98 %-100 %] 100 % (02/21 0900) Last BM Date :  (Pt cant remember, before admission)  Intake/Output from previous day: 02/20 0701 - 02/21 0700 In: 4007.1 [P.O.:120; I.V.:3107.3; IV Piggyback:779.8] Out: 4400 [Urine:4400] Intake/Output this shift: Total I/O In: 30 [P.O.:30] Out: -   PE: Heart: regular Lungs: CTAB Abd: soft, approp tender, wound clean, ND GU: foley in place  Lab Results:  Recent Labs    07/01/22 0351 07/02/22 0251  WBC 19.6* 20.3*  HGB 10.8* 9.8*  HCT 30.6* 27.8*  PLT 461* 491*   BMET Recent Labs    07/01/22 0351 07/02/22 0251  NA 131* 130*  K 4.1 3.9  CL 97* 98  CO2 24 23  GLUCOSE 106* 112*  BUN 10 11  CREATININE 0.47* 0.49*  CALCIUM 8.3* 8.1*   PT/INR No results for input(s): "LABPROT", "INR" in the last 72 hours. ABG No results for input(s): "PHART", "HCO3" in the last 72 hours.  Invalid input(s): "PCO2", "PO2"   Studies/Results: CT ABDOMEN PELVIS W CONTRAST  Result Date: 07/01/2022 CLINICAL DATA:  Acute abdominal pain, postop for small-bowel resection with small bowel perforation and feculent peritonitis. EXAM: CT ABDOMEN AND PELVIS WITH CONTRAST TECHNIQUE: Multidetector CT imaging of the abdomen and pelvis was performed using the standard protocol following bolus administration of intravenous contrast. RADIATION DOSE REDUCTION: This exam was performed according to the departmental dose-optimization program which includes automated exposure control, adjustment of the  mA and/or kV according to patient size and/or use of iterative reconstruction technique. CONTRAST:  127m OMNIPAQUE IOHEXOL 300 MG/ML  SOLN COMPARISON:  May 20, 2022 FINDINGS: Lower chest: Moderate LEFT and small RIGHT pleural effusions. Hepatobiliary: Suggestion of background steatosis of the liver. Portal vein is patent. No pericholecystic stranding or sign of biliary duct dilation. Perihepatic fluid with some peritoneal enhancement w and small locules of gas/pneumoperitoneum. Fluid tracks along the anterior abdomen adjacent to small bowel loops, for instance on image 53/2 an area measuring 6 x 1.1 cm is noted. Loculated fluid in the LEFT subdiaphragmatic region, see below. Pancreas: Pancreas is normal. Spleen: Spleen with compression of parenchyma due to loculated LEFT subdiaphragmatic fluid that tracks along the gastrosplenic ligament and beneath the LEFT hemidiaphragm. This fluid measures 8.5 x 4.3 cm (image 21/2) Adrenals/Urinary Tract: Adrenal glands are normal. Symmetric renal enhancement without hydronephrosis or perinephric stranding. Foley catheter decompresses the urinary bladder which is not well assessed. No suspicious renal lesion. Stomach/Bowel: Distortion of the stomach due to loculated LEFT subdiaphragmatic fluid adjacent to spleen and stomach. Contrast passes beyond the stomach into the small bowel becomes more dilute is it passes distally. Signs of small bowel resection in the RIGHT lower quadrant. Small bowel is dilated to this level. Colon becomes gradually decompressed along the descending colon and is moderately dilated as well and filled with gas. Small bowel dilation is intermittent. Contrast passes the level of the anastomotic site in the RIGHT lower quadrant becoming more dilute is it passes into the ileum. No discrete transition point is  identified. There is a slight changing caliber just beyond the anastomosis but bowel loops show mild distension to the level of the terminal ileum.  No definitive signs of leak.  No gas near the anastomotic site. No focal fluid near the anastomotic site. Vascular/Lymphatic: No aneurysmal dilation of the abdominal aorta. Calcified aortic atherosclerotic changes. No adenopathy in the abdomen or in the pelvis. Reproductive: Scrotal in generalized edema throughout the body wall likely reflecting anasarca or hypoproteinemia. Otherwise unremarkable to the extent evaluated by CT. Other: Loculated appearing fluid about the liver and in particular in the LEFT subdiaphragmatic space. Small locules of gas within this fluid in this recent postoperative patient. Peritoneal enhancement. Trace stranding and thickening along the midline surgical changes as well. Signs of fluid in the pelvis, best seen on image 55/4 measuring approximately 4.8 x 1.0 cm in the coronal plane adjacent to small bowel loops in the pelvis Musculoskeletal: Body wall edema. Signs of cachexia. No acute bone finding or destructive bone process. IMPRESSION: 1. Signs of small bowel resection in the RIGHT lower quadrant. No definitive signs of leak at this time such is gas or extraluminal contrast adjacent to the small bowel anastomosis. 2. Findings more suggestive of ileus in the setting of peritonitis. Partial small bowel obstruction in the RIGHT lower quadrant is another differential consideration, follow-up will be helpful. 3. Loculated LEFT subdiaphragmatic fluid suspicious for abscess deforming the spleen. 4. Small volume pneumoperitoneum with small locules of gas within perihepatic fluid also suspicious for complicated postoperative fluid though without as well-defined margins but associated with peritoneal enhancement. 5. Small fluid in the pelvis adjacent to small bowel loops barely discernible in the axial plane seen best in the coronal plane in the LEFT hemipelvis. 6. Signs of anasarca with LEFT greater than RIGHT pleural effusions and body wall edema. Aortic Atherosclerosis (ICD10-I70.0).  Electronically Signed   By: Zetta Bills M.D.   On: 07/01/2022 16:25    Anti-infectives: Anti-infectives (From admission, onward)    Start     Dose/Rate Route Frequency Ordered Stop   06/26/22 0000  piperacillin-tazobactam (ZOSYN) IVPB 3.375 g        3.375 g 12.5 mL/hr over 240 Minutes Intravenous Every 8 hours 06/25/22 1832     06/25/22 1815  piperacillin-tazobactam (ZOSYN) IVPB 3.375 g        3.375 g 100 mL/hr over 30 Minutes Intravenous  Once 06/25/22 1802 06/25/22 1830   06/25/22 1756  piperacillin-tazobactam (ZOSYN) 3.375 GM/50ML IVPB       Note to Pharmacy: Ward, Christa K: cabinet override      06/25/22 1756 06/27/22 0333   06/25/22 1630  ceFAZolin (ANCEF) IVPB 2g/100 mL premix        2 g 200 mL/hr over 30 Minutes Intravenous  Once 06/25/22 1534 06/25/22 1640       Assessment/Plan: POD 7/5 s/p ex lap with SBR and washout for feculent peritonitis for SB perforation from SBO by Dr. Thermon Leyland  06/25/22 -feculent peritonitis found in OR with 2 SB perforations noted from obstructive process.  Unclear etiology. Mother had something similar and wife states sister is having some GI issues, ? Need for further work up to rule out [employer] or other etiology -Will wait for bowel function to advance diet and do po meds, but allow a few sips of clears from the floor to moisten mouth -cont zosyn for now as his scan from yesterday has been reviewed and revealed a fluid collection compressing on his spleen.  Small amount  of fluid near his liver, but minimal. -IR to see for possible perc drain of perisplenic collection. -continue bid dressing changes -will continue ofirmev, scheduled robaxin and do dilaudid for pain control -oob, pulm toilet -pathology on small bowel benign with significant luminal narrowing at one point -mobilize, pulm toilet   FEN - NPO /IVFs per primary/TPN VTE - lovenox ID - zosyn 2/13 --> Foley - in place by urology due to inability to place on floor. Can DC  today from surgical standpoint.  D/w primary service and removing it today.  SPCM - TNA Post op acute respiratory failure - resolved  Henreitta Cea 07/02/2022

## 2022-07-02 NOTE — Progress Notes (Signed)
Patient  arrived to ED on 06/24/22 with complaints of abdominal pain and significant weight loss. Patient was admitted on 02/13 for cholecystectomy due to gallstones and was found to have bowel perforations. Patient is alert and oriented x4. Normal capillary refill less than 3 seconds. Pupils equal reactive to light and accomodation. Patient stated pain with his abdomen and reported a pain of 7. No wheezing or crackles noted on auscultation. No heart murmur noted. Patients hand grip strength normal. Patient had some equal weakness on dorsiflexion of feet. Patient had normal strength on flexion of feet. Skin intact and dry no lesions noted.  Bowel sounds active and present . Patient denied PT this morning . He stated he wanted to wait until the afternoon. Patient complained of some numbness of his legs . Patient currently still NPO . Foley catheter was removed this morning.   August Saucer, Student RN

## 2022-07-02 NOTE — Progress Notes (Addendum)
PT Cancellation Note  Patient Details Name: Gabriel Dalton MRN: [id number] DOB: June 23, 1985   Cancelled Treatment:    Reason Eval/Treat Not Completed: Other (comment) Pt declined therapy this morning due to wanting to rest.  Checked in afternoon and pt in pain and reports just returned to bed.  States he has been up multiple times.  Family confirmed.  Will f/u as able at later date.  Continue plan of care - family /pt still interested in HHPT at d/c as pt is deconditioned and weak.   Abran Richard, PT Acute Rehab [facility] [place] 07/02/2022, 4:12 PM

## 2022-07-02 NOTE — TOC Progression Note (Signed)
Transition of Care [facility]) - Progression Note   Patient Details  Name: Octave Woodrome MRN: [id number] Date of Birth: 11/26/1985  Transition of Care [facility]) CM/SW [place], LCSW Phone Number: 07/02/2022, 1:41 PM  Clinical Narrative: TOC awaiting to see if HHPT is still being recommended.    Barriers to Discharge: Continued Medical Work up  Social Determinants of Health (SDOH) Interventions SDOH Screenings   Depression (PHQ2-9): High Risk (04/17/2022)  Tobacco Use: High Risk (06/29/2022)   Readmission Risk Interventions     No data to display

## 2022-07-02 NOTE — Consult Note (Cosign Needed Addendum)
Chief Complaint: Patient was seen in consultation today for CT guided aspiration/drainage of left subphrenic fluid collection  Chief Complaint  Patient presents with   Abdominal Pain     Referring Physician(s): Byerly,F  Supervising Physician: Arne Cleveland  Patient Status: [facility] - In-pt  History of Present Illness: Gabriel Dalton is a 37 y.o. male with past medical history significant for chronic abdominal pain, weight loss, bronchitis, multiple gastric ulcers, tobacco abuse, gallstones who presented to [facility] on 3/13 with abdominal pain.  She has been followed by [facility] as an outpatient and has had extensive workup in the past 3 years for similar presentation.  Endoscopy in August of last year revealed esophagitis with no bleeding, gastritis and biopsy with nonspecific reactive gastropathy.  He was recently seen by surgery on 2/8 to discuss cholecystectomy secondary to gallstones. On 2/14 patient underwent diagnostic laparoscopy which was converted to exploratory laparotomy with small bowel resection of the anastomosis and temporary abdominal closure for perforated small intestine/feculent peritonitis.  He underwent exploratory lap with washout and abdominal closure on 06/27/2022.  Secondary to persistent abdominal pain patient underwent follow-up CT abdomen pelvis yesterday which revealed:   1. Signs of small bowel resection in the RIGHT lower quadrant. No definitive signs of leak at this time such is gas or extraluminal contrast adjacent to the small bowel anastomosis. 2. Findings more suggestive of ileus in the setting of peritonitis. Partial small bowel obstruction in the RIGHT lower quadrant is another differential consideration, follow-up will be helpful. 3. Loculated LEFT subdiaphragmatic fluid suspicious for abscess deforming the spleen. 4. Small volume pneumoperitoneum with small locules of gas within perihepatic fluid also suspicious for  complicated postoperative fluid though without as well-defined margins but associated with peritoneal enhancement. 5. Small fluid in the pelvis adjacent to small bowel loops barely discernible in the axial plane seen best in the coronal plane in the LEFT hemipelvis. 6. Signs of anasarca with LEFT greater than RIGHT pleural effusions and body wall edema.   He is afebrile, WBC 20.3, hemoglobin 9.8, platelets 491k, creatinine 0.49, PT/INR pend; request now received from surgery team for image guided drainage of left subphrenic fluid collection.     Past Medical History:  Diagnosis Date   Bronchitis    Multiple gastric ulcers     Past Surgical History:  Procedure Laterality Date   CHOLECYSTECTOMY N/A 06/25/2022   Procedure: LAPAROSCOPIC CHOLECYSTECTOMY;  Surgeon: Felicie Morn, MD;  Location: [facility];  Service: General;  Laterality: N/A;   COLONOSCOPY     I & D EXTREMITY  10/18/2011   Procedure: IRRIGATION AND DEBRIDEMENT EXTREMITY;  Surgeon: Roseanne Kaufman, MD;  Location: [facility];  Service: Orthopedics;  Laterality: Right;  Irrigation and Debridement  Right Middle Finger; Removal of foreign body   LAPAROTOMY N/A 06/27/2022   Procedure: EXPLORATORY LAPAROTOMY, [place] OUT,  ABDOMINAL CLOSURE;  Surgeon: Felicie Morn, MD;  Location: [facility];  Service: General;  Laterality: N/A;    Allergies: Aspirin  Medications: Prior to Admission medications   Medication Sig Start Date End Date Taking? Authorizing Provider  acetaminophen (TYLENOL) 500 MG tablet Take 1,000 mg by mouth as needed for moderate pain.   Yes [provider]  ciprofloxacin (CIPRO) 500 MG tablet Take 500 mg by mouth 2 (two) times daily. 06/20/22  Yes [provider]  dicyclomine (BENTYL) 20 MG tablet Take 1 tablet (20 mg total) by mouth 2 (two) times daily. Patient taking differently: Take 20 mg by mouth  daily as needed for spasms. 05/28/22  Yes Zehr, Laban Emperor, PA-C  linaclotide (LINZESS) 145 MCG  CAPS capsule Take 1 capsule (145 mcg total) by mouth daily before breakfast. Patient taking differently: Take 145 mcg by mouth as needed (constipation). 04/23/22  Yes Zehr, Laban Emperor, PA-C  pantoprazole (PROTONIX) 40 MG tablet Take 1 tablet (40 mg total) by mouth 2 (two) times daily. 04/23/22  Yes Zehr, Laban Emperor, PA-C  sucralfate (CARAFATE) 1 g tablet Take 1 tablet (1 g total) by mouth 4 (four) times daily -  with meals and at bedtime. Patient taking differently: Take 1 g by mouth as needed (heartburn). 05/28/22  Yes Zehr, Laban Emperor, PA-C  tamsulosin (FLOMAX) 0.4 MG CAPS capsule Take 0.4 mg by mouth daily. 06/20/22  Yes [provider]  ketorolac (TORADOL) 10 MG tablet Take 10 mg by mouth every 6 (six) hours as needed. Patient not taking: Reported on 06/25/2022 06/20/22   [provider]  oxyCODONE-acetaminophen (PERCOCET/ROXICET) 5-325 MG tablet Take 1 tablet by mouth every 6 (six) hours as needed for severe pain. Patient not taking: Reported on 06/25/2022 05/18/22   Rex Kras, PA  promethazine (PHENERGAN) 25 MG tablet Take 1 tablet (25 mg total) by mouth every 6 (six) hours as needed for nausea or vomiting. Patient not taking: Reported on 06/25/2022 05/18/22   Rex Kras, PA     Family History  Problem Relation Age of Onset   Healthy Mother    Hypertension Father    Colon cancer Maternal Uncle    Rectal cancer Maternal Uncle    Brain cancer Maternal Uncle    Heart disease Maternal Aunt    Esophageal cancer Neg Hx    Stomach cancer Neg Hx     Social History   Socioeconomic History   Marital status: Married    Spouse name: Not on file   Number of children: 0   Years of education: Not on file   Highest education level: Not on file  Occupational History   Occupation: Truck driver  Tobacco Use   Smoking status: Every Day    Packs/day: 1.00    Types: Cigarettes   Smokeless tobacco: Never  Vaping Use   Vaping Use: Former  Substance and Sexual Activity   Alcohol use: Not  Currently   Drug use: Not Currently    Frequency: 3.0 times per week   Sexual activity: Yes    Birth control/protection: None  Other Topics Concern   Not on file  Social History Narrative   Not on file   Social Determinants of Health   Financial Resource Strain: Not on file  Food Insecurity: Not on file  Transportation Needs: Not on file  Physical Activity: Not on file  Stress: Not on file  Social Connections: Not on file      Review of Systems denies fever, headache, chest pain, worsening dyspnea, cough, nausea, vomiting or bleeding.  He does have abdominal and back discomfort  Vital Signs: BP 128/76 (BP Location: Left Arm)   Pulse 94   Temp 98.1 F (36.7 C) (Oral)   Resp 18   Ht 5' 8"$  (1.727 m)   Wt 106 lb 7.7 oz (48.3 kg)   SpO2 100%   BMI 16.19 kg/m     Physical Exam patient awake, alert.  Chest with diminished breath sounds bases.  Heart with regular rate and rhythm.  Abdomen soft, diffusely tender to palpation, midline wound covered with bandage; no lower extremity edema.  Imaging: CT ABDOMEN  PELVIS W CONTRAST  Result Date: 07/01/2022 CLINICAL DATA:  Acute abdominal pain, postop for small-bowel resection with small bowel perforation and feculent peritonitis. EXAM: CT ABDOMEN AND PELVIS WITH CONTRAST TECHNIQUE: Multidetector CT imaging of the abdomen and pelvis was performed using the standard protocol following bolus administration of intravenous contrast. RADIATION DOSE REDUCTION: This exam was performed according to the departmental dose-optimization program which includes automated exposure control, adjustment of the mA and/or kV according to patient size and/or use of iterative reconstruction technique. CONTRAST:  187m OMNIPAQUE IOHEXOL 300 MG/ML  SOLN COMPARISON:  May 20, 2022 FINDINGS: Lower chest: Moderate LEFT and small RIGHT pleural effusions. Hepatobiliary: Suggestion of background steatosis of the liver. Portal vein is patent. No pericholecystic  stranding or sign of biliary duct dilation. Perihepatic fluid with some peritoneal enhancement w and small locules of gas/pneumoperitoneum. Fluid tracks along the anterior abdomen adjacent to small bowel loops, for instance on image 53/2 an area measuring 6 x 1.1 cm is noted. Loculated fluid in the LEFT subdiaphragmatic region, see below. Pancreas: Pancreas is normal. Spleen: Spleen with compression of parenchyma due to loculated LEFT subdiaphragmatic fluid that tracks along the gastrosplenic ligament and beneath the LEFT hemidiaphragm. This fluid measures 8.5 x 4.3 cm (image 21/2) Adrenals/Urinary Tract: Adrenal glands are normal. Symmetric renal enhancement without hydronephrosis or perinephric stranding. Foley catheter decompresses the urinary bladder which is not well assessed. No suspicious renal lesion. Stomach/Bowel: Distortion of the stomach due to loculated LEFT subdiaphragmatic fluid adjacent to spleen and stomach. Contrast passes beyond the stomach into the small bowel becomes more dilute is it passes distally. Signs of small bowel resection in the RIGHT lower quadrant. Small bowel is dilated to this level. Colon becomes gradually decompressed along the descending colon and is moderately dilated as well and filled with gas. Small bowel dilation is intermittent. Contrast passes the level of the anastomotic site in the RIGHT lower quadrant becoming more dilute is it passes into the ileum. No discrete transition point is identified. There is a slight changing caliber just beyond the anastomosis but bowel loops show mild distension to the level of the terminal ileum. No definitive signs of leak.  No gas near the anastomotic site. No focal fluid near the anastomotic site. Vascular/Lymphatic: No aneurysmal dilation of the abdominal aorta. Calcified aortic atherosclerotic changes. No adenopathy in the abdomen or in the pelvis. Reproductive: Scrotal in generalized edema throughout the body wall likely reflecting  anasarca or hypoproteinemia. Otherwise unremarkable to the extent evaluated by CT. Other: Loculated appearing fluid about the liver and in particular in the LEFT subdiaphragmatic space. Small locules of gas within this fluid in this recent postoperative patient. Peritoneal enhancement. Trace stranding and thickening along the midline surgical changes as well. Signs of fluid in the pelvis, best seen on image 55/4 measuring approximately 4.8 x 1.0 cm in the coronal plane adjacent to small bowel loops in the pelvis Musculoskeletal: Body wall edema. Signs of cachexia. No acute bone finding or destructive bone process. IMPRESSION: 1. Signs of small bowel resection in the RIGHT lower quadrant. No definitive signs of leak at this time such is gas or extraluminal contrast adjacent to the small bowel anastomosis. 2. Findings more suggestive of ileus in the setting of peritonitis. Partial small bowel obstruction in the RIGHT lower quadrant is another differential consideration, follow-up will be helpful. 3. Loculated LEFT subdiaphragmatic fluid suspicious for abscess deforming the spleen. 4. Small volume pneumoperitoneum with small locules of gas within perihepatic fluid also suspicious for complicated  postoperative fluid though without as well-defined margins but associated with peritoneal enhancement. 5. Small fluid in the pelvis adjacent to small bowel loops barely discernible in the axial plane seen best in the coronal plane in the LEFT hemipelvis. 6. Signs of anasarca with LEFT greater than RIGHT pleural effusions and body wall edema. Aortic Atherosclerosis (ICD10-I70.0). Electronically Signed   By: Zetta Bills M.D.   On: 07/01/2022 16:25   DG Abd 1 View  Result Date: 06/28/2022 CLINICAL DATA:  NG tube placement. EXAM: ABDOMEN - 1 VIEW COMPARISON:  CT, 05/20/2022. FINDINGS: Nasogastric tube passes well below the diaphragm, tip in the mid stomach. Dilated small bowel loops are noted in the visualized central  abdomen. There is opacity in the medial left upper lobe, that appears to of progressed when compared to the chest radiograph dated 06/25/2022. This may reflect atelectasis. IMPRESSION: 1. Well-positioned nasal/orogastric tube. 2. Dilated small bowel loops consistent with a partial small bowel obstruction. This could be further assessed with CT. 3. Increased opacity in the medial left upper lobe suggestive of atelectasis. Electronically Signed   By: Lajean Manes M.D.   On: 06/28/2022 09:36   [place] EKG SITE RITE  Result Date: 06/26/2022 If Site Rite image not attached, placement could not be confirmed due to current cardiac rhythm.  Portable Chest x-ray  Result Date: 06/25/2022 CLINICAL DATA:  Intubated EXAM: PORTABLE CHEST 1 VIEW COMPARISON:  05/31/2022 FINDINGS: Endotracheal tube tip is about 5.6 cm superior to the carina. Esophageal tube tip below the diaphragm. Clear lung fields. Normal cardiac size. No pneumothorax. Scoliosis of the spine IMPRESSION: Endotracheal tube tip about 5.6 cm superior to carina. Clear lungs. Electronically Signed   By: Donavan Foil M.D.   On: 06/25/2022 19:57   CT Head Wo Contrast  Result Date: 06/24/2022 CLINICAL DATA:  Bilateral lower extremity weakness x2 months. EXAM: CT HEAD WITHOUT CONTRAST TECHNIQUE: Contiguous axial images were obtained from the base of the skull through the vertex without intravenous contrast. RADIATION DOSE REDUCTION: This exam was performed according to the departmental dose-optimization program which includes automated exposure control, adjustment of the mA and/or kV according to patient size and/or use of iterative reconstruction technique. COMPARISON:  None Available. FINDINGS: Brain: No evidence of acute infarction, hemorrhage, hydrocephalus, extra-axial collection or mass lesion/mass effect. Vascular: No hyperdense vessel or unexpected calcification. Skull: Normal. Negative for fracture or focal lesion. Sinuses/Orbits: No acute finding. Other:  None. IMPRESSION: No acute intracranial pathology. Electronically Signed   By: Virgina Norfolk M.D.   On: 06/24/2022 19:44   MR Lumbar Spine W Wo Contrast  Result Date: 06/24/2022 CLINICAL DATA:  Provided history: Low back pain, symptoms persist with greater than 6 weeks treatment. Additional history provided by the scanning technologist: The patient reports back pain, bilateral lower extremity numbness and decreased sensation (right greater than left). Right leg slightly weaker than left. EXAM: MRI LUMBAR SPINE WITHOUT AND WITH CONTRAST TECHNIQUE: Multiplanar and multiecho pulse sequences of the lumbar spine were obtained without and with intravenous contrast. CONTRAST:  62m GADAVIST GADOBUTROL 1 MMOL/ML IV SOLN COMPARISON:  Lumbar spine radiographs 03/23/2019. FINDINGS: Segmentation: 5 lumbar vertebrae. The caudal most well-formed interval disc space is designated L5-S1. Alignment: Levocurvature of the lumbar spine. No significant spondylolisthesis. Vertebrae: Vertebral body height is maintained. Mild degenerative endplate signal changes at L2-L3 and L3-L4. Conus medullaris and cauda equina: Conus extends to the L2 level. No signal abnormality identified within the visualized distal spinal cord. No pathologic enhancement of the visualized distal spinal  cord or cauda equina nerve roots. Paraspinal and other soft tissues: No paraspinal mass or collection. No acute finding within included portions of the abdomen/retroperitoneum. Disc levels: Mild disc degeneration at L5-S1. T12-L1: No significant disc herniation or stenosis. L1-L2: No significant disc herniation or stenosis. L2-L3: No significant disc herniation or stenosis. L3-L4: Slight disc bulge. No significant spinal canal or foraminal stenosis. L4-L5: No significant disc herniation or stenosis. L5-S1: Small left subarticular disc protrusion (at site of posterior annular fissure) (for as seen on series 5, image 9) (series 8, image 37). The disc protrusion  results in slight left subarticular narrowing, contacting the descending left S1 nerve root. No significant central canal stenosis or neural foraminal narrowing. IMPRESSION: Lumbar spondylosis, as outlined and with findings most notably as follows. At L5-S1, there is mild disc degeneration. Small left subarticular disc protrusion (at site of posterior annular fissure). The disc protrusion results in slight left subarticular narrowing, and contacts the descending left S1 nerve root. No significant central canal stenosis. Levocurvature of the lumbar spine. Electronically Signed   By: Kellie Simmering D.O.   On: 06/24/2022 19:07   US Abdomen Limited RUQ (LIVER/GB)  Result Date: 06/24/2022 CLINICAL DATA:  Right upper quadrant abdominal pain. EXAM: ULTRASOUND ABDOMEN LIMITED RIGHT UPPER QUADRANT COMPARISON:  June 03, 2022. FINDINGS: Gallbladder: Sludge and probable cholelithiasis is noted within the gallbladder lumen. No gallbladder wall thickening or pericholecystic fluid is noted. No sonographic Murphy's sign is noted. Common bile duct: Diameter: 3 mm which is within normal limits. Liver: No focal lesion identified. Within normal limits in parenchymal echogenicity. Portal vein is patent on color Doppler imaging with normal direction of blood flow towards the liver. Other: None. IMPRESSION: Cholelithiasis and gallbladder sludge is noted without evidence of cholecystitis. Electronically Signed   By: Marijo Conception M.D.   On: 06/24/2022 15:11   NM Gastric Emptying  Result Date: 06/09/2022 CLINICAL DATA:  Gastroparesis suspected.  Early satiety.  Nausea. EXAM: NUCLEAR MEDICINE GASTRIC EMPTYING SCAN TECHNIQUE: After oral ingestion of radiolabeled meal, sequential abdominal images were obtained for 4 hours. Percentage of activity emptying the stomach was calculated at 1 hour, 2 hour, 3 hour, and 4 hours. RADIOPHARMACEUTICALS:  2.01 mCi Tc-34msulfur colloid in standardized meal COMPARISON:  None Available. FINDINGS:  Expected location of the stomach in the left upper quadrant. Ingested meal empties the stomach gradually over the course of the study. 40.41% emptied at 1 hr ( normal >= 10%) 68.38% emptied at 2 hr ( normal >= 40%) 93.98% emptied at 3 hr ( normal >= 70%) IMPRESSION: Normal gastric emptying study. Electronically Signed   By: DDorise BullionIII M.D.   On: 06/09/2022 13:02   [place]dom[place] Limited RUQ (LIVER/GB)  Result Date: 06/03/2022 CLINICAL DATA:  Right upper quadrant pain with nausea for several months. EXAM: ULTRASOUND ABDOMEN LIMITED RIGHT UPPER QUADRANT COMPARISON:  CT abdomen pelvis dated 05/20/2022. FINDINGS: Gallbladder: Gallbladder sludge and gallbladder stones are noted with the largest calculus measuring 1.5 cm. No gallbladder wall thickening visualized. No sonographic Murphy sign noted by sonographer. Common bile duct: Diameter: 1 mm Liver: No focal lesion identified. Within normal limits in parenchymal echogenicity. Portal vein is patent on color Doppler imaging with normal direction of blood flow towards the liver. Other: None. IMPRESSION: Cholelithiasis with no evidence of acute cholecystitis. Electronically Signed   By: TZerita BoersM.D.   On: 06/03/2022 16:42    Labs:  CBC: Recent Labs    06/29/22 0736 06/30/22 0342 07/01/22 0351  07/02/22 0251  WBC 14.0* 15.1* 19.6* 20.3*  HGB 9.2* 10.2* 10.8* 9.8*  HCT 26.0* 28.6* 30.6* 27.8*  PLT 373 406* 461* 491*    COAGS: No results for input(s): "INR", "APTT" in the last 8760 hours.  BMP: Recent Labs    06/29/22 0736 06/30/22 0342 07/01/22 0351 07/02/22 0251  NA 132* 131* 131* 130*  K 3.4* 3.5 4.1 3.9  CL 96* 94* 97* 98  CO2 28 26 24 23  $ GLUCOSE 124* 102* 106* 112*  BUN 9 8 10 11  $ CALCIUM 8.2* 8.2* 8.3* 8.1*  CREATININE 0.55* 0.52* 0.47* 0.49*  GFRNONAA >60 >60 >60 >60    LIVER FUNCTION TESTS: Recent Labs    06/27/22 0431 06/30/22 0342 07/01/22 0351 07/02/22 0251  BILITOT 0.5 0.8 0.8 1.0  AST 26 24 17 15  $ ALT  17 18 18 16  $ ALKPHOS 43 63 74 73  PROT 4.9* 5.6* 5.9* 5.9*  ALBUMIN 2.1* 2.3* 2.2* 2.1*    TUMOR MARKERS: No results for input(s): "AFPTM", "CEA", "CA199", "CHROMGRNA" in the last 8760 hours.  Assessment and Plan: 37 y.o. male with past medical history significant for chronic abdominal pain, weight loss, bronchitis, multiple gastric ulcers, tobacco abuse, gallstones who presented to [facility] on 3/13 with abdominal pain.  She has been followed by [facility] as an outpatient and has had extensive workup in the past 3 years for similar presentation.  Endoscopy in August of last year revealed esophagitis with no bleeding, gastritis and biopsy with nonspecific reactive gastropathy.  He was recently seen by surgery on 2/8 to discuss cholecystectomy secondary to gallstones. On 2/14 patient underwent diagnostic laparoscopy which was converted to exploratory laparotomy with small bowel resection of the anastomosis and temporary abdominal closure for perforated small intestine/feculent peritonitis.  He underwent exploratory lap with washout and abdominal closure on 06/27/2022.  Secondary to persistent abdominal pain patient underwent follow-up CT abdomen pelvis yesterday which revealed:   1. Signs of small bowel resection in the RIGHT lower quadrant. No definitive signs of leak at this time such is gas or extraluminal contrast adjacent to the small bowel anastomosis. 2. Findings more suggestive of ileus in the setting of peritonitis. Partial small bowel obstruction in the RIGHT lower quadrant is another differential consideration, follow-up will be helpful. 3. Loculated LEFT subdiaphragmatic fluid suspicious for abscess deforming the spleen. 4. Small volume pneumoperitoneum with small locules of gas within perihepatic fluid also suspicious for complicated postoperative fluid though without as well-defined margins but associated with peritoneal enhancement. 5. Small fluid in the pelvis  adjacent to small bowel loops barely discernible in the axial plane seen best in the coronal plane in the LEFT hemipelvis. 6. Signs of anasarca with LEFT greater than RIGHT pleural effusions and body wall edema.   He is afebrile, WBC 20.3, hemoglobin 9.8, platelets 491k, creatinine 0.49, PT/INR pend; request now received from surgery team for image guided drainage of left subphrenic fluid collection.Risks and benefits discussed with the patient including bleeding, infection, damage to adjacent structures, bowel perforation/fistula connection, and sepsis.  All of the patient's questions were answered, patient is agreeable to proceed. Consent signed and in chart.  Procedure scheduled for 2/22.  HOLD LOVENOX UNTIL AFTER ABOVE PROCEDURE  Thank you for this interesting consult.  I greatly enjoyed meeting Gabriel Dalton and look forward to participating in their care.  A copy of this report was sent to the requesting provider on this date.  Electronically Signed: D. Rowe Robert, PA-C 07/02/2022,  11:39 AM   I spent a total of  25 minutes   in face to face in clinical consultation, greater than 50% of which was counseling/coordinating care for CT-guided drainage of left subphrenic fluid collection

## 2022-07-03 ENCOUNTER — Encounter (HOSPITAL_COMMUNITY): Payer: Self-pay | Admitting: Internal Medicine

## 2022-07-03 ENCOUNTER — Inpatient Hospital Stay (HOSPITAL_COMMUNITY): Payer: 59

## 2022-07-03 DIAGNOSIS — R1010 Upper abdominal pain, unspecified: Secondary | ICD-10-CM | POA: Diagnosis not present

## 2022-07-03 LAB — COMPREHENSIVE METABOLIC PANEL
ALT: 15 U/L (ref 0–44)
AST: 13 U/L — ABNORMAL LOW (ref 15–41)
Albumin: 2 g/dL — ABNORMAL LOW (ref 3.5–5.0)
Alkaline Phosphatase: 77 U/L (ref 38–126)
Anion gap: 10 (ref 5–15)
BUN: 12 mg/dL (ref 6–20)
CO2: 22 mmol/L (ref 22–32)
Calcium: 8.3 mg/dL — ABNORMAL LOW (ref 8.9–10.3)
Chloride: 100 mmol/L (ref 98–111)
Creatinine, Ser: 0.43 mg/dL — ABNORMAL LOW (ref 0.61–1.24)
GFR, Estimated: >60 mL/min (ref >60)
Glucose, Bld: 112 mg/dL — ABNORMAL HIGH (ref 70–99)
Potassium: 4 mmol/L (ref 3.5–5.1)
Sodium: 132 mmol/L — ABNORMAL LOW (ref 135–145)
Total Bilirubin: 0.7 mg/dL (ref 0.3–1.2)
Total Protein: 5.6 g/dL — ABNORMAL LOW (ref 6.5–8.1)

## 2022-07-03 LAB — CBC WITH DIFFERENTIAL/PLATELET
Abs Immature Granulocytes: 1.39 10*3/uL — ABNORMAL HIGH (ref 0.00–0.07)
Basophils Absolute: 0.1 10*3/uL (ref 0.0–0.1)
Basophils Relative: 0 %
Eosinophils Absolute: 0.1 10*3/uL (ref 0.0–0.5)
Eosinophils Relative: 1 %
HCT: 25.3 % — ABNORMAL LOW (ref 39.0–52.0)
Hemoglobin: 8.9 g/dL — ABNORMAL LOW (ref 13.0–17.0)
Immature Granulocytes: 5 %
Lymphocytes Relative: 8 %
Lymphs Abs: 2.2 10*3/uL (ref 0.7–4.0)
MCH: 28.9 pg (ref 26.0–34.0)
MCHC: 35.2 g/dL (ref 30.0–36.0)
MCV: 82.1 fL (ref 80.0–100.0)
Monocytes Absolute: 2.2 10*3/uL — ABNORMAL HIGH (ref 0.1–1.0)
Monocytes Relative: 8 %
Neutro Abs: 20.4 10*3/uL — ABNORMAL HIGH (ref 1.7–7.7)
Neutrophils Relative %: 78 %
Platelets: 547 10*3/uL — ABNORMAL HIGH (ref 150–400)
RBC: 3.08 MIL/uL — ABNORMAL LOW (ref 4.22–5.81)
RDW: 15.2 % (ref 11.5–15.5)
WBC: 26.4 10*3/uL — ABNORMAL HIGH (ref 4.0–10.5)
nRBC: 0.1 % (ref 0.0–0.2)

## 2022-07-03 LAB — PROTIME-INR
INR: 1.1 (ref 0.8–1.2)
Prothrombin Time: 14.4 seconds (ref 11.4–15.2)

## 2022-07-03 LAB — GLUCOSE, CAPILLARY
Glucose-Capillary: 105 mg/dL — ABNORMAL HIGH (ref 70–99)
Glucose-Capillary: 112 mg/dL — ABNORMAL HIGH (ref 70–99)
Glucose-Capillary: 114 mg/dL — ABNORMAL HIGH (ref 70–99)

## 2022-07-03 LAB — PHOSPHORUS: Phosphorus: 2.9 mg/dL (ref 2.5–4.6)

## 2022-07-03 LAB — TRIGLYCERIDES: Triglycerides: 170 mg/dL — ABNORMAL HIGH (ref <150)

## 2022-07-03 LAB — MAGNESIUM: Magnesium: 1.8 mg/dL (ref 1.7–2.4)

## 2022-07-03 MED ORDER — SODIUM CHLORIDE 0.9 % IV SOLN
INTRAVENOUS | Status: AC
  Filled 2022-07-03: qty 500

## 2022-07-03 MED ORDER — MIDAZOLAM HCL 2 MG/2ML IJ SOLN
INTRAMUSCULAR | Status: AC
  Filled 2022-07-03: qty 2

## 2022-07-03 MED ORDER — MAGNESIUM SULFATE 2 GM/50ML IV SOLN
2.0000 g | Freq: Once | INTRAVENOUS | Status: AC
  Administered 2022-07-03: 2 g via INTRAVENOUS
  Filled 2022-07-03: qty 50

## 2022-07-03 MED ORDER — ACETAMINOPHEN 500 MG PO TABS
1000.0000 mg | ORAL_TABLET | Freq: Four times a day (QID) | ORAL | Status: DC
  Administered 2022-07-03 – 2022-07-12 (×29): 1000 mg via ORAL
  Filled 2022-07-03 (×33): qty 2

## 2022-07-03 MED ORDER — SODIUM CHLORIDE 0.9% FLUSH
5.0000 mL | Freq: Three times a day (TID) | INTRAVENOUS | Status: DC
  Administered 2022-07-03 – 2022-07-12 (×26): 5 mL

## 2022-07-03 MED ORDER — FLUTICASONE PROPIONATE 50 MCG/ACT NA SUSP
1.0000 | Freq: Every day | NASAL | Status: DC
  Administered 2022-07-03 – 2022-07-11 (×2): 1 via NASAL
  Filled 2022-07-03: qty 16

## 2022-07-03 MED ORDER — BISACODYL 10 MG RE SUPP
10.0000 mg | Freq: Once | RECTAL | Status: AC
  Administered 2022-07-03: 10 mg via RECTAL
  Filled 2022-07-03: qty 1

## 2022-07-03 MED ORDER — MIDAZOLAM HCL 2 MG/2ML IJ SOLN
INTRAMUSCULAR | Status: AC | PRN
  Administered 2022-07-03: .5 mg via INTRAVENOUS
  Administered 2022-07-03: 1.5 mg via INTRAVENOUS
  Administered 2022-07-03 (×2): 1 mg via INTRAVENOUS

## 2022-07-03 MED ORDER — FLUMAZENIL 0.5 MG/5ML IV SOLN
INTRAVENOUS | Status: AC
  Filled 2022-07-03: qty 5

## 2022-07-03 MED ORDER — OXYCODONE-ACETAMINOPHEN 5-325 MG PO TABS
1.0000 | ORAL_TABLET | Freq: Once | ORAL | Status: AC
  Administered 2022-07-03: 1 via ORAL
  Filled 2022-07-03: qty 1

## 2022-07-03 MED ORDER — TRAVASOL 10 % IV SOLN
INTRAVENOUS | Status: AC
  Filled 2022-07-03: qty 960

## 2022-07-03 MED ORDER — FENTANYL CITRATE (PF) 100 MCG/2ML IJ SOLN
INTRAMUSCULAR | Status: AC | PRN
  Administered 2022-07-03 (×2): 50 ug via INTRAVENOUS

## 2022-07-03 MED ORDER — FENTANYL CITRATE (PF) 100 MCG/2ML IJ SOLN
INTRAMUSCULAR | Status: AC
  Filled 2022-07-03: qty 2

## 2022-07-03 MED ORDER — NALOXONE HCL 0.4 MG/ML IJ SOLN
INTRAMUSCULAR | Status: AC
  Filled 2022-07-03: qty 1

## 2022-07-03 NOTE — Progress Notes (Signed)
6 Days Post-Op   Subjective/Chief Complaint: No new changes.  Feels bowels rumbling, but no flatus or bowel movement yet.  No nausea.  Walked yesterday.   Objective: Vital signs in last 24 hours: Temp:  [98.2 F (36.8 C)-99.9 F (37.7 C)] 99.2 F (37.3 C) (02/22 0957) Pulse Rate:  [95-106] 95 (02/22 0957) Resp:  [15-19] 15 (02/22 0957) BP: (120-131)/(85-92) 120/90 (02/22 0957) SpO2:  [99 %-100 %] 100 % (02/22 0957) Weight:  [48 kg] 48 kg (02/22 1014) Last BM Date :  (Before admission)  Intake/Output from previous day: 02/21 0701 - 02/22 0700 In: 3638.3 [P.O.:30; I.V.:3123.2; IV Piggyback:485.1] Out: 1850 [Urine:1850] Intake/Output this shift: Total I/O In: 282.7 [I.V.:193; IV Piggyback:89.8] Out: -   PE: Abd: soft, approp tender, wound clean, ND  Lab Results:  Recent Labs    07/02/22 0251 07/03/22 0334  WBC 20.3* 26.4*  HGB 9.8* 8.9*  HCT 27.8* 25.3*  PLT 491* 547*   BMET Recent Labs    07/02/22 0251 07/03/22 0334  NA 130* 132*  K 3.9 4.0  CL 98 100  CO2 23 22  GLUCOSE 112* 112*  BUN 11 12  CREATININE 0.49* 0.43*  CALCIUM 8.1* 8.3*   PT/INR Recent Labs    07/03/22 0334  LABPROT 14.4  INR 1.1   ABG No results for input(s): "PHART", "HCO3" in the last 72 hours.  Invalid input(s): "PCO2", "PO2"   Studies/Results: CT ABDOMEN PELVIS W CONTRAST  Result Date: 07/01/2022 CLINICAL DATA:  Acute abdominal pain, postop for small-bowel resection with small bowel perforation and feculent peritonitis. EXAM: CT ABDOMEN AND PELVIS WITH CONTRAST TECHNIQUE: Multidetector CT imaging of the abdomen and pelvis was performed using the standard protocol following bolus administration of intravenous contrast. RADIATION DOSE REDUCTION: This exam was performed according to the departmental dose-optimization program which includes automated exposure control, adjustment of the mA and/or kV according to patient size and/or use of iterative reconstruction technique. CONTRAST:   131m OMNIPAQUE IOHEXOL 300 MG/ML  SOLN COMPARISON:  May 20, 2022 FINDINGS: Lower chest: Moderate LEFT and small RIGHT pleural effusions. Hepatobiliary: Suggestion of background steatosis of the liver. Portal vein is patent. No pericholecystic stranding or sign of biliary duct dilation. Perihepatic fluid with some peritoneal enhancement w and small locules of gas/pneumoperitoneum. Fluid tracks along the anterior abdomen adjacent to small bowel loops, for instance on image 53/2 an area measuring 6 x 1.1 cm is noted. Loculated fluid in the LEFT subdiaphragmatic region, see below. Pancreas: Pancreas is normal. Spleen: Spleen with compression of parenchyma due to loculated LEFT subdiaphragmatic fluid that tracks along the gastrosplenic ligament and beneath the LEFT hemidiaphragm. This fluid measures 8.5 x 4.3 cm (image 21/2) Adrenals/Urinary Tract: Adrenal glands are normal. Symmetric renal enhancement without hydronephrosis or perinephric stranding. Foley catheter decompresses the urinary bladder which is not well assessed. No suspicious renal lesion. Stomach/Bowel: Distortion of the stomach due to loculated LEFT subdiaphragmatic fluid adjacent to spleen and stomach. Contrast passes beyond the stomach into the small bowel becomes more dilute is it passes distally. Signs of small bowel resection in the RIGHT lower quadrant. Small bowel is dilated to this level. Colon becomes gradually decompressed along the descending colon and is moderately dilated as well and filled with gas. Small bowel dilation is intermittent. Contrast passes the level of the anastomotic site in the RIGHT lower quadrant becoming more dilute is it passes into the ileum. No discrete transition point is identified. There is a slight changing caliber just beyond the anastomosis  but bowel loops show mild distension to the level of the terminal ileum. No definitive signs of leak.  No gas near the anastomotic site. No focal fluid near the anastomotic  site. Vascular/Lymphatic: No aneurysmal dilation of the abdominal aorta. Calcified aortic atherosclerotic changes. No adenopathy in the abdomen or in the pelvis. Reproductive: Scrotal in generalized edema throughout the body wall likely reflecting anasarca or hypoproteinemia. Otherwise unremarkable to the extent evaluated by CT. Other: Loculated appearing fluid about the liver and in particular in the LEFT subdiaphragmatic space. Small locules of gas within this fluid in this recent postoperative patient. Peritoneal enhancement. Trace stranding and thickening along the midline surgical changes as well. Signs of fluid in the pelvis, best seen on image 55/4 measuring approximately 4.8 x 1.0 cm in the coronal plane adjacent to small bowel loops in the pelvis Musculoskeletal: Body wall edema. Signs of cachexia. No acute bone finding or destructive bone process. IMPRESSION: 1. Signs of small bowel resection in the RIGHT lower quadrant. No definitive signs of leak at this time such is gas or extraluminal contrast adjacent to the small bowel anastomosis. 2. Findings more suggestive of ileus in the setting of peritonitis. Partial small bowel obstruction in the RIGHT lower quadrant is another differential consideration, follow-up will be helpful. 3. Loculated LEFT subdiaphragmatic fluid suspicious for abscess deforming the spleen. 4. Small volume pneumoperitoneum with small locules of gas within perihepatic fluid also suspicious for complicated postoperative fluid though without as well-defined margins but associated with peritoneal enhancement. 5. Small fluid in the pelvis adjacent to small bowel loops barely discernible in the axial plane seen best in the coronal plane in the LEFT hemipelvis. 6. Signs of anasarca with LEFT greater than RIGHT pleural effusions and body wall edema. Aortic Atherosclerosis (ICD10-I70.0). Electronically Signed   By: Zetta Bills M.D.   On: 07/01/2022 16:25     Anti-infectives: Anti-infectives (From admission, onward)    Start     Dose/Rate Route Frequency Ordered Stop   06/26/22 0000  piperacillin-tazobactam (ZOSYN) IVPB 3.375 g        3.375 g 12.5 mL/hr over 240 Minutes Intravenous Every 8 hours 06/25/22 1832     06/25/22 1815  piperacillin-tazobactam (ZOSYN) IVPB 3.375 g        3.375 g 100 mL/hr over 30 Minutes Intravenous  Once 06/25/22 1802 06/25/22 1830   06/25/22 1756  piperacillin-tazobactam (ZOSYN) 3.375 GM/50ML IVPB       Note to Pharmacy: Ward, Christa K: cabinet override      06/25/22 1756 06/27/22 0333   06/25/22 1630  ceFAZolin (ANCEF) IVPB 2g/100 mL premix        2 g 200 mL/hr over 30 Minutes Intravenous  Once 06/25/22 1534 06/25/22 1640       Assessment/Plan: POD 8/6 s/p ex lap with SBR and washout for feculent peritonitis for SB perforation from SBO by Dr. Thermon Leyland  06/25/22 -feculent peritonitis found in OR with 2 SB perforations noted from obstructive process.  Unclear etiology. Mother had something similar and wife states sister is having some GI issues, ? Need for further work up to rule out [employer] or other etiology -Will wait for bowel function to advance diet and do po meds, but will give duocolax suppository today to hopefully jump start some bowel function.  If he has some results, maybe he can have some CLD after his IR procedure. -cont zosyn given intra-abdominal infection.  WBC up to 26K today. -IR for perc drain of perisplenic collection. -continue  bid dressing changes -will continue Tylenol, scheduled robaxin and do dilaudid for pain control -oob, pulm toilet -pathology on small bowel benign with significant luminal narrowing at one point -mobilize, pulm toilet   FEN - NPO /IVFs per primary/TPN VTE - lovenox ID - zosyn 2/13 --> Foley - out 2/21, voiding well  SPCM - TNA Post op acute respiratory failure - resolved  Henreitta Cea 07/03/2022

## 2022-07-03 NOTE — Progress Notes (Signed)
PHARMACY - TOTAL PARENTERAL NUTRITION CONSULT NOTE   Indication: malnutrition, bowel perforation, expected prolonged ileus  Patient Measurements: Height: 5' 8"$  (172.7 cm) Weight: 48 kg (105 lb 13.1 oz) IBW/kg (Calculated) : 68.4 TPN AdjBW (KG): 49.8 Body mass index is 16.09 kg/m.  Assessment: 48 yoM admitted with abdominal pain, weight loss.  Underwent laparoscopic cholecystectomy on 2/14 and was found with multiple bowel perforations and stool contamination of abdomen, converted to exploratory laparotomy, with small bowel resection and reanastomosis, and temporary abdominal closure.  TPN started on 06/25/22.    Glucose / Insulin: No noted hx DM.  A1c 5.8. Goal BG <150 - Hypoglycemia 2/17 ~4am with CBG 28 (despite TPN and D5 infusions).   - No further hypoglycemia episodes.  CBGs: 104-126 Electrolytes:  - Na remains slightly low at 132 with max conc Na in TPN; Magnesium 1.8 - other lytes wnl including CorrCa Renal: SCr < 1 - Difficult urethral catheterization performed by urology on 2/14 Hepatic: LFTs wnl - Albumin low - TG: 361 (2/19), 211 (2/21), 170 ( 2/22)  - Propofol off 2/15  Intake / Output; MIVF: NS @60$  ml/hr  - I/O: 1788 mL  - good UOP GI Imaging: - 2/20 Abd CT: Findings more suggestive of ileus in the setting of peritonitis. Loculated LEFT subdiaphragmatic fluid suspicious for abscess deforming the spleen. GI Surgeries / Procedures:  2/14 OR: small bowel resection and reanastomosis, open abdomen 2/16 OR: exp lap, washout and abdominal closure   Central access: Double lumen PICC placed 2/15 TPN start date: 2/15  Nutritional Goals: Goal TPN rate is 80 mL/hr to provide 96 g of protein and 2039 kcals per day.  - Thiamine 147m in TPN x5 days (2/15 - 2/19)  RD Assessment: Estimated Needs Total Energy Estimated Needs: 1950-2100 kcals Total Protein Estimated Needs: 90-100 grams Total Fluid Estimated Needs: >/= 1.9L  Current Nutrition:  NPO TPN   Plan:   Now:   - magnesium sulfate 2gm IV x1  At 1800:  Continue TPN at goal rate of 80 mL/hr Provides 96 g protein, 2039 kcal, 345 g dextrose   Electrolytes in TPN:  Na 150 mEq/L K 50 mEq/L Ca 591m/L Mg 15m66mL Phos 20 mmol/L.  Cl:Ac max Cl  Add standard MVI and trace elements to TPN SSI d/c by MD.  Change CBG checks to q8h. As CBGs have remained stable, will d/c CBG checks on 07/03/22 mIVF: NS at 60 ml/hr Monitor TPN labs on Mon/Thurs.  MD ordered CMET daily thru 2/24; phos and mag daily   NikRoyetta AsalharmD, BCPS 07/03/2022 11:19 AM

## 2022-07-03 NOTE — Procedures (Signed)
Interventional Radiology Procedure Note  Procedure: CT DRAIN LUQ ABSCESS    Complications: None  Estimated Blood Loss:  MIN  Findings: 70CC PUS CX SENT     Tamera Punt, MD

## 2022-07-03 NOTE — Progress Notes (Signed)
PROGRESS NOTE    Gabriel Dalton  [id number] DOB: 01-12-1986 DOA: 06/24/2022 PCP: Marrian Salvage, FNP   Brief Narrative: 37 yo BM PMHx chronic abd pain, wt loss, bronchitis, multiple gastric ulcers, tobacco abuse, gallstones (for which he saw [facility] outpt 2/8 and chole was offered but not yet scheduled) presented to [facility] ED 2/13 with CC abd pain. followed by gastroenterology labouer as outpatient and has had extensive workup in past 3 years for similar presentation-he has had extensive workup including multiple CT scans, MRI, ultrasound, NM gastric emptying study, most recent endoscopy evaluation in August 2023 revealing esophagitis with no bleeding, gastritis and biopsy with mild nonspecific reactive gastropathy, he has had positive occult blood 05/31/2022 which was attributed to hemorrhoids.  He endorses about 60 pound weight loss since the symptoms started.  He was recently seen by general surgery on 06/19/2022 to discuss cholecystectomy given that he has a gallstone. [facility] was consulted 2/13 and pt was posted for lap chole 2/14. Op course c/b  identification of numerous small bowel perforations and was converted to open surgery, with evacuation of 1L bowel contents.  Assessment & Plan:   Principal Problem:   Cholelithiasis Active Problems:   Pain of upper abdomen   Severe protein-calorie malnutrition (HCC)   Small bowel perforation (HCC)   Endotracheal tube present   Gallstones   Peritonitis ([place])   Endotracheally intubated   #1 sepsis due to peritonitis from small bowel perforation-on Zosyn.  Still with leukocytosis 20.3 He still has not had a bowel movement or having flatus.  Continue NPO.  General surgery following.  Appreciate their input. CT abdomen 07/01/2022-suggestive of ileus in the setting of peritonitis.  Loculated left subdiaphragmatic fluid suspicious for abscess deforming the spleen.  Suspicious complicated postoperative fluid perihepatic.  Left greater than  right pleural effusion and body wall edema.  IR to place placement of drain at the left subphrenic fluid collection  Leukocytosis worsening Encourage incentive spirometry use  #2 small bowel perforation status postresection and reanastomosis  #3 BPH difficult Foley placement placed by urology Will DC today  #4 multiple electrolyte abnormalities including hyponatremia, hypokalemia, hypomagnesemia and hypophosphatemia-on TPN   Nutrition Problem: Severe Malnutrition Etiology: chronic illness   Signs/Symptoms: severe fat depletion, severe muscle depletion, percent weight loss (22% weight loss in 11 months) Percent weight loss: 22 % (in 11 months)    Interventions: Refer to RD note for recommendations, Boost Breeze, MVI  Estimated body mass index is 16.19 kg/m as calculated from the following:   Height as of this encounter: 5' 8"$  (1.727 m).   Weight as of this encounter: 48.3 kg.  DVT prophylaxis: Lovenox Code Status: Full code Family Communication: Wife at bedside  disposition Plan:  Status is: Inpatient Remains inpatient appropriate because: On TPN status post peritonitis   Consultants:  General surgery PCCM  Procedures: Ex lap 06/25/2022 Intubation Extubated 06/28/2022 06/27/2022 OR for washout and abdominal closure  Antimicrobials: Zosyn  Subjective: Foley out yesterday Urinating without issue WBC up to 26.4 from 20 Dressing on the abdomen changed this morning already  Objective: Vitals:   07/02/22 2129 07/03/22 0003 07/03/22 0629 07/03/22 0957  BP: 129/88 127/87 123/85 (!) 120/90  Pulse: (!) 106 (!) 104 (!) 101 95  Resp: 19 18 17 15  $ Temp: 99.9 F (37.7 C) 99.8 F (37.7 C) 98.6 F (37 C) 99.2 F (37.3 C)  TempSrc: Oral Oral Oral Oral  SpO2: 100% 100% 100% 100%  Weight:      Height:  Intake/Output Summary (Last 24 hours) at 07/03/2022 1024 Last data filed at 07/03/2022 [id number] Gross per 24 hour  Intake 3608.32 ml  Output 1500 ml  Net 2108.32 ml     Filed Weights   06/25/22 2040 06/25/22 2053 06/26/22 1045  Weight: 48.8 kg 49.8 kg 48.3 kg    Examination: General exam: Appears in no acute distress  respiratory system: Diminished at the bases  cardiovascular system: S1 & S2 heard, RRR. No JVD, murmurs, rubs, gallops or clicks. No pedal edema. Gastrointestinal system: Abdomen is distended, soft and tender. No organomegaly or masses felt. Normal bowel sounds heard.  Covered with dressing Central nervous system: Alert and oriented. No focal neurological deficits. Extremities: No edema  skin: No rashes, lesions or ulcers Psychiatry: Judgement and insight appear normal. Mood & affect appropriate.     Data Reviewed: I have personally reviewed following labs and imaging studies  CBC: Recent Labs  Lab 06/29/22 0736 06/30/22 0342 07/01/22 0351 07/02/22 0251 07/03/22 0334  WBC 14.0* 15.1* 19.6* 20.3* 26.4*  NEUTROABS  --   --  14.0* 14.2* 20.4*  HGB 9.2* 10.2* 10.8* 9.8* 8.9*  HCT 26.0* 28.6* 30.6* 27.8* 25.3*  MCV 82.3 81.9 81.8 81.3 82.1  PLT 373 406* 461* 491* 547*    Basic Metabolic Panel: Recent Labs  Lab 06/29/22 0736 06/30/22 0342 07/01/22 0351 07/02/22 0251 07/03/22 0334  NA 132* 131* 131* 130* 132*  K 3.4* 3.5 4.1 3.9 4.0  CL 96* 94* 97* 98 100  CO2 28 26 24 23 22  $ GLUCOSE 124* 102* 106* 112* 112*  BUN 9 8 10 11 12  $ CREATININE 0.55* 0.52* 0.47* 0.49* 0.43*  CALCIUM 8.2* 8.2* 8.3* 8.1* 8.3*  MG 1.6* 1.9 2.0 1.8 1.8  PHOS 1.8* 2.4* 2.8 3.0 2.9    GFR: Estimated Creatinine Clearance: 88 mL/min (A) (by C-G formula based on SCr of 0.43 mg/dL (L)). Liver Function Tests: Recent Labs  Lab 06/27/22 0431 06/30/22 0342 07/01/22 0351 07/02/22 0251 07/03/22 0334  AST 26 24 17 15 $ 13*  ALT 17 18 18 16 15  $ ALKPHOS 43 63 74 73 77  BILITOT 0.5 0.8 0.8 1.0 0.7  PROT 4.9* 5.6* 5.9* 5.9* 5.6*  ALBUMIN 2.1* 2.3* 2.2* 2.1* 2.0*    No results for input(s): "LIPASE", "AMYLASE" in the last 168 hours. No results  for input(s): "AMMONIA" in the last 168 hours. Coagulation Profile: Recent Labs  Lab 07/03/22 0334  INR 1.1   Cardiac Enzymes: No results for input(s): "CKTOTAL", "CKMB", "CKMBINDEX", "TROPONINI" in the last 168 hours. BNP (last 3 results) No results for input(s): "PROBNP" in the last 8760 hours. HbA1C: No results for input(s): "HGBA1C" in the last 72 hours. CBG: Recent Labs  Lab 07/02/22 0715 07/02/22 1122 07/02/22 1533 07/03/22 0001 07/03/22 0711  GLUCAP 126* 104* 117* 112* 114*    Lipid Profile: Recent Labs    07/02/22 0251 07/03/22 0337  TRIG 211* 170*    Thyroid Function Tests: No results for input(s): "TSH", "T4TOTAL", "FREET4", "T3FREE", "THYROIDAB" in the last 72 hours. Anemia Panel: Recent Labs    07/01/22 0351  VITAMINB12 962*  FOLATE 5.3*  FERRITIN 602*  TIBC 157*  IRON 17*  RETICCTPCT 0.4    Sepsis Labs: No results for input(s): "PROCALCITON", "LATICACIDVEN" in the last 168 hours.  Recent Results (from the past 240 hour(s))  MRSA Next Gen by PCR, Nasal     Status: None   Collection Time: 06/25/22 10:10 PM   Specimen: Nasal Mucosa; Nasal  Swab  Result Value Ref Range Status   MRSA by PCR Next Gen NOT DETECTED NOT DETECTED Final    Comment: (NOTE) The GeneXpert MRSA Assay (FDA approved for NASAL specimens only), is one component of a comprehensive MRSA colonization surveillance program. It is not intended to diagnose MRSA infection nor to guide or monitor treatment for MRSA infections. Test performance is not FDA approved in patients less than 67 years old. Performed at [facility], [place] [address] [place], [place] [address]          Radiology Studies: CT ABDOMEN PELVIS W CONTRAST  Result Date: 07/01/2022 CLINICAL DATA:  Acute abdominal pain, postop for small-bowel resection with small bowel perforation and feculent peritonitis. EXAM: CT ABDOMEN AND PELVIS WITH CONTRAST TECHNIQUE: Multidetector CT imaging of the  abdomen and pelvis was performed using the standard protocol following bolus administration of intravenous contrast. RADIATION DOSE REDUCTION: This exam was performed according to the departmental dose-optimization program which includes automated exposure control, adjustment of the mA and/or kV according to patient size and/or use of iterative reconstruction technique. CONTRAST:  150m OMNIPAQUE IOHEXOL 300 MG/ML  SOLN COMPARISON:  May 20, 2022 FINDINGS: Lower chest: Moderate LEFT and small RIGHT pleural effusions. Hepatobiliary: Suggestion of background steatosis of the liver. Portal vein is patent. No pericholecystic stranding or sign of biliary duct dilation. Perihepatic fluid with some peritoneal enhancement w and small locules of gas/pneumoperitoneum. Fluid tracks along the anterior abdomen adjacent to small bowel loops, for instance on image 53/2 an area measuring 6 x 1.1 cm is noted. Loculated fluid in the LEFT subdiaphragmatic region, see below. Pancreas: Pancreas is normal. Spleen: Spleen with compression of parenchyma due to loculated LEFT subdiaphragmatic fluid that tracks along the gastrosplenic ligament and beneath the LEFT hemidiaphragm. This fluid measures 8.5 x 4.3 cm (image 21/2) Adrenals/Urinary Tract: Adrenal glands are normal. Symmetric renal enhancement without hydronephrosis or perinephric stranding. Foley catheter decompresses the urinary bladder which is not well assessed. No suspicious renal lesion. Stomach/Bowel: Distortion of the stomach due to loculated LEFT subdiaphragmatic fluid adjacent to spleen and stomach. Contrast passes beyond the stomach into the small bowel becomes more dilute is it passes distally. Signs of small bowel resection in the RIGHT lower quadrant. Small bowel is dilated to this level. Colon becomes gradually decompressed along the descending colon and is moderately dilated as well and filled with gas. Small bowel dilation is intermittent. Contrast passes the level  of the anastomotic site in the RIGHT lower quadrant becoming more dilute is it passes into the ileum. No discrete transition point is identified. There is a slight changing caliber just beyond the anastomosis but bowel loops show mild distension to the level of the terminal ileum. No definitive signs of leak.  No gas near the anastomotic site. No focal fluid near the anastomotic site. Vascular/Lymphatic: No aneurysmal dilation of the abdominal aorta. Calcified aortic atherosclerotic changes. No adenopathy in the abdomen or in the pelvis. Reproductive: Scrotal in generalized edema throughout the body wall likely reflecting anasarca or hypoproteinemia. Otherwise unremarkable to the extent evaluated by CT. Other: Loculated appearing fluid about the liver and in particular in the LEFT subdiaphragmatic space. Small locules of gas within this fluid in this recent postoperative patient. Peritoneal enhancement. Trace stranding and thickening along the midline surgical changes as well. Signs of fluid in the pelvis, best seen on image 55/4 measuring approximately 4.8 x 1.0 cm in the coronal plane adjacent to small bowel loops in the pelvis Musculoskeletal: Body  wall edema. Signs of cachexia. No acute bone finding or destructive bone process. IMPRESSION: 1. Signs of small bowel resection in the RIGHT lower quadrant. No definitive signs of leak at this time such is gas or extraluminal contrast adjacent to the small bowel anastomosis. 2. Findings more suggestive of ileus in the setting of peritonitis. Partial small bowel obstruction in the RIGHT lower quadrant is another differential consideration, follow-up will be helpful. 3. Loculated LEFT subdiaphragmatic fluid suspicious for abscess deforming the spleen. 4. Small volume pneumoperitoneum with small locules of gas within perihepatic fluid also suspicious for complicated postoperative fluid though without as well-defined margins but associated with peritoneal enhancement. 5.  Small fluid in the pelvis adjacent to small bowel loops barely discernible in the axial plane seen best in the coronal plane in the LEFT hemipelvis. 6. Signs of anasarca with LEFT greater than RIGHT pleural effusions and body wall edema. Aortic Atherosclerosis (ICD10-I70.0). Electronically Signed   By: Zetta Bills M.D.   On: 07/01/2022 16:25        Scheduled Meds:  Chlorhexidine Gluconate Cloth  6 each Topical Q0600   [START ON 07/04/2022] enoxaparin (LOVENOX) injection  40 mg Subcutaneous Daily   fentaNYL  1 patch Transdermal Q72H   pantoprazole (PROTONIX) IV  40 mg Intravenous Q24H   simethicone  80 mg Oral QID   sodium chloride flush  10-40 mL Intracatheter Q12H   Continuous Infusions:  sodium chloride Stopped (06/27/22 0832)   sodium chloride 60 mL/hr at 07/02/22 2031   methocarbamol (ROBAXIN) IV 1,000 mg (07/03/22 0943)   methocarbamol (ROBAXIN) IV 500 mg (07/02/22 2048)   piperacillin-tazobactam 3.375 g (07/03/22 0733)   TPN ADULT (ION) 80 mL/hr at 07/02/22 1824     LOS: 7 days    Time spent: 4 min  Georgette Shell, MD  07/03/2022, 10:24 AM

## 2022-07-04 DIAGNOSIS — K8020 Calculus of gallbladder without cholecystitis without obstruction: Secondary | ICD-10-CM | POA: Diagnosis not present

## 2022-07-04 LAB — COMPREHENSIVE METABOLIC PANEL
ALT: 15 U/L (ref 0–44)
AST: 14 U/L — ABNORMAL LOW (ref 15–41)
Albumin: 2.1 g/dL — ABNORMAL LOW (ref 3.5–5.0)
Alkaline Phosphatase: 98 U/L (ref 38–126)
Anion gap: 8 (ref 5–15)
BUN: 12 mg/dL (ref 6–20)
CO2: 23 mmol/L (ref 22–32)
Calcium: 8.2 mg/dL — ABNORMAL LOW (ref 8.9–10.3)
Chloride: 101 mmol/L (ref 98–111)
Creatinine, Ser: 0.43 mg/dL — ABNORMAL LOW (ref 0.61–1.24)
GFR, Estimated: >60 mL/min (ref >60)
Glucose, Bld: 117 mg/dL — ABNORMAL HIGH (ref 70–99)
Potassium: 3.9 mmol/L (ref 3.5–5.1)
Sodium: 132 mmol/L — ABNORMAL LOW (ref 135–145)
Total Bilirubin: 0.5 mg/dL (ref 0.3–1.2)
Total Protein: 6.2 g/dL — ABNORMAL LOW (ref 6.5–8.1)

## 2022-07-04 LAB — MAGNESIUM: Magnesium: 1.9 mg/dL (ref 1.7–2.4)

## 2022-07-04 LAB — CBC WITH DIFFERENTIAL/PLATELET
Abs Immature Granulocytes: 1.11 10*3/uL — ABNORMAL HIGH (ref 0.00–0.07)
Basophils Absolute: 0.1 10*3/uL (ref 0.0–0.1)
Basophils Relative: 0 %
Eosinophils Absolute: 0.2 10*3/uL (ref 0.0–0.5)
Eosinophils Relative: 1 %
HCT: 24.1 % — ABNORMAL LOW (ref 39.0–52.0)
Hemoglobin: 8.3 g/dL — ABNORMAL LOW (ref 13.0–17.0)
Immature Granulocytes: 4 %
Lymphocytes Relative: 8 %
Lymphs Abs: 2.3 10*3/uL (ref 0.7–4.0)
MCH: 28.7 pg (ref 26.0–34.0)
MCHC: 34.4 g/dL (ref 30.0–36.0)
MCV: 83.4 fL (ref 80.0–100.0)
Monocytes Absolute: 1.9 10*3/uL — ABNORMAL HIGH (ref 0.1–1.0)
Monocytes Relative: 7 %
Neutro Abs: 22.7 10*3/uL — ABNORMAL HIGH (ref 1.7–7.7)
Neutrophils Relative %: 80 %
Platelets: 622 10*3/uL — ABNORMAL HIGH (ref 150–400)
RBC: 2.89 MIL/uL — ABNORMAL LOW (ref 4.22–5.81)
RDW: 15.2 % (ref 11.5–15.5)
WBC: 28.2 10*3/uL — ABNORMAL HIGH (ref 4.0–10.5)
nRBC: 0 % (ref 0.0–0.2)

## 2022-07-04 LAB — VITAMIN B1: Vitamin B1 (Thiamine): 49.5 nmol/L — ABNORMAL LOW (ref 66.5–200.0)

## 2022-07-04 LAB — PHOSPHORUS: Phosphorus: 3 mg/dL (ref 2.5–4.6)

## 2022-07-04 MED ORDER — OXYCODONE HCL 5 MG PO TABS
5.0000 mg | ORAL_TABLET | ORAL | Status: DC | PRN
  Administered 2022-07-04 – 2022-07-10 (×25): 10 mg via ORAL
  Filled 2022-07-04 (×25): qty 2

## 2022-07-04 MED ORDER — HYDROMORPHONE HCL 1 MG/ML IJ SOLN
0.5000 mg | INTRAMUSCULAR | Status: DC | PRN
  Administered 2022-07-04 – 2022-07-05 (×5): 2 mg via INTRAVENOUS
  Filled 2022-07-04 (×5): qty 2

## 2022-07-04 MED ORDER — TRAVASOL 10 % IV SOLN
INTRAVENOUS | Status: AC
  Filled 2022-07-04: qty 960

## 2022-07-04 NOTE — Progress Notes (Signed)
PT Cancellation Note  Patient Details Name: Gabriel Dalton MRN: [id number] DOB: Sep 19, 1985   Cancelled Treatment:    Reason Eval/Treat Not Completed: Patient at procedure or test/unavailable when PT checked,  will check back another time [place] Office [phone] Weekend pager-[phone]   Claretha Cooper 07/04/2022, 3:54 PM

## 2022-07-04 NOTE — TOC Progression Note (Signed)
Transition of Care [facility]) - Progression Note   Patient Details  Name: Gabriel Dalton MRN: [id number] Date of Birth: 08-27-1985  Transition of Care [facility]) CM/SW [place], LCSW Phone Number: 07/04/2022, 12:49 PM  Clinical Narrative: CSW spoke with patient regarding discharge planning. As patient is expected to be hospitalized for several more days, CSW will set up services closer to discharge.    Barriers to Discharge: Continued Medical Work up  Social Determinants of Health (SDOH) Interventions SDOH Screenings   Depression (PHQ2-9): High Risk (04/17/2022)  Tobacco Use: High Risk (07/03/2022)   Readmission Risk Interventions     No data to display

## 2022-07-04 NOTE — Progress Notes (Signed)
Nutrition Follow-up  DOCUMENTATION CODES:   Severe malnutrition in context of chronic illness  INTERVENTION:   -TPN management per Pharmacy  -Monitor for diet advancement  NUTRITION DIAGNOSIS:   Severe Malnutrition related to chronic illness as evidenced by severe fat depletion, severe muscle depletion, percent weight loss (22% weight loss in 11 months).  Ongoing.  GOAL:   Patient will meet greater than or equal to 90% of their needs  Meeting with TPN  MONITOR:   PO intake, Supplement acceptance, Diet advancement, Weight trends  ASSESSMENT:   37 yo male with of chronic abdomen pain and weight loss presented to the ED for further evaluation. He was being followed by gastroenterology lumbar as outpatient and has had extensive workup in past 3 years for similar presentation. Admitted after being found to have cholelithiasis.  2/13 Admit 2/14 ex-lap, found to have SB perf, s/p small bowel resection and reanastomosis, and temporary abdominal closure; remained intubated afterwards 2/16 washout and abdominal closure 2/17 Extubated 2/22 s/p drain  TPN continues at 80 ml/hr, providing 2039 kcals and 96g protein.   Admission weight: 108 lbs Current weight: 105 lbs  Medications reviewed.  Labs reviewed: CBGs: 105-114 Low Na TG: 170   Diet Order:   Diet Order             Diet clear liquid Fluid consistency: Thin  Diet effective now                   EDUCATION NEEDS:   Education needs have been addressed  Skin:  Skin Assessment: Skin Integrity Issues: Skin Integrity Issues:: Incisions Incisions: Abdomen  Last BM:  2/22 -type 6  Height:   Ht Readings from Last 1 Encounters:  06/25/22 '5\' 8"'$  (1.727 m)    Weight:   Wt Readings from Last 1 Encounters:  07/03/22 48 kg   BMI:  Body mass index is 16.09 kg/m.  Estimated Nutritional Needs:   Kcal:  1950-2100 kcals  Protein:  90-100 grams  Fluid:  >/= 1.9L  Clayton Bibles, MS, RD, LDN Inpatient  Clinical Dietitian Contact information available via Amion

## 2022-07-04 NOTE — Progress Notes (Signed)
PROGRESS NOTE    Gabriel Dalton  [id number] DOB: 18-Mar-1986 DOA: 06/24/2022 PCP: Marrian Salvage, FNP   Brief Narrative: 37 yo BM PMHx chronic abd pain, wt loss, bronchitis, multiple gastric ulcers, tobacco abuse, gallstones (for which he saw [facility] outpt 2/8 and chole was offered but not yet scheduled) presented to [facility] ED 2/13 with CC abd pain. followed by gastroenterology labouer as outpatient and has had extensive workup in past 3 years for similar presentation-he has had extensive workup including multiple CT scans, MRI, ultrasound, NM gastric emptying study, most recent endoscopy evaluation in August 2023 revealing esophagitis with no bleeding, gastritis and biopsy with mild nonspecific reactive gastropathy, he has had positive occult blood 05/31/2022 which was attributed to hemorrhoids.  He endorses about 60 pound weight loss since the symptoms started.  He was recently seen by general surgery on 06/19/2022 to discuss cholecystectomy given that he has a gallstone. [facility] was consulted 2/13 and pt was posted for lap chole 2/14. Op course c/b  identification of numerous small bowel perforations and was converted to open surgery, with evacuation of 1L bowel contents.  Assessment & Plan:   Principal Problem:   Cholelithiasis Active Problems:   Pain of upper abdomen   Severe protein-calorie malnutrition (HCC)   Small bowel perforation (HCC)   Endotracheal tube present   Gallstones   Peritonitis ([place])   Endotracheally intubated   #1 sepsis due to peritonitis from small bowel perforation-on Zosyn.  Still with significant leukocytosis which has worsened.  Patient had a bowel movement and flatus yesterday after suppository.  Starting him on clear liquids today by surgery.   CT abdomen 07/01/2022-suggestive of ileus in the setting of peritonitis.  Loculated left subdiaphragmatic fluid suspicious for abscess deforming the spleen.  Suspicious complicated postoperative fluid  perihepatic.  Left greater than right pleural effusion and body wall edema.  IR placed drain at the  LUQ 2/22 WBC 28 TRENDING UP hopefully this will trend down with now placement of the drainage tube as well as antibiotics. On zosyn 2/15  #2 small bowel perforation status postresection and reanastomosis  #3 BPH difficult Foley placement placed by urology Will DC today  #4 multiple electrolyte abnormalities including hyponatremia, hypokalemia, hypomagnesemia and hypophosphatemia-on TPN   Nutrition Problem: Severe Malnutrition Etiology: chronic illness   Signs/Symptoms: severe fat depletion, severe muscle depletion, percent weight loss (22% weight loss in 11 months) Percent weight loss: 22 % (in 11 months)    Interventions: Refer to RD note for recommendations, Boost Breeze, MVI  Estimated body mass index is 16.09 kg/m as calculated from the following:   Height as of this encounter: '5\' 8"'$  (1.727 m).   Weight as of this encounter: 48 kg.  DVT prophylaxis: Lovenox Code Status: Full code Family Communication: Wife at bedside  disposition Plan:  Status is: Inpatient Remains inpatient appropriate because: On TPN status post peritonitis   Consultants:  General surgery PCCM  Procedures: Ex lap 06/25/2022 Intubation Extubated 06/28/2022 06/27/2022 OR for washout and abdominal closure  Antimicrobials: Zosyn  Subjective:  Still with a lot of pain requiring IV narcotics overnight.  Had a BM and flatus after suppository. Objective: Vitals:   07/03/22 2208 07/04/22 0155 07/04/22 0600 07/04/22 0922  BP: (!) 135/95 135/80 (!) 127/91 132/87  Pulse: (!) 106 95 (!) 103 96  Resp: '18 17 16 16  '$ Temp: 98.9 F (37.2 C) 98.4 F (36.9 C) 98.3 F (36.8 C) 98.4 F (36.9 C)  TempSrc: Oral Oral Oral Oral  SpO2: 100% 99% 100% 100%  Weight:      Height:        Intake/Output Summary (Last 24 hours) at 07/04/2022 1324 Last data filed at 07/04/2022 1129 Gross per 24 hour  Intake 3204.77 ml   Output 2400 ml  Net 804.77 ml    Filed Weights   06/25/22 2053 06/26/22 1045 07/03/22 1014  Weight: 49.8 kg 48.3 kg 48 kg    Examination: General exam: Appears in no acute distress  respiratory system: Diminished at the bases  cardiovascular system: S1 & S2 heard, RRR. No JVD, murmurs, rubs, gallops or clicks. No pedal edema. Gastrointestinal system: Abdomen is distended, soft and tender. No organomegaly or masses felt. Normal bowel sounds heard.  Covered with dressing Left upper quadrant drain in place with cloudy looking drainage noted Central nervous system: Alert and oriented. No focal neurological deficits. Extremities: No edema  skin: No rashes, lesions or ulcers Psychiatry: Judgement and insight appear normal. Mood & affect appropriate.     Data Reviewed: I have personally reviewed following labs and imaging studies  CBC: Recent Labs  Lab 06/30/22 0342 07/01/22 0351 07/02/22 0251 07/03/22 0334 07/04/22 0431  WBC 15.1* 19.6* 20.3* 26.4* 28.2*  NEUTROABS  --  14.0* 14.2* 20.4* 22.7*  HGB 10.2* 10.8* 9.8* 8.9* 8.3*  HCT 28.6* 30.6* 27.8* 25.3* 24.1*  MCV 81.9 81.8 81.3 82.1 83.4  PLT 406* 461* 491* 547* 622*    Basic Metabolic Panel: Recent Labs  Lab 06/30/22 0342 07/01/22 0351 07/02/22 0251 07/03/22 0334 07/04/22 0431  NA 131* 131* 130* 132* 132*  K 3.5 4.1 3.9 4.0 3.9  CL 94* 97* 98 100 101  CO2 '26 24 23 22 23  '$ GLUCOSE 102* 106* 112* 112* 117*  BUN '8 10 11 12 12  '$ CREATININE 0.52* 0.47* 0.49* 0.43* 0.43*  CALCIUM 8.2* 8.3* 8.1* 8.3* 8.2*  MG 1.9 2.0 1.8 1.8 1.9  PHOS 2.4* 2.8 3.0 2.9 3.0    GFR: Estimated Creatinine Clearance: 87.5 mL/min (A) (by C-G formula based on SCr of 0.43 mg/dL (L)). Liver Function Tests: Recent Labs  Lab 06/30/22 0342 07/01/22 0351 07/02/22 0251 07/03/22 0334 07/04/22 0431  AST '24 17 15 '$ 13* 14*  ALT '18 18 16 15 15  '$ ALKPHOS 63 74 73 77 98  BILITOT 0.8 0.8 1.0 0.7 0.5  PROT 5.6* 5.9* 5.9* 5.6* 6.2*  ALBUMIN 2.3*  2.2* 2.1* 2.0* 2.1*    No results for input(s): "LIPASE", "AMYLASE" in the last 168 hours. No results for input(s): "AMMONIA" in the last 168 hours. Coagulation Profile: Recent Labs  Lab 07/03/22 0334  INR 1.1    Cardiac Enzymes: No results for input(s): "CKTOTAL", "CKMB", "CKMBINDEX", "TROPONINI" in the last 168 hours. BNP (last 3 results) No results for input(s): "PROBNP" in the last 8760 hours. HbA1C: No results for input(s): "HGBA1C" in the last 72 hours. CBG: Recent Labs  Lab 07/02/22 1122 07/02/22 1533 07/03/22 0001 07/03/22 0711 07/03/22 1608  GLUCAP 104* 117* 112* 114* 105*    Lipid Profile: Recent Labs    07/02/22 0251 07/03/22 0337  TRIG 211* 170*    Thyroid Function Tests: No results for input(s): "TSH", "T4TOTAL", "FREET4", "T3FREE", "THYROIDAB" in the last 72 hours. Anemia Panel: No results for input(s): "VITAMINB12", "FOLATE", "FERRITIN", "TIBC", "IRON", "RETICCTPCT" in the last 72 hours.  Sepsis Labs: No results for input(s): "PROCALCITON", "LATICACIDVEN" in the last 168 hours.  Recent Results (from the past 240 hour(s))  MRSA Next Gen by PCR, Nasal  Status: None   Collection Time: 06/25/22 10:10 PM   Specimen: Nasal Mucosa; Nasal Swab  Result Value Ref Range Status   MRSA by PCR Next Gen NOT DETECTED NOT DETECTED Final    Comment: (NOTE) The GeneXpert MRSA Assay (FDA approved for NASAL specimens only), is one component of a comprehensive MRSA colonization surveillance program. It is not intended to diagnose MRSA infection nor to guide or monitor treatment for MRSA infections. Test performance is not FDA approved in patients less than 55 years old. Performed at [facility], [place] [address] [place], [place] [address]   Aerobic/Anaerobic Culture w Gram Stain (surgical/deep wound)     Status: None (Preliminary result)   Collection Time: 07/03/22  3:34 PM   Specimen: Abscess  Result Value Ref Range Status   Specimen  Description   Final    ABSCESS Performed at [place] [address] [place], [place] [address]    Special Requests   Final    Normal Performed at [facility], [place] [address] [place], [place] [address]    Gram Stain PENDING  Incomplete   Culture   Final    NO GROWTH < 24 HOURS Performed at [facility], [place] [address] [place], [place] [address]    Report Status PENDING  Incomplete         Radiology Studies: CT GUIDED PERITONEAL/RETROPERITONEAL FLUID DRAIN BY PERC CATH  Result Date: 07/03/2022 INDICATION: Left upper quadrant perisplenic/subdiaphragmatic abscess EXAM: CT DRAINAGE LEFT UPPER QUADRANT ABSCESS Date:  07/03/2022 07/03/2022 4:03 pm Radiologist:  M. Daryll Brod, MD Guidance:  CT FLUOROSCOPY: None. MEDICATIONS: 1% lidocaine local ANESTHESIA/SEDATION: Moderate (conscious) sedation was employed during this procedure. A total of Versed 4.0 mg and Fentanyl 100 mcg was administered intravenously. Moderate Sedation Time: 28 minutes. The patient's level of consciousness and vital signs were monitored continuously by radiology nursing throughout the procedure under my direct supervision. CONTRAST:  None. COMPLICATIONS: None immediate. PROCEDURE: Informed consent was obtained from the patient following explanation of the procedure, risks, benefits and alternatives. The patient understands, agrees and consents for the procedure. All questions were addressed. A time out was performed. Maximal barrier sterile technique utilized including caps, mask, sterile gowns, sterile gloves, large sterile drape, hand hygiene, and ChloraPrep. Previous imaging reviewed. Patient position left side up decubitus. Noncontrast localization CT performed. The left upper quadrant fluid collection about the spleen and beneath the diaphragm was localized and marked for a posterior intercostal approach. Under sterile conditions and local anesthesia, an 18 gauge introducer  needle was advanced. Needle position confirmed with CT. Guidewire inserted followed by tract dilatation to insert a 10 [place] drain. Unfortunately,, the drain catheter passed beneath the spleen and medial to the fluid collection. This catheter was cut and removed. At a more anterior intercostal space, an 18 gauge introducer needle was advanced into a large component of the fluid collection. Needle position confirmed with CT. Needle was directed cephalad to the subdiaphragmatic area. Guidewire inserted easily followed by tract dilatation insert a 10 French drain. Drain catheter position confirmed within the subdiaphragmatic abscess by CT. Syringe aspiration yielded 70 cc purulent fluid. Catheter secured with Prolene suture and a sterile dressing. Suction bulb connected. Sample sent for culture. Patient tolerated the procedure well. IMPRESSION: Successful CT-guided left upper quadrant abscess drain placement as above. Electronically Signed   By: Jerilynn Mages.  Shick M.D.   On: 07/03/2022 16:42        Scheduled Meds:  acetaminophen  1,000 mg Oral Q6H   Chlorhexidine Gluconate Cloth  6 each Topical Q0600   enoxaparin (LOVENOX) injection  40 mg Subcutaneous Daily   fentaNYL  1 patch Transdermal Q72H   fluticasone  1 spray Each Nare Daily   pantoprazole (PROTONIX) IV  40 mg Intravenous Q24H   simethicone  80 mg Oral QID   sodium chloride flush  10-40 mL Intracatheter Q12H   sodium chloride flush  5 mL Intracatheter Q8H   Continuous Infusions:  sodium chloride Stopped (06/27/22 0832)   sodium chloride 60 mL/hr at 07/04/22 0346   methocarbamol (ROBAXIN) IV 1,000 mg (07/04/22 1003)   methocarbamol (ROBAXIN) IV 500 mg (07/02/22 2048)   piperacillin-tazobactam 3.375 g (07/04/22 0903)   TPN ADULT (ION) 80 mL/hr at 07/04/22 0346   TPN ADULT (ION)       LOS: 8 days    Time spent: 39 min  Georgette Shell, MD  07/04/2022, 1:24 PM

## 2022-07-04 NOTE — Progress Notes (Signed)
PHARMACY - TOTAL PARENTERAL NUTRITION CONSULT NOTE   Indication: malnutrition, bowel perforation, expected prolonged ileus  Patient Measurements: Height: '5\' 8"'$  (172.7 cm) Weight: 48 kg (105 lb 13.1 oz) IBW/kg (Calculated) : 68.4 TPN AdjBW (KG): 49.8 Body mass index is 16.09 kg/m.  Assessment: 81 yoM admitted with abdominal pain, weight loss.  Underwent laparoscopic cholecystectomy on 2/14 and was found with multiple bowel perforations and stool contamination of abdomen, converted to exploratory laparotomy, with small bowel resection and reanastomosis, and temporary abdominal closure.  TPN started on 06/25/22.    Glucose / Insulin: No noted hx DM.  A1c 5.8. Goal BG <150 - CBGs: 105-117 Electrolytes:  - Na remains slightly low at 132 with max conc Na in TPN; Magnesium 1.9, K 3.9 - other lytes wnl including CorrCa - Goal for ileus: Mag > 2, K > 4, Phos ~3 Renal: SCr < 1 - Difficult urethral catheterization performed by urology on 2/14 Hepatic: LFTs wnl - Albumin low - Propofol 2/14 -  2/15  - TG: 361 (2/19), 211 (2/21), 170 ( 2/22)  Intake / Output; MIVF: NS '@60'$  ml/hr  - I/O: +1883 mL - good UOP GI Imaging: - 2/20 Abd CT: Findings more suggestive of ileus in the setting of peritonitis. Loculated LEFT subdiaphragmatic fluid suspicious for abscess deforming the spleen. GI Surgeries / Procedures:  2/14 OR: small bowel resection and reanastomosis, open abdomen 2/16 OR: exp lap, washout and abdominal closure   Central access: Double lumen PICC placed 2/15 TPN start date: 2/15  Nutritional Goals: Goal TPN rate is 80 mL/hr to provide 96 g of protein and 2039 kcals per day.  - Thiamine '100mg'$  in TPN x5 days (2/15 - 2/19)  RD Assessment: Estimated Needs Total Energy Estimated Needs: 1950-2100 kcals Total Protein Estimated Needs: 90-100 grams Total Fluid Estimated Needs: >/= 1.9L  Current Nutrition:  NPO TPN   Plan:   At 1800:  Continue TPN at goal rate of 80 mL/hr Provides  96 g protein, 2039 kcal, 345 g dextrose   Electrolytes in TPN:  Na 150 mEq/L K 50 mEq/L Ca 91mq/L Increase Mg to 7 mEq/L Phos 20 mmol/L.  Cl:Ac max Cl  Add standard MVI and trace elements to TPN mIVF: NS at 60 ml/hr Monitor TPN labs on Mon/Thurs.  MD ordered CMET daily thru 2/24, phos and mag daily   NRoyetta Asal PharmD, BCPS 07/04/2022 7:11 AM

## 2022-07-04 NOTE — Progress Notes (Addendum)
7 Days Post-Op  Subjective: CC: S/p IR drain yesterday with 70 cc purulent fluid  Stable to slightly improved abdominal pain from yesterday. Still using multiple doses of IV pain medication. Tolerating small sips of cld. No n/v. Flatus and bm after suppository yesterday. Voiding. Mobilizing with walker.   Objective: Vital signs in last 24 hours: Temp:  [98.3 F (36.8 C)-98.9 F (37.2 C)] 98.4 F (36.9 C) (02/23 0922) Pulse Rate:  [95-119] 96 (02/23 0922) Resp:  [16-36] 16 (02/23 0922) BP: (97-135)/(67-97) 132/87 (02/23 0922) SpO2:  [99 %-100 %] 100 % (02/23 0922) Last BM Date : 07/03/22  Intake/Output from previous day: 02/22 0701 - 02/23 0700 In: 3773.6 [P.O.:150; I.V.:3110.2; IV Piggyback:513.4] Out: 1890 [Urine:1750; Drains:140] Intake/Output this shift: Total I/O In: -  Out: 410 [Urine:400; Drains:10]  PE: Gen:  Alert, NAD, pleasant Abd: Soft, at least mild distension, diffuse ttp greater in the upper abdomen. No rigidity or guarding. +BS. Midline wound clean. IR drain purulent.   Lab Results:  Recent Labs    07/03/22 0334 07/04/22 0431  WBC 26.4* 28.2*  HGB 8.9* 8.3*  HCT 25.3* 24.1*  PLT 547* 622*   BMET Recent Labs    07/03/22 0334 07/04/22 0431  NA 132* 132*  K 4.0 3.9  CL 100 101  CO2 22 23  GLUCOSE 112* 117*  BUN 12 12  CREATININE 0.43* 0.43*  CALCIUM 8.3* 8.2*   PT/INR Recent Labs    07/03/22 0334  LABPROT 14.4  INR 1.1   CMP     Component Value Date/Time   NA 132 (L) 07/04/2022 0431   K 3.9 07/04/2022 0431   CL 101 07/04/2022 0431   CO2 23 07/04/2022 0431   GLUCOSE 117 (H) 07/04/2022 0431   BUN 12 07/04/2022 0431   CREATININE 0.43 (L) 07/04/2022 0431   CALCIUM 8.2 (L) 07/04/2022 0431   PROT 6.2 (L) 07/04/2022 0431   ALBUMIN 2.1 (L) 07/04/2022 0431   AST 14 (L) 07/04/2022 0431   ALT 15 07/04/2022 0431   ALKPHOS 98 07/04/2022 0431   BILITOT 0.5 07/04/2022 0431   GFRNONAA >60 07/04/2022 0431   GFRAA >60 04/28/2019 1313    Lipase     Component Value Date/Time   LIPASE 30 06/24/2022 1400    Studies/Results: CT GUIDED PERITONEAL/RETROPERITONEAL FLUID DRAIN BY PERC CATH  Result Date: 07/03/2022 INDICATION: Left upper quadrant perisplenic/subdiaphragmatic abscess EXAM: CT DRAINAGE LEFT UPPER QUADRANT ABSCESS Date:  07/03/2022 07/03/2022 4:03 pm Radiologist:  M. Daryll Brod, MD Guidance:  CT FLUOROSCOPY: None. MEDICATIONS: 1% lidocaine local ANESTHESIA/SEDATION: Moderate (conscious) sedation was employed during this procedure. A total of Versed 4.0 mg and Fentanyl 100 mcg was administered intravenously. Moderate Sedation Time: 28 minutes. The patient's level of consciousness and vital signs were monitored continuously by radiology nursing throughout the procedure under my direct supervision. CONTRAST:  None. COMPLICATIONS: None immediate. PROCEDURE: Informed consent was obtained from the patient following explanation of the procedure, risks, benefits and alternatives. The patient understands, agrees and consents for the procedure. All questions were addressed. A time out was performed. Maximal barrier sterile technique utilized including caps, mask, sterile gowns, sterile gloves, large sterile drape, hand hygiene, and ChloraPrep. Previous imaging reviewed. Patient position left side up decubitus. Noncontrast localization CT performed. The left upper quadrant fluid collection about the spleen and beneath the diaphragm was localized and marked for a posterior intercostal approach. Under sterile conditions and local anesthesia, an 18 gauge introducer needle was advanced. Needle position confirmed with  CT. Guidewire inserted followed by tract dilatation to insert a 10 [place] drain. Unfortunately,, the drain catheter passed beneath the spleen and medial to the fluid collection. This catheter was cut and removed. At a more anterior intercostal space, an 18 gauge introducer needle was advanced into a large component of the fluid  collection. Needle position confirmed with CT. Needle was directed cephalad to the subdiaphragmatic area. Guidewire inserted easily followed by tract dilatation insert a 10 French drain. Drain catheter position confirmed within the subdiaphragmatic abscess by CT. Syringe aspiration yielded 70 cc purulent fluid. Catheter secured with Prolene suture and a sterile dressing. Suction bulb connected. Sample sent for culture. Patient tolerated the procedure well. IMPRESSION: Successful CT-guided left upper quadrant abscess drain placement as above. Electronically Signed   By: Jerilynn Mages.  Shick M.D.   On: 07/03/2022 16:42    Anti-infectives: Anti-infectives (From admission, onward)    Start     Dose/Rate Route Frequency Ordered Stop   06/26/22 0000  piperacillin-tazobactam (ZOSYN) IVPB 3.375 g        3.375 g 12.5 mL/hr over 240 Minutes Intravenous Every 8 hours 06/25/22 1832     06/25/22 1815  piperacillin-tazobactam (ZOSYN) IVPB 3.375 g        3.375 g 100 mL/hr over 30 Minutes Intravenous  Once 06/25/22 1802 06/25/22 1830   06/25/22 1756  piperacillin-tazobactam (ZOSYN) 3.375 GM/50ML IVPB       Note to Pharmacy: Ward, Christa K: cabinet override      06/25/22 1756 06/27/22 0333   06/25/22 1630  ceFAZolin (ANCEF) IVPB 2g/100 mL premix        2 g 200 mL/hr over 30 Minutes Intravenous  Once 06/25/22 1534 06/25/22 1640        Assessment/Plan POD 9/7 s/p ex lap with SBR and washout for feculent peritonitis for SB perforation from SBO by Dr. Thermon Leyland  06/25/22 - Feculent peritonitis found in OR with 2 SB perforations noted from obstructive process.  Unclear etiology. Mother had something similar and wife states sister is having some GI issues, ? Need for further work up to rule out [employer] or other etiology - S/p IR perc drain 2/22. Cx's pending. Drain flushes per their team.  - Cont zosyn given intra-abdominal infection.  WBC up to 28K today. - Allow CLD today. Would not advance past this till more  reliable bowel function - Continue bid wtd dressing changes - Oob, pulm toilet - Pathology on small bowel benign with significant luminal narrowing at one point - Mobilize, pulm toilet   FEN - CLD. TNA. IVFs per primary/TPN VTE - SCDs, Lovenox ID - Zosyn 2/13 --> Foley - out 2/21, voiding well   SPCM - TNA Post op acute respiratory failure - resolved   LOS: 8 days    Jillyn Ledger , [facility] Surgery 07/04/2022, 10:31 AM Please see Amion for pager number during day hours 7:00am-4:30pm

## 2022-07-04 NOTE — Progress Notes (Signed)
Referring Physician(s): Byerly,F  Supervising Physician: Aletta Edouard  Patient Status:  [facility] - In-pt  Chief Complaint:  abdominal/back pain, left upper abdominal fluid collection  Subjective: Patient with some continued abdominal/back discomfort;   denies nausea /vomiting   Allergies: Aspirin  Medications: Prior to Admission medications   Medication Sig Start Date End Date Taking? Authorizing Provider  acetaminophen (TYLENOL) 500 MG tablet Take 1,000 mg by mouth as needed for moderate pain.   Yes [provider]  ciprofloxacin (CIPRO) 500 MG tablet Take 500 mg by mouth 2 (two) times daily. 06/20/22  Yes [provider]  dicyclomine (BENTYL) 20 MG tablet Take 1 tablet (20 mg total) by mouth 2 (two) times daily. Patient taking differently: Take 20 mg by mouth daily as needed for spasms. 05/28/22  Yes Zehr, Laban Emperor, PA-C  linaclotide (LINZESS) 145 MCG CAPS capsule Take 1 capsule (145 mcg total) by mouth daily before breakfast. Patient taking differently: Take 145 mcg by mouth as needed (constipation). 04/23/22  Yes Zehr, Laban Emperor, PA-C  pantoprazole (PROTONIX) 40 MG tablet Take 1 tablet (40 mg total) by mouth 2 (two) times daily. 04/23/22  Yes Zehr, Laban Emperor, PA-C  sucralfate (CARAFATE) 1 g tablet Take 1 tablet (1 g total) by mouth 4 (four) times daily -  with meals and at bedtime. Patient taking differently: Take 1 g by mouth as needed (heartburn). 05/28/22  Yes Zehr, Laban Emperor, PA-C  tamsulosin (FLOMAX) 0.4 MG CAPS capsule Take 0.4 mg by mouth daily. 06/20/22  Yes [provider]  ketorolac (TORADOL) 10 MG tablet Take 10 mg by mouth every 6 (six) hours as needed. Patient not taking: Reported on 06/25/2022 06/20/22   [provider]  oxyCODONE-acetaminophen (PERCOCET/ROXICET) 5-325 MG tablet Take 1 tablet by mouth every 6 (six) hours as needed for severe pain. Patient not taking: Reported on 06/25/2022 05/18/22   Rex Kras, PA  promethazine  (PHENERGAN) 25 MG tablet Take 1 tablet (25 mg total) by mouth every 6 (six) hours as needed for nausea or vomiting. Patient not taking: Reported on 06/25/2022 05/18/22   Rex Kras, PA     Vital Signs: BP 135/82 (BP Location: Left Arm)   Pulse 96   Temp 98.6 F (37 C) (Oral)   Resp 18   Ht '5\' 8"'$  (1.727 m)   Wt 105 lb 13.1 oz (48 kg)   SpO2 100%   BMI 16.09 kg/m   Physical Exam awake, alert.  Left upper abdominal drain intact, insertion site mildly tender, output 150 cc since yesterday turbid beige colored fluid; drain flushed without difficulty  Imaging: CT GUIDED PERITONEAL/RETROPERITONEAL FLUID DRAIN BY PERC CATH  Result Date: 07/03/2022 INDICATION: Left upper quadrant perisplenic/subdiaphragmatic abscess EXAM: CT DRAINAGE LEFT UPPER QUADRANT ABSCESS Date:  07/03/2022 07/03/2022 4:03 pm Radiologist:  M. Daryll Brod, MD Guidance:  CT FLUOROSCOPY: None. MEDICATIONS: 1% lidocaine local ANESTHESIA/SEDATION: Moderate (conscious) sedation was employed during this procedure. A total of Versed 4.0 mg and Fentanyl 100 mcg was administered intravenously. Moderate Sedation Time: 28 minutes. The patient's level of consciousness and vital signs were monitored continuously by radiology nursing throughout the procedure under my direct supervision. CONTRAST:  None. COMPLICATIONS: None immediate. PROCEDURE: Informed consent was obtained from the patient following explanation of the procedure, risks, benefits and alternatives. The patient understands, agrees and consents for the procedure. All questions were addressed. A time out was performed. Maximal barrier sterile technique utilized including caps, mask, sterile gowns, sterile gloves, large sterile drape, hand hygiene,  and ChloraPrep. Previous imaging reviewed. Patient position left side up decubitus. Noncontrast localization CT performed. The left upper quadrant fluid collection about the spleen and beneath the diaphragm was localized and marked for a posterior  intercostal approach. Under sterile conditions and local anesthesia, an 18 gauge introducer needle was advanced. Needle position confirmed with CT. Guidewire inserted followed by tract dilatation to insert a 10 [place] drain. Unfortunately,, the drain catheter passed beneath the spleen and medial to the fluid collection. This catheter was cut and removed. At a more anterior intercostal space, an 18 gauge introducer needle was advanced into a large component of the fluid collection. Needle position confirmed with CT. Needle was directed cephalad to the subdiaphragmatic area. Guidewire inserted easily followed by tract dilatation insert a 10 French drain. Drain catheter position confirmed within the subdiaphragmatic abscess by CT. Syringe aspiration yielded 70 cc purulent fluid. Catheter secured with Prolene suture and a sterile dressing. Suction bulb connected. Sample sent for culture. Patient tolerated the procedure well. IMPRESSION: Successful CT-guided left upper quadrant abscess drain placement as above. Electronically Signed   By: Jerilynn Mages.  Shick M.D.   On: 07/03/2022 16:42   CT ABDOMEN PELVIS W CONTRAST  Result Date: 07/01/2022 CLINICAL DATA:  Acute abdominal pain, postop for small-bowel resection with small bowel perforation and feculent peritonitis. EXAM: CT ABDOMEN AND PELVIS WITH CONTRAST TECHNIQUE: Multidetector CT imaging of the abdomen and pelvis was performed using the standard protocol following bolus administration of intravenous contrast. RADIATION DOSE REDUCTION: This exam was performed according to the departmental dose-optimization program which includes automated exposure control, adjustment of the mA and/or kV according to patient size and/or use of iterative reconstruction technique. CONTRAST:  16m OMNIPAQUE IOHEXOL 300 MG/ML  SOLN COMPARISON:  May 20, 2022 FINDINGS: Lower chest: Moderate LEFT and small RIGHT pleural effusions. Hepatobiliary: Suggestion of background steatosis of the liver.  Portal vein is patent. No pericholecystic stranding or sign of biliary duct dilation. Perihepatic fluid with some peritoneal enhancement w and small locules of gas/pneumoperitoneum. Fluid tracks along the anterior abdomen adjacent to small bowel loops, for instance on image 53/2 an area measuring 6 x 1.1 cm is noted. Loculated fluid in the LEFT subdiaphragmatic region, see below. Pancreas: Pancreas is normal. Spleen: Spleen with compression of parenchyma due to loculated LEFT subdiaphragmatic fluid that tracks along the gastrosplenic ligament and beneath the LEFT hemidiaphragm. This fluid measures 8.5 x 4.3 cm (image 21/2) Adrenals/Urinary Tract: Adrenal glands are normal. Symmetric renal enhancement without hydronephrosis or perinephric stranding. Foley catheter decompresses the urinary bladder which is not well assessed. No suspicious renal lesion. Stomach/Bowel: Distortion of the stomach due to loculated LEFT subdiaphragmatic fluid adjacent to spleen and stomach. Contrast passes beyond the stomach into the small bowel becomes more dilute is it passes distally. Signs of small bowel resection in the RIGHT lower quadrant. Small bowel is dilated to this level. Colon becomes gradually decompressed along the descending colon and is moderately dilated as well and filled with gas. Small bowel dilation is intermittent. Contrast passes the level of the anastomotic site in the RIGHT lower quadrant becoming more dilute is it passes into the ileum. No discrete transition point is identified. There is a slight changing caliber just beyond the anastomosis but bowel loops show mild distension to the level of the terminal ileum. No definitive signs of leak.  No gas near the anastomotic site. No focal fluid near the anastomotic site. Vascular/Lymphatic: No aneurysmal dilation of the abdominal aorta. Calcified aortic atherosclerotic changes. No adenopathy  in the abdomen or in the pelvis. Reproductive: Scrotal in generalized edema  throughout the body wall likely reflecting anasarca or hypoproteinemia. Otherwise unremarkable to the extent evaluated by CT. Other: Loculated appearing fluid about the liver and in particular in the LEFT subdiaphragmatic space. Small locules of gas within this fluid in this recent postoperative patient. Peritoneal enhancement. Trace stranding and thickening along the midline surgical changes as well. Signs of fluid in the pelvis, best seen on image 55/4 measuring approximately 4.8 x 1.0 cm in the coronal plane adjacent to small bowel loops in the pelvis Musculoskeletal: Body wall edema. Signs of cachexia. No acute bone finding or destructive bone process. IMPRESSION: 1. Signs of small bowel resection in the RIGHT lower quadrant. No definitive signs of leak at this time such is gas or extraluminal contrast adjacent to the small bowel anastomosis. 2. Findings more suggestive of ileus in the setting of peritonitis. Partial small bowel obstruction in the RIGHT lower quadrant is another differential consideration, follow-up will be helpful. 3. Loculated LEFT subdiaphragmatic fluid suspicious for abscess deforming the spleen. 4. Small volume pneumoperitoneum with small locules of gas within perihepatic fluid also suspicious for complicated postoperative fluid though without as well-defined margins but associated with peritoneal enhancement. 5. Small fluid in the pelvis adjacent to small bowel loops barely discernible in the axial plane seen best in the coronal plane in the LEFT hemipelvis. 6. Signs of anasarca with LEFT greater than RIGHT pleural effusions and body wall edema. Aortic Atherosclerosis (ICD10-I70.0). Electronically Signed   By: Zetta Bills M.D.   On: 07/01/2022 16:25    Labs:  CBC: Recent Labs    07/01/22 0351 07/02/22 0251 07/03/22 0334 07/04/22 0431  WBC 19.6* 20.3* 26.4* 28.2*  HGB 10.8* 9.8* 8.9* 8.3*  HCT 30.6* 27.8* 25.3* 24.1*  PLT 461* 491* 547* 622*    COAGS: Recent Labs     07/03/22 0334  INR 1.1    BMP: Recent Labs    07/01/22 0351 07/02/22 0251 07/03/22 0334 07/04/22 0431  NA 131* 130* 132* 132*  K 4.1 3.9 4.0 3.9  CL 97* 98 100 101  CO2 '24 23 22 23  '$ GLUCOSE 106* 112* 112* 117*  BUN '10 11 12 12  '$ CALCIUM 8.3* 8.1* 8.3* 8.2*  CREATININE 0.47* 0.49* 0.43* 0.43*  GFRNONAA >60 >60 >60 >60    LIVER FUNCTION TESTS: Recent Labs    07/01/22 0351 07/02/22 0251 07/03/22 0334 07/04/22 0431  BILITOT 0.8 1.0 0.7 0.5  AST 17 15 13* 14*  ALT '18 16 15 15  '$ ALKPHOS 74 73 77 98  PROT 5.9* 5.9* 5.6* 6.2*  ALBUMIN 2.2* 2.1* 2.0* 2.1*    Assessment and Plan: POD 9/7 s/p ex lap with SBR and washout for feculent peritonitis for SB perforation from SBO by Dr. Thermon Leyland  06/25/22 ; s/p LUQ perisplenic fluid collection drain 07/03/22; afebrile, WBC 28.2 up from 26.4, hemoglobin 8.3 down from 8.9, creatinine 0.43, drain fluid culture negative to date; continue with drain irrigation, close output monitoring, lab checks and follow-up culture data; obtain follow-up CT scan if WBC continues to increase or once  drain output is minimal   Electronically Signed: D. Rowe Robert, PA-C 07/04/2022, 3:40 PM   I spent a total of 15 minutes at the the patient's bedside AND on the patient's hospital floor or unit, greater than 50% of which was counseling/coordinating care for left upper abdominal/perisplenic fluid collection drain    Patient ID: Gabriel Dalton, male   DOB:  [birth year], 37 y.o.   MRN: [id number]

## 2022-07-05 DIAGNOSIS — R1010 Upper abdominal pain, unspecified: Secondary | ICD-10-CM | POA: Diagnosis not present

## 2022-07-05 LAB — COMPREHENSIVE METABOLIC PANEL
ALT: 18 U/L (ref 0–44)
AST: 16 U/L (ref 15–41)
Albumin: 2.3 g/dL — ABNORMAL LOW (ref 3.5–5.0)
Alkaline Phosphatase: 135 U/L — ABNORMAL HIGH (ref 38–126)
Anion gap: 9 (ref 5–15)
BUN: 12 mg/dL (ref 6–20)
CO2: 24 mmol/L (ref 22–32)
Calcium: 8.5 mg/dL — ABNORMAL LOW (ref 8.9–10.3)
Chloride: 98 mmol/L (ref 98–111)
Creatinine, Ser: 0.51 mg/dL — ABNORMAL LOW (ref 0.61–1.24)
GFR, Estimated: >60 mL/min (ref >60)
Glucose, Bld: 105 mg/dL — ABNORMAL HIGH (ref 70–99)
Potassium: 4.2 mmol/L (ref 3.5–5.1)
Sodium: 131 mmol/L — ABNORMAL LOW (ref 135–145)
Total Bilirubin: 0.7 mg/dL (ref 0.3–1.2)
Total Protein: 6.3 g/dL — ABNORMAL LOW (ref 6.5–8.1)

## 2022-07-05 LAB — CBC WITH DIFFERENTIAL/PLATELET
Abs Immature Granulocytes: 0.78 10*3/uL — ABNORMAL HIGH (ref 0.00–0.07)
Basophils Absolute: 0.1 10*3/uL (ref 0.0–0.1)
Basophils Relative: 0 %
Eosinophils Absolute: 0.1 10*3/uL (ref 0.0–0.5)
Eosinophils Relative: 1 %
HCT: 24.2 % — ABNORMAL LOW (ref 39.0–52.0)
Hemoglobin: 8.3 g/dL — ABNORMAL LOW (ref 13.0–17.0)
Immature Granulocytes: 3 %
Lymphocytes Relative: 9 %
Lymphs Abs: 2.4 10*3/uL (ref 0.7–4.0)
MCH: 28.4 pg (ref 26.0–34.0)
MCHC: 34.3 g/dL (ref 30.0–36.0)
MCV: 82.9 fL (ref 80.0–100.0)
Monocytes Absolute: 1.8 10*3/uL — ABNORMAL HIGH (ref 0.1–1.0)
Monocytes Relative: 7 %
Neutro Abs: 20.3 10*3/uL — ABNORMAL HIGH (ref 1.7–7.7)
Neutrophils Relative %: 80 %
Platelets: 826 10*3/uL — ABNORMAL HIGH (ref 150–400)
RBC: 2.92 MIL/uL — ABNORMAL LOW (ref 4.22–5.81)
RDW: 15.4 % (ref 11.5–15.5)
WBC: 25.4 10*3/uL — ABNORMAL HIGH (ref 4.0–10.5)
nRBC: 0 % (ref 0.0–0.2)

## 2022-07-05 LAB — MAGNESIUM: Magnesium: 1.7 mg/dL (ref 1.7–2.4)

## 2022-07-05 LAB — PHOSPHORUS: Phosphorus: 3.4 mg/dL (ref 2.5–4.6)

## 2022-07-05 MED ORDER — ZOLPIDEM TARTRATE 5 MG PO TABS
10.0000 mg | ORAL_TABLET | Freq: Once | ORAL | Status: AC
  Administered 2022-07-05: 10 mg via ORAL
  Filled 2022-07-05: qty 2

## 2022-07-05 MED ORDER — TRAVASOL 10 % IV SOLN
INTRAVENOUS | Status: AC
  Filled 2022-07-05: qty 960

## 2022-07-05 MED ORDER — MAGNESIUM SULFATE 2 GM/50ML IV SOLN
2.0000 g | Freq: Once | INTRAVENOUS | Status: AC
  Administered 2022-07-05: 2 g via INTRAVENOUS
  Filled 2022-07-05: qty 50

## 2022-07-05 MED ORDER — HYDROMORPHONE HCL 1 MG/ML IJ SOLN
0.5000 mg | INTRAMUSCULAR | Status: DC | PRN
  Administered 2022-07-05 – 2022-07-08 (×27): 2 mg via INTRAVENOUS
  Administered 2022-07-09: 1 mg via INTRAVENOUS
  Administered 2022-07-09 (×2): 2 mg via INTRAVENOUS
  Administered 2022-07-09: 1 mg via INTRAVENOUS
  Administered 2022-07-09: 2 mg via INTRAVENOUS
  Administered 2022-07-09 (×2): 1 mg via INTRAVENOUS
  Administered 2022-07-10 – 2022-07-11 (×8): 2 mg via INTRAVENOUS
  Filled 2022-07-05: qty 1
  Filled 2022-07-05 (×5): qty 2
  Filled 2022-07-05: qty 1
  Filled 2022-07-05 (×15): qty 2
  Filled 2022-07-05: qty 1
  Filled 2022-07-05 (×2): qty 2
  Filled 2022-07-05: qty 1
  Filled 2022-07-05 (×16): qty 2

## 2022-07-05 NOTE — Progress Notes (Signed)
8 Days Post-Op   Subjective/Chief Complaint: Complains of abd pain about the same as it has been   Objective: Vital signs in last 24 hours: Temp:  [98.1 F (36.7 C)-99 F (37.2 C)] 98.1 F (36.7 C) (02/24 0942) Pulse Rate:  [96-105] 103 (02/24 0942) Resp:  [18] 18 (02/24 0942) BP: (120-135)/(82-97) 121/82 (02/24 0942) SpO2:  [100 %] 100 % (02/24 0942) Last BM Date : 07/03/22  Intake/Output from previous day: 02/23 0701 - 02/24 0700 In: 4025.6 [P.O.:300; I.V.:3219; IV Piggyback:496.6] Out: [id number] [Urine:1630; Drains:40] Intake/Output this shift: No intake/output data recorded.  General appearance: alert and cooperative Resp: clear to auscultation bilaterally Cardio: regular rate and rhythm GI: soft, moderate tenderness. Drain output purulent  Lab Results:  Recent Labs    07/04/22 0431 07/05/22 0335  WBC 28.2* 25.4*  HGB 8.3* 8.3*  HCT 24.1* 24.2*  PLT 622* 826*   BMET Recent Labs    07/04/22 0431 07/05/22 0335  NA 132* 131*  K 3.9 4.2  CL 101 98  CO2 23 24  GLUCOSE 117* 105*  BUN 12 12  CREATININE 0.43* 0.51*  CALCIUM 8.2* 8.5*   PT/INR Recent Labs    07/03/22 0334  LABPROT 14.4  INR 1.1   ABG No results for input(s): "PHART", "HCO3" in the last 72 hours.  Invalid input(s): "PCO2", "PO2"  Studies/Results: CT GUIDED PERITONEAL/RETROPERITONEAL FLUID DRAIN BY PERC CATH  Result Date: 07/03/2022 INDICATION: Left upper quadrant perisplenic/subdiaphragmatic abscess EXAM: CT DRAINAGE LEFT UPPER QUADRANT ABSCESS Date:  07/03/2022 07/03/2022 4:03 pm Radiologist:  M. Daryll Brod, MD Guidance:  CT FLUOROSCOPY: None. MEDICATIONS: 1% lidocaine local ANESTHESIA/SEDATION: Moderate (conscious) sedation was employed during this procedure. A total of Versed 4.0 mg and Fentanyl 100 mcg was administered intravenously. Moderate Sedation Time: 28 minutes. The patient's level of consciousness and vital signs were monitored continuously by radiology nursing throughout the  procedure under my direct supervision. CONTRAST:  None. COMPLICATIONS: None immediate. PROCEDURE: Informed consent was obtained from the patient following explanation of the procedure, risks, benefits and alternatives. The patient understands, agrees and consents for the procedure. All questions were addressed. A time out was performed. Maximal barrier sterile technique utilized including caps, mask, sterile gowns, sterile gloves, large sterile drape, hand hygiene, and ChloraPrep. Previous imaging reviewed. Patient position left side up decubitus. Noncontrast localization CT performed. The left upper quadrant fluid collection about the spleen and beneath the diaphragm was localized and marked for a posterior intercostal approach. Under sterile conditions and local anesthesia, an 18 gauge introducer needle was advanced. Needle position confirmed with CT. Guidewire inserted followed by tract dilatation to insert a 10 [place] drain. Unfortunately,, the drain catheter passed beneath the spleen and medial to the fluid collection. This catheter was cut and removed. At a more anterior intercostal space, an 18 gauge introducer needle was advanced into a large component of the fluid collection. Needle position confirmed with CT. Needle was directed cephalad to the subdiaphragmatic area. Guidewire inserted easily followed by tract dilatation insert a 10 French drain. Drain catheter position confirmed within the subdiaphragmatic abscess by CT. Syringe aspiration yielded 70 cc purulent fluid. Catheter secured with Prolene suture and a sterile dressing. Suction bulb connected. Sample sent for culture. Patient tolerated the procedure well. IMPRESSION: Successful CT-guided left upper quadrant abscess drain placement as above. Electronically Signed   By: Jerilynn Mages.  Shick M.D.   On: 07/03/2022 16:42    Anti-infectives: Anti-infectives (From admission, onward)    Start     Dose/Rate Route  Frequency Ordered Stop   06/26/22 0000   piperacillin-tazobactam (ZOSYN) IVPB 3.375 g        3.375 g 12.5 mL/hr over 240 Minutes Intravenous Every 8 hours 06/25/22 1832     06/25/22 1815  piperacillin-tazobactam (ZOSYN) IVPB 3.375 g        3.375 g 100 mL/hr over 30 Minutes Intravenous  Once 06/25/22 1802 06/25/22 1830   06/25/22 1756  piperacillin-tazobactam (ZOSYN) 3.375 GM/50ML IVPB       Note to Pharmacy: Ward, Christa K: cabinet override      06/25/22 1756 06/27/22 0333   06/25/22 1630  ceFAZolin (ANCEF) IVPB 2g/100 mL premix        2 g 200 mL/hr over 30 Minutes Intravenous  Once 06/25/22 1534 06/25/22 1640       Assessment/Plan: s/p Procedure(s): EXPLORATORY LAPAROTOMY, Wilder OUT,  ABDOMINAL CLOSURE (N/A) Wbc slowly improving. If this changes then will need repeat CT in next few days Continue IV abx and drain POD 10/8 s/p ex lap with SBR and washout for feculent peritonitis for SB perforation from SBO by Dr. Thermon Leyland  06/25/22 - Feculent peritonitis found in OR with 2 SB perforations noted from obstructive process.  Unclear etiology. Mother had something similar and wife states sister is having some GI issues, ? Need for further work up to rule out [employer] or other etiology - S/p IR perc drain 2/22. Cx's pending. Drain flushes per their team.  - Cont zosyn given intra-abdominal infection.  WBC up to 28K today. - Allow CLD today. Would not advance past this till more reliable bowel function - Continue bid wtd dressing changes - Oob, pulm toilet - Pathology on small bowel benign with significant luminal narrowing at one point - Mobilize, pulm toilet   FEN - CLD. TNA. IVFs per primary/TPN VTE - SCDs, Lovenox ID - Zosyn 2/13 --> Foley - out 2/21, voiding well   SPCM - TNA Post op acute respiratory failure - resolved  LOS: 9 days    Autumn Messing III 07/05/2022

## 2022-07-05 NOTE — Progress Notes (Signed)
PROGRESS NOTE    Gabriel Dalton  [id number] DOB: 1986-01-09 DOA: 06/24/2022 PCP: Marrian Salvage, FNP   Brief Narrative: 37 yo BM PMHx chronic abd pain, wt loss, bronchitis, multiple gastric ulcers, tobacco abuse, gallstones (for which he saw [facility] outpt 2/8 and chole was offered but not yet scheduled) presented to [facility] ED 2/13 with CC abd pain. followed by gastroenterology labouer as outpatient and has had extensive workup in past 3 years for similar presentation-he has had extensive workup including multiple CT scans, MRI, ultrasound, NM gastric emptying study, most recent endoscopy evaluation in August 2023 revealing esophagitis with no bleeding, gastritis and biopsy with mild nonspecific reactive gastropathy, he has had positive occult blood 05/31/2022 which was attributed to hemorrhoids.  He endorses about 60 pound weight loss since the symptoms started.  He was recently seen by general surgery on 06/19/2022 to discuss cholecystectomy given that he has a gallstone. Patient was taken to the OR on 06/25/2022-had numerous small bowel perforations and evacuation of 1 L of bowel contents.  Assessment & Plan:   Principal Problem:   Cholelithiasis Active Problems:   Pain of upper abdomen   Severe protein-calorie malnutrition (HCC)   Small bowel perforation (HCC)   Endotracheal tube present   Gallstones   Peritonitis ([place])   Endotracheally intubated   #1 sepsis due to peritonitis from small bowel perforation-on Zosyn.  Still with significant leukocytosis though finally starting to trend down.  Patient had a bowel movement and flatus 2/22 after suppository.   Starting him on clear liquids 2/23 by surgery.   CT abdomen 07/01/2022-suggestive of ileus in the setting of peritonitis.  Loculated left subdiaphragmatic fluid suspicious for abscess deforming the spleen.  Suspicious complicated postoperative fluid perihepatic.  Left greater than right pleural effusion and body wall  edema.  IR placed drain at the  LUQ 2/22  #2 small bowel perforation status postresection and reanastomosis Followed by general surgery.  #3 BPH difficult Foley placement placed by urology Foley was Dc'd 2/22 patient is able to urinate without any problem.  #4 multiple electrolyte abnormalities including hyponatremia, hypokalemia, hypomagnesemia and hypophosphatemia-on TPN Hypokalemia resolved. Sodium still mildly low at 131. Mag 1.7. Phosphorus 3.4  Nutrition Problem: Severe Malnutrition Etiology: chronic illness   Signs/Symptoms: severe fat depletion, severe muscle depletion, percent weight loss (22% weight loss in 11 months) Percent weight loss: 22 % (in 11 months)    Interventions: Refer to RD note for recommendations, Boost Breeze, MVI  Estimated body mass index is 16.09 kg/m as calculated from the following:   Height as of this encounter: '5\' 8"'$  (1.727 m).   Weight as of this encounter: 48 kg.  DVT prophylaxis: Lovenox Code Status: Full code Family Communication: Wife at bedside  disposition Plan:  Status is: Inpatient Remains inpatient appropriate because: On TPN status post peritonitis   Consultants:  General surgery PCCM  Procedures: Ex lap 06/25/2022 Intubation Extubated 06/28/2022 06/27/2022 OR for washout and abdominal closure Left upper quadrant drain 07/03/2022  Antimicrobials: Zosyn  Subjective: Complains of abdominal pain back pain  Objective: Vitals:   07/04/22 2104 07/05/22 0146 07/05/22 0525 07/05/22 0942  BP: 120/89 (!) 124/92 (!) 124/90 121/82  Pulse: (!) 103 100 98 (!) 103  Resp: '18 18 18 18  '$ Temp: 99 F (37.2 C) 98.3 F (36.8 C) 98.7 F (37.1 C) 98.1 F (36.7 C)  TempSrc: Oral Oral Oral Oral  SpO2: 100% 100% 100% 100%  Weight:      Height:  Intake/Output Summary (Last 24 hours) at 07/05/2022 1000 Last data filed at 07/05/2022 0600 Gross per 24 hour  Intake 3157.67 ml  Output 1260 ml  Net 1897.67 ml    Filed Weights    06/25/22 2053 06/26/22 1045 07/03/22 1014  Weight: 49.8 kg 48.3 kg 48 kg    Examination: General exam: Appears in no acute distress  respiratory system: Diminished at the bases  cardiovascular system: S1 & S2 heard, RRR. No JVD, murmurs, rubs, gallops or clicks. No pedal edema. Gastrointestinal system: Abdomen is distended, soft and tender. No organomegaly or masses felt. Normal bowel sounds heard.  Covered with dressing Left upper quadrant drain in place with cloudy looking drainage noted Central nervous system: Alert and oriented. No focal neurological deficits. Extremities: No edema  skin: No rashes, lesions or ulcers Psychiatry: Judgement and insight appear normal. Mood & affect appropriate.     Data Reviewed: I have personally reviewed following labs and imaging studies  CBC: Recent Labs  Lab 07/01/22 0351 07/02/22 0251 07/03/22 0334 07/04/22 0431 07/05/22 0335  WBC 19.6* 20.3* 26.4* 28.2* 25.4*  NEUTROABS 14.0* 14.2* 20.4* 22.7* 20.3*  HGB 10.8* 9.8* 8.9* 8.3* 8.3*  HCT 30.6* 27.8* 25.3* 24.1* 24.2*  MCV 81.8 81.3 82.1 83.4 82.9  PLT 461* 491* 547* 622* 826*    Basic Metabolic Panel: Recent Labs  Lab 07/01/22 0351 07/02/22 0251 07/03/22 0334 07/04/22 0431 07/05/22 0335  NA 131* 130* 132* 132* 131*  K 4.1 3.9 4.0 3.9 4.2  CL 97* 98 100 101 98  CO2 '24 23 22 23 24  '$ GLUCOSE 106* 112* 112* 117* 105*  BUN '10 11 12 12 12  '$ CREATININE 0.47* 0.49* 0.43* 0.43* 0.51*  CALCIUM 8.3* 8.1* 8.3* 8.2* 8.5*  MG 2.0 1.8 1.8 1.9 1.7  PHOS 2.8 3.0 2.9 3.0 3.4    GFR: Estimated Creatinine Clearance: 87.5 mL/min (A) (by C-G formula based on SCr of 0.51 mg/dL (L)). Liver Function Tests: Recent Labs  Lab 07/01/22 0351 07/02/22 0251 07/03/22 0334 07/04/22 0431 07/05/22 0335  AST 17 15 13* 14* 16  ALT '18 16 15 15 18  '$ ALKPHOS 74 73 77 98 135*  BILITOT 0.8 1.0 0.7 0.5 0.7  PROT 5.9* 5.9* 5.6* 6.2* 6.3*  ALBUMIN 2.2* 2.1* 2.0* 2.1* 2.3*    No results for input(s):  "LIPASE", "AMYLASE" in the last 168 hours. No results for input(s): "AMMONIA" in the last 168 hours. Coagulation Profile: Recent Labs  Lab 07/03/22 0334  INR 1.1    Cardiac Enzymes: No results for input(s): "CKTOTAL", "CKMB", "CKMBINDEX", "TROPONINI" in the last 168 hours. BNP (last 3 results) No results for input(s): "PROBNP" in the last 8760 hours. HbA1C: No results for input(s): "HGBA1C" in the last 72 hours. CBG: Recent Labs  Lab 07/02/22 1122 07/02/22 1533 07/03/22 0001 07/03/22 0711 07/03/22 1608  GLUCAP 104* 117* 112* 114* 105*    Lipid Profile: Recent Labs    07/03/22 0337  TRIG 170*    Thyroid Function Tests: No results for input(s): "TSH", "T4TOTAL", "FREET4", "T3FREE", "THYROIDAB" in the last 72 hours. Anemia Panel: No results for input(s): "VITAMINB12", "FOLATE", "FERRITIN", "TIBC", "IRON", "RETICCTPCT" in the last 72 hours.  Sepsis Labs: No results for input(s): "PROCALCITON", "LATICACIDVEN" in the last 168 hours.  Recent Results (from the past 240 hour(s))  MRSA Next Gen by PCR, Nasal     Status: None   Collection Time: 06/25/22 10:10 PM   Specimen: Nasal Mucosa; Nasal Swab  Result Value Ref Range Status  MRSA by PCR Next Gen NOT DETECTED NOT DETECTED Final    Comment: (NOTE) The GeneXpert MRSA Assay (FDA approved for NASAL specimens only), is one component of a comprehensive MRSA colonization surveillance program. It is not intended to diagnose MRSA infection nor to guide or monitor treatment for MRSA infections. Test performance is not FDA approved in patients less than 90 years old. Performed at [facility], [place] [address], [place], [place] [address]   Aerobic/Anaerobic Culture w Gram Stain (surgical/deep wound)     Status: None (Preliminary result)   Collection Time: 07/03/22  3:34 PM   Specimen: Abscess  Result Value Ref Range Status   Specimen Description   Final    ABSCESS Performed at [place] [address] [place], [place] [address]    Special Requests   Final    Normal Performed at [facility], [place] [address] [place], [place] [address]    Gram Stain PENDING  Incomplete   Culture   Final    NO GROWTH < 24 HOURS Performed at [facility], [place] [address], [place], [place] [address]    Report Status PENDING  Incomplete         Radiology Studies: CT GUIDED PERITONEAL/RETROPERITONEAL FLUID DRAIN BY PERC CATH  Result Date: 07/03/2022 INDICATION: Left upper quadrant perisplenic/subdiaphragmatic abscess EXAM: CT DRAINAGE LEFT UPPER QUADRANT ABSCESS Date:  07/03/2022 07/03/2022 4:03 pm Radiologist:  M. Daryll Brod, MD Guidance:  CT FLUOROSCOPY: None. MEDICATIONS: 1% lidocaine local ANESTHESIA/SEDATION: Moderate (conscious) sedation was employed during this procedure. A total of Versed 4.0 mg and Fentanyl 100 mcg was administered intravenously. Moderate Sedation Time: 28 minutes. The patient's level of consciousness and vital signs were monitored continuously by radiology nursing throughout the procedure under my direct supervision. CONTRAST:  None. COMPLICATIONS: None immediate. PROCEDURE: Informed consent was obtained from the patient following explanation of the procedure, risks, benefits and alternatives. The patient understands, agrees and consents for the procedure. All questions were addressed. A time out was performed. Maximal barrier sterile technique utilized including caps, mask, sterile gowns, sterile gloves, large sterile drape, hand hygiene, and ChloraPrep. Previous imaging reviewed. Patient position left side up decubitus. Noncontrast localization CT performed. The left upper quadrant fluid collection about the spleen and beneath the diaphragm was localized and marked for a posterior intercostal approach. Under sterile conditions and local anesthesia, an 18 gauge introducer needle was advanced. Needle position confirmed with CT. Guidewire  inserted followed by tract dilatation to insert a 10 [place] drain. Unfortunately,, the drain catheter passed beneath the spleen and medial to the fluid collection. This catheter was cut and removed. At a more anterior intercostal space, an 18 gauge introducer needle was advanced into a large component of the fluid collection. Needle position confirmed with CT. Needle was directed cephalad to the subdiaphragmatic area. Guidewire inserted easily followed by tract dilatation insert a 10 French drain. Drain catheter position confirmed within the subdiaphragmatic abscess by CT. Syringe aspiration yielded 70 cc purulent fluid. Catheter secured with Prolene suture and a sterile dressing. Suction bulb connected. Sample sent for culture. Patient tolerated the procedure well. IMPRESSION: Successful CT-guided left upper quadrant abscess drain placement as above. Electronically Signed   By: Jerilynn Mages.  Shick M.D.   On: 07/03/2022 16:42        Scheduled Meds:  acetaminophen  1,000 mg Oral Q6H   Chlorhexidine Gluconate Cloth  6 each Topical Q0600   enoxaparin (LOVENOX) injection  40 mg Subcutaneous  Daily   fentaNYL  1 patch Transdermal Q72H   fluticasone  1 spray Each Nare Daily   pantoprazole (PROTONIX) IV  40 mg Intravenous Q24H   simethicone  80 mg Oral QID   sodium chloride flush  10-40 mL Intracatheter Q12H   sodium chloride flush  5 mL Intracatheter Q8H   Continuous Infusions:  sodium chloride Stopped (06/27/22 0832)   sodium chloride 60 mL/hr at 07/04/22 1645   magnesium sulfate bolus IVPB     methocarbamol (ROBAXIN) IV 1,000 mg (07/05/22 0141)   methocarbamol (ROBAXIN) IV 500 mg (07/02/22 2048)   piperacillin-tazobactam 3.375 g (07/05/22 0837)   TPN ADULT (ION) 80 mL/hr at 07/04/22 1746   TPN ADULT (ION)       LOS: 9 days    Time spent: 39 min  Georgette Shell, MD  07/05/2022, 10:00 AM

## 2022-07-05 NOTE — Progress Notes (Signed)
PHARMACY - TOTAL PARENTERAL NUTRITION CONSULT NOTE   Indication: malnutrition, bowel perforation, expected prolonged ileus  Patient Measurements: Height: '5\' 8"'$  (172.7 cm) Weight: 48 kg (105 lb 13.1 oz) IBW/kg (Calculated) : 68.4 TPN AdjBW (KG): 49.8 Body mass index is 16.09 kg/m.  Assessment: 40 yoM admitted with abdominal pain, weight loss.  Underwent laparoscopic cholecystectomy on 2/14 and was found with multiple bowel perforations and stool contamination of abdomen, converted to exploratory laparotomy, with small bowel resection and reanastomosis, and temporary abdominal closure.  TPN started on 06/25/22.    Glucose / Insulin: No noted hx DM.  A1c 5.8. Goal BG <150 - CBGs: wnl Electrolytes:  - Na remains low at 131 with max conc Na in TPN; Magnesium 1.7; Phos is wnl but trending up - other lytes wnl including CorrCa - Goal for ileus: Mag > 2, K > 4, Phos ~3 Renal: SCr < 1 - Difficult urethral catheterization performed by urology on 2/14 Hepatic: LFTs wnl; Alk Phos up 135 - Albumin low - Propofol 2/14 -  2/15  - TG: 361 (2/19), 211 (2/21), 170 ( 2/22)  Intake / Output; MIVF: NS '@60'$  ml/hr  - I/O: +2355 mL - good UOP GI Imaging: - 2/20 Abd CT: Findings more suggestive of ileus in the setting of peritonitis. Loculated LEFT subdiaphragmatic fluid suspicious for abscess deforming the spleen. GI Surgeries / Procedures:  2/14 OR: small bowel resection and reanastomosis, open abdomen 2/16 OR: exp lap, washout and abdominal closure   Central access: Double lumen PICC placed 2/15 TPN start date: 2/15  Nutritional Goals: Goal TPN rate is 80 mL/hr to provide 96 g of protein and 2039 kcals per day.  - Thiamine '100mg'$  in TPN x5 days (2/15 - 2/19)  RD Assessment: Estimated Needs Total Energy Estimated Needs: 1950-2100 kcals Total Protein Estimated Needs: 90-100 grams Total Fluid Estimated Needs: >/= 1.9L  Current Nutrition:  NPO TPN   Plan:   Now: - magnesium sulfate 2 gm  IV x1  At 1800:  Continue TPN at goal rate of 80 mL/hr Provides 96 g protein, 2039 kcal, 345 g dextrose   Electrolytes in TPN:  Na 150 mEq/L K 50 mEq/L Ca 64mq/L continue Mg to 7 mEq/L Decrease Phos to 15 mmol/L.  Cl:Ac max Cl  Add standard MVI and trace elements to TPN mIVF: NS at 60 ml/hr Monitor TPN labs on Mon/Thurs.  MD ordered phos and mag daily CMET on 2/25   ADia Sitter PharmD, BCPS 07/05/2022 8:50 AM

## 2022-07-06 DIAGNOSIS — R1010 Upper abdominal pain, unspecified: Secondary | ICD-10-CM | POA: Diagnosis not present

## 2022-07-06 LAB — MAGNESIUM: Magnesium: 2.2 mg/dL (ref 1.7–2.4)

## 2022-07-06 LAB — CBC WITH DIFFERENTIAL/PLATELET
Abs Immature Granulocytes: 0.41 10*3/uL — ABNORMAL HIGH (ref 0.00–0.07)
Basophils Absolute: 0.1 10*3/uL (ref 0.0–0.1)
Basophils Relative: 0 %
Eosinophils Absolute: 0.2 10*3/uL (ref 0.0–0.5)
Eosinophils Relative: 1 %
HCT: 23.4 % — ABNORMAL LOW (ref 39.0–52.0)
Hemoglobin: 8.1 g/dL — ABNORMAL LOW (ref 13.0–17.0)
Immature Granulocytes: 2 %
Lymphocytes Relative: 10 %
Lymphs Abs: 2.1 10*3/uL (ref 0.7–4.0)
MCH: 28.8 pg (ref 26.0–34.0)
MCHC: 34.6 g/dL (ref 30.0–36.0)
MCV: 83.3 fL (ref 80.0–100.0)
Monocytes Absolute: 1.7 10*3/uL — ABNORMAL HIGH (ref 0.1–1.0)
Monocytes Relative: 8 %
Neutro Abs: 17.3 10*3/uL — ABNORMAL HIGH (ref 1.7–7.7)
Neutrophils Relative %: 79 %
Platelets: 991 10*3/uL (ref 150–400)
RBC: 2.81 MIL/uL — ABNORMAL LOW (ref 4.22–5.81)
RDW: 15.6 % — ABNORMAL HIGH (ref 11.5–15.5)
WBC: 21.7 10*3/uL — ABNORMAL HIGH (ref 4.0–10.5)
nRBC: 0 % (ref 0.0–0.2)

## 2022-07-06 LAB — COMPREHENSIVE METABOLIC PANEL
ALT: 17 U/L (ref 0–44)
AST: 15 U/L (ref 15–41)
Albumin: 2.2 g/dL — ABNORMAL LOW (ref 3.5–5.0)
Alkaline Phosphatase: 133 U/L — ABNORMAL HIGH (ref 38–126)
Anion gap: 8 (ref 5–15)
BUN: 12 mg/dL (ref 6–20)
CO2: 24 mmol/L (ref 22–32)
Calcium: 8.4 mg/dL — ABNORMAL LOW (ref 8.9–10.3)
Chloride: 100 mmol/L (ref 98–111)
Creatinine, Ser: 0.48 mg/dL — ABNORMAL LOW (ref 0.61–1.24)
GFR, Estimated: >60 mL/min (ref >60)
Glucose, Bld: 107 mg/dL — ABNORMAL HIGH (ref 70–99)
Potassium: 4.1 mmol/L (ref 3.5–5.1)
Sodium: 132 mmol/L — ABNORMAL LOW (ref 135–145)
Total Bilirubin: 0.8 mg/dL (ref 0.3–1.2)
Total Protein: 6.1 g/dL — ABNORMAL LOW (ref 6.5–8.1)

## 2022-07-06 LAB — PHOSPHORUS: Phosphorus: 3.2 mg/dL (ref 2.5–4.6)

## 2022-07-06 MED ORDER — TRAVASOL 10 % IV SOLN
INTRAVENOUS | Status: AC
  Filled 2022-07-06: qty 960

## 2022-07-06 MED ORDER — BISACODYL 10 MG RE SUPP
10.0000 mg | Freq: Once | RECTAL | Status: AC
  Administered 2022-07-06: 10 mg via RECTAL
  Filled 2022-07-06: qty 1

## 2022-07-06 NOTE — Progress Notes (Signed)
9 Days Post-Op   Subjective/Chief Complaint: Complains of worse abd pain since drain was put in. Wbc declining   Objective: Vital signs in last 24 hours: Temp:  [98.4 F (36.9 C)-98.9 F (37.2 C)] 98.9 F (37.2 C) (02/25 0602) Pulse Rate:  [95-104] 101 (02/25 0602) Resp:  [16-19] 16 (02/25 0602) BP: (123-132)/(83-90) 126/90 (02/25 0602) SpO2:  [99 %-100 %] 100 % (02/25 0602) Last BM Date : 07/03/22  Intake/Output from previous day: 02/24 0701 - 02/25 0700 In: 4176.3 [P.O.:750; I.V.:2881.1; IV Piggyback:530.2] Out: [id number] [Urine:1700; Drains:35] Intake/Output this shift: Total I/O In: 120 [P.O.:120] Out: 555 [Urine:550; Drains:5]  General appearance: alert and cooperative Resp: clear to auscultation bilaterally Cardio: regular rate and rhythm GI: soft, moderate lower tenderness. Wound clean. Small port site incision in epigastric area draining pus- evacuated  Lab Results:  Recent Labs    07/05/22 0335 07/06/22 0301  WBC 25.4* 21.7*  HGB 8.3* 8.1*  HCT 24.2* 23.4*  PLT 826* 991*   BMET Recent Labs    07/05/22 0335 07/06/22 0301  NA 131* 132*  K 4.2 4.1  CL 98 100  CO2 24 24  GLUCOSE 105* 107*  BUN 12 12  CREATININE 0.51* 0.48*  CALCIUM 8.5* 8.4*   PT/INR No results for input(s): "LABPROT", "INR" in the last 72 hours. ABG No results for input(s): "PHART", "HCO3" in the last 72 hours.  Invalid input(s): "PCO2", "PO2"  Studies/Results: No results found.  Anti-infectives: Anti-infectives (From admission, onward)    Start     Dose/Rate Route Frequency Ordered Stop   06/26/22 0000  piperacillin-tazobactam (ZOSYN) IVPB 3.375 g        3.375 g 12.5 mL/hr over 240 Minutes Intravenous Every 8 hours 06/25/22 1832     06/25/22 1815  piperacillin-tazobactam (ZOSYN) IVPB 3.375 g        3.375 g 100 mL/hr over 30 Minutes Intravenous  Once 06/25/22 1802 06/25/22 1830   06/25/22 1756  piperacillin-tazobactam (ZOSYN) 3.375 GM/50ML IVPB       Note to Pharmacy:  Ward, Christa K: cabinet override      06/25/22 1756 06/27/22 0333   06/25/22 1630  ceFAZolin (ANCEF) IVPB 2g/100 mL premix        2 g 200 mL/hr over 30 Minutes Intravenous  Once 06/25/22 1534 06/25/22 1640       Assessment/Plan: s/p Procedure(s): EXPLORATORY LAPAROTOMY, [place] OUT,  ABDOMINAL CLOSURE (N/A) Continue IV abx and drain POD 11/9 s/p ex lap with SBR and washout for feculent peritonitis for SB perforation from SBO by Dr. Thermon Leyland  06/25/22 - Feculent peritonitis found in OR with 2 SB perforations noted from obstructive process.  Unclear etiology. Mother had something similar and wife states sister is having some GI issues, ? Need for further work up to rule out [employer] or other etiology - S/p IR perc drain 2/22. Cx's pending. Drain flushes per their team.  - Cont zosyn given intra-abdominal infection.  WBC down to 21 today - Allow CLD today. Would not advance past this till more reliable bowel function - Continue bid wtd dressing changes - Oob, pulm toilet - Pathology on small bowel benign with significant luminal narrowing at one point - Mobilize, pulm toilet   FEN - CLD. TNA. IVFs per primary/TPN VTE - SCDs, Lovenox ID - Zosyn 2/13 --> Foley - out 2/21, voiding well   SPCM - TNA Post op acute respiratory failure - resolved  LOS: 10 days    Autumn Messing III 07/06/2022

## 2022-07-06 NOTE — Progress Notes (Signed)
Received critical lab result platelets-[id number], Notified on call A. Chavez,NP via secure chat.

## 2022-07-06 NOTE — Progress Notes (Signed)
PHARMACY - TOTAL PARENTERAL NUTRITION CONSULT NOTE   Indication: malnutrition, bowel perforation, expected prolonged ileus  Patient Measurements: Height: '5\' 8"'$  (172.7 cm) Weight: 48 kg (105 lb 13.1 oz) IBW/kg (Calculated) : 68.4 TPN AdjBW (KG): 49.8 Body mass index is 16.09 kg/m.  Assessment: 51 yoM admitted with abdominal pain, weight loss.  Underwent laparoscopic cholecystectomy on 2/14 and was found with multiple bowel perforations and stool contamination of abdomen, converted to exploratory laparotomy, with small bowel resection and reanastomosis, and temporary abdominal closure.  TPN started on 06/25/22.    Glucose / Insulin: No noted hx DM.  A1c 5.8. Goal BG <150 - CBGs: wnl Electrolytes:  - Na remains low at 132 with max conc Na in TPN - other lytes wnl including CorrCa - Goal for ileus: Mag > 2, K > 4, Phos ~3 Renal: SCr < 1 - Difficult urethral catheterization performed by urology on 2/14 Hepatic: LFTs wnl; Alk Phos elevated at 133 - Albumin low - Propofol 2/14 -  2/15  - TG: 361 (2/19), 211 (2/21), 170 ( 2/22)  Intake / Output; MIVF: NS '@60'$  ml/hr  - I/O: +2441 mL - good UOP GI Imaging: - 2/20 Abd CT: Findings more suggestive of ileus in the setting of peritonitis. Loculated LEFT subdiaphragmatic fluid suspicious for abscess deforming the spleen. GI Surgeries / Procedures:  2/14 OR: small bowel resection and reanastomosis, open abdomen 2/16 OR: exp lap, washout and abdominal closure   Central access: Double lumen PICC placed 2/15 TPN start date: 2/15  Nutritional Goals: Goal TPN rate is 80 mL/hr to provide 96 g of protein and 2039 kcals per day.  - Thiamine '100mg'$  in TPN x5 days (2/15 - 2/19)  RD Assessment: Estimated Needs Total Energy Estimated Needs: 1950-2100 kcals Total Protein Estimated Needs: 90-100 grams Total Fluid Estimated Needs: >/= 1.9L  Current Nutrition:  NPO TPN   Plan:   At 1800:  Continue TPN at goal rate of 80 mL/hr Provides 96 g  protein, 2039 kcal, 345 g dextrose   Electrolytes in TPN:  Na 150 mEq/L K 50 mEq/L Ca 55mq/L Mg 7 mEq/L Phos 15 mmol/L.  Cl:Ac max Cl  Add standard MVI and trace elements to TPN mIVF: NS at 60 ml/hr Monitor TPN labs on Mon/Thurs.  MD ordered phos and mag daily   ADia Sitter PharmD, BCPS 07/06/2022 8:51 AM

## 2022-07-06 NOTE — Progress Notes (Signed)
PROGRESS NOTE    Gabriel Dalton  [id number] DOB: [birth year] DOA: 06/24/2022 PCP: Marrian Salvage, FNP   Brief Narrative: 37 yo BM PMHx chronic abd pain, wt loss, bronchitis, multiple gastric ulcers, tobacco abuse, gallstones (for which he saw [facility] outpt 2/8 and chole was offered but not yet scheduled) presented to [facility] ED 2/13 with CC abd pain. followed by gastroenterology labouer as outpatient and has had extensive workup in past 3 years for similar presentation-he has had extensive workup including multiple CT scans, MRI, ultrasound, NM gastric emptying study, most recent endoscopy evaluation in August 2023 revealing esophagitis with no bleeding, gastritis and biopsy with mild nonspecific reactive gastropathy, he has had positive occult blood 05/31/2022 which was attributed to hemorrhoids.  He endorses about 60 pound weight loss since the symptoms started.  He was recently seen by general surgery on 06/19/2022 to discuss cholecystectomy given that he has a gallstone. Patient was taken to the OR on 06/25/2022-had numerous small bowel perforations and evacuation of 1 L of bowel contents.  Assessment & Plan:   Principal Problem:   Cholelithiasis Active Problems:   Pain of upper abdomen   Severe protein-calorie malnutrition (HCC)   Small bowel perforation (HCC)   Endotracheal tube present   Gallstones   Peritonitis ([place])   Endotracheally intubated   #1 sepsis due to peritonitis from small bowel perforation-on Zosyn.  Still with significant leukocytosis though finally starting to trend down.  Patient had a bowel movement and flatus 2/22 after suppository.   2/25 no bm or flatus Starting him on clear liquids 2/23 by surgery.   CT abdomen 07/01/2022-suggestive of ileus in the setting of peritonitis.  Loculated left subdiaphragmatic fluid suspicious for abscess deforming the spleen.  Suspicious complicated postoperative fluid perihepatic.  Left greater than right pleural effusion  and body wall edema.  IR placed drain at the  LUQ 2/22  #2 small bowel perforation status postresection and reanastomosis Followed by general surgery.  #3 BPH difficult Foley placement placed by urology Foley was Dc'd 2/22 patient is able to urinate without any problem.  #4 multiple electrolyte abnormalities including hyponatremia, hypokalemia, hypomagnesemia and hypophosphatemia-on TPN Hypokalemia resolved. Sodium still mildly low at 132 Mag 2.2 Phosphorus 3.2  #5 thrombocytosis likely reactive on Lovenox Patient cannot tolerate aspirin due to stomach ulcers will not start aspirin  Nutrition Problem: Severe Malnutrition Etiology: chronic illness   Signs/Symptoms: severe fat depletion, severe muscle depletion, percent weight loss (22% weight loss in 11 months) Percent weight loss: 22 % (in 11 months)    Interventions: Refer to RD note for recommendations, Boost Breeze, MVI  Estimated body mass index is 16.09 kg/m as calculated from the following:   Height as of this encounter: '5\' 8"'$  (1.727 m).   Weight as of this encounter: 48 kg.  DVT prophylaxis: Lovenox Code Status: Full code Family Communication: Wife at bedside  disposition Plan:  Status is: Inpatient Remains inpatient appropriate because: On TPN status post peritonitis   Consultants:  General surgery PCCM  Procedures: Ex lap 06/25/2022 Intubation Extubated 06/28/2022 06/27/2022 OR for washout and abdominal closure Left upper quadrant drain 07/03/2022  Antimicrobials: Zosyn  Subjective:  C/o abdominal pain middle incision and at the site of the drain no bowel movements or flatus  Objective: Vitals:   07/05/22 0942 07/05/22 1403 07/05/22 2052 07/06/22 0602  BP: 121/82 132/83 123/88 (!) 126/90  Pulse: (!) 103 (!) 104 95 (!) 101  Resp: '18 19 17 16  '$ Temp: 98.1 F (36.7  C) 98.4 F (36.9 C) 98.9 F (37.2 C) 98.9 F (37.2 C)  TempSrc: Oral Oral Oral Oral  SpO2: 100% 100% 99% 100%  Weight:      Height:         Intake/Output Summary (Last 24 hours) at 07/06/2022 1217 Last data filed at 07/06/2022 1000 Gross per 24 hour  Intake 3593.18 ml  Output 2290 ml  Net 1303.18 ml    Filed Weights   06/25/22 2053 06/26/22 1045 07/03/22 1014  Weight: 49.8 kg 48.3 kg 48 kg    Examination: General exam: Appears in distress  respiratory system: Diminished at the bases  cardiovascular system: S1 & S2 heard, RRR. No JVD, murmurs, rubs, gallops or clicks. No pedal edema. Gastrointestinal system: Abdomen is distended, soft and tender. No organomegaly or masses felt. Normal bowel sounds heard.  Covered with dressing Left upper quadrant drain in place with cloudy looking drainage noted Central nervous system: Alert and oriented. No focal neurological deficits. Extremities: No edema  skin: No rashes, lesions or ulcers Psychiatry: Judgement and insight appear normal. Mood & affect appropriate.     Data Reviewed: I have personally reviewed following labs and imaging studies  CBC: Recent Labs  Lab 07/02/22 0251 07/03/22 0334 07/04/22 0431 07/05/22 0335 07/06/22 0301  WBC 20.3* 26.4* 28.2* 25.4* 21.7*  NEUTROABS 14.2* 20.4* 22.7* 20.3* 17.3*  HGB 9.8* 8.9* 8.3* 8.3* 8.1*  HCT 27.8* 25.3* 24.1* 24.2* 23.4*  MCV 81.3 82.1 83.4 82.9 83.3  PLT 491* 547* 622* 826* 991*    Basic Metabolic Panel: Recent Labs  Lab 07/02/22 0251 07/03/22 0334 07/04/22 0431 07/05/22 0335 07/06/22 0301  NA 130* 132* 132* 131* 132*  K 3.9 4.0 3.9 4.2 4.1  CL 98 100 101 98 100  CO2 '23 22 23 24 24  '$ GLUCOSE 112* 112* 117* 105* 107*  BUN '11 12 12 12 12  '$ CREATININE 0.49* 0.43* 0.43* 0.51* 0.48*  CALCIUM 8.1* 8.3* 8.2* 8.5* 8.4*  MG 1.8 1.8 1.9 1.7 2.2  PHOS 3.0 2.9 3.0 3.4 3.2    GFR: Estimated Creatinine Clearance: 87.5 mL/min (A) (by C-G formula based on SCr of 0.48 mg/dL (L)). Liver Function Tests: Recent Labs  Lab 07/02/22 0251 07/03/22 0334 07/04/22 0431 07/05/22 0335 07/06/22 0301  AST 15 13* 14* 16  15  ALT '16 15 15 18 17  '$ ALKPHOS 73 77 98 135* 133*  BILITOT 1.0 0.7 0.5 0.7 0.8  PROT 5.9* 5.6* 6.2* 6.3* 6.1*  ALBUMIN 2.1* 2.0* 2.1* 2.3* 2.2*    No results for input(s): "LIPASE", "AMYLASE" in the last 168 hours. No results for input(s): "AMMONIA" in the last 168 hours. Coagulation Profile: Recent Labs  Lab 07/03/22 0334  INR 1.1    Cardiac Enzymes: No results for input(s): "CKTOTAL", "CKMB", "CKMBINDEX", "TROPONINI" in the last 168 hours. BNP (last 3 results) No results for input(s): "PROBNP" in the last 8760 hours. HbA1C: No results for input(s): "HGBA1C" in the last 72 hours. CBG: Recent Labs  Lab 07/02/22 1122 07/02/22 1533 07/03/22 0001 07/03/22 0711 07/03/22 1608  GLUCAP 104* 117* 112* 114* 105*    Lipid Profile: No results for input(s): "CHOL", "HDL", "LDLCALC", "TRIG", "CHOLHDL", "LDLDIRECT" in the last 72 hours.  Thyroid Function Tests: No results for input(s): "TSH", "T4TOTAL", "FREET4", "T3FREE", "THYROIDAB" in the last 72 hours. Anemia Panel: No results for input(s): "VITAMINB12", "FOLATE", "FERRITIN", "TIBC", "IRON", "RETICCTPCT" in the last 72 hours.  Sepsis Labs: No results for input(s): "PROCALCITON", "LATICACIDVEN" in the last 168 hours.  Recent Results (from the past 240 hour(s))  Aerobic/Anaerobic Culture w Gram Stain (surgical/deep wound)     Status: None (Preliminary result)   Collection Time: 07/03/22  3:34 PM   Specimen: Abscess  Result Value Ref Range Status   Specimen Description   Final    ABSCESS Performed at [facility] [address] [place], [place] [address]    Special Requests   Final    Normal Performed at [facility], [place] [address], [place], [place] [address]    Gram Stain   Final    RARE WBC PRESENT,BOTH PMN AND MONONUCLEAR RARE BUDDING YEAST SEEN Performed at [facility], [place] [address] [place],  [address]    Culture   Final    CULTURE REINCUBATED FOR BETTER  GROWTH NO ANAEROBES ISOLATED; CULTURE IN PROGRESS FOR 5 DAYS    Report Status PENDING  Incomplete         Radiology Studies: No results found.      Scheduled Meds:  acetaminophen  1,000 mg Oral Q6H   Chlorhexidine Gluconate Cloth  6 each Topical Q0600   enoxaparin (LOVENOX) injection  40 mg Subcutaneous Daily   fentaNYL  1 patch Transdermal Q72H   fluticasone  1 spray Each Nare Daily   pantoprazole (PROTONIX) IV  40 mg Intravenous Q24H   simethicone  80 mg Oral QID   sodium chloride flush  10-40 mL Intracatheter Q12H   sodium chloride flush  5 mL Intracatheter Q8H   Continuous Infusions:  sodium chloride Stopped (06/27/22 0832)   sodium chloride 60 mL/hr at 07/05/22 1052   methocarbamol (ROBAXIN) IV 1,000 mg (07/06/22 0925)   methocarbamol (ROBAXIN) IV 500 mg (07/02/22 2048)   piperacillin-tazobactam 3.375 g (07/06/22 0709)   TPN ADULT (ION) 80 mL/hr at 07/05/22 1754   TPN ADULT (ION)       LOS: 10 days    Time spent: 39 min  Georgette Shell, MD  07/06/2022, 12:17 PM

## 2022-07-07 DIAGNOSIS — K651 Peritoneal abscess: Secondary | ICD-10-CM | POA: Diagnosis not present

## 2022-07-07 DIAGNOSIS — R1010 Upper abdominal pain, unspecified: Secondary | ICD-10-CM | POA: Diagnosis not present

## 2022-07-07 LAB — CBC WITH DIFFERENTIAL/PLATELET
Abs Immature Granulocytes: 0.23 10*3/uL — ABNORMAL HIGH (ref 0.00–0.07)
Basophils Absolute: 0.1 10*3/uL (ref 0.0–0.1)
Basophils Relative: 0 %
Eosinophils Absolute: 0.2 10*3/uL (ref 0.0–0.5)
Eosinophils Relative: 1 %
HCT: 23 % — ABNORMAL LOW (ref 39.0–52.0)
Hemoglobin: 7.8 g/dL — ABNORMAL LOW (ref 13.0–17.0)
Immature Granulocytes: 1 %
Lymphocytes Relative: 9 %
Lymphs Abs: 1.6 10*3/uL (ref 0.7–4.0)
MCH: 28.8 pg (ref 26.0–34.0)
MCHC: 33.9 g/dL (ref 30.0–36.0)
MCV: 84.9 fL (ref 80.0–100.0)
Monocytes Absolute: 1.7 10*3/uL — ABNORMAL HIGH (ref 0.1–1.0)
Monocytes Relative: 9 %
Neutro Abs: 15.1 10*3/uL — ABNORMAL HIGH (ref 1.7–7.7)
Neutrophils Relative %: 80 %
Platelets: 1070 10*3/uL (ref 150–400)
RBC: 2.71 MIL/uL — ABNORMAL LOW (ref 4.22–5.81)
RDW: 15.7 % — ABNORMAL HIGH (ref 11.5–15.5)
WBC: 18.8 10*3/uL — ABNORMAL HIGH (ref 4.0–10.5)
nRBC: 0 % (ref 0.0–0.2)

## 2022-07-07 LAB — TRIGLYCERIDES: Triglycerides: 119 mg/dL (ref <150)

## 2022-07-07 LAB — PHOSPHORUS: Phosphorus: 3 mg/dL (ref 2.5–4.6)

## 2022-07-07 LAB — MAGNESIUM: Magnesium: 1.6 mg/dL — ABNORMAL LOW (ref 1.7–2.4)

## 2022-07-07 MED ORDER — FLUCONAZOLE IN SODIUM CHLORIDE 400-0.9 MG/200ML-% IV SOLN
400.0000 mg | INTRAVENOUS | Status: DC
  Administered 2022-07-07 – 2022-07-11 (×5): 400 mg via INTRAVENOUS
  Filled 2022-07-07 (×5): qty 200

## 2022-07-07 MED ORDER — MAGNESIUM SULFATE 2 GM/50ML IV SOLN
2.0000 g | Freq: Once | INTRAVENOUS | Status: AC
  Administered 2022-07-07: 2 g via INTRAVENOUS
  Filled 2022-07-07: qty 50

## 2022-07-07 MED ORDER — TRAVASOL 10 % IV SOLN
INTRAVENOUS | Status: AC
  Filled 2022-07-07: qty 960

## 2022-07-07 MED ORDER — MAGNESIUM SULFATE IN D5W 1-5 GM/100ML-% IV SOLN
1.0000 g | Freq: Once | INTRAVENOUS | Status: AC
  Administered 2022-07-07: 1 g via INTRAVENOUS
  Filled 2022-07-07: qty 100

## 2022-07-07 NOTE — Progress Notes (Signed)
Referring Physician(s): Byerly,F  Supervising Physician: Michaelle Birks  Patient Status:  [facility] - In-pt  Chief Complaint: abdominal/back pain, left upper abdominal fluid collection    Subjective: Pt without acute changes; has ambulated today, afebrile   Allergies: Aspirin  Medications: Prior to Admission medications   Medication Sig Start Date End Date Taking? Authorizing Provider  acetaminophen (TYLENOL) 500 MG tablet Take 1,000 mg by mouth as needed for moderate pain.   Yes [provider]  ciprofloxacin (CIPRO) 500 MG tablet Take 500 mg by mouth 2 (two) times daily. 06/20/22  Yes [provider]  dicyclomine (BENTYL) 20 MG tablet Take 1 tablet (20 mg total) by mouth 2 (two) times daily. Patient taking differently: Take 20 mg by mouth daily as needed for spasms. 05/28/22  Yes Zehr, Laban Emperor, PA-C  linaclotide (LINZESS) 145 MCG CAPS capsule Take 1 capsule (145 mcg total) by mouth daily before breakfast. Patient taking differently: Take 145 mcg by mouth as needed (constipation). 04/23/22  Yes Zehr, Laban Emperor, PA-C  pantoprazole (PROTONIX) 40 MG tablet Take 1 tablet (40 mg total) by mouth 2 (two) times daily. 04/23/22  Yes Zehr, Laban Emperor, PA-C  sucralfate (CARAFATE) 1 g tablet Take 1 tablet (1 g total) by mouth 4 (four) times daily -  with meals and at bedtime. Patient taking differently: Take 1 g by mouth as needed (heartburn). 05/28/22  Yes Zehr, Laban Emperor, PA-C  tamsulosin (FLOMAX) 0.4 MG CAPS capsule Take 0.4 mg by mouth daily. 06/20/22  Yes [provider]  ketorolac (TORADOL) 10 MG tablet Take 10 mg by mouth every 6 (six) hours as needed. Patient not taking: Reported on 06/25/2022 06/20/22   [provider]  oxyCODONE-acetaminophen (PERCOCET/ROXICET) 5-325 MG tablet Take 1 tablet by mouth every 6 (six) hours as needed for severe pain. Patient not taking: Reported on 06/25/2022 05/18/22   Rex Kras, PA  promethazine (PHENERGAN) 25 MG tablet Take 1  tablet (25 mg total) by mouth every 6 (six) hours as needed for nausea or vomiting. Patient not taking: Reported on 06/25/2022 05/18/22   Rex Kras, PA     Vital Signs: BP (!) 132/94 (BP Location: Left Arm)   Pulse (!) 103   Temp 98.5 F (36.9 C) (Oral)   Resp 17   Ht '5\' 8"'$  (1.727 m)   Wt 105 lb 13.1 oz (48 kg)   SpO2 98%   BMI 16.09 kg/m   Physical Exam ; awake, alert.  Left upper abdominal drain intact, insertion site mildly tender, output 35 cc turbid beige colored fluid; drain flushed without difficulty earlier today  Imaging: CT GUIDED PERITONEAL/RETROPERITONEAL FLUID DRAIN BY PERC CATH  Result Date: 07/03/2022 INDICATION: Left upper quadrant perisplenic/subdiaphragmatic abscess EXAM: CT DRAINAGE LEFT UPPER QUADRANT ABSCESS Date:  07/03/2022 07/03/2022 4:03 pm Radiologist:  M. Daryll Brod, MD Guidance:  CT FLUOROSCOPY: None. MEDICATIONS: 1% lidocaine local ANESTHESIA/SEDATION: Moderate (conscious) sedation was employed during this procedure. A total of Versed 4.0 mg and Fentanyl 100 mcg was administered intravenously. Moderate Sedation Time: 28 minutes. The patient's level of consciousness and vital signs were monitored continuously by radiology nursing throughout the procedure under my direct supervision. CONTRAST:  None. COMPLICATIONS: None immediate. PROCEDURE: Informed consent was obtained from the patient following explanation of the procedure, risks, benefits and alternatives. The patient understands, agrees and consents for the procedure. All questions were addressed. A time out was performed. Maximal barrier sterile technique utilized including caps, mask, sterile gowns, sterile gloves, large sterile drape, hand  hygiene, and ChloraPrep. Previous imaging reviewed. Patient position left side up decubitus. Noncontrast localization CT performed. The left upper quadrant fluid collection about the spleen and beneath the diaphragm was localized and marked for a posterior intercostal approach.  Under sterile conditions and local anesthesia, an 18 gauge introducer needle was advanced. Needle position confirmed with CT. Guidewire inserted followed by tract dilatation to insert a 10 [place] drain. Unfortunately,, the drain catheter passed beneath the spleen and medial to the fluid collection. This catheter was cut and removed. At a more anterior intercostal space, an 18 gauge introducer needle was advanced into a large component of the fluid collection. Needle position confirmed with CT. Needle was directed cephalad to the subdiaphragmatic area. Guidewire inserted easily followed by tract dilatation insert a 10 French drain. Drain catheter position confirmed within the subdiaphragmatic abscess by CT. Syringe aspiration yielded 70 cc purulent fluid. Catheter secured with Prolene suture and a sterile dressing. Suction bulb connected. Sample sent for culture. Patient tolerated the procedure well. IMPRESSION: Successful CT-guided left upper quadrant abscess drain placement as above. Electronically Signed   By: Jerilynn Mages.  Shick M.D.   On: 07/03/2022 16:42    Labs:  CBC: Recent Labs    07/04/22 0431 07/05/22 0335 07/06/22 0301 07/07/22 0305  WBC 28.2* 25.4* 21.7* 18.8*  HGB 8.3* 8.3* 8.1* 7.8*  HCT 24.1* 24.2* 23.4* 23.0*  PLT 622* 826* 991* 1,070*    COAGS: Recent Labs    07/03/22 0334  INR 1.1    BMP: Recent Labs    07/03/22 0334 07/04/22 0431 07/05/22 0335 07/06/22 0301  NA 132* 132* 131* 132*  K 4.0 3.9 4.2 4.1  CL 100 101 98 100  CO2 '22 23 24 24  '$ GLUCOSE 112* 117* 105* 107*  BUN '12 12 12 12  '$ CALCIUM 8.3* 8.2* 8.5* 8.4*  CREATININE 0.43* 0.43* 0.51* 0.48*  GFRNONAA >60 >60 >60 >60    LIVER FUNCTION TESTS: Recent Labs    07/03/22 0334 07/04/22 0431 07/05/22 0335 07/06/22 0301  BILITOT 0.7 0.5 0.7 0.8  AST 13* 14* 16 15  ALT '15 15 18 17  '$ ALKPHOS 77 98 135* 133*  PROT 5.6* 6.2* 6.3* 6.1*  ALBUMIN 2.0* 2.1* 2.3* 2.2*    Assessment and Plan: Pt  s/p ex lap with SBR  and washout for feculent peritonitis for SB perforation from SBO by Dr. Thermon Leyland  06/25/22 ; s/p LUQ perisplenic fluid collection drain 07/03/22; afebrile, WBC 18.8(21.7), hgb 7.8(8.1); drain fl cx- rare candida albicans; cont current tx, ? transfuse, monitor labs closely; plan f/u CT this week if OP cont to decrease   Electronically Signed: D. Rowe Robert, PA-C 07/07/2022, 9:42 AM   I spent a total of 15 Minutes at the the patient's bedside AND on the patient's hospital floor or unit, greater than 50% of which was counseling/coordinating care for left upper abdominal fluid collection drain    Patient ID: Gabriel Dalton, male   DOB: 09-11-1985, 37 y.o.   MRN: [id number]

## 2022-07-07 NOTE — Progress Notes (Signed)
Order received from J. Olena Heckle, NP.

## 2022-07-07 NOTE — Progress Notes (Addendum)
PHARMACY - TOTAL PARENTERAL NUTRITION CONSULT NOTE   Indication: malnutrition, bowel perforation, expected prolonged ileus  Patient Measurements: Height: '5\' 8"'$  (172.7 cm) Weight: 48 kg (105 lb 13.1 oz) IBW/kg (Calculated) : 68.4 TPN AdjBW (KG): 49.8 Body mass index is 16.09 kg/m.  Assessment: 7 yoM admitted with abdominal pain, weight loss.  Underwent laparoscopic cholecystectomy on 2/14 and was found with multiple bowel perforations and stool contamination of abdomen, converted to exploratory laparotomy, with small bowel resection and reanastomosis, and temporary abdominal closure.  TPN started on 06/25/22.    Glucose / Insulin: No noted hx DM. A1c 5.8. Goal BG <150.  Electrolytes:  Lytes remain wnl. Mg low at 1.6 this am, given 1g already. Na borderline low but stable. TPN with max Na concentration. Goal for ileus: Mag > 2, K > 4, Phos ~3 Renal: SCr < 1 - Difficult urethral catheterization performed by urology on 2/14 Hepatic: LFTs wnl; Alk Phos elevated at 133 - Albumin low - TG: trending down to wnl Intake / Output; MIVF: NS '@60'$  ml/hr  - I/O: +2441 mL - good UOP GI Imaging: - 2/20 Abd CT: Findings more suggestive of ileus in the setting of peritonitis. Loculated LEFT subdiaphragmatic fluid suspicious for abscess deforming the spleen. GI Surgeries / Procedures:  2/14 OR: small bowel resection and reanastomosis, open abdomen 2/16 OR: exp lap, washout and abdominal closure   Central access: Double lumen PICC placed 2/15 TPN start date: 2/15  Nutritional Goals: Goal TPN rate is 80 mL/hr to provide 96 g of protein and 2039 kcals per day.  RD Assessment: Estimated Needs Total Energy Estimated Needs: 1950-2100 kcals Total Protein Estimated Needs: 90-100 grams Total Fluid Estimated Needs: >/= 1.9L  Current Nutrition:  Clear liquid diet TPN   Plan:  Give another Mg 2g IV x 1 later today. (Received 1g this am)  At 1800:  Continue TPN at goal rate of 80 mL/hr Provides  96 g protein, 2039 kcal, 345 g dextrose   Electrolytes in TPN:  Na 150 mEq/L K 50 mEq/L Ca 21mq/L Mg 7 mEq/L Phos 15 mmol/L.  Cl:Ac: 1:1 Add standard MVI and trace elements to TPN mIVF: NS at 60 ml/hr per MD Monitor TPN labs on Mon/Thurs.  MD ordered phos and mag daily  NElenor Quinones PharmD, BCPS, BColoradoClinical Pharmacist 07/07/2022 8:24 AM

## 2022-07-07 NOTE — Progress Notes (Signed)
PROGRESS NOTE    Gabriel Dalton  [id number] DOB: 29-Jan-1986 DOA: 06/24/2022 PCP: Marrian Salvage, FNP   Brief Narrative: 37 yo BM PMHx chronic abd pain, wt loss, bronchitis, multiple gastric ulcers, tobacco abuse, gallstones (for which he saw [facility] outpt 2/8 and chole was offered but not yet scheduled) presented to [facility] ED 2/13 with CC abd pain. followed by gastroenterology labouer as outpatient and has had extensive workup in past 3 years for similar presentation-he has had extensive workup including multiple CT scans, MRI, ultrasound, NM gastric emptying study, most recent endoscopy evaluation in August 2023 revealing esophagitis with no bleeding, gastritis and biopsy with mild nonspecific reactive gastropathy, he has had positive occult blood 05/31/2022 which was attributed to hemorrhoids.  He endorses about 60 pound weight loss since the symptoms started.  He was recently seen by general surgery on 06/19/2022 to discuss cholecystectomy given that he has a gallstone. Patient was taken to the OR on 06/25/2022-had numerous small bowel perforations and evacuation of 1 L of bowel contents.  Assessment & Plan:   Principal Problem:   Cholelithiasis Active Problems:   Pain of upper abdomen   Severe protein-calorie malnutrition (HCC)   Small bowel perforation (HCC)   Endotracheal tube present   Gallstones   Peritonitis ([place])   Endotracheally intubated   #1 sepsis due to peritonitis from small bowel perforation-on Zosyn.  Still with significant leukocytosis though finally starting to trend down.  Patient had a bowel movement and flatus 2/22 after suppository.   2/25 no bm or flatus Starting him on clear liquids 2/23 by surgery.   CT abdomen 07/01/2022-suggestive of ileus in the setting of peritonitis.  Loculated left subdiaphragmatic fluid suspicious for abscess deforming the spleen.  Suspicious complicated postoperative fluid perihepatic.  Left greater than right pleural effusion  and body wall edema.  IR placed drain at the  LUQ 2/22 with cloudy drainage  growing rare candida albicans Surgery discussed with ID and started Diflucan.  #2 small bowel perforation status postresection and reanastomosis Followed by general surgery. Diet advanced to full liquids on 07/07/2022  #3 BPH difficult Foley placement placed by urology Foley was Dc'd 2/22 patient is able to urinate without any problem.  #4 multiple electrolyte abnormalities including hyponatremia, hypokalemia, hypomagnesemia and hypophosphatemia-on TPN Hypokalemia resolved. Sodium still mildly low at 132 Mag 2.2 Phosphorus 3.2  #5 thrombocytosis likely reactive on Lovenox Patient cannot tolerate aspirin due to stomach ulcers will not start aspirin Has iron deficiency (can't give iv iron with active infection)  Nutrition Problem: Severe Malnutrition Etiology: chronic illness   Signs/Symptoms: severe fat depletion, severe muscle depletion, percent weight loss (22% weight loss in 11 months) Percent weight loss: 22 % (in 11 months)    Interventions: Refer to RD note for recommendations, Boost Breeze, MVI  Estimated body mass index is 16.09 kg/m as calculated from the following:   Height as of this encounter: '5\' 8"'$  (1.727 m).   Weight as of this encounter: 48 kg.  DVT prophylaxis: Lovenox Code Status: Full code Family Communication: Wife at bedside  disposition Plan:  Status is: Inpatient Remains inpatient appropriate because: On TPN status post peritonitis   Consultants:  General surgery PCCM  Procedures: Ex lap 06/25/2022 Intubation Extubated 06/28/2022 06/27/2022 OR for washout and abdominal closure Left upper quadrant drain 07/03/2022  Antimicrobials: Zosyn  Subjective:  Ambulated in the hallway yesterday was able to have some flatus.  Had a small BM after suppository.  Concerned about so much of gas  pain with walking.  However he was motivated and I saw him walking 30 minutes after I saw  him.  Objective: Vitals:   07/06/22 1405 07/06/22 1425 07/06/22 2005 07/07/22 0502  BP: (!) 138/103 131/85 (!) 125/95 (!) 132/94  Pulse: (!) 106 (!) 106 (!) 102 (!) 103  Resp: '16 14 18 17  '$ Temp: 97.6 F (36.4 C)  98.6 F (37 C) 98.5 F (36.9 C)  TempSrc: Oral  Oral Oral  SpO2: 100% 100% 100% 98%  Weight:      Height:        Intake/Output Summary (Last 24 hours) at 07/07/2022 1216 Last data filed at 07/07/2022 1147 Gross per 24 hour  Intake 3964.98 ml  Output 1430 ml  Net 2534.98 ml    Filed Weights   06/25/22 2053 06/26/22 1045 07/03/22 1014  Weight: 49.8 kg 48.3 kg 48 kg    Examination: General exam: Appears in distress  respiratory system: Diminished at the bases  cardiovascular system: S1 & S2 heard, RRR. No JVD, murmurs, rubs, gallops or clicks. No pedal edema. Gastrointestinal system: Abdomen is distended, soft and tender. No organomegaly or masses felt. Normal bowel sounds heard.  Covered with dressing Left upper quadrant drain in place with cloudy looking drainage noted Central nervous system: Alert and oriented. No focal neurological deficits. Extremities: No edema  skin: No rashes, lesions or ulcers Psychiatry: Judgement and insight appear normal. Mood & affect appropriate.     Data Reviewed: I have personally reviewed following labs and imaging studies  CBC: Recent Labs  Lab 07/03/22 0334 07/04/22 0431 07/05/22 0335 07/06/22 0301 07/07/22 0305  WBC 26.4* 28.2* 25.4* 21.7* 18.8*  NEUTROABS 20.4* 22.7* 20.3* 17.3* 15.1*  HGB 8.9* 8.3* 8.3* 8.1* 7.8*  HCT 25.3* 24.1* 24.2* 23.4* 23.0*  MCV 82.1 83.4 82.9 83.3 84.9  PLT 547* 622* 826* 991* 1,070*    Basic Metabolic Panel: Recent Labs  Lab 07/02/22 0251 07/03/22 0334 07/04/22 0431 07/05/22 0335 07/06/22 0301 07/07/22 0305  NA 130* 132* 132* 131* 132*  --   K 3.9 4.0 3.9 4.2 4.1  --   CL 98 100 101 98 100  --   CO2 '23 22 23 24 24  '$ --   GLUCOSE 112* 112* 117* 105* 107*  --   BUN '11 12 12 12  12  '$ --   CREATININE 0.49* 0.43* 0.43* 0.51* 0.48*  --   CALCIUM 8.1* 8.3* 8.2* 8.5* 8.4*  --   MG 1.8 1.8 1.9 1.7 2.2 1.6*  PHOS 3.0 2.9 3.0 3.4 3.2 3.0    GFR: Estimated Creatinine Clearance: 87.5 mL/min (A) (by C-G formula based on SCr of 0.48 mg/dL (L)). Liver Function Tests: Recent Labs  Lab 07/02/22 0251 07/03/22 0334 07/04/22 0431 07/05/22 0335 07/06/22 0301  AST 15 13* 14* 16 15  ALT '16 15 15 18 17  '$ ALKPHOS 73 77 98 135* 133*  BILITOT 1.0 0.7 0.5 0.7 0.8  PROT 5.9* 5.6* 6.2* 6.3* 6.1*  ALBUMIN 2.1* 2.0* 2.1* 2.3* 2.2*    No results for input(s): "LIPASE", "AMYLASE" in the last 168 hours. No results for input(s): "AMMONIA" in the last 168 hours. Coagulation Profile: Recent Labs  Lab 07/03/22 0334  INR 1.1    Cardiac Enzymes: No results for input(s): "CKTOTAL", "CKMB", "CKMBINDEX", "TROPONINI" in the last 168 hours. BNP (last 3 results) No results for input(s): "PROBNP" in the last 8760 hours. HbA1C: No results for input(s): "HGBA1C" in the last 72 hours. CBG: Recent Labs  Lab 07/02/22 1122 07/02/22 1533 07/03/22 0001 07/03/22 0711 07/03/22 1608  GLUCAP 104* 117* 112* 114* 105*    Lipid Profile: Recent Labs    07/07/22 0305  TRIG 119    Thyroid Function Tests: No results for input(s): "TSH", "T4TOTAL", "FREET4", "T3FREE", "THYROIDAB" in the last 72 hours. Anemia Panel: No results for input(s): "VITAMINB12", "FOLATE", "FERRITIN", "TIBC", "IRON", "RETICCTPCT" in the last 72 hours.  Sepsis Labs: No results for input(s): "PROCALCITON", "LATICACIDVEN" in the last 168 hours.  Recent Results (from the past 240 hour(s))  Aerobic/Anaerobic Culture w Gram Stain (surgical/deep wound)     Status: None (Preliminary result)   Collection Time: 07/03/22  3:34 PM   Specimen: Abscess  Result Value Ref Range Status   Specimen Description   Final    ABSCESS Performed at [facility] [address], [place], [place] [address]    Special  Requests   Final    Normal Performed at [facility], [place] [address] [place], [place] [address]    Gram Stain   Final    RARE WBC PRESENT,BOTH PMN AND MONONUCLEAR RARE BUDDING YEAST SEEN Performed at [facility], [place] [address] [place], [place] [address]    Culture   Final    RARE CANDIDA ALBICANS NO ANAEROBES ISOLATED; CULTURE IN PROGRESS FOR 5 DAYS    Report Status PENDING  Incomplete         Radiology Studies: No results found.      Scheduled Meds:  acetaminophen  1,000 mg Oral Q6H   Chlorhexidine Gluconate Cloth  6 each Topical Q0600   enoxaparin (LOVENOX) injection  40 mg Subcutaneous Daily   fentaNYL  1 patch Transdermal Q72H   fluticasone  1 spray Each Nare Daily   pantoprazole (PROTONIX) IV  40 mg Intravenous Q24H   simethicone  80 mg Oral QID   sodium chloride flush  10-40 mL Intracatheter Q12H   sodium chloride flush  5 mL Intracatheter Q8H   Continuous Infusions:  sodium chloride Stopped (06/27/22 0832)   sodium chloride 60 mL/hr at 07/06/22 1918   fluconazole (DIFLUCAN) IV     magnesium sulfate bolus IVPB     methocarbamol (ROBAXIN) IV 1,000 mg (07/07/22 0803)   methocarbamol (ROBAXIN) IV 500 mg (07/02/22 2048)   piperacillin-tazobactam 3.375 g (07/07/22 0838)   TPN ADULT (ION) 80 mL/hr at 07/06/22 1713   TPN ADULT (ION)       LOS: 11 days    Time spent: 39 min  Georgette Shell, MD  07/07/2022, 12:16 PM

## 2022-07-07 NOTE — Progress Notes (Signed)
Patient ID: Gabriel Dalton, male   DOB: November 29, 1985, 37 y.o.   MRN: [id number] [facility] Surgery Progress Note  10 Days Post-Op  Subjective: CC-  Continues to have intermittent abdominal pain, attributes this to gas pains. States that he was able to pass some flatus yesterday on a walk and had a very small BM with suppository. Denies any n/v. Tolerating clear liquids but does feel full quickly. Feels very hungry. Took a long walk this morning. WBC down 18.8, afebrile. Drain with 35cc tan output  Objective: Vital signs in last 24 hours: Temp:  [97.6 F (36.4 C)-98.6 F (37 C)] 98.5 F (36.9 C) (02/26 0502) Pulse Rate:  [102-106] 103 (02/26 0502) Resp:  [14-18] 17 (02/26 0502) BP: (125-138)/(85-103) 132/94 (02/26 0502) SpO2:  [98 %-100 %] 98 % (02/26 0502) Last BM Date : 07/06/22  Intake/Output from previous day: 02/25 0701 - 02/26 0700 In: 3479 [P.O.:660; I.V.:2286.6; IV Piggyback:527.4] Out: 1335 [Urine:1300; Drains:35] Intake/Output this shift: Total I/O In: 1279.4 [I.V.:1164.4; Other:5; IV Piggyback:110] Out: 250 [Urine:250]  PE: Gen:  Alert, NAD, pleasant Card:  RRR Pulm:  rate and effort normal on room air Abd: Soft, mild distension, few bowel sounds heard, tender in the lower quadrants without rebound or guarding, open midline with fibrinous tissue and visible suture at the base/ otherwise healthy granulation tissue. Small port site incision in epigastric area open cdi  Lab Results:  Recent Labs    07/06/22 0301 07/07/22 0305  WBC 21.7* 18.8*  HGB 8.1* 7.8*  HCT 23.4* 23.0*  PLT 991* 1,070*   BMET Recent Labs    07/05/22 0335 07/06/22 0301  NA 131* 132*  K 4.2 4.1  CL 98 100  CO2 24 24  GLUCOSE 105* 107*  BUN 12 12  CREATININE 0.51* 0.48*  CALCIUM 8.5* 8.4*   PT/INR No results for input(s): "LABPROT", "INR" in the last 72 hours. CMP     Component Value Date/Time   NA 132 (L) 07/06/2022 0301   K 4.1 07/06/2022 0301   CL 100  07/06/2022 0301   CO2 24 07/06/2022 0301   GLUCOSE 107 (H) 07/06/2022 0301   BUN 12 07/06/2022 0301   CREATININE 0.48 (L) 07/06/2022 0301   CALCIUM 8.4 (L) 07/06/2022 0301   PROT 6.1 (L) 07/06/2022 0301   ALBUMIN 2.2 (L) 07/06/2022 0301   AST 15 07/06/2022 0301   ALT 17 07/06/2022 0301   ALKPHOS 133 (H) 07/06/2022 0301   BILITOT 0.8 07/06/2022 0301   GFRNONAA >60 07/06/2022 0301   GFRAA >60 04/28/2019 1313   Lipase     Component Value Date/Time   LIPASE 30 06/24/2022 1400       Studies/Results: No results found.  Anti-infectives: Anti-infectives (From admission, onward)    Start     Dose/Rate Route Frequency Ordered Stop   06/26/22 0000  piperacillin-tazobactam (ZOSYN) IVPB 3.375 g        3.375 g 12.5 mL/hr over 240 Minutes Intravenous Every 8 hours 06/25/22 1832     06/25/22 1815  piperacillin-tazobactam (ZOSYN) IVPB 3.375 g        3.375 g 100 mL/hr over 30 Minutes Intravenous  Once 06/25/22 1802 06/25/22 1830   06/25/22 1756  piperacillin-tazobactam (ZOSYN) 3.375 GM/50ML IVPB       Note to Pharmacy: Ward, Christa K: cabinet override      06/25/22 1756 06/27/22 0333   06/25/22 1630  ceFAZolin (ANCEF) IVPB 2g/100 mL premix        2 g 200  mL/hr over 30 Minutes Intravenous  Once 06/25/22 1534 06/25/22 1640        Assessment/Plan POD 12/10 s/p ex lap with SBR and washout for feculent peritonitis for SB perforation from SBO by Dr. Thermon Leyland  06/25/22; S/p ex lap abdominal closure 2/16 - Feculent peritonitis found in OR with 2 SB perforations noted from obstructive process.  Unclear etiology. Mother had something similar and wife states sister is having some GI issues, ? Need for further work up to rule out [employer] or other etiology - S/p IR perc drain 2/22. Cx's with Rare Candida Albicans and report pending - discussed with ID, will add IV fluconazole to his IV zosyn - WBC trending down 18.8, afebrile. Likely plan repeat CT later this week once drain output  down - Ok for FLD but patient knows to take this slowly. Continue TPN - Continue bid wtd dressing changes to midline abdominal wound - Pathology on small bowel benign with significant luminal narrowing at one point - Mobilize, pulm toilet   FEN - FLD. TNA. IVFs per primary/TPN VTE - SCDs, Lovenox ID - Zosyn 2/13 -->, fluconazole 2/26>> Foley - out 2/21, voiding well   SPCM - TNA Post op acute respiratory failure - resolved    LOS: 11 days    Wellington Hampshire, [facility] Surgery 07/07/2022, 11:03 AM Please see Amion for pager number during day hours 7:00am-4:30pm

## 2022-07-07 NOTE — Consult Note (Signed)
[place] for Infectious Disease  Total days of antibiotics 13        Reason for Consult:intra-abdominal abscess    Referring Physician: mathews  Principal Problem:   Cholelithiasis Active Problems:   Pain of upper abdomen   Severe protein-calorie malnutrition (HCC)   Small bowel perforation (HCC)   Endotracheal tube present   Gallstones   Peritonitis (HCC)   Endotracheally intubated    HPI: Gabriel Dalton is a 37 y.o. male history of gallstones and chronic abdominal pain with marked weight loss, hx of esophagitis and gastritis. He was admitted on 2/13 for worsening abdominal pain, especially post prandial. He was already seeing general surgery for elective cholecystectomy. On 2/14 he underwent laporascopic cholecystectomy but had to be converted to ex lap due to finding of fecal peritonitis due to multiple small bowel perforation s/p SB resection and reanastomosis and temp abdominal closure, then returned to OR on 2/16 for wash out and abdominal closure. He was placed on piptazo empirically. On 2/22 had imaging that showed perisplenic fluid collection where IR placed a drain- in setting of wbc of 28k. Still draing 61m per day. Cultures showing c.albicans.started on fluconazole today and wbc continues to trend downward  Past Medical History:  Diagnosis Date   Bronchitis    Multiple gastric ulcers     Allergies:  Allergies  Allergen Reactions   Aspirin Other (See Comments)    Cannot take due to stomach ulcers     MEDICATIONS:  acetaminophen  1,000 mg Oral Q6H   Chlorhexidine Gluconate Cloth  6 each Topical Q0600   enoxaparin (LOVENOX) injection  40 mg Subcutaneous Daily   fentaNYL  1 patch Transdermal Q72H   fluticasone  1 spray Each Nare Daily   pantoprazole (PROTONIX) IV  40 mg Intravenous Q24H   simethicone  80 mg Oral QID   sodium chloride flush  10-40 mL Intracatheter Q12H   sodium chloride flush  5 mL Intracatheter Q8H    Social History    Tobacco Use   Smoking status: Every Day    Packs/day: 1.00    Types: Cigarettes   Smokeless tobacco: Never  Vaping Use   Vaping Use: Former  Substance Use Topics   Alcohol use: Not Currently   Drug use: Not Currently    Frequency: 3.0 times per week    Family History  Problem Relation Age of Onset   Healthy Mother    Hypertension Father    Colon cancer Maternal Uncle    Rectal cancer Maternal Uncle    Brain cancer Maternal Uncle    Heart disease Maternal Aunt    Esophageal cancer Neg Hx    Stomach cancer Neg Hx     Review of Systems -  Review of Systems  Constitutional: Negative for fever, chills, diaphoresis, activity change, appetite change, fatigue and unexpected weight change.  HENT: Negative for congestion, sore throat, rhinorrhea, sneezing, trouble swallowing and sinus pressure.  Eyes: Negative for photophobia and visual disturbance.  Respiratory: Negative for cough, chest tightness, shortness of breath, wheezing and stridor.  Cardiovascular: Negative for chest pain, palpitations and leg swelling.  Gastrointestinal: + abdominal pain, but now negative for nausea, vomiting, abdominal pain, diarrhea, constipation, blood in stool, abdominal distention and anal bleeding.  Genitourinary: Negative for dysuria, hematuria, flank pain and difficulty urinating.  Musculoskeletal: Negative for myalgias, back pain, joint swelling, arthralgias and gait problem.  Skin: Negative for color change, pallor, rash and wound.  Neurological: Negative for dizziness, tremors,  weakness and light-headedness.  Hematological: Negative for adenopathy. Does not bruise/bleed easily.  Psychiatric/Behavioral: Negative for behavioral problems, confusion, sleep disturbance, dysphoric mood, decreased concentration and agitation.    OBJECTIVE: Temp:  [98.5 F (36.9 C)-98.6 F (37 C)] 98.5 F (36.9 C) (02/26 0502) Pulse Rate:  [102-105] 105 (02/26 1329) Resp:  [17-18] 18 (02/26 1329) BP:  (125-136)/(94-95) 136/95 (02/26 1329) SpO2:  [98 %-100 %] 100 % (02/26 1329) Physical Exam  Constitutional: He is oriented to person, place, and time. He appears well-developed and well-nourished. No distress.  HENT:  Mouth/Throat: Oropharynx is clear and moist. No oropharyngeal exudate.  Cardiovascular: Normal rate, regular rhythm and normal heart sounds. Exam reveals no gallop and no friction rub.  No murmur heard.  Pulmonary/Chest: Effort normal and breath sounds normal. No respiratory distress. He has no wheezes.  Abdominal: Soft. Bowel sounds are normal. He exhibits no distension. There is no tenderness. Midline incision covered. LUQ drain- purulent fluid Lymphadenopathy:  He has no cervical adenopathy.  Neurological: He is alert and oriented to person, place, and time.  Skin: Skin is warm and dry. No rash noted. No erythema.  Psychiatric: He has a normal mood and affect. His behavior is normal.    LABS: Results for orders placed or performed during the hospital encounter of 06/24/22 (from the past 48 hour(s))  Magnesium     Status: None   Collection Time: 07/06/22  3:01 AM  Result Value Ref Range   Magnesium 2.2 1.7 - 2.4 mg/dL    Comment: Performed at [facility], [place] [address], [place], [place] [address]  Phosphorus     Status: None   Collection Time: 07/06/22  3:01 AM  Result Value Ref Range   Phosphorus 3.2 2.5 - 4.6 mg/dL    Comment: Performed at [facility], [place] [address] [place], [place] [address]  CBC with Differential/Platelet     Status: Abnormal   Collection Time: 07/06/22  3:01 AM  Result Value Ref Range   WBC 21.7 (H) 4.0 - 10.5 K/uL   RBC 2.81 (L) 4.22 - 5.81 MIL/uL   Hemoglobin 8.1 (L) 13.0 - 17.0 g/dL   HCT 23.4 (L) 39.0 - 52.0 %   MCV 83.3 80.0 - 100.0 fL   MCH 28.8 26.0 - 34.0 pg   MCHC 34.6 30.0 - 36.0 g/dL   RDW 15.6 (H) 11.5 - 15.5 %   Platelets 991 (HH) 150 - 400 K/uL    Comment: REPEATED TO VERIFY THIS  CRITICAL RESULT HAS VERIFIED AND BEEN CALLED TO PAUDEL,B. BY SEEL,MOLLY ON 02 25 2024 AT 0420, AND HAS BEEN READ BACK.  THIS CRITICAL RESULT HAS VERIFIED AND BEEN CALLED TO PAUDEL,B. BY SEEL,MOLLY ON 02 25 2024 AT 0421, AND HAS BEEN READ BACK.     nRBC 0.0 0.0 - 0.2 %   Neutrophils Relative % 79 %   Neutro Abs 17.3 (H) 1.7 - 7.7 K/uL   Lymphocytes Relative 10 %   Lymphs Abs 2.1 0.7 - 4.0 K/uL   Monocytes Relative 8 %   Monocytes Absolute 1.7 (H) 0.1 - 1.0 K/uL   Eosinophils Relative 1 %   Eosinophils Absolute 0.2 0.0 - 0.5 K/uL   Basophils Relative 0 %   Basophils Absolute 0.1 0.0 - 0.1 K/uL   Immature Granulocytes 2 %   Abs Immature Granulocytes 0.41 (H) 0.00 - 0.07 K/uL   Target Cells PRESENT     Comment: Performed at [facility], [place]  [address] [place], [place] [address]  Comprehensive metabolic panel     Status: Abnormal   Collection Time: 07/06/22  3:01 AM  Result Value Ref Range   Sodium 132 (L) 135 - 145 mmol/L   Potassium 4.1 3.5 - 5.1 mmol/L   Chloride 100 98 - 111 mmol/L   CO2 24 22 - 32 mmol/L   Glucose, Bld 107 (H) 70 - 99 mg/dL    Comment: Glucose reference range applies only to samples taken after fasting for at least 8 hours.   BUN 12 6 - 20 mg/dL   Creatinine, Ser 0.48 (L) 0.61 - 1.24 mg/dL   Calcium 8.4 (L) 8.9 - 10.3 mg/dL   Total Protein 6.1 (L) 6.5 - 8.1 g/dL   Albumin 2.2 (L) 3.5 - 5.0 g/dL   AST 15 15 - 41 U/L   ALT 17 0 - 44 U/L   Alkaline Phosphatase 133 (H) 38 - 126 U/L   Total Bilirubin 0.8 0.3 - 1.2 mg/dL   GFR, Estimated >60 >60 mL/min    Comment: (NOTE) Calculated using the CKD-EPI Creatinine Equation (2021)    Anion gap 8 5 - 15    Comment: Performed at [facility], [place] [address] [place], [place] [address]  Triglycerides     Status: None   Collection Time: 07/07/22  3:05 AM  Result Value Ref Range   Triglycerides 119 <150 mg/dL    Comment: Performed at [facility], [place]  [address], [place], [place] [address]  Magnesium     Status: Abnormal   Collection Time: 07/07/22  3:05 AM  Result Value Ref Range   Magnesium 1.6 (L) 1.7 - 2.4 mg/dL    Comment: Performed at [facility], [place] [address], [place], [place] [address]  Phosphorus     Status: None   Collection Time: 07/07/22  3:05 AM  Result Value Ref Range   Phosphorus 3.0 2.5 - 4.6 mg/dL    Comment: Performed at [facility], [place] [address], [place], [place] [address]  CBC with Differential/Platelet     Status: Abnormal   Collection Time: 07/07/22  3:05 AM  Result Value Ref Range   WBC 18.8 (H) 4.0 - 10.5 K/uL   RBC 2.71 (L) 4.22 - 5.81 MIL/uL   Hemoglobin 7.8 (L) 13.0 - 17.0 g/dL   HCT 23.0 (L) 39.0 - 52.0 %   MCV 84.9 80.0 - 100.0 fL   MCH 28.8 26.0 - 34.0 pg   MCHC 33.9 30.0 - 36.0 g/dL   RDW 15.7 (H) 11.5 - 15.5 %   Platelets 1,070 (HH) 150 - 400 K/uL    Comment: REPEATED TO VERIFY THIS CRITICAL RESULT HAS VERIFIED AND BEEN CALLED TO PAUDEL,B BY PATRICIA LUZOLO ON 02 26 2024 AT 0406, AND HAS BEEN READ BACK. 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  Assessment/Plan: 37yo M with gallstone cholecystitis and small bowel perforation, feculant peritonitis s/p small bowel resection and intra-abdominal fluid collection noted on 2/22 s/p IR drain  - continue on piptazo and  fluconazole until repeat imaging at end of the week and then decide if can finish up on oral abtx if needed depending on size of residual fluid collection.  Severe protein calorie malnutrition = continues on TPN currently as he regains bowel function

## 2022-07-07 NOTE — Progress Notes (Signed)
Physical Therapy Treatment Patient Details Name: Gabriel Dalton MRN: [id number] DOB: 1985-07-28 Today's Date: 07/07/2022   History of Present Illness 37 year old admitted with abd pain, gallstones (for which he saw [facility] outpt 2/8 and chole was offered but not yet scheduled)  admitted with acute cholecystitis.   S/p ex-lap 2/14 and found with multiple bowel perforations and stool. Remained with open abdomen. PCCM consulted for vent management post-op. pt returned to OR for closure on 2/16. extubated 2/17    PT Comments    Discussed plan and progress with pt; pt is bothered by PT coming when he is in pain, he is concerned about his pain not  being managed and progression of his diet; pt reports amb with family and nursing staff, he needs no physical assist when amb with rollator, only assist for IV pole.  Encouraged pt to try to amb at least 3x/day(currently managing 2xonly d/t pain), discussed mobility with regard to healing.  Pt would likely benefit from HHPT or OPPT at d/c. Pt reports he has a home gym but will need guidance on home program for strengthening but not to impede healing process. Advised pt lifting will have to be cleared by surgeon.  Pt states his wife is concerned about mobility at home.  No further PT needs this setting, pt is in agreement so he may be in control of the timing of mobility/amb   Recommendations for follow up therapy are one component of a multi-disciplinary discharge planning process, led by the attending physician.  Recommendations may be updated based on patient status, additional functional criteria and insurance authorization.  Follow Up Recommendations  Other (comment) (HHPT vs OPPT)     Assistance Recommended at Discharge Intermittent Supervision/Assistance  Patient can return home with the following Assistance with cooking/housework;Help with stairs or ramp for entrance   Equipment Recommendations  Other (comment) (has rollator)     Recommendations for Other Services       Precautions / Restrictions Precautions Precaution Comments: TNA, multiple lines, abd incision Restrictions Weight Bearing Restrictions: No     Mobility  Bed Mobility Overal bed mobility: Needs Assistance Bed Mobility: Supine to Sit, Sit to Supine     Supine to sit: Supervision Sit to supine: Supervision   General bed mobility comments: pt able to perform without physical assist, painful transition; pt educated onlog roll but is resistant to this    Transfers   Equipment used: None Transfers: Sit to/from Stand Sit to Stand: Independent           General transfer comment: no physical assist, no device    Ambulation/Gait               General Gait Details: pt reporting to PT that he "just walked" with nursing staff.  took a few steps in room without device, turns R/L without assist, no LOB   Stairs             Wheelchair Mobility    Modified Rankin (Stroke Patients Only)       Balance     Sitting balance-Leahy Scale: Good     Standing balance support: No upper extremity supported, During functional activity Standing balance-Leahy Scale: Fair Standing balance comment: fair to good, NT to mod challenges                            Cognition Arousal/Alertness: Awake/alert Behavior During Therapy: WFL for tasks assessed/performed Overall Cognitive Status:  Within Functional Limits for tasks assessed                                 General Comments: easiliy annoyed by PT d/t elevated pain, stating he just got back to bed, can't get any rests, someone/nurses are always aggravating him        Exercises      General Comments General comments (skin integrity, edema, etc.): educated on STS for strengthening; encouraged pt to cotninue to amb with staff and/or family      Pertinent Vitals/Pain Pain Assessment Pain Assessment: Faces Faces Pain Scale: Hurts whole lot Pain  Location: abd Pain Descriptors / Indicators: Discomfort, Grimacing, Guarding Pain Intervention(s): Limited activity within patient's tolerance, Monitored during session, Repositioned    Home Living                          Prior Function            PT Goals (current goals can now be found in the care plan section) Acute Rehab PT Goals Patient Stated Goal: home and decrease pain PT Goal Formulation: With patient Time For Goal Achievement: 07/13/22 Potential to Achieve Goals: Good Progress towards PT goals: Progressing toward goals    Frequency           PT Plan Other (comment) (d/c PT)    Co-evaluation              AM-PAC PT "6 Clicks" Mobility   Outcome Measure  Help needed turning from your back to your side while in a flat bed without using bedrails?: None Help needed moving from lying on your back to sitting on the side of a flat bed without using bedrails?: None Help needed moving to and from a bed to a chair (including a wheelchair)?: None Help needed standing up from a chair using your arms (e.g., wheelchair or bedside chair)?: None Help needed to walk in hospital room?: None Help needed climbing 3-5 steps with a railing? : A Little 6 Click Score: 23    End of Session   Activity Tolerance: Patient limited by pain;Patient tolerated treatment well Patient left: in bed;with call bell/phone within reach   PT Visit Diagnosis: Muscle weakness (generalized) (M62.81);Pain Pain - part of body:  (abdomen)     Time: 0950-1020 PT Time Calculation (min) (ACUTE ONLY): 30 min  Charges:  $Gait Training: 8-22 mins                     Baxter Flattery, PT  Acute Rehab Dept [facility]) [phone]  [facility] Weekend Pager [facility] only)  [phone]  07/07/2022    [facility] 07/07/2022, 10:32 AM

## 2022-07-07 NOTE — Progress Notes (Signed)
Today's labs: Mg-1.6, H/H-7.8/23.0, Plt-1070.  Sent secure message to J. Olena Heckle, NP. He sent message back, stating that he will be looking at them.

## 2022-07-08 DIAGNOSIS — R1010 Upper abdominal pain, unspecified: Secondary | ICD-10-CM | POA: Diagnosis not present

## 2022-07-08 LAB — CBC WITH DIFFERENTIAL/PLATELET
Abs Immature Granulocytes: 0.21 10*3/uL — ABNORMAL HIGH (ref 0.00–0.07)
Basophils Absolute: 0.1 10*3/uL (ref 0.0–0.1)
Basophils Relative: 0 %
Eosinophils Absolute: 0.2 10*3/uL (ref 0.0–0.5)
Eosinophils Relative: 1 %
HCT: 21.9 % — ABNORMAL LOW (ref 39.0–52.0)
Hemoglobin: 7.4 g/dL — ABNORMAL LOW (ref 13.0–17.0)
Immature Granulocytes: 1 %
Lymphocytes Relative: 10 %
Lymphs Abs: 1.7 10*3/uL (ref 0.7–4.0)
MCH: 28.8 pg (ref 26.0–34.0)
MCHC: 33.8 g/dL (ref 30.0–36.0)
MCV: 85.2 fL (ref 80.0–100.0)
Monocytes Absolute: 1.7 10*3/uL — ABNORMAL HIGH (ref 0.1–1.0)
Monocytes Relative: 10 %
Neutro Abs: 13.6 10*3/uL — ABNORMAL HIGH (ref 1.7–7.7)
Neutrophils Relative %: 78 %
Platelets: 1048 10*3/uL (ref 150–400)
RBC: 2.57 MIL/uL — ABNORMAL LOW (ref 4.22–5.81)
RDW: 15.9 % — ABNORMAL HIGH (ref 11.5–15.5)
WBC: 17.4 10*3/uL — ABNORMAL HIGH (ref 4.0–10.5)
nRBC: 0 % (ref 0.0–0.2)

## 2022-07-08 LAB — COMPREHENSIVE METABOLIC PANEL WITH GFR
ALT: 19 U/L (ref 0–44)
AST: 15 U/L (ref 15–41)
Albumin: 2.4 g/dL — ABNORMAL LOW (ref 3.5–5.0)
Alkaline Phosphatase: 153 U/L — ABNORMAL HIGH (ref 38–126)
Anion gap: 6 (ref 5–15)
BUN: 13 mg/dL (ref 6–20)
CO2: 25 mmol/L (ref 22–32)
Calcium: 8.3 mg/dL — ABNORMAL LOW (ref 8.9–10.3)
Chloride: 96 mmol/L — ABNORMAL LOW (ref 98–111)
Creatinine, Ser: 0.51 mg/dL — ABNORMAL LOW (ref 0.61–1.24)
GFR, Estimated: >60 mL/min
Glucose, Bld: 106 mg/dL — ABNORMAL HIGH (ref 70–99)
Potassium: 3.9 mmol/L (ref 3.5–5.1)
Sodium: 127 mmol/L — ABNORMAL LOW (ref 135–145)
Total Bilirubin: 0.5 mg/dL (ref 0.3–1.2)
Total Protein: 7 g/dL (ref 6.5–8.1)

## 2022-07-08 LAB — AEROBIC/ANAEROBIC CULTURE W GRAM STAIN (SURGICAL/DEEP WOUND): Special Requests: NORMAL

## 2022-07-08 LAB — DIC (DISSEMINATED INTRAVASCULAR COAGULATION)PANEL
D-Dimer, Quant: 14.16 ug/mL-FEU — ABNORMAL HIGH (ref 0.00–0.50)
Fibrinogen: 720 mg/dL — ABNORMAL HIGH (ref 210–475)
INR: 1.2 (ref 0.8–1.2)
Platelets: 1037 10*3/uL (ref 150–400)
Prothrombin Time: 14.6 seconds (ref 11.4–15.2)
Smear Review: NONE SEEN
aPTT: 34 seconds (ref 24–36)

## 2022-07-08 LAB — MAGNESIUM: Magnesium: 1.9 mg/dL (ref 1.7–2.4)

## 2022-07-08 LAB — PHOSPHORUS: Phosphorus: 3.4 mg/dL (ref 2.5–4.6)

## 2022-07-08 MED ORDER — ENSURE ENLIVE PO LIQD
237.0000 mL | Freq: Two times a day (BID) | ORAL | Status: DC
  Administered 2022-07-09 – 2022-07-10 (×4): 237 mL via ORAL

## 2022-07-08 MED ORDER — MAGNESIUM SULFATE 2 GM/50ML IV SOLN: 2.0000 g | Freq: Once | INTRAVENOUS | Status: DC

## 2022-07-08 MED ORDER — TRAVASOL 10 % IV SOLN
INTRAVENOUS | Status: AC
  Filled 2022-07-08: qty 960

## 2022-07-08 MED ORDER — MAGNESIUM SULFATE IN D5W 1-5 GM/100ML-% IV SOLN
1.0000 g | Freq: Once | INTRAVENOUS | Status: AC
  Administered 2022-07-08: 1 g via INTRAVENOUS
  Filled 2022-07-08: qty 100

## 2022-07-08 NOTE — Progress Notes (Signed)
Patient ID: Gabriel Dalton, male   DOB: 1985-06-03, 37 y.o.   MRN: [id number] [facility] Surgery Progress Note  11 Days Post-Op  Subjective: CC-  Reports less cramping gas pain, mostly just incisional pain. States he feels much better overall. Tolerating liquids and reports increased stools last 24 hours, passing gas. Denies nausea. WBC down 17.4, afebrile. Drain with 60cc tan output  Objective: Vital signs in last 24 hours: Temp:  [97.8 F (36.6 C)-98.6 F (37 C)] 97.8 F (36.6 C) (02/27 1059) Pulse Rate:  [89-110] 96 (02/27 1059) Resp:  [18] 18 (02/27 0606) BP: (126-136)/(83-96) 126/83 (02/27 1059) SpO2:  [100 %] 100 % (02/27 1059) Last BM Date : 07/06/22  Intake/Output from previous day: 02/26 0701 - 02/27 0700 In: 4433.4 [P.O.:660; I.V.:3088.5; IV Piggyback:679.9] Out: 3440 [Urine:3350; Drains:90] Intake/Output this shift: No intake/output data recorded.  PE: Gen:  Alert, NAD, pleasant Card:  RRR Pulm:  rate and effort normal on room air Abd: Soft, mild distension, few bowel sounds heard, tender in the lower quadrants without rebound or guarding, open midline with fibrinous tissue and visible suture at the base/ otherwise healthy granulation tissue. Small port site incision in epigastric area open cdi. Drain purulent  Lab Results:  Recent Labs    07/07/22 0305 07/08/22 0447  WBC 18.8* 17.4*  HGB 7.8* 7.4*  HCT 23.0* 21.9*  PLT 1,070* 1,048*  1,037*   BMET Recent Labs    07/06/22 0301 07/08/22 0447  NA 132* 127*  K 4.1 3.9  CL 100 96*  CO2 24 25  GLUCOSE 107* 106*  BUN 12 13  CREATININE 0.48* 0.51*  CALCIUM 8.4* 8.3*   PT/INR Recent Labs    07/08/22 0447  LABPROT 14.6  INR 1.2   CMP     Component Value Date/Time   NA 127 (L) 07/08/2022 0447   K 3.9 07/08/2022 0447   CL 96 (L) 07/08/2022 0447   CO2 25 07/08/2022 0447   GLUCOSE 106 (H) 07/08/2022 0447   BUN 13 07/08/2022 0447   CREATININE 0.51 (L) 07/08/2022 0447   CALCIUM  8.3 (L) 07/08/2022 0447   PROT 7.0 07/08/2022 0447   ALBUMIN 2.4 (L) 07/08/2022 0447   AST 15 07/08/2022 0447   ALT 19 07/08/2022 0447   ALKPHOS 153 (H) 07/08/2022 0447   BILITOT 0.5 07/08/2022 0447   GFRNONAA >60 07/08/2022 0447   GFRAA >60 04/28/2019 1313   Lipase     Component Value Date/Time   LIPASE 30 06/24/2022 1400       Studies/Results: No results found.  Anti-infectives: Anti-infectives (From admission, onward)    Start     Dose/Rate Route Frequency Ordered Stop   07/07/22 1215  fluconazole (DIFLUCAN) IVPB 400 mg        400 mg 100 mL/hr over 120 Minutes Intravenous Every 24 hours 07/07/22 1118     06/26/22 0000  piperacillin-tazobactam (ZOSYN) IVPB 3.375 g        3.375 g 12.5 mL/hr over 240 Minutes Intravenous Every 8 hours 06/25/22 1832     06/25/22 1815  piperacillin-tazobactam (ZOSYN) IVPB 3.375 g        3.375 g 100 mL/hr over 30 Minutes Intravenous  Once 06/25/22 1802 06/25/22 1830   06/25/22 1756  piperacillin-tazobactam (ZOSYN) 3.375 GM/50ML IVPB       Note to Pharmacy: Ward, Christa K: cabinet override      06/25/22 1756 06/27/22 0333   06/25/22 1630  ceFAZolin (ANCEF) IVPB 2g/100 mL premix  2 g 200 mL/hr over 30 Minutes Intravenous  Once 06/25/22 1534 06/25/22 1640        Assessment/Plan POD 13/11 s/p ex lap with SBR and washout for feculent peritonitis for SB perforation from SBO by Dr. Thermon Leyland  06/25/22; S/p ex lap abdominal closure 2/16 - Feculent peritonitis found in OR with 2 SB perforations noted from obstructive process.  Unclear etiology. Mother had something similar and wife states sister is having some GI issues, ? Need for further work up to rule out [employer] or other etiology - S/p IR perc drain 2/22. Cx's with Rare Candida Albicans and report pending - discussed with ID, will add IV fluconazole to his IV zosyn - WBC trending down 18.8 > 17.4, afebrile. Likely plan repeat CT later this week once drain output down - Ok  for SOFT diet + ensure but patient knows to take this slowly. Continue TPN - Continue bid wtd dressing changes to midline abdominal wound - Pathology on small bowel benign with significant luminal narrowing at one point - Mobilize, pulm toilet   FEN - SOFT. TNA. IVFs per primary/TPN; will consider calorie count later this week VTE - SCDs, Lovenox ID - Zosyn 2/13 -->, fluconazole 2/26>> Foley - out 2/21, voiding well   SPCM - TNA Post op acute respiratory failure - resolved    LOS: 12 days    Jill Alexanders, [facility] Surgery 07/08/2022, 11:01 AM Please see Amion for pager number during day hours 7:00am-4:30pm

## 2022-07-08 NOTE — Progress Notes (Signed)
PHARMACY - TOTAL PARENTERAL NUTRITION CONSULT NOTE   Indication: malnutrition, bowel perforation, expected prolonged ileus  Patient Measurements: Height: '5\' 8"'$  (172.7 cm) Weight: 48 kg (105 lb 13.1 oz) IBW/kg (Calculated) : 68.4 TPN AdjBW (KG): 49.8 Body mass index is 16.09 kg/m.  Assessment: 29 yoM admitted with abdominal pain, weight loss.  Underwent laparoscopic cholecystectomy on 2/14 and was found with multiple bowel perforations and stool contamination of abdomen, converted to exploratory laparotomy, with small bowel resection and reanastomosis, and temporary abdominal closure.  TPN started on 06/25/22.    Glucose / Insulin: No noted hx DM. A1c 5.8. Goal BG <150, currently WNL.  Electrolytes: Cl low/decreased to 96. Na remains low and has decreased (TPN with ~ just slightly below max Na concentration). Mg 1.9 and K 3.9, both slightly below goal. Other lytes, including Corrected Calcium, WNL.  Goal for ileus: Mag > 2, K > 4, Phos ~3 Renal: SCr < 1 - Difficult urethral catheterization performed by urology on 2/14 Hepatic: AST/ALT WNL. Alk Phos elevated/increased to 153. Tbili WNL.  - Albumin low - TG: trended down to WNL (2/26) Intake / Output; MIVF: NS @ 60 mL/hr  - I/O: +993 mL - good UOP GI Imaging: - 2/20 Abd CT: Findings more suggestive of ileus in the setting of peritonitis. Loculated LEFT subdiaphragmatic fluid suspicious for abscess deforming the spleen. GI Surgeries / Procedures:  2/14 OR: small bowel resection and reanastomosis, open abdomen 2/16 OR: exp lap, washout and abdominal closure   Central access: Double lumen PICC placed 2/15 TPN start date: 2/15  Nutritional Goals: Goal TPN rate is 80 mL/hr to provide 96 g of protein and 2039 kcals per day.  RD Assessment: Estimated Needs Total Energy Estimated Needs: 1950-2100 kcals Total Protein Estimated Needs: 90-100 grams Total Fluid Estimated Needs: >/= 1.9L  Current Nutrition:  Full liquid diet TPN   Plan:   Magnesium Sulfate 1g IV x 1   At 1800:  Continue TPN at goal rate of 80 mL/hr Provides 96 g protein, 2039 kcal   Electrolytes in TPN:  Na 154 mEq/L (maxed) K 60 mEq/L (increased) Ca 17mq/L Mg 9 mEq/L (increased) Phos 15 mmol/L Cl:Ac: 2:1 Add standard MVI and trace elements to TPN mIVF: NS at 60 ml/hr per MD Monitor TPN labs on Mon/Thurs CMET in AM MD ordered Mg and Phos daily    JLindell Spar PharmD, BCPS Clinical Pharmacist 07/08/2022 7:23 AM

## 2022-07-08 NOTE — Progress Notes (Signed)
Nutrition Follow-up  DOCUMENTATION CODES:   Severe malnutrition in context of chronic illness  INTERVENTION:   -Ensure Plus High Protein po BID, each supplement provides 350 kcal and 20 grams of protein.   -Placed lunch order per patient request  -TPN management per Pharmacy  NUTRITION DIAGNOSIS:   Severe Malnutrition related to chronic illness as evidenced by severe fat depletion, severe muscle depletion, percent weight loss (22% weight loss in 11 months).  Ongoing.  GOAL:   Patient will meet greater than or equal to 90% of their needs  Meeting with TPN  MONITOR:   PO intake, Supplement acceptance, Diet advancement, Weight trends  ASSESSMENT:   37 yo male with of chronic abdomen pain and weight loss presented to the ED for further evaluation. He was being followed by gastroenterology lumbar as outpatient and has had extensive workup in past 3 years for similar presentation. Admitted after being found to have cholelithiasis.  2/13 Admit 2/14 ex-lap, found to have SB perf, s/p small bowel resection and reanastomosis, and temporary abdominal closure; remained intubated afterwards 2/16 washout and abdominal closure 2/17 Extubated 2/22 s/p drain  Patient in room, family at bedside. Pt states he is eager to eat but worried to see if he can tolerate solids. Encouraged pt to taking slow bites and to not push himself to eat. He is agreeable to Ensures, wants strawberry flavor -added to current order. Pt was deciding on lunch, asked RD to order for him.   Patient continues to receive TPN at goal of 80 ml/hr, providing 2039 kcals and 96g protein.  Admission weight: 108 lbs Last weight 2/22: 105 lbs  Medications: IV Mg sulfate  Labs reviewed: CBGs: 105 Low Na   Diet Order:   Diet Order             DIET SOFT Room service appropriate? Yes; Fluid consistency: Thin  Diet effective now                   EDUCATION NEEDS:   Education needs have been  addressed  Skin:  Skin Assessment: Skin Integrity Issues: Skin Integrity Issues:: Stage II Stage II: mid coccyx Incisions: Abdomen  Last BM:  2/26 -type 5&6  Height:   Ht Readings from Last 1 Encounters:  06/25/22 '5\' 8"'$  (1.727 m)    Weight:   Wt Readings from Last 1 Encounters:  07/03/22 48 kg   BMI:  Body mass index is 16.09 kg/m.  Estimated Nutritional Needs:   Kcal:  1950-2100 kcals  Protein:  90-100 grams  Fluid:  >/= 1.9L  Clayton Bibles, MS, RD, LDN Inpatient Clinical Dietitian Contact information available via Amion

## 2022-07-08 NOTE — TOC Progression Note (Addendum)
Transition of Care [facility]) - Progression Note   Patient Details  Name: Gabriel Dalton MRN: [id number] Date of Birth: 02/16/1986  [facility] CM/SW [place], LCSW Phone Number: 07/08/2022, 1:28 PM  Clinical Narrative: CSW made [place] referrals to the following agencies:  [facility]: declined as patient's copay will be 50% [facility]: declined [facility]: declined [facility]: declined [facility]: declined, patient's insurance does not have out of network benefits [facility]: declined [facility]: no response [facility]: under review  Addendum: Levada Dy with [facility] accepted the referral for HHPT. CSW updated patient.  Barriers to Discharge: Continued Medical Work up  Social Determinants of Health (SDOH) Interventions SDOH Screenings   Depression (PHQ2-9): High Risk (04/17/2022)  Tobacco Use: High Risk (07/03/2022)   Readmission Risk Interventions     No data to display

## 2022-07-08 NOTE — Progress Notes (Signed)
PROGRESS NOTE    Gabriel Dalton  [id number] DOB: 10/12/1985 DOA: 06/24/2022 PCP: Marrian Salvage, FNP   Brief Narrative: 37 yo BM PMHx chronic abd pain, wt loss, bronchitis, multiple gastric ulcers, tobacco abuse, gallstones (for which he saw [facility] outpt 2/8 and chole was offered but not yet scheduled) presented to [facility] ED 2/13 with CC abd pain. followed by gastroenterology labouer as outpatient and has had extensive workup in past 3 years for similar presentation-he has had extensive workup including multiple CT scans, MRI, ultrasound, NM gastric emptying study, most recent endoscopy evaluation in August 2023 revealing esophagitis with no bleeding, gastritis and biopsy with mild nonspecific reactive gastropathy, he has had positive occult blood 05/31/2022 which was attributed to hemorrhoids.  He endorses about 60 pound weight loss since the symptoms started.  He was recently seen by general surgery on 06/19/2022 to discuss cholecystectomy given that he has a gallstone. Patient was taken to the OR on 06/25/2022-had numerous small bowel perforations and evacuation of 1 L of bowel contents.  Assessment & Plan:   Principal Problem:   Cholelithiasis Active Problems:   Pain of upper abdomen   Severe protein-calorie malnutrition (HCC)   Small bowel perforation (HCC)   Endotracheal tube present   Gallstones   Peritonitis ([place])   Endotracheally intubated   #1 sepsis due to peritonitis from small bowel perforation-on Zosyn.  Still with significant leukocytosis though finally starting to trend down.  Patient had bowel movements overnight and passing gas.  General surgery has advanced his diet to soft diet.    CT abdomen 07/01/2022-suggestive of ileus in the setting of peritonitis.  Loculated left subdiaphragmatic fluid suspicious for abscess deforming the spleen.  Suspicious complicated postoperative fluid perihepatic.  Left greater than right pleural effusion and body wall edema.  IR  placed drain at the  LU[facility] 2/22 with cloudy drainage  growing rare candida albicans  ID started Diflucan.  #2 small bowel perforation status postresection and reanastomosis Followed by general surgery. Diet advanced to soft diet on 07/08/2022  #3 BPH difficult Foley placement placed by urology Foley was Dc'd 2/22 patient is able to urinate without any problem.  #4 multiple electrolyte abnormalities including hyponatremia, hypokalemia, hypomagnesemia and hypophosphatemia-on TPN Hypokalemia resolved. Sodium still mildly low at 132 Mag 2.2 Phosphorus 3.2  #5 thrombocytosis likely reactive dw heme Patient cannot tolerate aspirin due to stomach ulcers will not start aspirin Has iron deficiency (can't give iv iron with active infection)  Nutrition Problem: Severe Malnutrition Etiology: chronic illness   Signs/Symptoms: severe fat depletion, severe muscle depletion, percent weight loss (22% weight loss in 11 months) Percent weight loss: 22 % (in 11 months)    Interventions: Refer to RD note for recommendations, Boost Breeze, MVI  Estimated body mass index is 16.09 kg/m as calculated from the following:   Height as of this encounter: '5\' 8"'$  (1.727 m).   Weight as of this encounter: 48 kg.  DVT prophylaxis: Lovenox Code Status: Full code Family Communication: Wife at bedside  disposition Plan:  Status is: Inpatient Remains inpatient appropriate because: On TPN status post peritonitis   Consultants:  General surgery PCCM  Procedures: Ex lap 06/25/2022 Intubation Extubated 06/28/2022 06/27/2022 OR for washout and abdominal closure Left upper quadrant drain 07/03/2022  Antimicrobials: Zosyn  Subjective: Starting to move bowels  Objective: Vitals:   07/07/22 1329 07/07/22 2151 07/08/22 0606 07/08/22 1059  BP: (!) 136/95 133/87 (!) 130/96 126/83  Pulse: (!) 105 (!) 110 89 96  Resp:  $'18 18 18   'R$ Temp:  98.3 F (36.8 C) 98.6 F (37 C) 97.8 F (36.6 C)  TempSrc:  Oral Oral Oral   SpO2: 100% 100% 100% 100%  Weight:      Height:        Intake/Output Summary (Last 24 hours) at 07/08/2022 1312 Last data filed at 07/08/2022 1101 Gross per 24 hour  Intake 3153.98 ml  Output 3200 ml  Net -46.02 ml    Filed Weights   06/25/22 2053 06/26/22 1045 07/03/22 1014  Weight: 49.8 kg 48.3 kg 48 kg    Examination: General exam: Appears in distress  respiratory system: Diminished at the bases  cardiovascular system: S1 & S2 heard, RRR. No JVD, murmurs, rubs, gallops or clicks. No pedal edema. Gastrointestinal system: Abdomen is distended, soft and tender. No organomegaly or masses felt. Normal bowel sounds heard.  Covered with dressing Left upper quadrant drain in place with cloudy looking drainage noted Central nervous system: Alert and oriented. No focal neurological deficits. Extremities: No edema  skin: No rashes, lesions or ulcers Psychiatry: Judgement and insight appear normal. Mood & affect appropriate.     Data Reviewed: I have personally reviewed following labs and imaging studies  CBC: Recent Labs  Lab 07/04/22 0431 07/05/22 0335 07/06/22 0301 07/07/22 0305 07/08/22 0447  WBC 28.2* 25.4* 21.7* 18.8* 17.4*  NEUTROABS 22.7* 20.3* 17.3* 15.1* 13.6*  HGB 8.3* 8.3* 8.1* 7.8* 7.4*  HCT 24.1* 24.2* 23.4* 23.0* 21.9*  MCV 83.4 82.9 83.3 84.9 85.2  PLT 622* 826* 991* 1,070* 1,048*  1,037*    Basic Metabolic Panel: Recent Labs  Lab 07/03/22 0334 07/04/22 0431 07/05/22 0335 07/06/22 0301 07/07/22 0305 07/08/22 0447  NA 132* 132* 131* 132*  --  127*  K 4.0 3.9 4.2 4.1  --  3.9  CL 100 101 98 100  --  96*  CO2 '22 23 24 24  '$ --  25  GLUCOSE 112* 117* 105* 107*  --  106*  BUN '12 12 12 12  '$ --  13  CREATININE 0.43* 0.43* 0.51* 0.48*  --  0.51*  CALCIUM 8.3* 8.2* 8.5* 8.4*  --  8.3*  MG 1.8 1.9 1.7 2.2 1.6* 1.9  PHOS 2.9 3.0 3.4 3.2 3.0 3.4    GFR: Estimated Creatinine Clearance: 87.5 mL/min (A) (by C-G formula based on SCr of 0.51 mg/dL  (L)). Liver Function Tests: Recent Labs  Lab 07/03/22 0334 07/04/22 0431 07/05/22 0335 07/06/22 0301 07/08/22 0447  AST 13* 14* '16 15 15  '$ ALT '15 15 18 17 19  '$ ALKPHOS 77 98 135* 133* 153*  BILITOT 0.7 0.5 0.7 0.8 0.5  PROT 5.6* 6.2* 6.3* 6.1* 7.0  ALBUMIN 2.0* 2.1* 2.3* 2.2* 2.4*    No results for input(s): "LIPASE", "AMYLASE" in the last 168 hours. No results for input(s): "AMMONIA" in the last 168 hours. Coagulation Profile: Recent Labs  Lab 07/03/22 0334 07/08/22 0447  INR 1.1 1.2    Cardiac Enzymes: No results for input(s): "CKTOTAL", "CKMB", "CKMBINDEX", "TROPONINI" in the last 168 hours. BNP (last 3 results) No results for input(s): "PROBNP" in the last 8760 hours. HbA1C: No results for input(s): "HGBA1C" in the last 72 hours. CBG: Recent Labs  Lab 07/02/22 1122 07/02/22 1533 07/03/22 0001 07/03/22 0711 07/03/22 1608  GLUCAP 104* 117* 112* 114* 105*    Lipid Profile: Recent Labs    07/07/22 0305  TRIG 119    Thyroid Function Tests: No results for input(s): "TSH", "T4TOTAL", "FREET4", "T3FREE", "THYROIDAB"  in the last 72 hours. Anemia Panel: No results for input(s): "VITAMINB12", "FOLATE", "FERRITIN", "TIBC", "IRON", "RETICCTPCT" in the last 72 hours.  Sepsis Labs: No results for input(s): "PROCALCITON", "LATICACIDVEN" in the last 168 hours.  Recent Results (from the past 240 hour(s))  Aerobic/Anaerobic Culture w Gram Stain (surgical/deep wound)     Status: None (Preliminary result)   Collection Time: 07/03/22  3:34 PM   Specimen: Abscess  Result Value Ref Range Status   Specimen Description   Final    ABSCESS Performed at [facility] [address], [place], [place] [address]    Special Requests   Final    Normal Performed at [facility], [place] [address] [place], [place] [address]    Gram Stain   Final    RARE WBC PRESENT,BOTH PMN AND MONONUCLEAR RARE BUDDING YEAST SEEN Performed at [facility], [place] [address] [place], [place] [address]    Culture   Final    RARE CANDIDA ALBICANS NO ANAEROBES ISOLATED; CULTURE IN PROGRESS FOR 5 DAYS    Report Status PENDING  Incomplete         Radiology Studies: No results found.      Scheduled Meds:  acetaminophen  1,000 mg Oral Q6H   Chlorhexidine Gluconate Cloth  6 each Topical Q0600   enoxaparin (LOVENOX) injection  40 mg Subcutaneous Daily   feeding supplement  237 mL Oral BID BM   fentaNYL  1 patch Transdermal Q72H   fluticasone  1 spray Each Nare Daily   pantoprazole (PROTONIX) IV  40 mg Intravenous Q24H   simethicone  80 mg Oral QID   sodium chloride flush  10-40 mL Intracatheter Q12H   sodium chloride flush  5 mL Intracatheter Q8H   Continuous Infusions:  sodium chloride Stopped (06/27/22 0832)   sodium chloride 60 mL/hr at 07/06/22 1918   fluconazole (DIFLUCAN) IV 400 mg (07/07/22 1332)   methocarbamol (ROBAXIN) IV 1,000 mg (07/08/22 1015)   methocarbamol (ROBAXIN) IV 500 mg (07/02/22 2048)   piperacillin-tazobactam 3.375 g (07/08/22 0842)   TPN ADULT (ION) 80 mL/hr at 07/07/22 1810   TPN ADULT (ION)       LOS: 12 days    Time spent: 39 min  Georgette Shell, MD  07/08/2022, 1:12 PM

## 2022-07-09 DIAGNOSIS — E43 Unspecified severe protein-calorie malnutrition: Secondary | ICD-10-CM | POA: Diagnosis not present

## 2022-07-09 DIAGNOSIS — E878 Other disorders of electrolyte and fluid balance, not elsewhere classified: Secondary | ICD-10-CM

## 2022-07-09 DIAGNOSIS — K659 Peritonitis, unspecified: Secondary | ICD-10-CM | POA: Diagnosis not present

## 2022-07-09 DIAGNOSIS — K8020 Calculus of gallbladder without cholecystitis without obstruction: Secondary | ICD-10-CM | POA: Diagnosis not present

## 2022-07-09 DIAGNOSIS — K631 Perforation of intestine (nontraumatic): Secondary | ICD-10-CM | POA: Diagnosis not present

## 2022-07-09 DIAGNOSIS — R188 Other ascites: Secondary | ICD-10-CM | POA: Diagnosis not present

## 2022-07-09 DIAGNOSIS — N400 Enlarged prostate without lower urinary tract symptoms: Secondary | ICD-10-CM

## 2022-07-09 LAB — CBC WITH DIFFERENTIAL/PLATELET
Abs Immature Granulocytes: 0.1 10*3/uL — ABNORMAL HIGH (ref 0.00–0.07)
Basophils Absolute: 0 10*3/uL (ref 0.0–0.1)
Basophils Relative: 0 %
Eosinophils Absolute: 0.2 10*3/uL (ref 0.0–0.5)
Eosinophils Relative: 2 %
HCT: 22.6 % — ABNORMAL LOW (ref 39.0–52.0)
Hemoglobin: 7.6 g/dL — ABNORMAL LOW (ref 13.0–17.0)
Immature Granulocytes: 1 %
Lymphocytes Relative: 15 %
Lymphs Abs: 1.9 10*3/uL (ref 0.7–4.0)
MCH: 28.7 pg (ref 26.0–34.0)
MCHC: 33.6 g/dL (ref 30.0–36.0)
MCV: 85.3 fL (ref 80.0–100.0)
Monocytes Absolute: 1.5 10*3/uL — ABNORMAL HIGH (ref 0.1–1.0)
Monocytes Relative: 12 %
Neutro Abs: 8.7 10*3/uL — ABNORMAL HIGH (ref 1.7–7.7)
Neutrophils Relative %: 70 %
Platelets: 1146 10*3/uL (ref 150–400)
RBC: 2.65 MIL/uL — ABNORMAL LOW (ref 4.22–5.81)
RDW: 16.1 % — ABNORMAL HIGH (ref 11.5–15.5)
WBC: 12.5 10*3/uL — ABNORMAL HIGH (ref 4.0–10.5)
nRBC: 0 % (ref 0.0–0.2)

## 2022-07-09 LAB — COMPREHENSIVE METABOLIC PANEL
ALT: 20 U/L (ref 0–44)
AST: 17 U/L (ref 15–41)
Albumin: 2.7 g/dL — ABNORMAL LOW (ref 3.5–5.0)
Alkaline Phosphatase: 147 U/L — ABNORMAL HIGH (ref 38–126)
Anion gap: 9 (ref 5–15)
BUN: 11 mg/dL (ref 6–20)
CO2: 27 mmol/L (ref 22–32)
Calcium: 8.7 mg/dL — ABNORMAL LOW (ref 8.9–10.3)
Chloride: 98 mmol/L (ref 98–111)
Creatinine, Ser: 0.53 mg/dL — ABNORMAL LOW (ref 0.61–1.24)
GFR, Estimated: >60 mL/min (ref >60)
Glucose, Bld: 100 mg/dL — ABNORMAL HIGH (ref 70–99)
Potassium: 4 mmol/L (ref 3.5–5.1)
Sodium: 134 mmol/L — ABNORMAL LOW (ref 135–145)
Total Bilirubin: 0.6 mg/dL (ref 0.3–1.2)
Total Protein: 7.3 g/dL (ref 6.5–8.1)

## 2022-07-09 LAB — MAGNESIUM: Magnesium: 2.3 mg/dL (ref 1.7–2.4)

## 2022-07-09 LAB — PATHOLOGIST SMEAR REVIEW

## 2022-07-09 LAB — PHOSPHORUS: Phosphorus: 3.5 mg/dL (ref 2.5–4.6)

## 2022-07-09 MED ORDER — TRAVASOL 10 % IV SOLN
INTRAVENOUS | Status: DC
  Filled 2022-07-09: qty 960

## 2022-07-09 MED ORDER — FOLIC ACID 1 MG PO TABS
1.0000 mg | ORAL_TABLET | Freq: Every day | ORAL | Status: DC
  Administered 2022-07-09 – 2022-07-12 (×4): 1 mg via ORAL
  Filled 2022-07-09 (×4): qty 1

## 2022-07-09 MED ORDER — TRAVASOL 10 % IV SOLN
INTRAVENOUS | Status: AC
  Filled 2022-07-09: qty 480

## 2022-07-09 MED ORDER — TRAVASOL 10 % IV SOLN: INTRAVENOUS | Status: DC

## 2022-07-09 NOTE — Progress Notes (Signed)
PROGRESS NOTE    Gabriel Dalton  [id number] DOB: 1986/04/07 DOA: 06/24/2022 PCP: Marrian Salvage, FNP    Chief Complaint  Patient presents with   Abdominal Pain    Brief Narrative:  37 yo BM PMHx chronic abd pain, wt loss, bronchitis, multiple gastric ulcers, tobacco abuse, gallstones (for which he saw [facility] outpt 2/8 and chole was offered but not yet scheduled) presented to [facility] ED 2/13 with CC abd pain. followed by gastroenterology labouer as outpatient and has had extensive workup in past 3 years for similar presentation-he has had extensive workup including multiple CT scans, MRI, ultrasound, NM gastric emptying study, most recent endoscopy evaluation in August 2023 revealing esophagitis with no bleeding, gastritis and biopsy with mild nonspecific reactive gastropathy, he has had positive occult blood 05/31/2022 which was attributed to hemorrhoids.  He endorses about 60 pound weight loss since the symptoms started.  He was recently seen by general surgery on 06/19/2022 to discuss cholecystectomy given that he has a gallstone. Patient was taken to the OR on 06/25/2022-had numerous small bowel perforations and evacuation of 1 L of bowel contents.   Assessment & Plan:   Principal Problem:   Cholelithiasis Active Problems:   Pain of upper abdomen   Severe protein-calorie malnutrition (HCC)   Small bowel perforation (HCC)   Endotracheal tube present   Gallstones   Peritonitis (HCC)   Endotracheally intubated   Electrolyte abnormality   Benign prostatic hyperplasia  #1 sepsis secondary to peritonitis from small bowel perforation/intra-abdominal abscesses -Patient is status post explopratory laparotomy with small bowel resection and washout for feculent peritonitis for small bowel perforation from small bowel obstruction per Dr.Stechschulte 06/25/2022: Status post exploratory laparotomy abdominal closure 216/2024 -Patient noted with a significant leukocytosis which is  starting to trend down. -CT abdomen and pelvis 07/01/2022 suggestive of ileus in the setting of peritonitis. -Patient status post IR percutaneous drain placement 07/03/2022 with cultures growing rare Candida albicans. -Diet being advanced per general surgery to soft diet and protein shakes. -Currently on TPN which has been weaned down per general surgery. -Continue twice daily WTD dressing changes to midline abdominal wound as recommended by general surgery. -Patient likely needs repeat imaging later on this week for follow-up on abscesses however defer to general surgery. -Continue IV Zosyn, IV fluconazole as recommended per ID. -General surgery and ID following and appreciate input and recommendations.  2.  BPH -Foley can be difficult to place, urology consulted Foley catheter placed per urology. -Foley catheter discontinued and patient noted to have good urinary output with a urine output 2.7 L over the past 24 hours.  3.  Electrolyte abnormalities including hyponatremia/hypokalemia/hypomagnesemia/hypophosphatemia -Currently on TPN. -Electrolyte replacement per pharmacy.  4.  Thrombocytosis -Likely secondary to acute infection. -Dr. Zigmund , discussed with hematology and felt it was likely reactive. -Patient with prior history of gastric ulcers and as such unable to tolerate aspirin at this time.  5.  Severe protein calorie malnutrition -Diet being advanced to soft diet and patient started on protein shakes per general surgery. -Currently on TPN.  6.  Normocytic anemia -Patient with no overt bleeding. -Hemoglobin currently at 7.6. -Anemia panel with iron level of 17, TIBC 157, ferritin of 602, folate of 5.3. -Unable to receive IV iron due to acute infection. -Placed on folate 1 mg daily.  7. Pressure injury Pressure Injury 07/06/22 Coccyx Mid Stage 2 -  Partial thickness loss of dermis presenting as a shallow open injury with a red, pink wound bed without slough. (  Active)   07/06/22 1307  Location: Coccyx  Location Orientation: Mid  Staging: Stage 2 -  Partial thickness loss of dermis presenting as a shallow open injury with a red, pink wound bed without slough.  Wound Description (Comments):   Present on Admission: No       DVT prophylaxis: Lovenox Code Status: Full Family Communication: Updated patient.  No family at bedside. Disposition: TBD  Status is: Inpatient Remains inpatient appropriate because: Severity of illness   Consultants:  PCCM: Dr. Loanne Drilling 06/25/2022 General surgery: Dr. Donne Hazel 06/24/2022 Urology: Dr. Alinda Money 06/25/2022 Interventional radiology: Dr. Anselm Pancoast 07/03/2022 Infectious disease: Dr. Baxter Flattery 07/07/2022  Procedures:  CT head 06/24/2022 CT abdomen and pelvis 07/01/2022 Chest x-ray 06/25/2022 Abdominal films 06/28/2022 MRI L-spine 06/24/2022 Right upper quadrant ultrasound 06/24/2022 CT drain placement left upper quadrant abscess per IR, Dr. Annamaria Boots 07/03/2022 Diagnostic laparoscopy, converted to expiratory laparotomy with small bowel resection and reanastomosis and temporary abdominal closure.  Dr.Stechschulte General surgery 06/25/2022. Expiratory laparotomy, washout, abdominal closure per general surgery: Dr.Stechschulte 06/27/2022 16 French coud catheter placement per urology: Dr. Alinda Money 06/25/2022  Significant Hospital Events: Including procedures, antibiotic start and stop dates in addition to other pertinent events   2/13 admit. [facility] consult for chole eval given known hx gallstones  2/14 intended lap chole --> ex lap with identification of small bowel perfs req resection + feculent peritonitis. To ICU post op intubated. Added zosyn  2/15 remains intubated sedated w open abd  2/16 OR for washout and abdominal closure, adding D5 with hypoglycemia on low rate TPN  2/17 extubated   Antimicrobials:  Anti-infectives (From admission, onward)    Start     Dose/Rate Route Frequency Ordered Stop   07/07/22 1215  fluconazole  (DIFLUCAN) IVPB 400 mg        400 mg 100 mL/hr over 120 Minutes Intravenous Every 24 hours 07/07/22 1118     06/26/22 0000  piperacillin-tazobactam (ZOSYN) IVPB 3.375 g        3.375 g 12.5 mL/hr over 240 Minutes Intravenous Every 8 hours 06/25/22 1832     06/25/22 1815  piperacillin-tazobactam (ZOSYN) IVPB 3.375 g        3.375 g 100 mL/hr over 30 Minutes Intravenous  Once 06/25/22 1802 06/25/22 1830   06/25/22 1756  piperacillin-tazobactam (ZOSYN) 3.375 GM/50ML IVPB       Note to Pharmacy: Ward, Christa K: cabinet override      06/25/22 1756 06/27/22 0333   06/25/22 1630  ceFAZolin (ANCEF) IVPB 2g/100 mL premix        2 g 200 mL/hr over 30 Minutes Intravenous  Once 06/25/22 1534 06/25/22 1640         Subjective: Sitting up near the window.  Denies any chest pain.  No shortness of breath.  Still with complaints of abdominal pain.  Able to tolerate liquids.  Stated had some bowel movement.  Objective: Vitals:   07/08/22 2232 07/09/22 0656 07/09/22 1319 07/09/22 1500  BP: 133/75 115/72 (!) 117/90   Pulse: (!) 106 97 (!) 107   Resp: '18 18 14   '$ Temp: 98.5 F (36.9 C) 98.7 F (37.1 C) 98.6 F (37 C)   TempSrc: Oral Oral Oral   SpO2: 100% 100% 100%   Weight:    51.4 kg  Height:        Intake/Output Summary (Last 24 hours) at 07/09/2022 1808 Last data filed at 07/09/2022 1721 Gross per 24 hour  Intake 3431.43 ml  Output 2960 ml  Net 471.43 ml  Filed Weights   06/26/22 1045 07/03/22 1014 07/09/22 1500  Weight: 48.3 kg 48 kg 51.4 kg    Examination:  General exam: Appears calm and comfortable  Respiratory system: Clear to auscultation. Respiratory effort normal. Cardiovascular system: S1 & S2 heard, RRR. No JVD, murmurs, rubs, gallops or clicks. No pedal edema. Gastrointestinal system: Abdomen is nondistended, soft and some diffuse tenderness to palpation.  Postop bandage noted on anterior abdomen.  JP drain left upper quadrant with whitish purulent drainage noted.   Positive bowel sounds.  No rebound.  No guarding. Central nervous system: Alert and oriented. No focal neurological deficits. Extremities: Symmetric 5 x 5 power. Skin: No rashes, lesions or ulcers Psychiatry: Judgement and insight appear normal. Mood & affect appropriate.     Data Reviewed: I have personally reviewed following labs and imaging studies  CBC: Recent Labs  Lab 07/05/22 0335 07/06/22 0301 07/07/22 0305 07/08/22 0447 07/09/22 0113  WBC 25.4* 21.7* 18.8* 17.4* 12.5*  NEUTROABS 20.3* 17.3* 15.1* 13.6* 8.7*  HGB 8.3* 8.1* 7.8* 7.4* 7.6*  HCT 24.2* 23.4* 23.0* 21.9* 22.6*  MCV 82.9 83.3 84.9 85.2 85.3  PLT 826* 991* 1,070* 1,048*  1,037* 1,146*    Basic Metabolic Panel: Recent Labs  Lab 07/04/22 0431 07/05/22 0335 07/06/22 0301 07/07/22 0305 07/08/22 0447 07/09/22 0113  NA 132* 131* 132*  --  127* 134*  K 3.9 4.2 4.1  --  3.9 4.0  CL 101 98 100  --  96* 98  CO2 '23 24 24  '$ --  25 27  GLUCOSE 117* 105* 107*  --  106* 100*  BUN '12 12 12  '$ --  13 11  CREATININE 0.43* 0.51* 0.48*  --  0.51* 0.53*  CALCIUM 8.2* 8.5* 8.4*  --  8.3* 8.7*  MG 1.9 1.7 2.2 1.6* 1.9 2.3  PHOS 3.0 3.4 3.2 3.0 3.4 3.5    GFR: Estimated Creatinine Clearance: 93.7 mL/min (A) (by C-G formula based on SCr of 0.53 mg/dL (L)).  Liver Function Tests: Recent Labs  Lab 07/04/22 0431 07/05/22 0335 07/06/22 0301 07/08/22 0447 07/09/22 0113  AST 14* '16 15 15 17  '$ ALT '15 18 17 19 20  '$ ALKPHOS 98 135* 133* 153* 147*  BILITOT 0.5 0.7 0.8 0.5 0.6  PROT 6.2* 6.3* 6.1* 7.0 7.3  ALBUMIN 2.1* 2.3* 2.2* 2.4* 2.7*    CBG: Recent Labs  Lab 07/03/22 0001 07/03/22 0711 07/03/22 1608  GLUCAP 112* 114* 105*     Recent Results (from the past 240 hour(s))  Aerobic/Anaerobic Culture w Gram Stain (surgical/deep wound)     Status: None   Collection Time: 07/03/22  3:34 PM   Specimen: Abscess  Result Value Ref Range Status   Specimen Description   Final    ABSCESS Performed at [facility], [place] [address], [place], [place] [address]    Special Requests   Final    Normal Performed at [facility], [place] [address], [place], [place] [address]    Gram Stain   Final    RARE WBC PRESENT,BOTH PMN AND MONONUCLEAR RARE BUDDING YEAST SEEN    Culture   Final    RARE CANDIDA ALBICANS NO ANAEROBES ISOLATED Performed at [facility], [address], [place], [place] [address]    Report Status 07/08/2022 FINAL  Final         Radiology Studies: No results found.      Scheduled Meds:  acetaminophen  1,000 mg Oral Q6H  Chlorhexidine Gluconate Cloth  6 each Topical Q0600   enoxaparin (LOVENOX) injection  40 mg Subcutaneous Daily   feeding supplement  237 mL Oral BID BM   fentaNYL  1 patch Transdermal Q72H   fluticasone  1 spray Each Nare Daily   folic acid  1 mg Oral Daily   pantoprazole (PROTONIX) IV  40 mg Intravenous Q24H   simethicone  80 mg Oral QID   sodium chloride flush  10-40 mL Intracatheter Q12H   sodium chloride flush  5 mL Intracatheter Q8H   Continuous Infusions:  sodium chloride Stopped (06/27/22 0832)   sodium chloride 60 mL/hr at 07/08/22 2250   fluconazole (DIFLUCAN) IV 400 mg (07/09/22 1131)   methocarbamol (ROBAXIN) IV 1,000 mg (07/09/22 1720)   methocarbamol (ROBAXIN) IV 500 mg (07/02/22 2048)   piperacillin-tazobactam 3.375 g (07/09/22 1537)   TPN ADULT (ION)       LOS: 13 days    Time spent: 45 minutes    Irine Seal, MD [facility]   To contact the attending provider between 7A-7P or the covering provider during after hours 7P-7A, please log into the web site [email or web address] and access using universal [place] password for that web site. If you do not have the password, please call the hospital operator.  07/09/2022, 6:08 PM

## 2022-07-09 NOTE — Progress Notes (Signed)
Referring Physician(s): Byerly,F  Supervising Physician: Aletta Edouard  Patient Status:  [facility] - In-pt  Chief Complaint: abdominal/back pain, left upper abdominal fluid collection     Subjective: Pt appears to be doing a little better today; has some mid abd/LUQ soreness but not as intense as before; no N/V; passing gas, having BM's   Allergies: Aspirin  Medications: Prior to Admission medications   Medication Sig Start Date End Date Taking? Authorizing Provider  acetaminophen (TYLENOL) 500 MG tablet Take 1,000 mg by mouth as needed for moderate pain.   Yes [provider]  ciprofloxacin (CIPRO) 500 MG tablet Take 500 mg by mouth 2 (two) times daily. 06/20/22  Yes [provider]  dicyclomine (BENTYL) 20 MG tablet Take 1 tablet (20 mg total) by mouth 2 (two) times daily. Patient taking differently: Take 20 mg by mouth daily as needed for spasms. 05/28/22  Yes Zehr, Laban Emperor, PA-C  linaclotide (LINZESS) 145 MCG CAPS capsule Take 1 capsule (145 mcg total) by mouth daily before breakfast. Patient taking differently: Take 145 mcg by mouth as needed (constipation). 04/23/22  Yes Zehr, Laban Emperor, PA-C  pantoprazole (PROTONIX) 40 MG tablet Take 1 tablet (40 mg total) by mouth 2 (two) times daily. 04/23/22  Yes Zehr, Laban Emperor, PA-C  sucralfate (CARAFATE) 1 g tablet Take 1 tablet (1 g total) by mouth 4 (four) times daily -  with meals and at bedtime. Patient taking differently: Take 1 g by mouth as needed (heartburn). 05/28/22  Yes Zehr, Laban Emperor, PA-C  tamsulosin (FLOMAX) 0.4 MG CAPS capsule Take 0.4 mg by mouth daily. 06/20/22  Yes [provider]  ketorolac (TORADOL) 10 MG tablet Take 10 mg by mouth every 6 (six) hours as needed. Patient not taking: Reported on 06/25/2022 06/20/22   [provider]  oxyCODONE-acetaminophen (PERCOCET/ROXICET) 5-325 MG tablet Take 1 tablet by mouth every 6 (six) hours as needed for severe pain. Patient not taking:  Reported on 06/25/2022 05/18/22   Rex Kras, PA  promethazine (PHENERGAN) 25 MG tablet Take 1 tablet (25 mg total) by mouth every 6 (six) hours as needed for nausea or vomiting. Patient not taking: Reported on 06/25/2022 05/18/22   Rex Kras, PA     Vital Signs: BP 115/72 (BP Location: Left Arm)   Pulse 97   Temp 98.7 F (37.1 C) (Oral)   Resp 18   Ht '5\' 8"'$  (1.727 m)   Wt 105 lb 13.1 oz (48 kg)   SpO2 100%   BMI 16.09 kg/m   Physical Exam awake, alert.  Left upper abdominal drain intact, insertion site mildly tender, output 55 cc turbid beige colored fluid; drain flushed without difficulty earlier today   Imaging: No results found.  Labs:  CBC: Recent Labs    07/06/22 0301 07/07/22 0305 07/08/22 0447 07/09/22 0113  WBC 21.7* 18.8* 17.4* 12.5*  HGB 8.1* 7.8* 7.4* 7.6*  HCT 23.4* 23.0* 21.9* 22.6*  PLT 991* 1,070* 1,048*  1,037* 1,146*    COAGS: Recent Labs    07/03/22 0334 07/08/22 0447  INR 1.1 1.2  APTT  --  34    BMP: Recent Labs    07/05/22 0335 07/06/22 0301 07/08/22 0447 07/09/22 0113  NA 131* 132* 127* 134*  K 4.2 4.1 3.9 4.0  CL 98 100 96* 98  CO2 '24 24 25 27  '$ GLUCOSE 105* 107* 106* 100*  BUN '12 12 13 11  '$ CALCIUM 8.5* 8.4* 8.3* 8.7*  CREATININE 0.51* 0.48* 0.51* 0.53*  GFRNONAA >60 >60 >60 >60    LIVER FUNCTION TESTS: Recent Labs    07/05/22 0335 07/06/22 0301 07/08/22 0447 07/09/22 0113  BILITOT 0.7 0.8 0.5 0.6  AST '16 15 15 17  '$ ALT '18 17 19 20  '$ ALKPHOS 135* 133* 153* 147*  PROT 6.3* 6.1* 7.0 7.3  ALBUMIN 2.3* 2.2* 2.4* 2.7*    Assessment and Plan: Pt  s/p ex lap with SBR and washout for feculent peritonitis for SB perforation from SBO by Dr. Thermon Leyland  06/25/22 ; s/p LUQ perisplenic fluid collection drain 07/03/22; afebrile, WBC  12.5(17.4), hgb 7.6(7.4), creat 0.93; monitor labs closely; plan f/u CT this week , likely Friday (3/1) if OP cont to decrease   Electronically Signed: D. Rowe Robert, PA-C 07/09/2022, 10:53 AM   I  spent a total of 15 Minutes at the the patient's bedside AND on the patient's hospital floor or unit, greater than 50% of which was counseling/coordinating care for  left upper abdominal fluid collection drain    Patient ID: Villa Herb, male   DOB: 1985/08/18, 37 y.o.   MRN: [id number]

## 2022-07-09 NOTE — Progress Notes (Signed)
Patient ID: Gabriel Dalton, male   DOB: Sep 12, 1985, 37 y.o.   MRN: [id number] [facility] Surgery Progress Note  12 Days Post-Op  Subjective: Feeling better today. Appetite slowly improving. Drinking Ensure this morning. Having regular bowel movements. WBC downtrending.  Objective: Vital signs in last 24 hours: Temp:  [97.8 F (36.6 C)-98.7 F (37.1 C)] 98.7 F (37.1 C) (02/28 0656) Pulse Rate:  [96-106] 97 (02/28 0656) Resp:  [18] 18 (02/28 0656) BP: (115-133)/(72-83) 115/72 (02/28 0656) SpO2:  [100 %] 100 % (02/28 0656) Last BM Date : 07/09/22  Intake/Output from previous day: 02/27 0701 - 02/28 0700 In: 2423.4 [P.O.:480; I.V.:1463.2; IV Piggyback:480.2] Out: [id number] [Urine:2700; Drains:55] Intake/Output this shift: Total I/O In: 768.1 [P.O.:240; I.V.:490; Other:5; IV Piggyback:33.1] Out: 210 [Urine:200; Drains:10]  PE: Gen:  Alert, NAD, pleasant Pulm:  rate and effort normal on room air Abd: Soft, nondistended, clean dressings in place over midline wound, drain purulent.  Lab Results:  Recent Labs    07/08/22 0447 07/09/22 0113  WBC 17.4* 12.5*  HGB 7.4* 7.6*  HCT 21.9* 22.6*  PLT 1,048*  1,037* 1,146*   BMET Recent Labs    07/08/22 0447 07/09/22 0113  NA 127* 134*  K 3.9 4.0  CL 96* 98  CO2 25 27  GLUCOSE 106* 100*  BUN 13 11  CREATININE 0.51* 0.53*  CALCIUM 8.3* 8.7*   PT/INR Recent Labs    07/08/22 0447  LABPROT 14.6  INR 1.2   CMP     Component Value Date/Time   NA 134 (L) 07/09/2022 0113   K 4.0 07/09/2022 0113   CL 98 07/09/2022 0113   CO2 27 07/09/2022 0113   GLUCOSE 100 (H) 07/09/2022 0113   BUN 11 07/09/2022 0113   CREATININE 0.53 (L) 07/09/2022 0113   CALCIUM 8.7 (L) 07/09/2022 0113   PROT 7.3 07/09/2022 0113   ALBUMIN 2.7 (L) 07/09/2022 0113   AST 17 07/09/2022 0113   ALT 20 07/09/2022 0113   ALKPHOS 147 (H) 07/09/2022 0113   BILITOT 0.6 07/09/2022 0113   GFRNONAA >60 07/09/2022 0113   GFRAA >60 04/28/2019  1313   Lipase     Component Value Date/Time   LIPASE 30 06/24/2022 1400       Studies/Results: No results found.  Anti-infectives: Anti-infectives (From admission, onward)    Start     Dose/Rate Route Frequency Ordered Stop   07/07/22 1215  fluconazole (DIFLUCAN) IVPB 400 mg        400 mg 100 mL/hr over 120 Minutes Intravenous Every 24 hours 07/07/22 1118     06/26/22 0000  piperacillin-tazobactam (ZOSYN) IVPB 3.375 g        3.375 g 12.5 mL/hr over 240 Minutes Intravenous Every 8 hours 06/25/22 1832     06/25/22 1815  piperacillin-tazobactam (ZOSYN) IVPB 3.375 g        3.375 g 100 mL/hr over 30 Minutes Intravenous  Once 06/25/22 1802 06/25/22 1830   06/25/22 1756  piperacillin-tazobactam (ZOSYN) 3.375 GM/50ML IVPB       Note to Pharmacy: Ward, Christa K: cabinet override      06/25/22 1756 06/27/22 0333   06/25/22 1630  ceFAZolin (ANCEF) IVPB 2g/100 mL premix        2 g 200 mL/hr over 30 Minutes Intravenous  Once 06/25/22 1534 06/25/22 1640        Assessment/Plan POD 14/12 s/p ex lap with SBR and washout for feculent peritonitis for SB perforation from SBO by Dr. Thermon Leyland  06/25/22;  S/p ex lap abdominal closure 2/16 - Feculent peritonitis found in OR with 2 SB perforations noted from obstructive process.  Unclear etiology. Mother had something similar and wife states sister is having some GI issues, ? Need for further work up to rule out [employer] or other etiology - S/p IR perc drain 2/22. Cx's with Rare Candida Albicans and report pending - discussed with ID, will add IV fluconazole to his IV zosyn. WBC continues to downtrend. - Continue soft diet and protein shakes. Wean TPN to 1/2 rate tonight. - Continue bid wtd dressing changes to midline abdominal wound - Pathology on small bowel benign with significant luminal narrowing at one point - Mobilize, pulm toilet   FEN - Soft diet, TPN to 1/2 rate VTE - SCDs, Lovenox ID - Zosyn 2/13 -->, fluconazole  2/26>> Foley - out 2/21, voiding well   Post op acute respiratory failure - resolved    LOS: 13 days    Dwan Bolt, MD [facility] Surgery 07/09/2022, 10:56 AM Please see Amion for pager number during day hours 7:00am-4:30pm

## 2022-07-09 NOTE — Progress Notes (Signed)
Nutrition Follow-up  DOCUMENTATION CODES:   Severe malnutrition in context of chronic illness  INTERVENTION:   -Needs updated weight  -Placed early dinner order per patient request  -Continue Ensure Plus High Protein po BID, each supplement provides 350 kcal and 20 grams of protein.   -TPN management per Pharmacy -recommend continue until patient can consume >50% of meals  NUTRITION DIAGNOSIS:   Severe Malnutrition related to chronic illness as evidenced by severe fat depletion, severe muscle depletion, percent weight loss (22% weight loss in 11 months).  Ongoing.  GOAL:   Patient will meet greater than or equal to 90% of their needs  Meeting with TPN  MONITOR:   PO intake, Supplement acceptance, Diet advancement, Weight trends  ASSESSMENT:   37 yo male with of chronic abdomen pain and weight loss presented to the ED for further evaluation. He was being followed by gastroenterology lumbar as outpatient and has had extensive workup in past 3 years for similar presentation. Admitted after being found to have cholelithiasis.  2/13 Admit 2/14 ex-lap, found to have SB perf, s/p small bowel resection and reanastomosis, and temporary abdominal closure; remained intubated afterwards 2/16 washout and abdominal closure 2/17 Extubated 2/22 s/p drain  Patient in room, no family present at bedside. Pt reports feeling some stomach pain today. This began after drinking 1/2 Ensure this morning. He has not eaten anything else today.  Pt was willing to eat a late lunch/early dinner of salmon and mashed potatoes. RD placed order for patient.  Offered to switch Ensure to another supplement but pt willing to keep trying Ensure at this time.   TPN continues at 80 ml/hr, providing 2039 kcals and 96g protein.  Admission weight: 108 lbs Last weight 2/22: 105 lbs Needs updated weight for admission. Placed order.  Medications reviewed.  Labs reviewed: Low Na  Diet Order:   Diet Order              DIET SOFT Room service appropriate? Yes; Fluid consistency: Thin  Diet effective now                   EDUCATION NEEDS:   Education needs have been addressed  Skin:  Skin Assessment: Skin Integrity Issues: Skin Integrity Issues:: Stage II Stage II: mid coccyx Incisions: Abdomen  Last BM:  2/28  Height:   Ht Readings from Last 1 Encounters:  06/25/22 '5\' 8"'$  (1.727 m)    Weight:   Wt Readings from Last 1 Encounters:  07/03/22 48 kg    BMI:  Body mass index is 16.09 kg/m.  Estimated Nutritional Needs:   Kcal:  1950-2100 kcals  Protein:  90-100 grams  Fluid:  >/= 1.9L  Clayton Bibles, MS, RD, LDN Inpatient Clinical Dietitian Contact information available via Amion

## 2022-07-09 NOTE — Progress Notes (Addendum)
PHARMACY - TOTAL PARENTERAL NUTRITION CONSULT NOTE   Indication: malnutrition, bowel perforation, expected prolonged ileus  Patient Measurements: Height: '5\' 8"'$  (172.7 cm) Weight: 48 kg (105 lb 13.1 oz) IBW/kg (Calculated) : 68.4 TPN AdjBW (KG): 49.8 Body mass index is 16.09 kg/m.  Assessment: 57 yoM admitted with abdominal pain, weight loss.  Underwent laparoscopic cholecystectomy on 2/14 and was found with multiple bowel perforations and stool contamination of abdomen, converted to exploratory laparotomy, with small bowel resection and reanastomosis, and temporary abdominal closure.  TPN started on 06/25/22.    Glucose / Insulin: No noted hx DM. A1c 5.8. Goal BG <150, currently WNL.  Electrolytes: Na remains low but improved (TPN with max Na concentration). Other lytes, including Corrected Calcium, WNL.  Goal for ileus: Mag > 2, K > 4 Renal: SCr < 1 - Difficult urethral catheterization performed by urology on 2/14 Hepatic: AST/ALT WNL. Alk Phos elevated, but trending down. Tbili WNL.  - Albumin low - TG: trended down to WNL (2/26) Intake / Output; MIVF: NS @ 60 mL/hr  - I/O: -523 mL - good UOP GI Imaging: - 2/20 Abd CT: Findings more suggestive of ileus in the setting of peritonitis. Loculated LEFT subdiaphragmatic fluid suspicious for abscess deforming the spleen. GI Surgeries / Procedures:  2/14 OR: small bowel resection and reanastomosis, open abdomen 2/16 OR: exp lap, washout and abdominal closure   Central access: Double lumen PICC placed 2/15 TPN start date: 2/15  Nutritional Goals: Goal TPN rate is 80 mL/hr to provide 96 g of protein and 2039 kcals per day.  RD Assessment: Estimated Needs Total Energy Estimated Needs: 1950-2100 kcals Total Protein Estimated Needs: 90-100 grams Total Fluid Estimated Needs: >/= 1.9L  Current Nutrition:  Soft diet TPN  Ensure Plus BID ordered 2/27 by dietician (patient refused on 2/27)  Plan:  At 1800:  Continue TPN at goal rate  of 80 mL/hr Provides 96 g protein, 2039 kcal   Electrolytes in TPN:  Na 154 mEq/L (max concentration) K 60 mEq/L Ca 80mq/L Mg 8 mEq/L Phos 15 mmol/L Cl:Ac: 2:1 Add standard MVI and trace elements to TPN mIVF: NS at 60 ml/hr per MD Monitor TPN labs on Mon/Thurs MD ordered Mg and Phos daily  Follow up patient's ability to tolerate diet and wean TPN as appropriate    JLindell Spar PharmD, BCPS Clinical Pharmacist 07/09/2022 6:29 AM   Addendum: Received consult from BMargie Billet PA-C to wean TPN to 1/2 rate. Clarified with provider to decrease rate now and to continue at this 1/2 rate with new bag this evening.  Will decrease TPN rate to 470mhr (discussed with RN). Continue same formulation as noted above.   JiLindell SparPharmD, BCPS Clinical Pharmacist  07/09/2022 11:08 AM

## 2022-07-09 NOTE — Progress Notes (Signed)
    [facility] for Infectious Disease    Date of Admission:  06/24/2022      ID: Gabriel Dalton is a 37 y.o. male with  intra-abdominal abscess Principal Problem:   Cholelithiasis Active Problems:   Pain of upper abdomen   Severe protein-calorie malnutrition (HCC)   Small bowel perforation (HCC)   Endotracheal tube present   Gallstones   Peritonitis ([place])   Endotracheally intubated    Subjective: Ambulating some yesterday, feeling abit better but slowly taking in more orals. Drinking mostly ensure.   Medications:   acetaminophen  1,000 mg Oral Q6H   Chlorhexidine Gluconate Cloth  6 each Topical Q0600   enoxaparin (LOVENOX) injection  40 mg Subcutaneous Daily   feeding supplement  237 mL Oral BID BM   fentaNYL  1 patch Transdermal Q72H   fluticasone  1 spray Each Nare Daily   pantoprazole (PROTONIX) IV  40 mg Intravenous Q24H   simethicone  80 mg Oral QID   sodium chloride flush  10-40 mL Intracatheter Q12H   sodium chloride flush  5 mL Intracatheter Q8H    Objective: Vital signs in last 24 hours: Temp:  [98.5 F (36.9 C)-98.7 F (37.1 C)] 98.6 F (37 C) (02/28 1319) Pulse Rate:  [97-107] 107 (02/28 1319) Resp:  [14-18] 14 (02/28 1319) BP: (115-133)/(72-90) 117/90 (02/28 1319) SpO2:  [100 %] 100 % (02/28 1319) Weight:  [51.4 kg] 51.4 kg (02/28 1500)  Physical Exam  Constitutional: He is oriented to person, place, and time. He appears well-developed and well-nourished. No distress.  HENT:  Mouth/Throat: Oropharynx is clear and moist. No oropharyngeal exudate.  Cardiovascular: Normal rate, regular rhythm and normal heart sounds. Exam reveals no gallop and no friction rub.  No murmur heard.  Pulmonary/Chest: Effort normal and breath sounds normal. No respiratory distress. He has no wheezes.  Abdominal: Soft. Bowel sounds are normal. He exhibits no distension. Drain- milky fluid. Dressing in place.  Lymphadenopathy:  He has no cervical adenopathy.   Neurological: He is alert and oriented to person, place, and time.  Skin: Skin is warm and dry. No rash noted. No erythema.  Psychiatric: He has a normal mood and affect. His behavior is normal.    Lab Results Recent Labs    07/08/22 0447 07/09/22 0113  WBC 17.4* 12.5*  HGB 7.4* 7.6*  HCT 21.9* 22.6*  NA 127* 134*  K 3.9 4.0  CL 96* 98  CO2 25 27  BUN 13 11  CREATININE 0.51* 0.53*   Liver Panel Recent Labs    07/08/22 0447 07/09/22 0113  PROT 7.0 7.3  ALBUMIN 2.4* 2.7*  AST 15 17  ALT 19 20  ALKPHOS 153* 147*  BILITOT 0.5 0.6    Microbiology: reviewed Studies/Results: No results found.   Assessment/Plan: Intra-abdominal abscess= continue with piptazo and fluconazole.   Thrombocytosis = continues to trend up in the last week ,likely acute phase reactant  possibly to infection  Will likely need repeat abdominal imaging over next few days to help identify size of remaining fluid collection and help with end date of abtx  [facility] Pager: [phone]  07/09/2022, 4:44 PM

## 2022-07-10 ENCOUNTER — Inpatient Hospital Stay (HOSPITAL_COMMUNITY): Payer: 59

## 2022-07-10 DIAGNOSIS — K8020 Calculus of gallbladder without cholecystitis without obstruction: Secondary | ICD-10-CM | POA: Diagnosis not present

## 2022-07-10 DIAGNOSIS — K631 Perforation of intestine (nontraumatic): Secondary | ICD-10-CM | POA: Diagnosis not present

## 2022-07-10 DIAGNOSIS — K659 Peritonitis, unspecified: Secondary | ICD-10-CM | POA: Diagnosis not present

## 2022-07-10 DIAGNOSIS — E43 Unspecified severe protein-calorie malnutrition: Secondary | ICD-10-CM | POA: Diagnosis not present

## 2022-07-10 LAB — COMPREHENSIVE METABOLIC PANEL WITH GFR
ALT: 17 U/L (ref 0–44)
AST: 15 U/L (ref 15–41)
Albumin: 2.4 g/dL — ABNORMAL LOW (ref 3.5–5.0)
Alkaline Phosphatase: 110 U/L (ref 38–126)
Anion gap: 7 (ref 5–15)
BUN: 9 mg/dL (ref 6–20)
CO2: 25 mmol/L (ref 22–32)
Calcium: 8.4 mg/dL — ABNORMAL LOW (ref 8.9–10.3)
Chloride: 97 mmol/L — ABNORMAL LOW (ref 98–111)
Creatinine, Ser: 0.6 mg/dL — ABNORMAL LOW (ref 0.61–1.24)
GFR, Estimated: >60 mL/min
Glucose, Bld: 96 mg/dL (ref 70–99)
Potassium: 4 mmol/L (ref 3.5–5.1)
Sodium: 129 mmol/L — ABNORMAL LOW (ref 135–145)
Total Bilirubin: 0.4 mg/dL (ref 0.3–1.2)
Total Protein: 6.6 g/dL (ref 6.5–8.1)

## 2022-07-10 LAB — CBC WITH DIFFERENTIAL/PLATELET
Abs Immature Granulocytes: 0.1 10*3/uL — ABNORMAL HIGH (ref 0.00–0.07)
Basophils Absolute: 0 10*3/uL (ref 0.0–0.1)
Basophils Relative: 0 %
Eosinophils Absolute: 0.2 10*3/uL (ref 0.0–0.5)
Eosinophils Relative: 2 %
HCT: 21.7 % — ABNORMAL LOW (ref 39.0–52.0)
Hemoglobin: 7.3 g/dL — ABNORMAL LOW (ref 13.0–17.0)
Immature Granulocytes: 1 %
Lymphocytes Relative: 20 %
Lymphs Abs: 2 10*3/uL (ref 0.7–4.0)
MCH: 28.7 pg (ref 26.0–34.0)
MCHC: 33.6 g/dL (ref 30.0–36.0)
MCV: 85.4 fL (ref 80.0–100.0)
Monocytes Absolute: 1.3 10*3/uL — ABNORMAL HIGH (ref 0.1–1.0)
Monocytes Relative: 13 %
Neutro Abs: 6.7 10*3/uL (ref 1.7–7.7)
Neutrophils Relative %: 64 %
Platelets: 1025 10*3/uL (ref 150–400)
RBC: 2.54 MIL/uL — ABNORMAL LOW (ref 4.22–5.81)
RDW: 16.6 % — ABNORMAL HIGH (ref 11.5–15.5)
WBC: 10.3 10*3/uL (ref 4.0–10.5)
nRBC: 0 % (ref 0.0–0.2)

## 2022-07-10 LAB — TRIGLYCERIDES: Triglycerides: 111 mg/dL (ref <150)

## 2022-07-10 LAB — PHOSPHORUS: Phosphorus: 4 mg/dL (ref 2.5–4.6)

## 2022-07-10 LAB — MAGNESIUM: Magnesium: 1.9 mg/dL (ref 1.7–2.4)

## 2022-07-10 MED ORDER — METHOCARBAMOL 500 MG PO TABS
1000.0000 mg | ORAL_TABLET | Freq: Four times a day (QID) | ORAL | Status: DC
  Administered 2022-07-10 – 2022-07-12 (×8): 1000 mg via ORAL
  Filled 2022-07-10 (×8): qty 2

## 2022-07-10 MED ORDER — MAGNESIUM SULFATE 2 GM/50ML IV SOLN
2.0000 g | Freq: Once | INTRAVENOUS | Status: AC
  Administered 2022-07-10: 2 g via INTRAVENOUS
  Filled 2022-07-10: qty 50

## 2022-07-10 MED ORDER — OXYCODONE HCL 5 MG PO TABS
10.0000 mg | ORAL_TABLET | ORAL | Status: DC | PRN
  Administered 2022-07-10 – 2022-07-11 (×4): 15 mg via ORAL
  Administered 2022-07-11 – 2022-07-12 (×4): 10 mg via ORAL
  Filled 2022-07-10: qty 2
  Filled 2022-07-10 (×5): qty 3
  Filled 2022-07-10 (×2): qty 2

## 2022-07-10 MED ORDER — ENSURE ENLIVE PO LIQD
237.0000 mL | Freq: Three times a day (TID) | ORAL | Status: DC
  Administered 2022-07-10 – 2022-07-12 (×5): 237 mL via ORAL

## 2022-07-10 MED ORDER — TRAVASOL 10 % IV SOLN
INTRAVENOUS | Status: AC
  Filled 2022-07-10: qty 480

## 2022-07-10 MED ORDER — IOHEXOL 300 MG/ML  SOLN
100.0000 mL | Freq: Once | INTRAMUSCULAR | Status: AC | PRN
  Administered 2022-07-10: 100 mL via INTRAVENOUS

## 2022-07-10 MED ORDER — SODIUM CHLORIDE (PF) 0.9 % IJ SOLN
INTRAMUSCULAR | Status: AC
  Filled 2022-07-10: qty 50

## 2022-07-10 NOTE — Progress Notes (Signed)
PHARMACY - TOTAL PARENTERAL NUTRITION CONSULT NOTE   Indication: malnutrition, bowel perforation, expected prolonged ileus  Patient Measurements: Height: '5\' 8"'$  (172.7 cm) Weight: 51.4 kg (113 lb 4.8 oz) IBW/kg (Calculated) : 68.4 TPN AdjBW (KG): 49.8 Body mass index is 17.23 kg/m.  Assessment: 61 yoM admitted with abdominal pain, weight loss. Underwent laparoscopic cholecystectomy on 2/14 and was found with multiple bowel perforations and stool contamination of abdomen, converted to exploratory laparotomy, with small bowel resection and reanastomosis, and temporary abdominal closure. TPN started on 06/25/22.    Glucose / Insulin: No noted hx DM. A1c 5.8. Goal BG <150, currently WNL.  Electrolytes: Na, Cl remain low but stable (TPN with max Na). Other lytes, including Corrected Calcium, WNL (Mg borderline low for ileus) Renal: SCr, BUN, bicarb stable WNL; robust UOP without diuretics Hepatic: LFTs/Tbili WNL; albumin remains low/stable; TG stable WNL  I/O: eating 20-50% of meals, having BMs (LBM 2/28) - Scant drain OP (<100 ml/day)  - MIVF: NS @ 60 mL/hr  GI Imaging: - 2/20 Abd CT: suggestive of ileus in the setting of peritonitis; suspicious for perisplenic abscess GI Surgeries / Procedures:  2/14 OR: small bowel resection and reanastomosis, open abdomen 2/16 OR: exp lap, washout and abdominal closure  2/22 IR drain placed in perisplenic abscess  Central access: Double lumen PICC placed 2/15 TPN start date: 2/15  Nutritional Goals: Goal TPN rate is 80 mL/hr to provide 96 g of protein and 2039 kcals per day. Goal for ileus: Mag >/= 2, K >/= 4  RD Assessment: Estimated Needs Total Energy Estimated Needs: 1950-2100 kcals Total Protein Estimated Needs: 90-100 grams Total Fluid Estimated Needs: >/= 1.9L  Current Nutrition:  Soft diet TPN  Ensure Plus BID ordered 2/27 by dietician (patient refused on 2/27)  Plan:  Mg 2g IV x 1  At 1800:  Continue TPN 40 mL/hr per  Surgery Provides 48 g protein, 1020 kcal   Electrolytes in TPN:  Na 154 mEq/L (max concentration) K 60 mEq/L Ca 2mq/L Mg 8 mEq/L Phos 15 mmol/L Cl:Ac: max Cl Add standard MVI and trace elements to TPN mIVF: cont NS at 60 ml/hr per MD Monitor TPN labs on Mon/Thurs MD ordered Mg and Phos daily Plan to recheck Na on 3/2 Follow up patient's ability to tolerate diet and wean TPN as appropriate    DReuel Boom PharmD, BCPS [phone]/29/2024, 10:28 AM

## 2022-07-10 NOTE — Progress Notes (Addendum)
PROGRESS NOTE    Gabriel Dalton  [id number] DOB: [birth year] DOA: 06/24/2022 PCP: Marrian Salvage, FNP    Chief Complaint  Patient presents with   Abdominal Pain    Brief Narrative:  37 yo BM PMHx chronic abd pain, wt loss, bronchitis, multiple gastric ulcers, tobacco abuse, gallstones (for which he saw [facility] outpt 2/8 and chole was offered but not yet scheduled) presented to [facility] ED 2/13 with CC abd pain. followed by gastroenterology labouer as outpatient and has had extensive workup in past 3 years for similar presentation-he has had extensive workup including multiple CT scans, MRI, ultrasound, NM gastric emptying study, most recent endoscopy evaluation in August 2023 revealing esophagitis with no bleeding, gastritis and biopsy with mild nonspecific reactive gastropathy, he has had positive occult blood 05/31/2022 which was attributed to hemorrhoids.  He endorses about 60 pound weight loss since the symptoms started.  He was recently seen by general surgery on 06/19/2022 to discuss cholecystectomy given that he has a gallstone. Patient was taken to the OR on 06/25/2022-had numerous small bowel perforations and evacuation of 1 L of bowel contents.   Assessment & Plan:   Principal Problem:   Cholelithiasis Active Problems:   Pain of upper abdomen   Severe protein-calorie malnutrition (HCC)   Small bowel perforation (HCC)   Endotracheal tube present   Gallstones   Peritonitis (HCC)   Endotracheally intubated   Electrolyte abnormality   Benign prostatic hyperplasia  #1 sepsis secondary to peritonitis from small bowel perforation/intra-abdominal abscesses -Patient is status post explopratory laparotomy with small bowel resection and washout for feculent peritonitis for small bowel perforation from small bowel obstruction per Dr.Stechschulte 06/25/2022: Status post exploratory laparotomy abdominal closure 216/2024 -Patient noted with a significant leukocytosis which is  starting to trend down. -CT abdomen and pelvis 07/01/2022 suggestive of ileus in the setting of peritonitis. -Patient status post IR percutaneous drain placement 07/03/2022 with cultures growing rare Candida albicans. -Diet being advanced per general surgery to soft diet and protein shakes. -Currently on TPN which has been weaned down per general surgery. -Continue twice daily WTD dressing changes to midline abdominal wound as recommended by general surgery. -Repeat CT abdomen and pelvis ordered per IR to follow-up on abscesses.  -Continue IV Zosyn, IV fluconazole as recommended per ID. -General surgery and ID following and appreciate input and recommendations.  2.  BPH -Foley can be difficult to place, urology consulted Foley catheter placed per urology. -Foley catheter discontinued and patient noted to have good urinary output with a urine output 3 L over the past 24 hours.  3.  Electrolyte abnormalities including hyponatremia/hypokalemia/hypomagnesemia/hypophosphatemia -Currently on TPN which has been weaned down. -Electrolyte replacement per pharmacy.  4.  Thrombocytosis -Likely secondary to acute infection. -Dr. Zigmund , discussed with hematology and felt it was likely reactive. -Patient with prior history of gastric ulcers and as such unable to tolerate aspirin at this time. -Thrombocytosis slowly trending down.  5.  Severe protein calorie malnutrition -Diet advanced to soft diet and patient started on protein shakes per general surgery. -Patient currently eating about 25% of his meals. -Currently on TPN which has been weaned down per general surgery..  6.  Normocytic anemia -Patient with no overt bleeding. -Hemoglobin currently stable at 7.3.  -Anemia panel with iron level of 17, TIBC 157, ferritin of 602, folate of 5.3. -Unable to receive IV iron due to acute infection. -Continue folic acid 1 mg daily.  7.  Hyponatremia -Likely secondary to hypervolemic hyponatremia in the  setting of hypoalbuminemia. -Patient on TPN. -Follow for now.  8. Pressure injury Pressure Injury 07/06/22 Coccyx Mid Stage 2 -  Partial thickness loss of dermis presenting as a shallow open injury with a red, pink wound bed without slough. (Active)  07/06/22 1307  Location: Coccyx  Location Orientation: Mid  Staging: Stage 2 -  Partial thickness loss of dermis presenting as a shallow open injury with a red, pink wound bed without slough.  Wound Description (Comments):   Present on Admission: No       DVT prophylaxis: Lovenox Code Status: Full Family Communication: Updated patient.  No family at bedside. Disposition: TBD  Status is: Inpatient Remains inpatient appropriate because: Severity of illness   Consultants:  PCCM: Dr. Loanne Drilling 06/25/2022 General surgery: Dr. Donne Hazel 06/24/2022 Urology: Dr. Alinda Money 06/25/2022 Interventional radiology: Dr. Anselm Pancoast 07/03/2022 Infectious disease: Dr. Baxter Flattery 07/07/2022  Procedures:  CT head 06/24/2022 CT abdomen and pelvis 07/01/2022 Chest x-ray 06/25/2022 Abdominal films 06/28/2022 MRI L-spine 06/24/2022 Right upper quadrant ultrasound 06/24/2022 CT drain placement left upper quadrant abscess per IR, Dr. Annamaria Boots 07/03/2022 Diagnostic laparoscopy, converted to expiratory laparotomy with small bowel resection and reanastomosis and temporary abdominal closure.  Dr.Stechschulte General surgery 06/25/2022. Expiratory laparotomy, washout, abdominal closure per general surgery: Dr.Stechschulte 06/27/2022 16 French coud catheter placement per urology: Dr. Alinda Money 06/25/2022  Significant Hospital Events: Including procedures, antibiotic start and stop dates in addition to other pertinent events   2/13 admit. [facility] consult for chole eval given known hx gallstones  2/14 intended lap chole --> ex lap with identification of small bowel perfs req resection + feculent peritonitis. To ICU post op intubated. Added zosyn  2/15 remains intubated sedated w open abd  2/16  OR for washout and abdominal closure, adding D5 with hypoglycemia on low rate TPN  2/17 extubated   Antimicrobials:  Anti-infectives (From admission, onward)    Start     Dose/Rate Route Frequency Ordered Stop   07/07/22 1215  fluconazole (DIFLUCAN) IVPB 400 mg        400 mg 100 mL/hr over 120 Minutes Intravenous Every 24 hours 07/07/22 1118     06/26/22 0000  piperacillin-tazobactam (ZOSYN) IVPB 3.375 g        3.375 g 12.5 mL/hr over 240 Minutes Intravenous Every 8 hours 06/25/22 1832     06/25/22 1815  piperacillin-tazobactam (ZOSYN) IVPB 3.375 g        3.375 g 100 mL/hr over 30 Minutes Intravenous  Once 06/25/22 1802 06/25/22 1830   06/25/22 1756  piperacillin-tazobactam (ZOSYN) 3.375 GM/50ML IVPB       Note to Pharmacy: Ward, Christa K: cabinet override      06/25/22 1756 06/27/22 0333   06/25/22 1630  ceFAZolin (ANCEF) IVPB 2g/100 mL premix        2 g 200 mL/hr over 30 Minutes Intravenous  Once 06/25/22 1534 06/25/22 1640         Subjective: Laying in bed.  Abdominal wound dressing changes in process with ABD dressing being placed.  No chest pain.  No shortness of breath.  Still with some diffuse abdominal pain.  Having bowel movement.  Eating about 25% of his diet.    Objective: Vitals:   07/09/22 1500 07/09/22 2036 07/10/22 0456 07/10/22 1357  BP:  (!) 131/90 (!) 128/92 127/79  Pulse:  91 90 (!) 110  Resp:  '18 18 14  '$ Temp:  98.8 F (37.1 C) 98.2 F (36.8 C) 97.8 F (36.6 C)  TempSrc:  Oral Oral Oral  SpO2:  100% 100% 100%  Weight: 51.4 kg     Height:        Intake/Output Summary (Last 24 hours) at 07/10/2022 1726 Last data filed at 07/10/2022 1400 Gross per 24 hour  Intake 4820.62 ml  Output 2555 ml  Net 2265.62 ml   Filed Weights   06/26/22 1045 07/03/22 1014 07/09/22 1500  Weight: 48.3 kg 48 kg 51.4 kg    Examination:  General exam: NAD Respiratory system: CTAB.  No wheezes, no crackles, no rhonchi.  Fair air movement.  Speaking in full sentences.   Cardiovascular system: Regular rate rhythm no murmurs rubs or gallops.  No JVD.  No lower extremity edema.   Gastrointestinal system: Abdomen is soft, some diffuse tenderness to palpation, nondistended, postop bandage on anterior abdomen.  JP drain left upper quadrant with some whitish purulent drainage.  Positive bowel sounds.  No rebound.  No guarding.   Central nervous system: Alert and oriented. No focal neurological deficits. Extremities: Symmetric 5 x 5 power. Skin: No rashes, lesions or ulcers Psychiatry: Judgement and insight appear normal. Mood & affect appropriate.     Data Reviewed: I have personally reviewed following labs and imaging studies  CBC: Recent Labs  Lab 07/06/22 0301 07/07/22 0305 07/08/22 0447 07/09/22 0113 07/10/22 0233  WBC 21.7* 18.8* 17.4* 12.5* 10.3  NEUTROABS 17.3* 15.1* 13.6* 8.7* 6.7  HGB 8.1* 7.8* 7.4* 7.6* 7.3*  HCT 23.4* 23.0* 21.9* 22.6* 21.7*  MCV 83.3 84.9 85.2 85.3 85.4  PLT 991* 1,070* 1,048*  1,037* 1,146* 1,025*    Basic Metabolic Panel: Recent Labs  Lab 07/05/22 0335 07/06/22 0301 07/07/22 0305 07/08/22 0447 07/09/22 0113 07/10/22 0233  NA 131* 132*  --  127* 134* 129*  K 4.2 4.1  --  3.9 4.0 4.0  CL 98 100  --  96* 98 97*  CO2 24 24  --  '25 27 25  '$ GLUCOSE 105* 107*  --  106* 100* 96  BUN 12 12  --  '13 11 9  '$ CREATININE 0.51* 0.48*  --  0.51* 0.53* 0.60*  CALCIUM 8.5* 8.4*  --  8.3* 8.7* 8.4*  MG 1.7 2.2 1.6* 1.9 2.3 1.9  PHOS 3.4 3.2 3.0 3.4 3.5 4.0    GFR: Estimated Creatinine Clearance: 93.7 mL/min (A) (by C-G formula based on SCr of 0.6 mg/dL (L)).  Liver Function Tests: Recent Labs  Lab 07/05/22 0335 07/06/22 0301 07/08/22 0447 07/09/22 0113 07/10/22 0233  AST '16 15 15 17 15  '$ ALT '18 17 19 20 17  '$ ALKPHOS 135* 133* 153* 147* 110  BILITOT 0.7 0.8 0.5 0.6 0.4  PROT 6.3* 6.1* 7.0 7.3 6.6  ALBUMIN 2.3* 2.2* 2.4* 2.7* 2.4*    CBG: No results for input(s): "GLUCAP" in the last 168 hours.   Recent Results  (from the past 240 hour(s))  Aerobic/Anaerobic Culture w Gram Stain (surgical/deep wound)     Status: None   Collection Time: 07/03/22  3:34 PM   Specimen: Abscess  Result Value Ref Range Status   Specimen Description   Final    ABSCESS Performed at [facility] [address] [place], [place] [address]    Special Requests   Final    Normal Performed at [facility], [place] [address], [place], [place] [address]    Gram Stain   Final    RARE WBC PRESENT,BOTH PMN AND MONONUCLEAR RARE BUDDING YEAST SEEN    Culture   Final  RARE CANDIDA ALBICANS NO ANAEROBES ISOLATED Performed at [facility], [place] [address], [place], [place] [address]    Report Status 07/08/2022 FINAL  Final         Radiology Studies: CT ABDOMEN PELVIS W CONTRAST  Result Date: 07/10/2022 CLINICAL DATA:  Intra-abdominal abscess. Sepsis. Small bowel perforation. EXAM: CT ABDOMEN AND PELVIS WITH CONTRAST TECHNIQUE: Multidetector CT imaging of the abdomen and pelvis was performed using the standard protocol following bolus administration of intravenous contrast. RADIATION DOSE REDUCTION: This exam was performed according to the departmental dose-optimization program which includes automated exposure control, adjustment of the mA and/or kV according to patient size and/or use of iterative reconstruction technique. CONTRAST:  183m OMNIPAQUE IOHEXOL 300 MG/ML  SOLN COMPARISON:  07/03/2022 FINDINGS: Lower chest: LEFT pleural effusion and LEFT basilar atelectasis. Heart size is normal. Hepatobiliary: No focal liver abnormality is seen. No radiopaque gallstones, biliary dilatation, or pericholecystic inflammatory changes. Small amount of gas identified anterior to the liver, consistent with free intraperitoneal air from recent surgery. Pancreas: Unremarkable. No pancreatic ductal dilatation or surrounding inflammatory changes. Spleen: Normal in size without focal abnormality.  Adrenals/Urinary Tract: Adrenal glands are unremarkable. Kidneys are symmetric in size. No suspicious renal lesion. No hydronephrosis. Visualized courses of the ureters are unremarkable. The bladder and visualized portion of the urethra are normal. Stomach/Bowel: Stomach is normal in appearance. The small bowel loops are mildly dilated without evidence for obstruction. Bowel sutures are identified in the RIGHT mid abdomen. Air-fluid levels throughout the bowel loops. There has been improvement in small fluid collection in the RIGHT mid abdomen. No evidence for abscess. Vascular/Lymphatic: There is minimal atherosclerotic calcification of the abdominal aorta. Reproductive: Prostate is unremarkable. Other: Interval placement of LEFT sub diaphragmatic catheter. A small crescentic fluid collection persists, measuring approximately 7 centimeters in length and 1.3 centimeters in width (image 66 of series 5). Cachectic. Diffuse body wall edema. Musculoskeletal: No acute or significant osseous findings. IMPRESSION: 1. Interval placement of LEFT sub diaphragmatic catheter. There has been improvement in this fluid collection with small residual crescentic collection, measuring 7 centimeters in length and 1.3 centimeters in width. 2. Small locules of free intraperitoneal air, consistent with recent surgery. 3. Mildly dilated small bowel loops without evidence for obstruction. 4. Air-fluid levels throughout the bowel loops, consistent with ileus. 5. LEFT pleural effusion and LEFT basilar atelectasis. 6. Cachectic. Diffuse body wall edema. Electronically Signed   By: ENolon NationsM.D.   On: 07/10/2022 13:02        Scheduled Meds:  acetaminophen  1,000 mg Oral Q6H   Chlorhexidine Gluconate Cloth  6 each Topical Q0600   enoxaparin (LOVENOX) injection  40 mg Subcutaneous Daily   feeding supplement  237 mL Oral TID BM   fentaNYL  1 patch Transdermal Q72H   fluticasone  1 spray Each Nare Daily   folic acid  1 mg Oral  Daily   methocarbamol  1,000 mg Oral QID   pantoprazole (PROTONIX) IV  40 mg Intravenous Q24H   simethicone  80 mg Oral QID   sodium chloride (PF)       sodium chloride flush  10-40 mL Intracatheter Q12H   sodium chloride flush  5 mL Intracatheter Q8H   Continuous Infusions:  sodium chloride Stopped (06/27/22 0832)   sodium chloride 60 mL/hr at 07/08/22 2250   fluconazole (DIFLUCAN) IV 400 mg (07/10/22 1234)   piperacillin-tazobactam 3.375 g (07/10/22 1602)   TPN ADULT (ION) Stopped (07/10/22 1705)   TPN ADULT (  ION) 40 mL/hr at 07/10/22 1703     LOS: 14 days    Time spent: 35 minutes    Irine Seal, MD [facility]   To contact the attending provider between 7A-7P or the covering provider during after hours 7P-7A, please log into the web site [email or web address] and access using universal [place] password for that web site. If you do not have the password, please call the hospital operator.  07/10/2022, 5:26 PM

## 2022-07-10 NOTE — Progress Notes (Signed)
Referring Physician(s): Dr. Stark Klein  Supervising Physician: Corrie Mckusick  Patient Status:  [facility] - In-pt  Chief Complaint: Abdominal perforation s/p SBR 2/14 LUQ fluid collection s/p drain placement 2/22  Subjective: Patient sitting up in room.   Remains on TPN No complaints related to drain.  S/p CT Abdomen Pelvis today.   Allergies: Aspirin  Medications: Prior to Admission medications   Medication Sig Start Date End Date Taking? Authorizing Provider  acetaminophen (TYLENOL) 500 MG tablet Take 1,000 mg by mouth as needed for moderate pain.   Yes [provider]  ciprofloxacin (CIPRO) 500 MG tablet Take 500 mg by mouth 2 (two) times daily. 06/20/22  Yes [provider]  dicyclomine (BENTYL) 20 MG tablet Take 1 tablet (20 mg total) by mouth 2 (two) times daily. Patient taking differently: Take 20 mg by mouth daily as needed for spasms. 05/28/22  Yes Zehr, Laban Emperor, PA-C  linaclotide (LINZESS) 145 MCG CAPS capsule Take 1 capsule (145 mcg total) by mouth daily before breakfast. Patient taking differently: Take 145 mcg by mouth as needed (constipation). 04/23/22  Yes Zehr, Laban Emperor, PA-C  pantoprazole (PROTONIX) 40 MG tablet Take 1 tablet (40 mg total) by mouth 2 (two) times daily. 04/23/22  Yes Zehr, Laban Emperor, PA-C  sucralfate (CARAFATE) 1 g tablet Take 1 tablet (1 g total) by mouth 4 (four) times daily -  with meals and at bedtime. Patient taking differently: Take 1 g by mouth as needed (heartburn). 05/28/22  Yes Zehr, Laban Emperor, PA-C  tamsulosin (FLOMAX) 0.4 MG CAPS capsule Take 0.4 mg by mouth daily. 06/20/22  Yes [provider]  ketorolac (TORADOL) 10 MG tablet Take 10 mg by mouth every 6 (six) hours as needed. Patient not taking: Reported on 06/25/2022 06/20/22   [provider]  oxyCODONE-acetaminophen (PERCOCET/ROXICET) 5-325 MG tablet Take 1 tablet by mouth every 6 (six) hours as needed for severe pain. Patient not taking: Reported on  06/25/2022 05/18/22   Rex Kras, PA  promethazine (PHENERGAN) 25 MG tablet Take 1 tablet (25 mg total) by mouth every 6 (six) hours as needed for nausea or vomiting. Patient not taking: Reported on 06/25/2022 05/18/22   Rex Kras, PA     Vital Signs: BP 127/79 (BP Location: Left Arm)   Pulse (!) 110   Temp 97.8 F (36.6 C) (Oral)   Resp 14   Ht '5\' 8"'$  (1.727 m)   Wt 113 lb 4.8 oz (51.4 kg)   SpO2 100%   BMI 17.23 kg/m   Physical Exam NAD, alert Abdomen: LUQ drain remains in place.  Insertion site c/d/I.  Flushes and aspirates easily. Beige, purulent-appearing output in bulb.  Imaging: CT ABDOMEN PELVIS W CONTRAST  Result Date: 07/10/2022 CLINICAL DATA:  Intra-abdominal abscess. Sepsis. Small bowel perforation. EXAM: CT ABDOMEN AND PELVIS WITH CONTRAST TECHNIQUE: Multidetector CT imaging of the abdomen and pelvis was performed using the standard protocol following bolus administration of intravenous contrast. RADIATION DOSE REDUCTION: This exam was performed according to the departmental dose-optimization program which includes automated exposure control, adjustment of the mA and/or kV according to patient size and/or use of iterative reconstruction technique. CONTRAST:  17m OMNIPAQUE IOHEXOL 300 MG/ML  SOLN COMPARISON:  07/03/2022 FINDINGS: Lower chest: LEFT pleural effusion and LEFT basilar atelectasis. Heart size is normal. Hepatobiliary: No focal liver abnormality is seen. No radiopaque gallstones, biliary dilatation, or pericholecystic inflammatory changes. Small amount of gas identified anterior to the liver, consistent with free intraperitoneal air from recent  surgery. Pancreas: Unremarkable. No pancreatic ductal dilatation or surrounding inflammatory changes. Spleen: Normal in size without focal abnormality. Adrenals/Urinary Tract: Adrenal glands are unremarkable. Kidneys are symmetric in size. No suspicious renal lesion. No hydronephrosis. Visualized courses of the ureters are unremarkable.  The bladder and visualized portion of the urethra are normal. Stomach/Bowel: Stomach is normal in appearance. The small bowel loops are mildly dilated without evidence for obstruction. Bowel sutures are identified in the RIGHT mid abdomen. Air-fluid levels throughout the bowel loops. There has been improvement in small fluid collection in the RIGHT mid abdomen. No evidence for abscess. Vascular/Lymphatic: There is minimal atherosclerotic calcification of the abdominal aorta. Reproductive: Prostate is unremarkable. Other: Interval placement of LEFT sub diaphragmatic catheter. A small crescentic fluid collection persists, measuring approximately 7 centimeters in length and 1.3 centimeters in width (image 66 of series 5). Cachectic. Diffuse body wall edema. Musculoskeletal: No acute or significant osseous findings. IMPRESSION: 1. Interval placement of LEFT sub diaphragmatic catheter. There has been improvement in this fluid collection with small residual crescentic collection, measuring 7 centimeters in length and 1.3 centimeters in width. 2. Small locules of free intraperitoneal air, consistent with recent surgery. 3. Mildly dilated small bowel loops without evidence for obstruction. 4. Air-fluid levels throughout the bowel loops, consistent with ileus. 5. LEFT pleural effusion and LEFT basilar atelectasis. 6. Cachectic. Diffuse body wall edema. Electronically Signed   By: Nolon Nations M.D.   On: 07/10/2022 13:02    Labs:  CBC: Recent Labs    07/07/22 0305 07/08/22 0447 07/09/22 0113 07/10/22 0233  WBC 18.8* 17.4* 12.5* 10.3  HGB 7.8* 7.4* 7.6* 7.3*  HCT 23.0* 21.9* 22.6* 21.7*  PLT 1,070* 1,048*  1,037* 1,146* 1,025*    COAGS: Recent Labs    07/03/22 0334 07/08/22 0447  INR 1.1 1.2  APTT  --  34    BMP: Recent Labs    07/06/22 0301 07/08/22 0447 07/09/22 0113 07/10/22 0233  NA 132* 127* 134* 129*  K 4.1 3.9 4.0 4.0  CL 100 96* 98 97*  CO2 '24 25 27 25  '$ GLUCOSE 107* 106* 100*  96  BUN '12 13 11 9  '$ CALCIUM 8.4* 8.3* 8.7* 8.4*  CREATININE 0.48* 0.51* 0.53* 0.60*  GFRNONAA >60 >60 >60 >60    LIVER FUNCTION TESTS: Recent Labs    07/06/22 0301 07/08/22 0447 07/09/22 0113 07/10/22 0233  BILITOT 0.8 0.5 0.6 0.4  AST '15 15 17 15  '$ ALT '17 19 20 17  '$ ALKPHOS 133* 153* 147* 110  PROT 6.1* 7.0 7.3 6.6  ALBUMIN 2.2* 2.4* 2.7* 2.4*    Assessment and Plan: s/p ex lap with SBR and washout for feculent peritonitis for SB perforation from SBO by Dr. Thermon Leyland  06/25/22  S/p LUQ drain placement 07/03/22 by Dr. Annamaria Boots for intra-abdominal fluid collection.  Drain remains in place.   Output 35-90 mL/day.  Beige, purulent-appearing.  WBC improving.  PO intake remains suboptimal but is improving.   Plan: Continue TID flushes with 5 cc NS. Record output Q shift. Dressing changes QD or PRN if soiled.  Call IR APP or on call IR MD if difficulty flushing or sudden change in drain output.  Repeat imaging/possible drain injection once output < 10 mL/QD (excluding flush material). Consideration for drain removal if output is < 10 mL/QD (excluding flush material), pending discussion with the providing surgical service.  Discharge planning: Please contact IR APP or on call IR MD prior to patient d/c to ensure appropriate follow up  plans are in place. Typically patient will follow up with IR clinic 10-14 days post d/c for repeat imaging/possible drain injection. IR scheduler will contact patient with date/time of appointment. Patient will need to flush drain QD with 5 cc NS, record output QD, dressing changes every 2-3 days or earlier if soiled.   IR will continue to follow - please call with questions or concerns.  Electronically Signed: Docia Barrier, PA 07/10/2022, 3:41 PM   I spent a total of 15 Minutes at the the patient's bedside AND on the patient's hospital floor or unit, greater than 50% of which was counseling/coordinating care for perisplenic fluid  collection.

## 2022-07-10 NOTE — Progress Notes (Signed)
Nutrition Follow-up  DOCUMENTATION CODES:   Severe malnutrition in context of chronic illness  INTERVENTION:   -Continue Ensure Plus High Protein po TID, each supplement provides 350 kcal and 20 grams of protein.   -Magic cup BID with meals, each supplement provides 290 kcal and 9 grams of protein    -TPN management per Pharmacy -recommend continue until patient can consume >50% of meals  NUTRITION DIAGNOSIS:   Severe Malnutrition related to chronic illness as evidenced by severe fat depletion, severe muscle depletion, percent weight loss (22% weight loss in 11 months).  Ongoing.  GOAL:   Patient will meet greater than or equal to 90% of their needs  Progressing.  MONITOR:   PO intake, Supplement acceptance, Diet advancement, Weight trends  REASON FOR ASSESSMENT:   Consult Other (Comment)  ASSESSMENT:   37 yo male with of chronic abdomen pain and weight loss presented to the ED for further evaluation. He was being followed by gastroenterology lumbar as outpatient and has had extensive workup in past 3 years for similar presentation. Admitted after being found to have cholelithiasis.  2/13 Admit 2/14 ex-lap, found to have SB perf, s/p small bowel resection and reanastomosis, and temporary abdominal closure; remained intubated afterwards 2/16 washout and abdominal closure 2/17 Extubated 2/22 s/p drain  Patient in room, states he is doing okay. Still having some abdominal pain. States he had a few bites of his lunch yesterday (salmon, mashed potatoes). Felt better after having a BM. He drank 1.5 Ensures yesterday. This morning he did eat 1.5 pancakes, all his bacon and a few bites of potatoes for breakfast. Just completed 1 Ensure before RD visit.  Requested a lemon lime soda at end of visit and RD provided one. Encouraged pt to continue to eat as much as he can tolerate. Pt expressed there is still some fear of eating given this was his issue PTA. Pt states he aims to  drink 3 Ensures today. RD discussed other protein options but pt politely declined them. Will trial Magic cups on lunch/dinner trays.   TPN at 40 ml/hr currently, providing ~1019 kcals, 48g protein.  Admission weight: 108 lbs  Current weight: 113 lbs  Medications: Folic acid, IV Mg Sulfate  Labs reviewed: CBGs: 105 Low Na   Diet Order:   Diet Order             DIET SOFT Room service appropriate? Yes; Fluid consistency: Thin  Diet effective now                   EDUCATION NEEDS:   Education needs have been addressed  Skin:  Skin Assessment: Skin Integrity Issues: Skin Integrity Issues:: Stage II Stage II: mid coccyx Incisions: Abdomen  Last BM:  2/29 -pt reports BM today after breakfast  Height:   Ht Readings from Last 1 Encounters:  06/25/22 '5\' 8"'$  (1.727 m)    Weight:   Wt Readings from Last 1 Encounters:  07/09/22 51.4 kg    BMI:  Body mass index is 17.23 kg/m.  Estimated Nutritional Needs:   Kcal:  1950-2100 kcals  Protein:  90-100 grams  Fluid:  >/= 1.9L   Clayton Bibles, MS, RD, LDN Inpatient Clinical Dietitian Contact information available via Amion

## 2022-07-10 NOTE — Progress Notes (Addendum)
Patient ID: Gabriel Dalton, male   DOB: 10-Sep-1985, 37 y.o.   MRN: [id number] [facility] Surgery Progress Note  13 Days Post-Op  Subjective: Feeling better today. Appetite slowly improving. Ate 25-40% meals and 1.5 ensure yesterday. Having regular bowel movements. WBC normal.  Objective: Vital signs in last 24 hours: Temp:  [98.2 F (36.8 C)-98.8 F (37.1 C)] 98.2 F (36.8 C) (02/29 0456) Pulse Rate:  [90-107] 90 (02/29 0456) Resp:  [14-18] 18 (02/29 0456) BP: (117-131)/(90-92) 128/92 (02/29 0456) SpO2:  [100 %] 100 % (02/29 0456) Weight:  [51.4 kg] 51.4 kg (02/28 1500) Last BM Date : 07/09/22  Intake/Output from previous day: 02/28 0701 - 02/29 0700 In: 4476.2 [P.O.:1320; I.V.:2470.8; IV Piggyback:680.3] Out: 3070 [Urine:3000; Drains:70] Intake/Output this shift: No intake/output data recorded.  PE: Gen:  Alert, NAD, pleasant Pulm:  rate and effort normal on room air Abd: Soft, nondistended, clean dressings in place over midline wound, drain purulent.  Lab Results:  Recent Labs    07/09/22 0113 07/10/22 0233  WBC 12.5* 10.3  HGB 7.6* 7.3*  HCT 22.6* 21.7*  PLT 1,146* 1,025*   BMET Recent Labs    07/09/22 0113 07/10/22 0233  NA 134* 129*  K 4.0 4.0  CL 98 97*  CO2 27 25  GLUCOSE 100* 96  BUN 11 9  CREATININE 0.53* 0.60*  CALCIUM 8.7* 8.4*   PT/INR Recent Labs    07/08/22 0447  LABPROT 14.6  INR 1.2   CMP     Component Value Date/Time   NA 129 (L) 07/10/2022 0233   K 4.0 07/10/2022 0233   CL 97 (L) 07/10/2022 0233   CO2 25 07/10/2022 0233   GLUCOSE 96 07/10/2022 0233   BUN 9 07/10/2022 0233   CREATININE 0.60 (L) 07/10/2022 0233   CALCIUM 8.4 (L) 07/10/2022 0233   PROT 6.6 07/10/2022 0233   ALBUMIN 2.4 (L) 07/10/2022 0233   AST 15 07/10/2022 0233   ALT 17 07/10/2022 0233   ALKPHOS 110 07/10/2022 0233   BILITOT 0.4 07/10/2022 0233   GFRNONAA >60 07/10/2022 0233   GFRAA >60 04/28/2019 1313   Lipase     Component Value  Date/Time   LIPASE 30 06/24/2022 1400       Studies/Results: No results found.  Anti-infectives: Anti-infectives (From admission, onward)    Start     Dose/Rate Route Frequency Ordered Stop   07/07/22 1215  fluconazole (DIFLUCAN) IVPB 400 mg        400 mg 100 mL/hr over 120 Minutes Intravenous Every 24 hours 07/07/22 1118     06/26/22 0000  piperacillin-tazobactam (ZOSYN) IVPB 3.375 g        3.375 g 12.5 mL/hr over 240 Minutes Intravenous Every 8 hours 06/25/22 1832     06/25/22 1815  piperacillin-tazobactam (ZOSYN) IVPB 3.375 g        3.375 g 100 mL/hr over 30 Minutes Intravenous  Once 06/25/22 1802 06/25/22 1830   06/25/22 1756  piperacillin-tazobactam (ZOSYN) 3.375 GM/50ML IVPB       Note to Pharmacy: Ward, Christa K: cabinet override      06/25/22 1756 06/27/22 0333   06/25/22 1630  ceFAZolin (ANCEF) IVPB 2g/100 mL premix        2 g 200 mL/hr over 30 Minutes Intravenous  Once 06/25/22 1534 06/25/22 1640        Assessment/Plan POD 15/13 s/p ex lap with SBR and washout for feculent peritonitis for SB perforation from SBO by Dr. Thermon Leyland  06/25/22; S/p  ex lap abdominal closure 2/16 - Feculent peritonitis found in OR with 2 SB perforations noted from obstructive process.  Unclear etiology. Mother had something similar and wife states sister is having some GI issues, ? Need for further work up to rule out [employer] or other etiology - S/p IR perc drain 2/22. Cx's with Rare Candida Albicans  - discussed with ID, will add IV fluconazole to his IV zosyn. WBC normalized.  - Continue soft diet and protein shakes. Wean TPN to 1/2 rate tonight; I will ask nutrition to weigh in on additional supplements high in calories/protein.  - Continue bid wtd dressing changes to midline abdominal wound; add abdominal binder while OOB. - Pathology on small bowel benign with significant luminal narrowing at one point - Mobilize, pulm toilet - noted IR ordered CT scan today, will follow  results    FEN - Soft diet, continue TPN 1/2 rate VTE - SCDs, Lovenox ID - Zosyn 2/13 -->, fluconazole 2/26>> Foley - out 2/21, voiding well   Post op acute respiratory failure - resolved    LOS: 14 days    Jill Alexanders, [facility] Surgery 07/10/2022, 9:48 AM Please see Amion for pager number during day hours 7:00am-4:30pm

## 2022-07-11 ENCOUNTER — Other Ambulatory Visit (HOSPITAL_COMMUNITY): Payer: Self-pay

## 2022-07-11 DIAGNOSIS — E43 Unspecified severe protein-calorie malnutrition: Secondary | ICD-10-CM | POA: Diagnosis not present

## 2022-07-11 DIAGNOSIS — K651 Peritoneal abscess: Secondary | ICD-10-CM | POA: Diagnosis not present

## 2022-07-11 DIAGNOSIS — R188 Other ascites: Secondary | ICD-10-CM

## 2022-07-11 DIAGNOSIS — K8020 Calculus of gallbladder without cholecystitis without obstruction: Secondary | ICD-10-CM | POA: Diagnosis not present

## 2022-07-11 DIAGNOSIS — K631 Perforation of intestine (nontraumatic): Secondary | ICD-10-CM | POA: Diagnosis not present

## 2022-07-11 DIAGNOSIS — K659 Peritonitis, unspecified: Secondary | ICD-10-CM | POA: Diagnosis not present

## 2022-07-11 HISTORY — DX: Other ascites: R18.8

## 2022-07-11 LAB — CBC WITH DIFFERENTIAL/PLATELET
Abs Immature Granulocytes: 0.05 10*3/uL (ref 0.00–0.07)
Basophils Absolute: 0 10*3/uL (ref 0.0–0.1)
Basophils Relative: 0 %
Eosinophils Absolute: 0.2 10*3/uL (ref 0.0–0.5)
Eosinophils Relative: 2 %
HCT: 22.5 % — ABNORMAL LOW (ref 39.0–52.0)
Hemoglobin: 7.5 g/dL — ABNORMAL LOW (ref 13.0–17.0)
Immature Granulocytes: 1 %
Lymphocytes Relative: 17 %
Lymphs Abs: 1.7 10*3/uL (ref 0.7–4.0)
MCH: 29.4 pg (ref 26.0–34.0)
MCHC: 33.3 g/dL (ref 30.0–36.0)
MCV: 88.2 fL (ref 80.0–100.0)
Monocytes Absolute: 1 10*3/uL (ref 0.1–1.0)
Monocytes Relative: 10 %
Neutro Abs: 7 10*3/uL (ref 1.7–7.7)
Neutrophils Relative %: 70 %
Platelets: 982 10*3/uL (ref 150–400)
RBC: 2.55 MIL/uL — ABNORMAL LOW (ref 4.22–5.81)
RDW: 17.6 % — ABNORMAL HIGH (ref 11.5–15.5)
WBC: 10 10*3/uL (ref 4.0–10.5)
nRBC: 0 % (ref 0.0–0.2)

## 2022-07-11 LAB — BASIC METABOLIC PANEL
Anion gap: 7 (ref 5–15)
BUN: 10 mg/dL (ref 6–20)
CO2: 25 mmol/L (ref 22–32)
Calcium: 8.6 mg/dL — ABNORMAL LOW (ref 8.9–10.3)
Chloride: 101 mmol/L (ref 98–111)
Creatinine, Ser: 0.55 mg/dL — ABNORMAL LOW (ref 0.61–1.24)
GFR, Estimated: >60 mL/min (ref >60)
Glucose, Bld: 295 mg/dL — ABNORMAL HIGH (ref 70–99)
Potassium: 5 mmol/L (ref 3.5–5.1)
Sodium: 133 mmol/L — ABNORMAL LOW (ref 135–145)

## 2022-07-11 LAB — MAGNESIUM: Magnesium: 2.1 mg/dL (ref 1.7–2.4)

## 2022-07-11 LAB — PHOSPHORUS: Phosphorus: 4.5 mg/dL (ref 2.5–4.6)

## 2022-07-11 MED ORDER — FOLIC ACID 1 MG PO TABS: 1.0000 mg | ORAL_TABLET | Freq: Every day | ORAL | Status: DC

## 2022-07-11 MED ORDER — PANTOPRAZOLE SODIUM 40 MG PO TBEC
40.0000 mg | DELAYED_RELEASE_TABLET | Freq: Every day | ORAL | Status: DC
  Administered 2022-07-11: 40 mg via ORAL
  Filled 2022-07-11: qty 1

## 2022-07-11 MED ORDER — SIMETHICONE 80 MG PO CHEW: 80.0000 mg | CHEWABLE_TABLET | Freq: Four times a day (QID) | ORAL | 0 refills | Status: DC | PRN

## 2022-07-11 MED ORDER — METHOCARBAMOL 1000 MG PO TABS: 1000.0000 mg | ORAL_TABLET | Freq: Four times a day (QID) | ORAL | 1 refills | Status: DC

## 2022-07-11 MED ORDER — HYDROMORPHONE HCL 1 MG/ML IJ SOLN
0.5000 mg | Freq: Four times a day (QID) | INTRAMUSCULAR | Status: DC | PRN
  Administered 2022-07-11 (×2): 1 mg via INTRAVENOUS
  Administered 2022-07-12: 2 mg via INTRAVENOUS
  Administered 2022-07-12: 1 mg via INTRAVENOUS
  Filled 2022-07-11: qty 2
  Filled 2022-07-11 (×3): qty 1

## 2022-07-11 MED ORDER — ONDANSETRON HCL 4 MG PO TABS
4.0000 mg | ORAL_TABLET | Freq: Three times a day (TID) | ORAL | 0 refills | Status: DC | PRN
  Filled 2022-07-11: qty 20, 7d supply, fill #0

## 2022-07-11 MED ORDER — ACETAMINOPHEN 500 MG PO TABS: 1000.0000 mg | ORAL_TABLET | Freq: Four times a day (QID) | ORAL | 0 refills | Status: DC

## 2022-07-11 MED ORDER — FLUTICASONE PROPIONATE 50 MCG/ACT NA SUSP
1.0000 | Freq: Every day | NASAL | 0 refills | Status: DC
  Filled 2022-07-11: qty 16, 30d supply, fill #0

## 2022-07-11 MED ORDER — AMOXICILLIN-POT CLAVULANATE 875-125 MG PO TABS
1.0000 | ORAL_TABLET | Freq: Two times a day (BID) | ORAL | 0 refills | Status: AC
  Filled 2022-07-11: qty 20, 10d supply, fill #0

## 2022-07-11 MED ORDER — NORMAL SALINE FLUSH 0.9 % IV SOLN
5.0000 mL | Freq: Every day | INTRAVENOUS | 2 refills | Status: DC
  Filled 2022-07-11: qty 300, 30d supply, fill #0
  Filled 2022-07-21 – 2022-07-29 (×2): qty 300, 30d supply, fill #1

## 2022-07-11 MED ORDER — FLUCONAZOLE 200 MG PO TABS
400.0000 mg | ORAL_TABLET | Freq: Every day | ORAL | 0 refills | Status: AC
  Filled 2022-07-11: qty 20, 10d supply, fill #0

## 2022-07-11 MED ORDER — ALBUMIN HUMAN 25 % IV SOLN
25.0000 g | Freq: Once | INTRAVENOUS | Status: AC
  Administered 2022-07-11: 12.5 g via INTRAVENOUS
  Filled 2022-07-11: qty 100

## 2022-07-11 MED ORDER — PANTOPRAZOLE SODIUM 40 MG PO TBEC
40.0000 mg | DELAYED_RELEASE_TABLET | Freq: Every day | ORAL | 2 refills | Status: DC
  Filled 2022-07-11: qty 30, 30d supply, fill #0
  Filled 2022-08-20: qty 30, 30d supply, fill #1

## 2022-07-11 MED ORDER — FUROSEMIDE 10 MG/ML IJ SOLN
20.0000 mg | Freq: Once | INTRAMUSCULAR | Status: AC
  Administered 2022-07-11: 20 mg via INTRAVENOUS
  Filled 2022-07-11: qty 2

## 2022-07-11 MED ORDER — ENSURE ENLIVE PO LIQD: 237.0000 mL | Freq: Three times a day (TID) | ORAL | 12 refills | Status: DC

## 2022-07-11 MED ORDER — OXYCODONE HCL 10 MG PO TABS: 10.0000 mg | ORAL_TABLET | Freq: Four times a day (QID) | ORAL | 0 refills | Status: DC | PRN

## 2022-07-11 NOTE — Progress Notes (Addendum)
[facility] for Infectious Disease    Date of Admission:  06/24/2022   Total days of antibiotics 15   ID: Gabriel Dalton is a 37 y.o. male with   Principal Problem:   Cholelithiasis Active Problems:   Pain of upper abdomen   Severe protein-calorie malnutrition (HCC)   Small bowel perforation (HCC)   Endotracheal tube present   Gallstones   Peritonitis (HCC)   Endotracheally intubated   Electrolyte abnormality   Benign prostatic hyperplasia    Subjective: Afebrile, wbc normalized in the last 2 days. Tolerating more by mouth  Had repeat CT yesterday that should smaller fluid collection at 7 cm x 1.3cm  Medications:   acetaminophen  1,000 mg Oral Q6H   Chlorhexidine Gluconate Cloth  6 each Topical Q0600   enoxaparin (LOVENOX) injection  40 mg Subcutaneous Daily   feeding supplement  237 mL Oral TID BM   fentaNYL  1 patch Transdermal Q72H   fluticasone  1 spray Each Nare Daily   folic acid  1 mg Oral Daily   methocarbamol  1,000 mg Oral QID   pantoprazole  40 mg Oral Daily   simethicone  80 mg Oral QID   sodium chloride flush  10-40 mL Intracatheter Q12H   sodium chloride flush  5 mL Intracatheter Q8H    Objective: Vital signs in last 24 hours: Temp:  [97.8 F (36.6 C)-98.5 F (36.9 C)] 98.5 F (36.9 C) (03/01 1226) Pulse Rate:  [92-110] 93 (03/01 1226) Resp:  [14-16] 16 (03/01 1226) BP: (120-132)/(79-89) 132/89 (03/01 1226) SpO2:  [100 %] 100 % (03/01 1226)  Physical Exam  Constitutional: He is oriented to person, place, and time. He appears well-developed and well-nourished. No distress.  HENT:  Mouth/Throat: Oropharynx is clear and moist. No oropharyngeal exudate.  Cardiovascular: Normal rate, regular rhythm and normal heart sounds. Exam reveals no gallop and no friction rub.  No murmur heard.  Pulmonary/Chest: Effort normal and breath sounds normal. No respiratory distress. He has no wheezes.  Abdominal: Soft. Bowel sounds are normal. He  exhibits no distension. There is no tenderness. Drain with milky fluid Lymphadenopathy:  He has no cervical adenopathy.  Neurological: He is alert and oriented to person, place, and time.  Skin: Skin is warm and dry. No rash noted. No erythema.  Psychiatric: He has a normal mood and affect. His behavior is normal.    Lab Results Recent Labs    07/10/22 0233 07/11/22 0447  WBC 10.3 10.0  HGB 7.3* 7.5*  HCT 21.7* 22.5*  NA 129* 133*  K 4.0 5.0  CL 97* 101  CO2 25 25  BUN 9 10  CREATININE 0.60* 0.55*   Liver Panel Recent Labs    07/09/22 0113 07/10/22 0233  PROT 7.3 6.6  ALBUMIN 2.7* 2.4*  AST 17 15  ALT 20 17  ALKPHOS 147* 110  BILITOT 0.6 0.4    Microbiology: + c.albicans Studies/Results: CT ABDOMEN PELVIS W CONTRAST  Result Date: 07/10/2022 CLINICAL DATA:  Intra-abdominal abscess. Sepsis. Small bowel perforation. EXAM: CT ABDOMEN AND PELVIS WITH CONTRAST TECHNIQUE: Multidetector CT imaging of the abdomen and pelvis was performed using the standard protocol following bolus administration of intravenous contrast. RADIATION DOSE REDUCTION: This exam was performed according to the departmental dose-optimization program which includes automated exposure control, adjustment of the mA and/or kV according to patient size and/or use of iterative reconstruction technique. CONTRAST:  117m OMNIPAQUE IOHEXOL 300 MG/ML  SOLN COMPARISON:  07/03/2022 FINDINGS: Lower chest:  LEFT pleural effusion and LEFT basilar atelectasis. Heart size is normal. Hepatobiliary: No focal liver abnormality is seen. No radiopaque gallstones, biliary dilatation, or pericholecystic inflammatory changes. Small amount of gas identified anterior to the liver, consistent with free intraperitoneal air from recent surgery. Pancreas: Unremarkable. No pancreatic ductal dilatation or surrounding inflammatory changes. Spleen: Normal in size without focal abnormality. Adrenals/Urinary Tract: Adrenal glands are unremarkable.  Kidneys are symmetric in size. No suspicious renal lesion. No hydronephrosis. Visualized courses of the ureters are unremarkable. The bladder and visualized portion of the urethra are normal. Stomach/Bowel: Stomach is normal in appearance. The small bowel loops are mildly dilated without evidence for obstruction. Bowel sutures are identified in the RIGHT mid abdomen. Air-fluid levels throughout the bowel loops. There has been improvement in small fluid collection in the RIGHT mid abdomen. No evidence for abscess. Vascular/Lymphatic: There is minimal atherosclerotic calcification of the abdominal aorta. Reproductive: Prostate is unremarkable. Other: Interval placement of LEFT sub diaphragmatic catheter. A small crescentic fluid collection persists, measuring approximately 7 centimeters in length and 1.3 centimeters in width (image 66 of series 5). Cachectic. Diffuse body wall edema. Musculoskeletal: No acute or significant osseous findings. IMPRESSION: 1. Interval placement of LEFT sub diaphragmatic catheter. There has been improvement in this fluid collection with small residual crescentic collection, measuring 7 centimeters in length and 1.3 centimeters in width. 2. Small locules of free intraperitoneal air, consistent with recent surgery. 3. Mildly dilated small bowel loops without evidence for obstruction. 4. Air-fluid levels throughout the bowel loops, consistent with ileus. 5. LEFT pleural effusion and LEFT basilar atelectasis. 6. Cachectic. Diffuse body wall edema. Electronically Signed   By: Nolon Nations M.D.   On: 07/10/2022 13:02     Assessment/Plan: Intra-abdominal infection = can discontinue iv abtx and change to 10 days more with fluconazole '400mg'$  po daily plus amox/clav '875mg'$  po BID.   Will sign off  We will see back in the ID clinic in 7-10 days.  [facility] Pager: [phone]  07/11/2022, 1:37 PM

## 2022-07-11 NOTE — Progress Notes (Addendum)
Patient ID: Gabriel Dalton, male   DOB: September 04, 1985, 37 y.o.   MRN: [id number] [facility] Surgery Progress Note  14 Days Post-Op  Subjective: Feels more pain relief with increase in oxy scale. Toelrating PO - reports drinking 3.5 ensure yesterday, eating 25% of breakfast, and 70% of a plate of spaghetti. Walked 4 times. Reports having bowel function. Wants to go home soon.   Objective: Vital signs in last 24 hours: Temp:  [97.8 F (36.6 C)-98.3 F (36.8 C)] 98.3 F (36.8 C) (03/01 0513) Pulse Rate:  [92-110] 92 (03/01 0513) Resp:  [14-16] 16 (03/01 0513) BP: (120-127)/(79-82) 126/82 (03/01 0513) SpO2:  [100 %] 100 % (03/01 0513) Last BM Date : 07/09/22  Intake/Output from previous day: 02/29 0701 - 03/01 0700 In: 2660.9 [P.O.:480; I.V.:1774.8; IV Piggyback:401.1] Out: 835 [Urine:800; Drains:35] Intake/Output this shift: No intake/output data recorded.  PE: Gen:  Alert, NAD, pleasant Pulm:  rate and effort normal on room air Abd: Soft, nondistended, clean dressings in place over midline wound - wound mostly pink some fibrinuous exudate -stable fascial dehiscence, drain purulent.  Lab Results:  Recent Labs    07/10/22 0233 07/11/22 0447  WBC 10.3 10.0  HGB 7.3* 7.5*  HCT 21.7* 22.5*  PLT 1,025* 982*   BMET Recent Labs    07/10/22 0233 07/11/22 0447  NA 129* 133*  K 4.0 5.0  CL 97* 101  CO2 25 25  GLUCOSE 96 295*  BUN 9 10  CREATININE 0.60* 0.55*  CALCIUM 8.4* 8.6*   PT/INR No results for input(s): "LABPROT", "INR" in the last 72 hours.  CMP     Component Value Date/Time   NA 133 (L) 07/11/2022 0447   K 5.0 07/11/2022 0447   CL 101 07/11/2022 0447   CO2 25 07/11/2022 0447   GLUCOSE 295 (H) 07/11/2022 0447   BUN 10 07/11/2022 0447   CREATININE 0.55 (L) 07/11/2022 0447   CALCIUM 8.6 (L) 07/11/2022 0447   PROT 6.6 07/10/2022 0233   ALBUMIN 2.4 (L) 07/10/2022 0233   AST 15 07/10/2022 0233   ALT 17 07/10/2022 0233   ALKPHOS 110  07/10/2022 0233   BILITOT 0.4 07/10/2022 0233   GFRNONAA >60 07/11/2022 0447   GFRAA >60 04/28/2019 1313   Lipase     Component Value Date/Time   LIPASE 30 06/24/2022 1400       Studies/Results: CT ABDOMEN PELVIS W CONTRAST  Result Date: 07/10/2022 CLINICAL DATA:  Intra-abdominal abscess. Sepsis. Small bowel perforation. EXAM: CT ABDOMEN AND PELVIS WITH CONTRAST TECHNIQUE: Multidetector CT imaging of the abdomen and pelvis was performed using the standard protocol following bolus administration of intravenous contrast. RADIATION DOSE REDUCTION: This exam was performed according to the departmental dose-optimization program which includes automated exposure control, adjustment of the mA and/or kV according to patient size and/or use of iterative reconstruction technique. CONTRAST:  151m OMNIPAQUE IOHEXOL 300 MG/ML  SOLN COMPARISON:  07/03/2022 FINDINGS: Lower chest: LEFT pleural effusion and LEFT basilar atelectasis. Heart size is normal. Hepatobiliary: No focal liver abnormality is seen. No radiopaque gallstones, biliary dilatation, or pericholecystic inflammatory changes. Small amount of gas identified anterior to the liver, consistent with free intraperitoneal air from recent surgery. Pancreas: Unremarkable. No pancreatic ductal dilatation or surrounding inflammatory changes. Spleen: Normal in size without focal abnormality. Adrenals/Urinary Tract: Adrenal glands are unremarkable. Kidneys are symmetric in size. No suspicious renal lesion. No hydronephrosis. Visualized courses of the ureters are unremarkable. The bladder and visualized portion of the urethra are normal. Stomach/Bowel: Stomach  is normal in appearance. The small bowel loops are mildly dilated without evidence for obstruction. Bowel sutures are identified in the RIGHT mid abdomen. Air-fluid levels throughout the bowel loops. There has been improvement in small fluid collection in the RIGHT mid abdomen. No evidence for abscess.  Vascular/Lymphatic: There is minimal atherosclerotic calcification of the abdominal aorta. Reproductive: Prostate is unremarkable. Other: Interval placement of LEFT sub diaphragmatic catheter. A small crescentic fluid collection persists, measuring approximately 7 centimeters in length and 1.3 centimeters in width (image 66 of series 5). Cachectic. Diffuse body wall edema. Musculoskeletal: No acute or significant osseous findings. IMPRESSION: 1. Interval placement of LEFT sub diaphragmatic catheter. There has been improvement in this fluid collection with small residual crescentic collection, measuring 7 centimeters in length and 1.3 centimeters in width. 2. Small locules of free intraperitoneal air, consistent with recent surgery. 3. Mildly dilated small bowel loops without evidence for obstruction. 4. Air-fluid levels throughout the bowel loops, consistent with ileus. 5. LEFT pleural effusion and LEFT basilar atelectasis. 6. Cachectic. Diffuse body wall edema. Electronically Signed   By: Nolon Nations M.D.   On: 07/10/2022 13:02    Anti-infectives: Anti-infectives (From admission, onward)    Start     Dose/Rate Route Frequency Ordered Stop   07/07/22 1215  fluconazole (DIFLUCAN) IVPB 400 mg        400 mg 100 mL/hr over 120 Minutes Intravenous Every 24 hours 07/07/22 1118     06/26/22 0000  piperacillin-tazobactam (ZOSYN) IVPB 3.375 g        3.375 g 12.5 mL/hr over 240 Minutes Intravenous Every 8 hours 06/25/22 1832     06/25/22 1815  piperacillin-tazobactam (ZOSYN) IVPB 3.375 g        3.375 g 100 mL/hr over 30 Minutes Intravenous  Once 06/25/22 1802 06/25/22 1830   06/25/22 1756  piperacillin-tazobactam (ZOSYN) 3.375 GM/50ML IVPB       Note to Pharmacy: Ward, Christa K: cabinet override      06/25/22 1756 06/27/22 0333   06/25/22 1630  ceFAZolin (ANCEF) IVPB 2g/100 mL premix        2 g 200 mL/hr over 30 Minutes Intravenous  Once 06/25/22 1534 06/25/22 1640        Assessment/Plan POD  16/14 s/p ex lap with SBR and washout for feculent peritonitis for SB perforation from SBO by Dr. Thermon Leyland  06/25/22; S/p ex lap abdominal closure 2/16 - Feculent peritonitis found in OR with 2 SB perforations noted from obstructive process.  Unclear etiology. Mother had something similar and wife states sister is having some GI issues, ? Need for further work up to rule out [employer] or other etiology - S/p IR perc drain 2/22, CT 2/29 w/ improvement in fluid collection and mild ileus . Cx's with Rare Candida Albicans  - discussed with ID, IV fluconazole and IV zosyn. WBC normalized.  - Continue soft diet and protein shakes. Stop TPN tonight - Continue bid wtd dressing changes to midline abdominal wound; add abdominal binder while OOB. - Pathology on small bowel benign with significant luminal narrowing at one point - Mobilize, pulm toilet - from a surgical standpoint I think this patient could go home tomorrow. Pain control improving on PO meds. He is tolerating PO and will continue to advance his diet at home. His drain is functioning properly and IR plans to see him in their clinic. He is mobilizing and has a safe dispo home with his wife. I will arrange surgical follow up and write  prescriptions for pain meds. Abx per ID   FEN - Soft diet, ensure VTE - SCDs, Lovenox ID - Zosyn 2/13 -->, fluconazole 2/26>> Foley - out 2/21, voiding well   Post op acute respiratory failure - resolved    LOS: 15 days    Jill Alexanders, [facility] Surgery 07/11/2022, 10:19 AM Please see Amion for pager number during day hours 7:00am-4:30pm

## 2022-07-11 NOTE — Progress Notes (Signed)
PHARMACY - TOTAL PARENTERAL NUTRITION CONSULT NOTE   Indication: malnutrition, bowel perforation, expected prolonged ileus  Patient Measurements: Height: '5\' 8"'$  (172.7 cm) Weight: 51.4 kg (113 lb 4.8 oz) IBW/kg (Calculated) : 68.4 TPN AdjBW (KG): 49.8 Body mass index is 17.23 kg/m.  Assessment: 71 yoM admitted with abdominal pain, weight loss. Underwent laparoscopic cholecystectomy on 2/14 and was found with multiple bowel perforations and stool contamination of abdomen, converted to exploratory laparotomy, with small bowel resection and reanastomosis, and temporary abdominal closure. TPN started on 06/25/22.    Glucose / Insulin: No noted hx DM. A1c 5.8. Goal BG <150. BG high, 295, on AM BMET.   Electrolytes: Na low, but improved (TPN with max Na). K and Phos on upper end of goal range. Other lytes, including Corrected Calcium, WNL.  Renal: SCr low. BUN WNL.  Hepatic: LFTs/Tbili WNL. Albumin remains low/stable. TG stable/WNL.  I/O: eating 25-50% of meals, drinking Ensures. UOP not fully quantified. Having BMs (LBM 3/1).  - drain OP 35 mL  - MIVF: NS @ 60 mL/hr  GI Imaging: - 2/20 Abd CT: suggestive of ileus in the setting of peritonitis; suspicious for perisplenic abscess - 2/29 Abd CT:  There has been improvement in fluid collection with small residual crescentic collection. Small locules of free intraperitoneal air, consistent with recent surgery. Mildly dilated small bowel loops without evidence for obstruction. Air-fluid levels throughout the bowel loops, consistent with ileus. GI Surgeries / Procedures:  2/14 OR: small bowel resection and reanastomosis, open abdomen 2/16 OR: exp lap, washout and abdominal closure  2/22 IR drain placed in perisplenic abscess  Central access: Double lumen PICC placed 2/15 TPN start date: 2/15  Nutritional Goals: Goal TPN rate is 80 mL/hr to provide 96 g of protein and 2039 kcals per day. Goal for ileus: Mag >/= 2, K >/= 4  RD  Assessment: Estimated Needs Total Energy Estimated Needs: 1950-2100 kcals Total Protein Estimated Needs: 90-100 grams Total Fluid Estimated Needs: >/= 1.9L  Current Nutrition:  Soft diet TPN  Ensure Plus TID  Plan:  Per Surgery, can discontinue TPN tonight. Decrease TPN to 20 mL/hr now, then stop at 6pm. Discussed with RN.  mIVF per MD.  Discontinued TPN labs and TPN-associated nursing orders.  Pharmacy will sign off at this time.    Lindell Spar, PharmD, BCPS Clinical Pharmacist 07/11/2022, 7:19 AM

## 2022-07-11 NOTE — Progress Notes (Signed)
Referring Physician(s): Byerly,F  Supervising Physician: Ruthann Cancer  Patient Status:  [facility] - In-pt  Chief Complaint: abdominal/back pain, left upper abdominal fluid collection      Subjective: Pt doing ok today; no new c/o; asking about going home   Allergies: Aspirin  Medications: Prior to Admission medications   Medication Sig Start Date End Date Taking? Authorizing Provider  acetaminophen (TYLENOL) 500 MG tablet Take 1,000 mg by mouth as needed for moderate pain.   Yes [provider]  ciprofloxacin (CIPRO) 500 MG tablet Take 500 mg by mouth 2 (two) times daily. 06/20/22  Yes [provider]  dicyclomine (BENTYL) 20 MG tablet Take 1 tablet (20 mg total) by mouth 2 (two) times daily. Patient taking differently: Take 20 mg by mouth daily as needed for spasms. 05/28/22  Yes Zehr, Laban Emperor, PA-C  linaclotide (LINZESS) 145 MCG CAPS capsule Take 1 capsule (145 mcg total) by mouth daily before breakfast. Patient taking differently: Take 145 mcg by mouth as needed (constipation). 04/23/22  Yes Zehr, Laban Emperor, PA-C  pantoprazole (PROTONIX) 40 MG tablet Take 1 tablet (40 mg total) by mouth 2 (two) times daily. 04/23/22  Yes Zehr, Laban Emperor, PA-C  sucralfate (CARAFATE) 1 g tablet Take 1 tablet (1 g total) by mouth 4 (four) times daily -  with meals and at bedtime. Patient taking differently: Take 1 g by mouth as needed (heartburn). 05/28/22  Yes Zehr, Laban Emperor, PA-C  tamsulosin (FLOMAX) 0.4 MG CAPS capsule Take 0.4 mg by mouth daily. 06/20/22  Yes [provider]  ketorolac (TORADOL) 10 MG tablet Take 10 mg by mouth every 6 (six) hours as needed. Patient not taking: Reported on 06/25/2022 06/20/22   [provider]  oxyCODONE-acetaminophen (PERCOCET/ROXICET) 5-325 MG tablet Take 1 tablet by mouth every 6 (six) hours as needed for severe pain. Patient not taking: Reported on 06/25/2022 05/18/22   Rex Kras, PA  promethazine (PHENERGAN) 25 MG tablet Take 1  tablet (25 mg total) by mouth every 6 (six) hours as needed for nausea or vomiting. Patient not taking: Reported on 06/25/2022 05/18/22   Rex Kras, PA     Vital Signs: BP 126/82 (BP Location: Left Arm)   Pulse 92   Temp 98.3 F (36.8 C) (Oral)   Resp 16   Ht '5\' 8"'$  (1.727 m)   Wt 113 lb 4.8 oz (51.4 kg)   SpO2 100%   BMI 17.23 kg/m   Physical Exam awake, alert.  Left upper abdominal drain intact, insertion site mildly tender, output 35 cc turbid beige colored fluid; drain flushed without difficulty    Imaging: CT ABDOMEN PELVIS W CONTRAST  Result Date: 07/10/2022 CLINICAL DATA:  Intra-abdominal abscess. Sepsis. Small bowel perforation. EXAM: CT ABDOMEN AND PELVIS WITH CONTRAST TECHNIQUE: Multidetector CT imaging of the abdomen and pelvis was performed using the standard protocol following bolus administration of intravenous contrast. RADIATION DOSE REDUCTION: This exam was performed according to the departmental dose-optimization program which includes automated exposure control, adjustment of the mA and/or kV according to patient size and/or use of iterative reconstruction technique. CONTRAST:  1100m OMNIPAQUE IOHEXOL 300 MG/ML  SOLN COMPARISON:  07/03/2022 FINDINGS: Lower chest: LEFT pleural effusion and LEFT basilar atelectasis. Heart size is normal. Hepatobiliary: No focal liver abnormality is seen. No radiopaque gallstones, biliary dilatation, or pericholecystic inflammatory changes. Small amount of gas identified anterior to the liver, consistent with free intraperitoneal air from recent surgery. Pancreas: Unremarkable. No pancreatic ductal dilatation or surrounding inflammatory changes.  Spleen: Normal in size without focal abnormality. Adrenals/Urinary Tract: Adrenal glands are unremarkable. Kidneys are symmetric in size. No suspicious renal lesion. No hydronephrosis. Visualized courses of the ureters are unremarkable. The bladder and visualized portion of the urethra are normal.  Stomach/Bowel: Stomach is normal in appearance. The small bowel loops are mildly dilated without evidence for obstruction. Bowel sutures are identified in the RIGHT mid abdomen. Air-fluid levels throughout the bowel loops. There has been improvement in small fluid collection in the RIGHT mid abdomen. No evidence for abscess. Vascular/Lymphatic: There is minimal atherosclerotic calcification of the abdominal aorta. Reproductive: Prostate is unremarkable. Other: Interval placement of LEFT sub diaphragmatic catheter. A small crescentic fluid collection persists, measuring approximately 7 centimeters in length and 1.3 centimeters in width (image 66 of series 5). Cachectic. Diffuse body wall edema. Musculoskeletal: No acute or significant osseous findings. IMPRESSION: 1. Interval placement of LEFT sub diaphragmatic catheter. There has been improvement in this fluid collection with small residual crescentic collection, measuring 7 centimeters in length and 1.3 centimeters in width. 2. Small locules of free intraperitoneal air, consistent with recent surgery. 3. Mildly dilated small bowel loops without evidence for obstruction. 4. Air-fluid levels throughout the bowel loops, consistent with ileus. 5. LEFT pleural effusion and LEFT basilar atelectasis. 6. Cachectic. Diffuse body wall edema. Electronically Signed   By: Nolon Nations M.D.   On: 07/10/2022 13:02    Labs:  CBC: Recent Labs    07/08/22 0447 07/09/22 0113 07/10/22 0233 07/11/22 0447  WBC 17.4* 12.5* 10.3 10.0  HGB 7.4* 7.6* 7.3* 7.5*  HCT 21.9* 22.6* 21.7* 22.5*  PLT 1,048*  1,037* 1,146* 1,025* 982*    COAGS: Recent Labs    07/03/22 0334 07/08/22 0447  INR 1.1 1.2  APTT  --  34    BMP: Recent Labs    07/08/22 0447 07/09/22 0113 07/10/22 0233 07/11/22 0447  NA 127* 134* 129* 133*  K 3.9 4.0 4.0 5.0  CL 96* 98 97* 101  CO2 '25 27 25 25  '$ GLUCOSE 106* 100* 96 295*  BUN '13 11 9 10  '$ CALCIUM 8.3* 8.7* 8.4* 8.6*  CREATININE  0.51* 0.53* 0.60* 0.55*  GFRNONAA >60 >60 >60 >60    LIVER FUNCTION TESTS: Recent Labs    07/06/22 0301 07/08/22 0447 07/09/22 0113 07/10/22 0233  BILITOT 0.8 0.5 0.6 0.4  AST '15 15 17 15  '$ ALT '17 19 20 17  '$ ALKPHOS 133* 153* 147* 110  PROT 6.1* 7.0 7.3 6.6  ALBUMIN 2.2* 2.4* 2.7* 2.4*    Assessment and Plan: Pt  s/p ex lap with SBR and washout for feculent peritonitis for SB perforation from SBO by Dr. Thermon Leyland  06/25/22 ; s/p LUQ perisplenic fluid collection drain 07/03/22; afebrile , WBC nl, hgb 7.5(7.3), creat 0.55; f/u CT A/P yesterday:  1. Interval placement of LEFT sub diaphragmatic catheter. There has been improvement in this fluid collection with small residual crescentic collection, measuring 7 centimeters in length and 1.3 centimeters in width. 2. Small locules of free intraperitoneal air, consistent with recent surgery. 3. Mildly dilated small bowel loops without evidence for obstruction. 4. Air-fluid levels throughout the bowel loops, consistent with ileus. 5. LEFT pleural effusion and LEFT basilar atelectasis. 6. Cachectic. Diffuse body wall edema  Plan: Continue TID flushes with 5 cc NS. Record output Q shift. Dressing changes QD or PRN if soiled.  Call IR APP or on call IR MD if difficulty flushing or sudden change in drain output.  Repeat imaging/possible drain  injection once output < 10 mL/QD (excluding flush material). Consideration for drain removal if output is < 10 mL/QD (excluding flush material), pending discussion with the providing surgical service.   Discharge planning: Please contact IR APP or on call IR MD prior to patient d/c to ensure appropriate follow up plans are in place. Typically patient will follow up with IR clinic 10-14 days post d/c for repeat imaging/possible drain injection. IR scheduler will contact patient with date/time of appointment. Patient will need to flush drain QD with 5 cc NS, record output QD, dressing changes every 2-3  days or earlier if soiled.    IR will continue to follow - please call with questions or concerns  Electronically Signed: D. Rowe Robert, PA-C 07/11/2022, 9:32 AM   I spent a total of 15 Minutes at the the patient's bedside AND on the patient's hospital floor or unit, greater than 50% of which was counseling/coordinating care for  left upper abdominal fluid collection drain      Patient ID: Gabriel Dalton, male   DOB: December 29, 1985, 37 y.o.   MRN: [id number]

## 2022-07-11 NOTE — Progress Notes (Signed)
PROGRESS NOTE    Gabriel Dalton  [id number] DOB: 12-08-1985 DOA: 06/24/2022 PCP: Marrian Salvage, FNP    Chief Complaint  Patient presents with   Abdominal Pain    Brief Narrative:  37 yo BM PMHx chronic abd pain, wt loss, bronchitis, multiple gastric ulcers, tobacco abuse, gallstones (for which he saw [facility] outpt 2/8 and chole was offered but not yet scheduled) presented to [facility] ED 2/13 with CC abd pain. followed by gastroenterology labouer as outpatient and has had extensive workup in past 3 years for similar presentation-he has had extensive workup including multiple CT scans, MRI, ultrasound, NM gastric emptying study, most recent endoscopy evaluation in August 2023 revealing esophagitis with no bleeding, gastritis and biopsy with mild nonspecific reactive gastropathy, he has had positive occult blood 05/31/2022 which was attributed to hemorrhoids.  He endorses about 60 pound weight loss since the symptoms started.  He was recently seen by general surgery on 06/19/2022 to discuss cholecystectomy given that he has a gallstone. Patient was taken to the OR on 06/25/2022-had numerous small bowel perforations and evacuation of 1 L of bowel contents.   Assessment & Plan:   Principal Problem:   Cholelithiasis Active Problems:   Pain of upper abdomen   Severe protein-calorie malnutrition (HCC)   Small bowel perforation (HCC)   Endotracheal tube present   Gallstones   Peritonitis (HCC)   Endotracheally intubated   Electrolyte abnormality   Benign prostatic hyperplasia  #1 sepsis secondary to peritonitis from small bowel perforation/intra-abdominal abscesses -Patient is status post explopratory laparotomy with small bowel resection and washout for feculent peritonitis for small bowel perforation from small bowel obstruction per Dr.Stechschulte 06/25/2022: Status post exploratory laparotomy abdominal closure 216/2024 -Patient noted with a significant leukocytosis which is  starting to trend down. -CT abdomen and pelvis 07/01/2022 suggestive of ileus in the setting of peritonitis. -Patient status post IR percutaneous drain placement 07/03/2022 with cultures growing rare Candida albicans. -Diet being advanced per general surgery to soft diet and protein shakes. -Was on TPN which was weaned down and subsequently discontinued by general surgery.  -Continue twice daily WTD dressing changes to midline abdominal wound as recommended by general surgery. -Repeat CT abdomen and pelvis with interval placement of left subdiaphragmatic catheter, there has been improvement in this fluid collection with small residual concentric collection measuring 7 cm in length and 1.3 cm in width, small locules of free intraperitoneal air consistent with recent surgery.  Mildly dilated small bowel loops without evidence of obstruction.  Air-fluid levels throughout the bowel loops consistent with ileus.  Left pleural effusion and left basilar atelectasis.  Cachexia.  Diffuse body wall edema. -IR following up on abscesses.  -Continue IV Zosyn, IV fluconazole as recommended per ID. -ID also recommending transitioning to fluconazole 400 mg daily and Augmentin 875 mg twice daily for 10 more days on discharge with outpatient follow-up in ID clinic in 7 to 10 days. -General surgery and ID following and appreciate input and recommendations.  2.  BPH -Foley can be difficult to place, urology consulted Foley catheter placed per urology. -Foley catheter discontinued and patient noted to have good urinary output.  3.  Electrolyte abnormalities including hyponatremia/hypokalemia/hypomagnesemia/hypophosphatemia -Currently on TPN which has been weaned down and subsequently discontinued per general surgery. -Repeat labs in a.m. to follow-up on electrolytes.  4.  Thrombocytosis -Likely secondary to acute infection. -Dr. Zigmund , discussed with hematology and felt it was likely reactive. -Patient with prior  history of gastric ulcers and as such  unable to tolerate aspirin at this time. -Thrombocytosis slowly trending down. -Outpatient follow-up.  5.  Severe protein calorie malnutrition -Diet advanced to soft diet and patient started on protein shakes per general surgery. -Patient currently eating about 25%-75% of his meals. -Patient noted to have eating 75% of his spaghetti dinner last night and ate about 50% of his breakfast this morning. -TPN has been discontinued per general surgery.  6.  Normocytic anemia -Patient with no overt bleeding. -Hemoglobin currently stable at 7.5. -Anemia panel with iron level of 17, TIBC 157, ferritin of 602, folate of 5.3. -Unable to receive IV iron due to acute infection. -Continue folic acid 1 mg daily. -Transfusion threshold hemoglobin < 7.  7.  Hyponatremia -Likely secondary to hypervolemic hyponatremia in the setting of hypoalbuminemia. -Patient on TPN which has been discontinued per general surgery.. -Follow for now.  8. Pressure injury Pressure Injury 07/06/22 Coccyx Mid Stage 2 -  Partial thickness loss of dermis presenting as a shallow open injury with a red, pink wound bed without slough. (Active)  07/06/22 1307  Location: Coccyx  Location Orientation: Mid  Staging: Stage 2 -  Partial thickness loss of dermis presenting as a shallow open injury with a red, pink wound bed without slough.  Wound Description (Comments):   Present on Admission: No       DVT prophylaxis: Lovenox Code Status: Full Family Communication: Updated patient.  No family at bedside. Disposition: Likely home when clinically improved, cleared by general surgery may be in the next 1 to 2 days.  Status is: Inpatient Remains inpatient appropriate because: Severity of illness   Consultants:  PCCM: Dr. Loanne Drilling 06/25/2022 General surgery: Dr. Donne Hazel 06/24/2022 Urology: Dr. Alinda Money 06/25/2022 Interventional radiology: Dr. Anselm Pancoast 07/03/2022 Infectious disease: Dr. Baxter Flattery  07/07/2022  Procedures:  CT head 06/24/2022 CT abdomen and pelvis 07/01/2022 Chest x-ray 06/25/2022 Abdominal films 06/28/2022 MRI L-spine 06/24/2022 Right upper quadrant ultrasound 06/24/2022 CT drain placement left upper quadrant abscess per IR, Dr. Annamaria Boots 07/03/2022 Diagnostic laparoscopy, converted to expiratory laparotomy with small bowel resection and reanastomosis and temporary abdominal closure.  Dr.Stechschulte General surgery 06/25/2022. Expiratory laparotomy, washout, abdominal closure per general surgery: Dr.Stechschulte 06/27/2022 16 French coud catheter placement per urology: Dr. Alinda Money 06/25/2022  Significant Hospital Events: Including procedures, antibiotic start and stop dates in addition to other pertinent events   2/13 admit. [facility] consult for chole eval given known hx gallstones  2/14 intended lap chole --> ex lap with identification of small bowel perfs req resection + feculent peritonitis. To ICU post op intubated. Added zosyn  2/15 remains intubated sedated w open abd  2/16 OR for washout and abdominal closure, adding D5 with hypoglycemia on low rate TPN  2/17 extubated   Antimicrobials:  Anti-infectives (From admission, onward)    Start     Dose/Rate Route Frequency Ordered Stop   07/07/22 1215  fluconazole (DIFLUCAN) IVPB 400 mg  Status:  Discontinued        400 mg 100 mL/hr over 120 Minutes Intravenous Every 24 hours 07/07/22 1118 07/11/22 1336   06/26/22 0000  piperacillin-tazobactam (ZOSYN) IVPB 3.375 g        3.375 g 12.5 mL/hr over 240 Minutes Intravenous Every 8 hours 06/25/22 1832     06/25/22 1815  piperacillin-tazobactam (ZOSYN) IVPB 3.375 g        3.375 g 100 mL/hr over 30 Minutes Intravenous  Once 06/25/22 1802 06/25/22 1830   06/25/22 1756  piperacillin-tazobactam (ZOSYN) 3.375 GM/50ML IVPB  Note to Pharmacy: Ward, Christa K: cabinet override      06/25/22 1756 06/27/22 0333   06/25/22 1630  ceFAZolin (ANCEF) IVPB 2g/100 mL premix        2 g 200  mL/hr over 30 Minutes Intravenous  Once 06/25/22 1534 06/25/22 1640         Subjective: Sitting up in bed.  Stated he ate 25% of his breakfast yesterday and 75% of his spaghetti for dinner.  Stated he ate about 50% of his breakfast this morning.  States was told by general surgeon who could potentially go home tomorrow.  Denies any chest pain.  Still with diffuse abdominal pain.    Objective: Vitals:   07/10/22 1357 07/10/22 2133 07/11/22 0513 07/11/22 1226  BP: 127/79 120/81 126/82 132/89  Pulse: (!) 110 (!) 104 92 93  Resp: '14 16 16 16  '$ Temp: 97.8 F (36.6 C) 98 F (36.7 C) 98.3 F (36.8 C) 98.5 F (36.9 C)  TempSrc: Oral Oral Oral Oral  SpO2: 100% 100% 100% 100%  Weight:      Height:        Intake/Output Summary (Last 24 hours) at 07/11/2022 1607 Last data filed at 07/11/2022 1400 Gross per 24 hour  Intake 3197.99 ml  Output 540 ml  Net 2657.99 ml   Filed Weights   06/26/22 1045 07/03/22 1014 07/09/22 1500  Weight: 48.3 kg 48 kg 51.4 kg    Examination:  General exam: NAD. Respiratory system: Lungs clear to auscultation bilaterally.  No wheezes, no crackles, no rhonchi.  Fair air movement.  Speaking in full sentences.    Cardiovascular system: RRR no murmurs rubs or gallops.  No JVD.  No lower extremity edema.   Gastrointestinal system: Abdomen is soft, some diffuse tenderness to palpation, nondistended, postop bandage on anterior abdomen.  JP left upper quadrant with some whitish purulent drainage.  Positive bowel sounds.  No rebound.  No guarding.  Central nervous system: Alert and oriented. No focal neurological deficits. Extremities: Symmetric 5 x 5 power. Skin: No rashes, lesions or ulcers Psychiatry: Judgement and insight appear normal. Mood & affect appropriate.     Data Reviewed: I have personally reviewed following labs and imaging studies  CBC: Recent Labs  Lab 07/07/22 0305 07/08/22 0447 07/09/22 0113 07/10/22 0233 07/11/22 0447  WBC 18.8* 17.4*  12.5* 10.3 10.0  NEUTROABS 15.1* 13.6* 8.7* 6.7 7.0  HGB 7.8* 7.4* 7.6* 7.3* 7.5*  HCT 23.0* 21.9* 22.6* 21.7* 22.5*  MCV 84.9 85.2 85.3 85.4 88.2  PLT 1,070* 1,048*  1,037* 1,146* 1,025* 982*    Basic Metabolic Panel: Recent Labs  Lab 07/06/22 0301 07/07/22 0305 07/08/22 0447 07/09/22 0113 07/10/22 0233 07/11/22 0447  NA 132*  --  127* 134* 129* 133*  K 4.1  --  3.9 4.0 4.0 5.0  CL 100  --  96* 98 97* 101  CO2 24  --  '25 27 25 25  '$ GLUCOSE 107*  --  106* 100* 96 295*  BUN 12  --  '13 11 9 10  '$ CREATININE 0.48*  --  0.51* 0.53* 0.60* 0.55*  CALCIUM 8.4*  --  8.3* 8.7* 8.4* 8.6*  MG 2.2 1.6* 1.9 2.3 1.9 2.1  PHOS 3.2 3.0 3.4 3.5 4.0 4.5    GFR: Estimated Creatinine Clearance: 93.7 mL/min (A) (by C-G formula based on SCr of 0.55 mg/dL (L)).  Liver Function Tests: Recent Labs  Lab 07/05/22 0335 07/06/22 0301 07/08/22 0447 07/09/22 0113 07/10/22 0233  AST 16  $'15 15 17 15  'J$ ALT '18 17 19 20 17  '$ ALKPHOS 135* 133* 153* 147* 110  BILITOT 0.7 0.8 0.5 0.6 0.4  PROT 6.3* 6.1* 7.0 7.3 6.6  ALBUMIN 2.3* 2.2* 2.4* 2.7* 2.4*    CBG: No results for input(s): "GLUCAP" in the last 168 hours.   Recent Results (from the past 240 hour(s))  Aerobic/Anaerobic Culture w Gram Stain (surgical/deep wound)     Status: None   Collection Time: 07/03/22  3:34 PM   Specimen: Abscess  Result Value Ref Range Status   Specimen Description   Final    ABSCESS Performed at [facility] [address] [place], [place] [address]    Special Requests   Final    Normal Performed at [facility], [place] [address] [place], [place] [address]    Gram Stain   Final    RARE WBC PRESENT,BOTH PMN AND MONONUCLEAR RARE BUDDING YEAST SEEN    Culture   Final    RARE CANDIDA ALBICANS NO ANAEROBES ISOLATED Performed at [facility], [address] [place], [place] [address]    Report Status 07/08/2022 FINAL  Final         Radiology Studies: CT ABDOMEN  PELVIS W CONTRAST  Result Date: 07/10/2022 CLINICAL DATA:  Intra-abdominal abscess. Sepsis. Small bowel perforation. EXAM: CT ABDOMEN AND PELVIS WITH CONTRAST TECHNIQUE: Multidetector CT imaging of the abdomen and pelvis was performed using the standard protocol following bolus administration of intravenous contrast. RADIATION DOSE REDUCTION: This exam was performed according to the departmental dose-optimization program which includes automated exposure control, adjustment of the mA and/or kV according to patient size and/or use of iterative reconstruction technique. CONTRAST:  144m OMNIPAQUE IOHEXOL 300 MG/ML  SOLN COMPARISON:  07/03/2022 FINDINGS: Lower chest: LEFT pleural effusion and LEFT basilar atelectasis. Heart size is normal. Hepatobiliary: No focal liver abnormality is seen. No radiopaque gallstones, biliary dilatation, or pericholecystic inflammatory changes. Small amount of gas identified anterior to the liver, consistent with free intraperitoneal air from recent surgery. Pancreas: Unremarkable. No pancreatic ductal dilatation or surrounding inflammatory changes. Spleen: Normal in size without focal abnormality. Adrenals/Urinary Tract: Adrenal glands are unremarkable. Kidneys are symmetric in size. No suspicious renal lesion. No hydronephrosis. Visualized courses of the ureters are unremarkable. The bladder and visualized portion of the urethra are normal. Stomach/Bowel: Stomach is normal in appearance. The small bowel loops are mildly dilated without evidence for obstruction. Bowel sutures are identified in the RIGHT mid abdomen. Air-fluid levels throughout the bowel loops. There has been improvement in small fluid collection in the RIGHT mid abdomen. No evidence for abscess. Vascular/Lymphatic: There is minimal atherosclerotic calcification of the abdominal aorta. Reproductive: Prostate is unremarkable. Other: Interval placement of LEFT sub diaphragmatic catheter. A small crescentic fluid  collection persists, measuring approximately 7 centimeters in length and 1.3 centimeters in width (image 66 of series 5). Cachectic. Diffuse body wall edema. Musculoskeletal: No acute or significant osseous findings. IMPRESSION: 1. Interval placement of LEFT sub diaphragmatic catheter. There has been improvement in this fluid collection with small residual crescentic collection, measuring 7 centimeters in length and 1.3 centimeters in width. 2. Small locules of free intraperitoneal air, consistent with recent surgery. 3. Mildly dilated small bowel loops without evidence for obstruction. 4. Air-fluid levels throughout the bowel loops, consistent with ileus. 5. LEFT pleural effusion and LEFT basilar atelectasis. 6. Cachectic. Diffuse body wall edema. Electronically Signed   By: ENolon NationsM.D.   On: 07/10/2022 13:02  Scheduled Meds:  acetaminophen  1,000 mg Oral Q6H   Chlorhexidine Gluconate Cloth  6 each Topical Q0600   enoxaparin (LOVENOX) injection  40 mg Subcutaneous Daily   feeding supplement  237 mL Oral TID BM   fentaNYL  1 patch Transdermal Q72H   fluticasone  1 spray Each Nare Daily   folic acid  1 mg Oral Daily   methocarbamol  1,000 mg Oral QID   pantoprazole  40 mg Oral Daily   simethicone  80 mg Oral QID   sodium chloride flush  10-40 mL Intracatheter Q12H   sodium chloride flush  5 mL Intracatheter Q8H   Continuous Infusions:  sodium chloride Stopped (06/27/22 0832)   sodium chloride 60 mL/hr at 07/10/22 2237   piperacillin-tazobactam 3.375 g (07/11/22 0900)   TPN ADULT (ION) 20 mL/hr at 07/11/22 1041     LOS: 15 days    Time spent: 35 minutes    Irine Seal, MD [facility]   To contact the attending provider between 7A-7P or the covering provider during after hours 7P-7A, please log into the web site [email or web address] and access using universal [place] password for that web site. If you do not have the password, please call the hospital  operator.  07/11/2022, 4:07 PM

## 2022-07-11 NOTE — Discharge Instructions (Addendum)
MIDLINE WOUND CARE: - midline dressing to be changed twice daily - supplies: saline, kerlix/gauze, scissors, ABD pads, tape  - remove dressing and all packing carefully, moistening with sterile saline as needed to avoid packing/internal dressing sticking to the wound. - clean edges of skin around the wound with water/gauze, making sure there is no tape debris or leakage left on skin that could cause skin irritation or breakdown. - dampen a clean kerlix/gauze with saline and pack wound from wound base to skin level, making sure to take note of any possible areas of wound tracking, tunneling and packing appropriately. Wound can be packed loosely. Trim kerlix to size if a whole kerlix is not required. - cover wound with a dry ABD pad and secure with tape.  - apply any skin protectant/powder recommended by clinician to protect skin/skin folds. - change dressing as needed if leakage occurs, wound gets contaminated, or patient requests to shower. - patient may shower daily with wound open and following the shower the wound should be dried and a clean dressing placed.    DRAIN CARE FLUSH LEFT ABDOMINAL DRAIN WITH 5 CC STERILE SALINE ONCE DAILY RECORD DRAIN OUTPUT AND CHANGE DRESSING EVERY 2-3 DAYS CALL [phone] - Interventional Radiology - WITH ANY DRAIN RELATED QUESTIONS   SURGERY: POST OP INSTRUCTIONS (Surgery for small bowel obstruction, colon resection, etc)   ######################################################################  EAT Gradually transition to a high fiber diet with a fiber supplement over the next few days after discharge  WALK Walk an hour a day.  Control your pain to do that.    CONTROL PAIN Control pain so that you can walk, sleep, tolerate sneezing/coughing, go up/down stairs.  HAVE A BOWEL MOVEMENT DAILY Keep your bowels regular to avoid problems.  OK to try a laxative to override constipation.  OK to use an antidairrheal to slow down diarrhea.  Call if not  better after 2 tries  CALL IF YOU HAVE PROBLEMS/CONCERNS Call if you are still struggling despite following these instructions. Call if you have concerns not answered by these instructions  ######################################################################   DIET Follow a light diet the first few days at home.  Start with a bland diet such as soups, liquids, starchy foods, low fat foods, etc.  If you feel full, bloated, or constipated, stay on a ful liquid or pureed/blenderized diet for a few days until you feel better and no longer constipated. Be sure to drink plenty of fluids every day to avoid getting dehydrated (feeling dizzy, not urinating, etc.). Gradually add a fiber supplement to your diet over the next week.  Gradually get back to a regular solid diet.  Avoid fast food or heavy meals the first week as you are more likely to get nauseated. It is expected for your digestive tract to need a few months to get back to normal.  It is common for your bowel movements and stools to be irregular.  You will have occasional bloating and cramping that should eventually fade away.  Until you are eating solid food normally, off all pain medications, and back to regular activities; your bowels will not be normal. Focus on eating a low-fat, high fiber diet the rest of your life (See Getting to [place], below).  CARE of your INCISION or WOUND  It is good for closed incisions and even open wounds to be washed every day.  Shower every day.  Short baths are fine.  Wash the incisions and wounds clean with soap & water.  SEE WOUND CARE INSTRUCTIONS ABOVE    ACTIVITIES as tolerated Start light daily activities --- self-care, walking, climbing stairs-- beginning the day after surgery.  Gradually increase activities as tolerated.  Control your pain to be active.  Stop when you are tired.  Ideally, walk several times a day, eventually an hour a day.   Most people are back to most day-to-day  activities in a few weeks.  It takes 4-8 weeks to get back to unrestricted, intense activity. If you can walk 30 minutes without difficulty, it is safe to try more intense activity such as jogging, treadmill, bicycling, low-impact aerobics, swimming, etc. Save the most intensive and strenuous activity for last (Usually 4-8 weeks after surgery) such as sit-ups, heavy lifting, contact sports, etc.  Refrain from any intense heavy lifting or straining until you are off narcotics for pain control.  You will have off days, but things should improve week-by-week. DO NOT PUSH THROUGH PAIN.  Let pain be your guide: If it hurts to do something, don't do it.  Pain is your body warning you to avoid that activity for another week until the pain goes down. You may drive when you are no longer taking narcotic prescription pain medication, you can comfortably wear a seatbelt, and you can safely make sudden turns/stops to protect yourself without hesitating due to pain. You may have sexual intercourse when it is comfortable. If it hurts to do something, stop.  MEDICATIONS Take your usually prescribed home medications unless otherwise directed.   Blood thinners:  Usually you can restart any strong blood thinners after the second postoperative day.  It is OK to take aspirin right away.     If you are on strong blood thinners (warfarin/Coumadin, Plavix, Xerelto, Eliquis, Pradaxa, etc), discuss with your surgeon, medicine PCP, and/or cardiologist for instructions on when to restart the blood thinner & if blood monitoring is needed (PT/INR blood check, etc).     PAIN CONTROL Pain after surgery or related to activity is often due to strain/injury to muscle, tendon, nerves and/or incisions.  This pain is usually short-term and will improve in a few months.  To help speed the process of healing and to get back to regular activity more quickly, DO THE FOLLOWING THINGS TOGETHER: Increase activity gradually.  DO NOT PUSH  THROUGH PAIN Use Ice and/or Heat Try Gentle Massage and/or Stretching Take over the counter pain medication Take Narcotic prescription pain medication for more severe pain  Good pain control = faster recovery.  It is better to take more medicine to be more active than to stay in bed all day to avoid medications.  Increase activity gradually Avoid heavy lifting at first, then increase to lifting as tolerated over the next 6 weeks. Do not "push through" the pain.  Listen to your body and avoid positions and maneuvers than reproduce the pain.  Wait a few days before trying something more intense Walking an hour a day is encouraged to help your body recover faster and more safely.  Start slowly and stop when getting sore.  If you can walk 30 minutes without stopping or pain, you can try more intense activity (running, jogging, aerobics, cycling, swimming, treadmill, sex, sports, weightlifting, etc.) Remember: If it hurts to do it, then don't do it! Use Ice and/or Heat You will have swelling and bruising around the incisions.  This will take several weeks to resolve. Ice packs or heating pads (6-8 times a day, 30-60 minutes at a time) will help  sooth soreness & bruising. Some people prefer to use ice alone, heat alone, or alternate between ice & heat.  Experiment and see what works best for you.  Consider trying ice for the first few days to help decrease swelling and bruising; then, switch to heat to help relax sore spots and speed recovery. Shower every day.  Short baths are fine.  It feels good!  Keep the incisions and wounds clean with soap & water.   Try Gentle Massage and/or Stretching Massage at the area of pain many times a day Stop if you feel pain - do not overdo it Take over the counter pain medication This helps the muscle and nerve tissues become less irritable and calm down faster Choose ONE of the following over-the-counter anti-inflammatory medications: Acetaminophen '500mg'$  tabs  (Tylenol) 1-2 pills with every meal and just before bedtime (avoid if you have liver problems or if you have acetaminophen in you narcotic prescription) Naproxen '220mg'$  tabs (ex. Aleve, Naprosyn) 1-2 pills twice a day (avoid if you have kidney, stomach, IBD, or bleeding problems) Ibuprofen '200mg'$  tabs (ex. Advil, Motrin) 3-4 pills with every meal and just before bedtime (avoid if you have kidney, stomach, IBD, or bleeding problems) Take with food/snack several times a day as directed for at least 2 weeks to help keep pain / soreness down & more manageable. Take Narcotic prescription pain medication for more severe pain A prescription for strong pain control is often given to you upon discharge (for example: oxycodone/Percocet, hydrocodone/Norco/Vicodin, or tramadol/Ultram) Take your pain medication as prescribed. Be mindful that most narcotic prescriptions contain Tylenol (acetaminophen) as well - avoid taking too much Tylenol. If you are having problems/concerns with the prescription medicine (does not control pain, nausea, vomiting, rash, itching, etc.), please call us [phone] to see if we need to switch you to a different pain medicine that will work better for you and/or control your side effects better. If you need a refill on your pain medication, you must call the office before 4 pm and on weekdays only.  By federal law, prescriptions for narcotics cannot be called into a pharmacy.  They must be filled out on paper & picked up from our office by the patient or authorized caretaker.  Prescriptions cannot be filled after 4 pm nor on weekends.    WHEN TO CALL us [phone] Severe uncontrolled or worsening pain  Fever over 101 F (38.5 C) Concerns with the incision: Worsening pain, redness, rash/hives, swelling, bleeding, or drainage Reactions / problems with new medications (itching, rash, hives, nausea, etc.) Nausea and/or vomiting Difficulty urinating Difficulty breathing Worsening  fatigue, dizziness, lightheadedness, blurred vision Other concerns If you are not getting better after two weeks or are noticing you are getting worse, contact our office [phone] for further advice.  We may need to adjust your medications, re-evaluate you in the office, send you to the emergency room, or see what other things we can do to help. The clinic staff is available to answer your questions during regular business hours (8:30am-5pm).  Please don't hesitate to call and ask to speak to one of our nurses for clinical concerns.    A surgeon from [facility] is always on call at the hospitals 24 hours/day If you have a medical emergency, go to the nearest emergency room or call 911.  FOLLOW UP in our office One the day of your discharge from the hospital (or the next business weekday), please call [facility] to  set up or confirm an appointment to see your surgeon in the office for a follow-up appointment.  Usually it is 2-3 weeks after your surgery.   If you have skin staples at your incision(s), let the office know so we can set up a time in the office for the nurse to remove them (usually around 10 days after surgery). Make sure that you call for appointments the day of discharge (or the next business weekday) from the hospital to ensure a convenient appointment time. IF YOU HAVE DISABILITY OR FAMILY LEAVE FORMS, BRING THEM TO THE OFFICE FOR PROCESSING.  DO NOT GIVE THEM TO YOUR DOCTOR.  [facility] [address], [place], [place], [place]  [address] ? [phone] - Main [phone] - [place],  [phone] - Fax [email or web address]    GETTING TO GOOD BOWEL HEALTH. It is expected for your digestive tract to need a few months to get back to normal.  It is common for your bowel movements and stools to be irregular.  You will have occasional bloating and cramping that should eventually fade away.  Until you are  eating solid food normally, off all pain medications, and back to regular activities; your bowels will not be normal.   Avoiding constipation The goal: ONE SOFT BOWEL MOVEMENT A DAY!    Drink plenty of fluids.  Choose water first. TAKE A FIBER SUPPLEMENT EVERY DAY THE REST OF YOUR LIFE During your first week back home, gradually add back a fiber supplement every day Experiment which form you can tolerate.   There are many forms such as powders, tablets, wafers, gummies, etc Psyllium bran (Metamucil), methylcellulose (Citrucel), Miralax or Glycolax, Benefiber, Flax Seed.  Adjust the dose week-by-week (1/2 dose/day to 6 doses a day) until you are moving your bowels 1-2 times a day.  Cut back the dose or try a different fiber product if it is giving you problems such as diarrhea or bloating. Sometimes a laxative is needed to help jump-start bowels if constipated until the fiber supplement can help regulate your bowels.  If you are tolerating eating & you are farting, it is okay to try a gentle laxative such as double dose MiraLax, prune juice, or Milk of Magnesia.  Avoid using laxatives too often. Stool softeners can sometimes help counteract the constipating effects of narcotic pain medicines.  It can also cause diarrhea, so avoid using for too long. If you are still constipated despite taking fiber daily, eating solids, and a few doses of laxatives, call our office. Controlling diarrhea Try drinking liquids and eating bland foods for a few days to avoid stressing your intestines further. Avoid dairy products (especially milk & ice cream) for a short time.  The intestines often can lose the ability to digest lactose when stressed. Avoid foods that cause gassiness or bloating.  Typical foods include beans and other legumes, cabbage, broccoli, and dairy foods.  Avoid greasy, spicy, fast foods.  Every person has some sensitivity to other foods, so listen to your body and avoid those foods that trigger  problems for you. Probiotics (such as active yogurt, Align, etc) may help repopulate the intestines and colon with normal bacteria and calm down a sensitive digestive tract Adding a fiber supplement gradually can help thicken stools by absorbing excess fluid and retrain the intestines to act more normally.  Slowly increase the dose over a few weeks.  Too much fiber too soon can backfire and cause cramping & bloating. It is  okay to try and slow down diarrhea with a few doses of antidiarrheal medicines.   Bismuth subsalicylate (ex. Kayopectate, Pepto Bismol) for a few doses can help control diarrhea.  Avoid if pregnant.   Loperamide (Imodium) can slow down diarrhea.  Start with one tablet ('2mg'$ ) first.  Avoid if you are having fevers or severe pain.  ILEOSTOMY PATIENTS WILL HAVE CHRONIC DIARRHEA since their colon is not in use.    Drink plenty of liquids.  You will need to drink even more glasses of water/liquid a day to avoid getting dehydrated. Record output from your ileostomy.  Expect to empty the bag every 3-4 hours at first.  Most people with a permanent ileostomy empty their bag 4-6 times at the least.   Use antidiarrheal medicine (especially Imodium) several times a day to avoid getting dehydrated.  Start with a dose at bedtime & breakfast.  Adjust up or down as needed.  Increase antidiarrheal medications as directed to avoid emptying the bag more than 8 times a day (every 3 hours). Work with your wound ostomy nurse to learn care for your ostomy.  See ostomy care instructions. TROUBLESHOOTING IRREGULAR BOWELS 1) Start with a soft & bland diet. No spicy, greasy, or fried foods.  2) Avoid gluten/wheat or dairy products from diet to see if symptoms improve. 3) Miralax 17gm or flax seed mixed in [place]. water or juice-daily. May use 2-4 times a day as needed. 4) Gas-X, Phazyme, etc. as needed for gas & bloating.  5) Prilosec (omeprazole) over-the-counter as needed 6)  Consider probiotics (Align,  Activa, etc) to help calm the bowels down  Call your doctor if you are getting worse or not getting better.  Sometimes further testing (cultures, endoscopy, X-ray studies, CT scans, bloodwork, etc.) may be needed to help diagnose and treat the cause of the diarrhea. [facility], [place], [place], [place], [place]  [address] [phone] - Main.    [phone]  - Toll Free.   [phone] - Fax [email or web address]

## 2022-07-11 NOTE — TOC Progression Note (Signed)
Transition of Care [facility]) - Progression Note   Patient Details  Name: Gabriel Dalton MRN: [id number] Date of Birth: 21-May-1985  Transition of [facility]) CM/SW [place], LCSW Phone Number: 07/11/2022, 10:53 AM  Clinical Narrative: [place] orders have been placed. CSW updated Levada Dy with [employer].    Expected Discharge Plan: [place] Barriers to Discharge: Continued Medical Work up  Expected Discharge Plan and Services In-house Referral: Clinical Social Work Post Acute Care Choice: Home Health            DME Arranged: N/A DME Agency: NA HH Arranged: PT [place] Agency: [place] ([place] now called [facility] Date HH Agency Contacted: 07/08/22 Representative spoke with at [place]: Levada Dy  Social Determinants of Health (SDOH) Interventions SDOH Screenings   Depression (PHQ2-9): High Risk (04/17/2022)  Tobacco Use: High Risk (07/03/2022)   Readmission Risk Interventions     No data to display

## 2022-07-12 ENCOUNTER — Other Ambulatory Visit (HOSPITAL_COMMUNITY): Payer: Self-pay

## 2022-07-12 DIAGNOSIS — R188 Other ascites: Secondary | ICD-10-CM | POA: Diagnosis not present

## 2022-07-12 DIAGNOSIS — N400 Enlarged prostate without lower urinary tract symptoms: Secondary | ICD-10-CM | POA: Diagnosis not present

## 2022-07-12 DIAGNOSIS — R1010 Upper abdominal pain, unspecified: Secondary | ICD-10-CM | POA: Diagnosis not present

## 2022-07-12 DIAGNOSIS — K8020 Calculus of gallbladder without cholecystitis without obstruction: Secondary | ICD-10-CM | POA: Diagnosis not present

## 2022-07-12 LAB — CBC WITH DIFFERENTIAL/PLATELET
Abs Immature Granulocytes: 0.05 10*3/uL (ref 0.00–0.07)
Basophils Absolute: 0 10*3/uL (ref 0.0–0.1)
Basophils Relative: 0 %
Eosinophils Absolute: 0.3 10*3/uL (ref 0.0–0.5)
Eosinophils Relative: 3 %
HCT: 22.2 % — ABNORMAL LOW (ref 39.0–52.0)
Hemoglobin: 7.3 g/dL — ABNORMAL LOW (ref 13.0–17.0)
Immature Granulocytes: 1 %
Lymphocytes Relative: 23 %
Lymphs Abs: 1.9 10*3/uL (ref 0.7–4.0)
MCH: 28.6 pg (ref 26.0–34.0)
MCHC: 32.9 g/dL (ref 30.0–36.0)
MCV: 87.1 fL (ref 80.0–100.0)
Monocytes Absolute: 0.9 10*3/uL (ref 0.1–1.0)
Monocytes Relative: 11 %
Neutro Abs: 5.2 10*3/uL (ref 1.7–7.7)
Neutrophils Relative %: 62 %
Platelets: 914 10*3/uL (ref 150–400)
RBC: 2.55 MIL/uL — ABNORMAL LOW (ref 4.22–5.81)
RDW: 17.5 % — ABNORMAL HIGH (ref 11.5–15.5)
WBC: 8.5 10*3/uL (ref 4.0–10.5)
nRBC: 0 % (ref 0.0–0.2)

## 2022-07-12 LAB — COMPREHENSIVE METABOLIC PANEL
ALT: 19 U/L (ref 0–44)
AST: 16 U/L (ref 15–41)
Albumin: 3.1 g/dL — ABNORMAL LOW (ref 3.5–5.0)
Alkaline Phosphatase: 95 U/L (ref 38–126)
Anion gap: 10 (ref 5–15)
BUN: 10 mg/dL (ref 6–20)
CO2: 25 mmol/L (ref 22–32)
Calcium: 8.8 mg/dL — ABNORMAL LOW (ref 8.9–10.3)
Chloride: 98 mmol/L (ref 98–111)
Creatinine, Ser: 0.57 mg/dL — ABNORMAL LOW (ref 0.61–1.24)
GFR, Estimated: >60 mL/min (ref >60)
Glucose, Bld: 83 mg/dL (ref 70–99)
Potassium: 3.7 mmol/L (ref 3.5–5.1)
Sodium: 133 mmol/L — ABNORMAL LOW (ref 135–145)
Total Bilirubin: 0.4 mg/dL (ref 0.3–1.2)
Total Protein: 7.2 g/dL (ref 6.5–8.1)

## 2022-07-12 LAB — PHOSPHORUS: Phosphorus: 4.1 mg/dL (ref 2.5–4.6)

## 2022-07-12 LAB — MAGNESIUM: Magnesium: 1.7 mg/dL (ref 1.7–2.4)

## 2022-07-12 MED ORDER — FLUCONAZOLE 200 MG PO TABS
400.0000 mg | ORAL_TABLET | Freq: Every day | ORAL | Status: DC
  Administered 2022-07-12: 400 mg via ORAL
  Filled 2022-07-12: qty 2

## 2022-07-12 MED ORDER — MAGNESIUM OXIDE -MG SUPPLEMENT 400 (240 MG) MG PO TABS: 400.0000 mg | ORAL_TABLET | Freq: Two times a day (BID) | ORAL | Status: DC

## 2022-07-12 MED ORDER — MAGNESIUM SULFATE 2 GM/50ML IV SOLN: 2.0000 g | Freq: Once | INTRAVENOUS | Status: DC

## 2022-07-12 MED ORDER — MAGNESIUM OXIDE -MG SUPPLEMENT 400 (240 MG) MG PO TABS
400.0000 mg | ORAL_TABLET | Freq: Two times a day (BID) | ORAL | 0 refills | Status: AC
  Filled 2022-07-12: qty 8, 4d supply, fill #0

## 2022-07-12 MED ORDER — METHOCARBAMOL 500 MG PO TABS
500.0000 mg | ORAL_TABLET | Freq: Three times a day (TID) | ORAL | 1 refills | Status: DC | PRN
  Filled 2022-07-12: qty 30, 10d supply, fill #0
  Filled 2022-07-17 – 2022-07-21 (×3): qty 30, 10d supply, fill #1

## 2022-07-12 NOTE — Discharge Summary (Signed)
Physician Discharge Summary  Gabriel Dalton [id number] DOB: 1985/07/16 DOA: 06/24/2022  PCP: Marrian Salvage, FNP  Admit date: 06/24/2022 Discharge date: 07/12/2022  Time spent: 60 minutes  Recommendations for Outpatient Follow-up:  Follow-up with Dr.Stechschulte, general surgery on 07/29/2022. Follow-up with Dr.Snider, infectious disease in 7 to 10 days. Follow-up with Marrian Salvage, FNP in 2 to 3 weeks.  On follow-up patient will need a comprehensive metabolic profile, magnesium level, phosphorus level done to follow-up on electrolytes and renal function.  Patient will need a CBC done to follow-up on counts. Follow-up with IR and drain clinic. Follow-up with Dr. Silverio Decamp, gastroenterology as previously scheduled on 07/21/2022.   Discharge Diagnoses:  Principal Problem:   Cholelithiasis Active Problems:   Pain of upper abdomen   Severe protein-calorie malnutrition (HCC)   Small bowel perforation (HCC)   Endotracheal tube present   Gallstones   Peritonitis (HCC)   Endotracheally intubated   Electrolyte abnormality   Benign prostatic hyperplasia   Abdominal fluid collection   Discharge Condition: Stable and improved.  Diet recommendation: Heart healthy  Filed Weights   06/26/22 1045 07/03/22 1014 07/09/22 1500  Weight: 48.3 kg 48 kg 51.4 kg    History of present illness:  HPI per Dr. Lupita Leash  36yom with of  chronic abdomen pain and weight loss presented to the ED for further evaluation.  He was being followed by gastroenterology lumbar as outpatient and has had extensive workup in past 3 years for similar presentation-he has had extensive workup including multiple CT scans, MRI, ultrasound, NM gastric emptying study, most recent endoscopy evaluation in August 2023 revealing esophagitis with no bleeding, gastritis and biopsy with mild nonspecific reactive gastropathy, he has had positive occult blood 05/31/2022 which was attributed to hemorrhoids.  He  endorses about 60 pound weight loss since the symptoms started.  He was recently seen by general surgery on 06/19/2022 to discuss cholecystectomy given that he has a gallstone.   In the ED, vitals stable labs with hyponatremia, normal LFTs lipase CBC with hemoglobin at 11.7 g was 14 on 06/03/22. General surgery was consulted in the ED requesting admission to [facility] for possible cholecystectomy tomorrow. Ordered patient also complained of bilateral lower extremity numbness and somewhat decreased sensation more on the right leg than the left, he reports he may have slightly weaker right leg than the left, this has not going on for few months, has been using cane to get around.  In the ED CT brain and MRI lumbar spine ordered result pending    Hospital Course:  #1 sepsis secondary to peritonitis from small bowel perforation/intra-abdominal abscesses -Patient is status post explopratory laparotomy with small bowel resection and washout for feculent peritonitis for small bowel perforation from small bowel obstruction per Dr.Stechschulte 06/25/2022: Status post exploratory laparotomy abdominal closure 216/2024 -Patient noted with a significant leukocytosis which improved and trending down during the hospitalization and had normalized by day of discharge.   -CT abdomen and pelvis 07/01/2022 suggestive of ileus in the setting of peritonitis. -Patient status post IR percutaneous drain placement 07/03/2022 with cultures growing rare Candida albicans. -Diet advanced per general surgery to soft diet and protein shakes. -Was on TPN which was weaned down and subsequently discontinued by general surgery.  -Patient maintained on twice daily WTD dressing changes to midline abdominal wound as recommended by general surgery. -Repeat CT abdomen and pelvis with interval placement of left subdiaphragmatic catheter, there has been improvement in this fluid collection with small residual concentric collection  measuring 7  cm in length and 1.3 cm in width, small locules of free intraperitoneal air consistent with recent surgery.  Mildly dilated small bowel loops without evidence of obstruction.  Air-fluid levels throughout the bowel loops consistent with ileus.  Left pleural effusion and left basilar atelectasis.  Cachexia.  Diffuse body wall edema. -IR following up on abscesses.  -Patient maintained on IV Zosyn and IV fluconazole as recommended by ID during the hospitalization.  -ID also recommending transitioning to fluconazole 400 mg daily and Augmentin 875 mg twice daily for 10 more days on discharge with outpatient follow-up in ID clinic in 7 to 10 days. -Patient will follow-up with general surgery on 07/29/2022 as scheduled, follow-up with ID, Dr. Baxter Flattery in 7 to 10 days. -Patient will also follow-up with interventional radiology.   2.  BPH -Foley catheter was noted to be difficult to place, urology consulted Foley catheter placed per urology. -Foley catheter discontinued and patient noted to have good urinary output and passed voiding trial..   3.  Electrolyte abnormalities including hyponatremia/hypokalemia/hypomagnesemia/hypophosphatemia -Patient noted to be on TPN early on during the hospitalization and as such electrolyte abnormalities were managed by pharmacy.   -TPN subsequently weaned off and electrolyte abnormalities corrected.   -Will need repeat labs in the outpatient setting with PCP.     4.  Thrombocytosis -Likely secondary to acute infection. -Dr. Zigmund , discussed with hematology and felt it was likely reactive. -Patient with prior history of gastric ulcers and as such unable to tolerate aspirin at this time. -Thrombocytosis slowly trending down during the hospitalization with treatment of patient's acute infection.   -Outpatient follow-up with PCP.    5.  Severe protein calorie malnutrition -Diet advanced to soft diet and patient started on protein shakes per general surgery. -Patient  initially had poor oral intake during the hospitalization and had to be placed on TPN.   -As patient's oral input improved TPN was subsequently weaned down and discontinued per general surgery.   -Patient encouraged to continue good oral intake.   -Outpatient follow-up with PCP.    6.  Normocytic anemia -Patient with no overt bleeding. -Hemoglobin stabilized at 7.3 by day of discharge.  -Anemia panel with iron level of 17, TIBC 157, ferritin of 602, folate of 5.3. -Unable to receive IV iron due to acute infection. -Patient maintained on folic acid 1 mg daily. -Outpatient follow-up.   7.  Hyponatremia -Likely secondary to hypervolemic hyponatremia in the setting of hypoalbuminemia. -Patient was on TPN which has been discontinued per general surgery.. -Hyponatremia improved during hospitalization. -Outpatient follow-up.   8. Pressure injury Pressure Injury 07/06/22 Coccyx Mid Stage 2 -  Partial thickness loss of dermis presenting as a shallow open injury with a red, pink wound bed without slough. (Active)  07/06/22 1307  Location: Coccyx  Location Orientation: Mid  Staging: Stage 2 -  Partial thickness loss of dermis presenting as a shallow open injury with a red, pink wound bed without slough.  Wound Description (Comments):   Present on Admission: No         Procedures: CT head 06/24/2022 CT abdomen and pelvis 07/01/2022 Chest x-ray 06/25/2022 Abdominal films 06/28/2022 MRI L-spine 06/24/2022 Right upper quadrant ultrasound 06/24/2022 CT drain placement left upper quadrant abscess per IR, Dr. Annamaria Boots 07/03/2022 Diagnostic laparoscopy, converted to expiratory laparotomy with small bowel resection and reanastomosis and temporary abdominal closure.  Dr.Stechschulte General surgery 06/25/2022. Expiratory laparotomy, washout, abdominal closure per general surgery: Dr.Stechschulte 06/27/2022 16 French coud catheter placement per  urology: Dr. Alinda Money 06/25/2022   Significant Hospital  Events: Including procedures, antibiotic start and stop dates in addition to other pertinent events   2/13 admit. [facility] consult for chole eval given known hx gallstones  2/14 intended lap chole --> ex lap with identification of small bowel perfs req resection + feculent peritonitis. To ICU post op intubated. Added zosyn  2/15 remains intubated sedated w open abd  2/16 OR for washout and abdominal closure, adding D5 with hypoglycemia on low rate TPN  2/17 extubated    Consultations: PCCM: Dr. Loanne Drilling 06/25/2022 General surgery: Dr. Donne Hazel 06/24/2022 Urology: Dr. Alinda Money 06/25/2022 Interventional radiology: Dr. Anselm Pancoast 07/03/2022 Infectious disease: Dr. Baxter Flattery 07/07/2022  Discharge Exam: Vitals:   07/11/22 2122 07/12/22 0534  BP: 137/85 121/81  Pulse: 94 86  Resp: 16 16  Temp: 98.6 F (37 C) 98.4 F (36.9 C)  SpO2: 100% 100%    General: NAD Cardiovascular: Regular rate rhythm no murmurs rubs or gallops.  No JVD.  No lower extremity edema. Respiratory: Lungs clear to auscultation bilaterally.  No wheezes, no crackles, no rhonchi.  Fair air movement.  Speaking in full sentences.  Discharge Instructions   Discharge Instructions     Diet - low sodium heart healthy   Complete by: As directed    Discharge wound care:   Complete by: As directed    Pressure Injury 07/06/22 Coccyx Mid Stage 2 -  Partial thickness loss of dermis presenting as a shallow open injury with a red, pink wound bed without slough. 5 days      Wound Care Orders (From admission, onward)      Start     Ordered   06/27/22 1757    Wound care  Every shift      Comments: Cleanse with NS, pat gently dry. Fill defects with saline moistened roll gauze, top with dry gauze, ABD pads and secure with tape. Change PRN for soiling, otherwise twice daily.   Increase activity slowly   Complete by: As directed       Allergies as of 07/12/2022       Reactions   Aspirin Other (See Comments)   Cannot take due to stomach  ulcers        Medication List     STOP taking these medications    ciprofloxacin 500 MG tablet Commonly known as: CIPRO   ketorolac 10 MG tablet Commonly known as: TORADOL   oxyCODONE-acetaminophen 5-325 MG tablet Commonly known as: PERCOCET/ROXICET   promethazine 25 MG tablet Commonly known as: PHENERGAN       TAKE these medications    acetaminophen 500 MG tablet Commonly known as: TYLENOL Take 2 tablets (1,000 mg total) by mouth every 6 (six) hours. What changed:  when to take this reasons to take this   amoxicillin-clavulanate 875-125 MG tablet Commonly known as: AUGMENTIN Take 1 tablet by mouth 2 (two) times daily for 10 days.   dicyclomine 20 MG tablet Commonly known as: BENTYL Take 1 tablet (20 mg total) by mouth 2 (two) times daily. What changed:  when to take this reasons to take this   feeding supplement Liqd Take 237 mLs by mouth 3 (three) times daily between meals.   fluconazole 200 MG tablet Commonly known as: DIFLUCAN Take 2 tablets (400 mg total) by mouth daily for 10 days. Start taking on: July 13, 2022   fluticasone 50 MCG/ACT nasal spray Commonly known as: FLONASE Place 1 spray into both nostrils daily.   folic acid 1  MG tablet Commonly known as: FOLVITE Take 1 tablet (1 mg total) by mouth daily.   linaclotide 145 MCG Caps capsule Commonly known as: Linzess Take 1 capsule (145 mcg total) by mouth daily before breakfast. What changed:  when to take this reasons to take this   magnesium oxide 400 (240 Mg) MG tablet Commonly known as: MAG-OX Take 1 tablet (400 mg total) by mouth 2 (two) times daily for 4 days.   methocarbamol 500 MG tablet Commonly known as: ROBAXIN Take 1 tablet (500 mg total) by mouth every 8 (eight) hours as needed for muscle spasms.   Normal Saline Flush 0.9 % Soln Flush with 5 mLs by Intracatheter route daily.   ondansetron 4 MG tablet Commonly known as: Zofran Take 1 tablet (4 mg total) by mouth  every 8 (eight) hours as needed for nausea or vomiting.   Oxycodone HCl 10 MG Tabs Take 1-1.5 tablets (10-15 mg total) by mouth every 6 (six) hours as needed for moderate pain or severe pain.   pantoprazole 40 MG tablet Commonly known as: PROTONIX Take 1 tablet (40 mg total) by mouth daily. What changed: when to take this   simethicone 80 MG chewable tablet Commonly known as: MYLICON Chew 1 tablet (80 mg total) by mouth 4 (four) times daily as needed for flatulence.   sucralfate 1 g tablet Commonly known as: Carafate Take 1 tablet (1 g total) by mouth 4 (four) times daily -  with meals and at bedtime. What changed:  when to take this reasons to take this   tamsulosin 0.4 MG Caps capsule Commonly known as: FLOMAX Take 0.4 mg by mouth daily.               Discharge Care Instructions  (From admission, onward)           Start     Ordered   07/12/22 0000  Discharge wound care:       Comments: Pressure Injury 07/06/22 Coccyx Mid Stage 2 -  Partial thickness loss of dermis presenting as a shallow open injury with a red, pink wound bed without slough. 5 days      Wound Care Orders (From admission, onward)      Start     Ordered   06/27/22 1757    Wound care  Every shift      Comments: Cleanse with NS, pat gently dry. Fill defects with saline moistened roll gauze, top with dry gauze, ABD pads and secure with tape. Change PRN for soiling, otherwise twice daily.   07/12/22 1241           Allergies  Allergen Reactions   Aspirin Other (See Comments)    Cannot take due to stomach ulcers    Follow-up Information     [facility] Follow up.   Why: [facility] will provide PT in the home after discharge.        Stechschulte, Nickola Major, MD. Go on 07/29/2022.   Specialty: Surgery Why: at 3:30 PM for post-operative follow up. please arrive by 3:00 PM to get checked in and fill out any necessary paperwork. Contact information: [address] [place] [address] [phone]         Carlyle Basques, MD. Schedule an appointment as soon as possible for a visit in 10 day(s).   Specialty: Infectious Diseases Why: To follow up after your hospital stay to make sure infection has resolved Contact information: [address]  [place] [address] [phone]         [place], [place], [place]. Schedule an appointment as soon as possible for a visit in 2 day(s).   Specialty: Internal Medicine Why: f/u in 2-3 weeks Contact information: [place] [address] [phone]         Mauri Pole, MD Follow up on 07/21/2022.   Specialty: Gastroenterology Why: Follow-up as previously scheduled. Contact information: [place] [address] [phone]                  The results of significant diagnostics from this hospitalization (including imaging, microbiology, ancillary and laboratory) are listed below for reference.    Significant Diagnostic Studies: CT ABDOMEN PELVIS W CONTRAST  Result Date: 07/10/2022 CLINICAL DATA:  Intra-abdominal abscess. Sepsis. Small bowel perforation. EXAM: CT ABDOMEN AND PELVIS WITH CONTRAST TECHNIQUE: Multidetector CT imaging of the abdomen and pelvis was performed using the standard protocol following bolus administration of intravenous contrast. RADIATION DOSE REDUCTION: This exam was performed according to the departmental dose-optimization program which includes automated exposure control, adjustment of the mA and/or kV according to patient size and/or use of iterative reconstruction technique. CONTRAST:  125m OMNIPAQUE IOHEXOL 300 MG/ML  SOLN COMPARISON:  07/03/2022 FINDINGS: Lower chest: LEFT pleural effusion and LEFT basilar atelectasis. Heart size is normal. Hepatobiliary: No focal liver abnormality is seen. No radiopaque gallstones, biliary dilatation, or pericholecystic inflammatory changes. Small amount of gas  identified anterior to the liver, consistent with free intraperitoneal air from recent surgery. Pancreas: Unremarkable. No pancreatic ductal dilatation or surrounding inflammatory changes. Spleen: Normal in size without focal abnormality. Adrenals/Urinary Tract: Adrenal glands are unremarkable. Kidneys are symmetric in size. No suspicious renal lesion. No hydronephrosis. Visualized courses of the ureters are unremarkable. The bladder and visualized portion of the urethra are normal. Stomach/Bowel: Stomach is normal in appearance. The small bowel loops are mildly dilated without evidence for obstruction. Bowel sutures are identified in the RIGHT mid abdomen. Air-fluid levels throughout the bowel loops. There has been improvement in small fluid collection in the RIGHT mid abdomen. No evidence for abscess. Vascular/Lymphatic: There is minimal atherosclerotic calcification of the abdominal aorta. Reproductive: Prostate is unremarkable. Other: Interval placement of LEFT sub diaphragmatic catheter. A small crescentic fluid collection persists, measuring approximately 7 centimeters in length and 1.3 centimeters in width (image 66 of series 5). Cachectic. Diffuse body wall edema. Musculoskeletal: No acute or significant osseous findings. IMPRESSION: 1. Interval placement of LEFT sub diaphragmatic catheter. There has been improvement in this fluid collection with small residual crescentic collection, measuring 7 centimeters in length and 1.3 centimeters in width. 2. Small locules of free intraperitoneal air, consistent with recent surgery. 3. Mildly dilated small bowel loops without evidence for obstruction. 4. Air-fluid levels throughout the bowel loops, consistent with ileus. 5. LEFT pleural effusion and LEFT basilar atelectasis. 6. Cachectic. Diffuse body wall edema. Electronically Signed   By: ENolon NationsM.D.   On: 07/10/2022 13:02   CT GUIDED PERITONEAL/RETROPERITONEAL FLUID DRAIN BY PERC CATH  Result Date:  07/03/2022 INDICATION: Left upper quadrant perisplenic/subdiaphragmatic abscess EXAM: CT DRAINAGE LEFT UPPER QUADRANT ABSCESS Date:  07/03/2022 07/03/2022 4:03 pm Radiologist:  M. TDaryll Brod MD Guidance:  CT FLUOROSCOPY: None. MEDICATIONS: 1% lidocaine local ANESTHESIA/SEDATION: Moderate (conscious) sedation was employed during this procedure. A total of Versed 4.0 mg and Fentanyl 100 mcg was administered intravenously. Moderate Sedation Time: 28 minutes. The patient's level of consciousness and vital signs were monitored continuously  by radiology nursing throughout the procedure under my direct supervision. CONTRAST:  None. COMPLICATIONS: None immediate. PROCEDURE: Informed consent was obtained from the patient following explanation of the procedure, risks, benefits and alternatives. The patient understands, agrees and consents for the procedure. All questions were addressed. A time out was performed. Maximal barrier sterile technique utilized including caps, mask, sterile gowns, sterile gloves, large sterile drape, hand hygiene, and ChloraPrep. Previous imaging reviewed. Patient position left side up decubitus. Noncontrast localization CT performed. The left upper quadrant fluid collection about the spleen and beneath the diaphragm was localized and marked for a posterior intercostal approach. Under sterile conditions and local anesthesia, an 18 gauge introducer needle was advanced. Needle position confirmed with CT. Guidewire inserted followed by tract dilatation to insert a 10 [place] drain. Unfortunately,, the drain catheter passed beneath the spleen and medial to the fluid collection. This catheter was cut and removed. At a more anterior intercostal space, an 18 gauge introducer needle was advanced into a large component of the fluid collection. Needle position confirmed with CT. Needle was directed cephalad to the subdiaphragmatic area. Guidewire inserted easily followed by tract dilatation insert a 10 French  drain. Drain catheter position confirmed within the subdiaphragmatic abscess by CT. Syringe aspiration yielded 70 cc purulent fluid. Catheter secured with Prolene suture and a sterile dressing. Suction bulb connected. Sample sent for culture. Patient tolerated the procedure well. IMPRESSION: Successful CT-guided left upper quadrant abscess drain placement as above. Electronically Signed   By: Jerilynn Mages.  Shick M.D.   On: 07/03/2022 16:42   CT ABDOMEN PELVIS W CONTRAST  Result Date: 07/01/2022 CLINICAL DATA:  Acute abdominal pain, postop for small-bowel resection with small bowel perforation and feculent peritonitis. EXAM: CT ABDOMEN AND PELVIS WITH CONTRAST TECHNIQUE: Multidetector CT imaging of the abdomen and pelvis was performed using the standard protocol following bolus administration of intravenous contrast. RADIATION DOSE REDUCTION: This exam was performed according to the departmental dose-optimization program which includes automated exposure control, adjustment of the mA and/or kV according to patient size and/or use of iterative reconstruction technique. CONTRAST:  160m OMNIPAQUE IOHEXOL 300 MG/ML  SOLN COMPARISON:  May 20, 2022 FINDINGS: Lower chest: Moderate LEFT and small RIGHT pleural effusions. Hepatobiliary: Suggestion of background steatosis of the liver. Portal vein is patent. No pericholecystic stranding or sign of biliary duct dilation. Perihepatic fluid with some peritoneal enhancement w and small locules of gas/pneumoperitoneum. Fluid tracks along the anterior abdomen adjacent to small bowel loops, for instance on image 53/2 an area measuring 6 x 1.1 cm is noted. Loculated fluid in the LEFT subdiaphragmatic region, see below. Pancreas: Pancreas is normal. Spleen: Spleen with compression of parenchyma due to loculated LEFT subdiaphragmatic fluid that tracks along the gastrosplenic ligament and beneath the LEFT hemidiaphragm. This fluid measures 8.5 x 4.3 cm (image 21/2) Adrenals/Urinary Tract:  Adrenal glands are normal. Symmetric renal enhancement without hydronephrosis or perinephric stranding. Foley catheter decompresses the urinary bladder which is not well assessed. No suspicious renal lesion. Stomach/Bowel: Distortion of the stomach due to loculated LEFT subdiaphragmatic fluid adjacent to spleen and stomach. Contrast passes beyond the stomach into the small bowel becomes more dilute is it passes distally. Signs of small bowel resection in the RIGHT lower quadrant. Small bowel is dilated to this level. Colon becomes gradually decompressed along the descending colon and is moderately dilated as well and filled with gas. Small bowel dilation is intermittent. Contrast passes the level of the anastomotic site in the RIGHT lower quadrant becoming more dilute  is it passes into the ileum. No discrete transition point is identified. There is a slight changing caliber just beyond the anastomosis but bowel loops show mild distension to the level of the terminal ileum. No definitive signs of leak.  No gas near the anastomotic site. No focal fluid near the anastomotic site. Vascular/Lymphatic: No aneurysmal dilation of the abdominal aorta. Calcified aortic atherosclerotic changes. No adenopathy in the abdomen or in the pelvis. Reproductive: Scrotal in generalized edema throughout the body wall likely reflecting anasarca or hypoproteinemia. Otherwise unremarkable to the extent evaluated by CT. Other: Loculated appearing fluid about the liver and in particular in the LEFT subdiaphragmatic space. Small locules of gas within this fluid in this recent postoperative patient. Peritoneal enhancement. Trace stranding and thickening along the midline surgical changes as well. Signs of fluid in the pelvis, best seen on image 55/4 measuring approximately 4.8 x 1.0 cm in the coronal plane adjacent to small bowel loops in the pelvis Musculoskeletal: Body wall edema. Signs of cachexia. No acute bone finding or destructive bone  process. IMPRESSION: 1. Signs of small bowel resection in the RIGHT lower quadrant. No definitive signs of leak at this time such is gas or extraluminal contrast adjacent to the small bowel anastomosis. 2. Findings more suggestive of ileus in the setting of peritonitis. Partial small bowel obstruction in the RIGHT lower quadrant is another differential consideration, follow-up will be helpful. 3. Loculated LEFT subdiaphragmatic fluid suspicious for abscess deforming the spleen. 4. Small volume pneumoperitoneum with small locules of gas within perihepatic fluid also suspicious for complicated postoperative fluid though without as well-defined margins but associated with peritoneal enhancement. 5. Small fluid in the pelvis adjacent to small bowel loops barely discernible in the axial plane seen best in the coronal plane in the LEFT hemipelvis. 6. Signs of anasarca with LEFT greater than RIGHT pleural effusions and body wall edema. Aortic Atherosclerosis (ICD10-I70.0). Electronically Signed   By: Zetta Bills M.D.   On: 07/01/2022 16:25   DG Abd 1 View  Result Date: 06/28/2022 CLINICAL DATA:  NG tube placement. EXAM: ABDOMEN - 1 VIEW COMPARISON:  CT, 05/20/2022. FINDINGS: Nasogastric tube passes well below the diaphragm, tip in the mid stomach. Dilated small bowel loops are noted in the visualized central abdomen. There is opacity in the medial left upper lobe, that appears to of progressed when compared to the chest radiograph dated 06/25/2022. This may reflect atelectasis. IMPRESSION: 1. Well-positioned nasal/orogastric tube. 2. Dilated small bowel loops consistent with a partial small bowel obstruction. This could be further assessed with CT. 3. Increased opacity in the medial left upper lobe suggestive of atelectasis. Electronically Signed   By: Lajean Manes M.D.   On: 06/28/2022 09:36   [place] EKG SITE RITE  Result Date: 06/26/2022 If Site Rite image not attached, placement could not be confirmed due to  current cardiac rhythm.  Portable Chest x-ray  Result Date: 06/25/2022 CLINICAL DATA:  Intubated EXAM: PORTABLE CHEST 1 VIEW COMPARISON:  05/31/2022 FINDINGS: Endotracheal tube tip is about 5.6 cm superior to the carina. Esophageal tube tip below the diaphragm. Clear lung fields. Normal cardiac size. No pneumothorax. Scoliosis of the spine IMPRESSION: Endotracheal tube tip about 5.6 cm superior to carina. Clear lungs. Electronically Signed   By: Donavan Foil M.D.   On: 06/25/2022 19:57   CT Head Wo Contrast  Result Date: 06/24/2022 CLINICAL DATA:  Bilateral lower extremity weakness x2 months. EXAM: CT HEAD WITHOUT CONTRAST TECHNIQUE: Contiguous axial images were obtained from the  base of the skull through the vertex without intravenous contrast. RADIATION DOSE REDUCTION: This exam was performed according to the departmental dose-optimization program which includes automated exposure control, adjustment of the mA and/or kV according to patient size and/or use of iterative reconstruction technique. COMPARISON:  None Available. FINDINGS: Brain: No evidence of acute infarction, hemorrhage, hydrocephalus, extra-axial collection or mass lesion/mass effect. Vascular: No hyperdense vessel or unexpected calcification. Skull: Normal. Negative for fracture or focal lesion. Sinuses/Orbits: No acute finding. Other: None. IMPRESSION: No acute intracranial pathology. Electronically Signed   By: Virgina Norfolk M.D.   On: 06/24/2022 19:44   MR Lumbar Spine W Wo Contrast  Result Date: 06/24/2022 CLINICAL DATA:  Provided history: Low back pain, symptoms persist with greater than 6 weeks treatment. Additional history provided by the scanning technologist: The patient reports back pain, bilateral lower extremity numbness and decreased sensation (right greater than left). Right leg slightly weaker than left. EXAM: MRI LUMBAR SPINE WITHOUT AND WITH CONTRAST TECHNIQUE: Multiplanar and multiecho pulse sequences of the lumbar  spine were obtained without and with intravenous contrast. CONTRAST:  60m GADAVIST GADOBUTROL 1 MMOL/ML IV SOLN COMPARISON:  Lumbar spine radiographs 03/23/2019. FINDINGS: Segmentation: 5 lumbar vertebrae. The caudal most well-formed interval disc space is designated L5-S1. Alignment: Levocurvature of the lumbar spine. No significant spondylolisthesis. Vertebrae: Vertebral body height is maintained. Mild degenerative endplate signal changes at L2-L3 and L3-L4. Conus medullaris and cauda equina: Conus extends to the L2 level. No signal abnormality identified within the visualized distal spinal cord. No pathologic enhancement of the visualized distal spinal cord or cauda equina nerve roots. Paraspinal and other soft tissues: No paraspinal mass or collection. No acute finding within included portions of the abdomen/retroperitoneum. Disc levels: Mild disc degeneration at L5-S1. T12-L1: No significant disc herniation or stenosis. L1-L2: No significant disc herniation or stenosis. L2-L3: No significant disc herniation or stenosis. L3-L4: Slight disc bulge. No significant spinal canal or foraminal stenosis. L4-L5: No significant disc herniation or stenosis. L5-S1: Small left subarticular disc protrusion (at site of posterior annular fissure) (for as seen on series 5, image 9) (series 8, image 37). The disc protrusion results in slight left subarticular narrowing, contacting the descending left S1 nerve root. No significant central canal stenosis or neural foraminal narrowing. IMPRESSION: Lumbar spondylosis, as outlined and with findings most notably as follows. At L5-S1, there is mild disc degeneration. Small left subarticular disc protrusion (at site of posterior annular fissure). The disc protrusion results in slight left subarticular narrowing, and contacts the descending left S1 nerve root. No significant central canal stenosis. Levocurvature of the lumbar spine. Electronically Signed   By: KKellie SimmeringD.O.   On:  06/24/2022 19:07   [place] Limited RUQ (LIVER/GB)  Result Date: 06/24/2022 CLINICAL DATA:  Right upper quadrant abdominal pain. EXAM: ULTRASOUND ABDOMEN LIMITED RIGHT UPPER QUADRANT COMPARISON:  June 03, 2022. FINDINGS: Gallbladder: Sludge and probable cholelithiasis is noted within the gallbladder lumen. No gallbladder wall thickening or pericholecystic fluid is noted. No sonographic Murphy's sign is noted. Common bile duct: Diameter: 3 mm which is within normal limits. Liver: No focal lesion identified. Within normal limits in parenchymal echogenicity. Portal vein is patent on color Doppler imaging with normal direction of blood flow towards the liver. Other: None. IMPRESSION: Cholelithiasis and gallbladder sludge is noted without evidence of cholecystitis. Electronically Signed   By: JMarijo ConceptionM.D.   On: 06/24/2022 15:11    Microbiology: Recent Results (from the past 240 hour(s))  Aerobic/Anaerobic Culture  w Gram Stain (surgical/deep wound)     Status: None   Collection Time: 07/03/22  3:34 PM   Specimen: Abscess  Result Value Ref Range Status   Specimen Description   Final    ABSCESS Performed at [facility] [address] [place], [place] [address]    Special Requests   Final    Normal Performed at [facility], [place] [address], [place], [place] [address]    Gram Stain   Final    RARE WBC PRESENT,BOTH PMN AND MONONUCLEAR RARE BUDDING YEAST SEEN    Culture   Final    RARE CANDIDA ALBICANS NO ANAEROBES ISOLATED Performed at [facility], [address], [place], [place] [address]    Report Status 07/08/2022 FINAL  Final     Labs: Basic Metabolic Panel: Recent Labs  Lab 07/08/22 0447 07/09/22 0113 07/10/22 0233 07/11/22 0447 07/12/22 0504  NA 127* 134* 129* 133* 133*  K 3.9 4.0 4.0 5.0 3.7  CL 96* 98 97* 101 98  CO2 '25 27 25 25 25  '$ GLUCOSE 106* 100* 96 295* 83  BUN '13 11 9 10 10  '$ CREATININE 0.51* 0.53* 0.60*  0.55* 0.57*  CALCIUM 8.3* 8.7* 8.4* 8.6* 8.8*  MG 1.9 2.3 1.9 2.1 1.7  PHOS 3.4 3.5 4.0 4.5 4.1   Liver Function Tests: Recent Labs  Lab 07/06/22 0301 07/08/22 0447 07/09/22 0113 07/10/22 0233 07/12/22 0504  AST '15 15 17 15 16  '$ ALT '17 19 20 17 19  '$ ALKPHOS 133* 153* 147* 110 95  BILITOT 0.8 0.5 0.6 0.4 0.4  PROT 6.1* 7.0 7.3 6.6 7.2  ALBUMIN 2.2* 2.4* 2.7* 2.4* 3.1*   No results for input(s): "LIPASE", "AMYLASE" in the last 168 hours. No results for input(s): "AMMONIA" in the last 168 hours. CBC: Recent Labs  Lab 07/08/22 0447 07/09/22 0113 07/10/22 0233 07/11/22 0447 07/12/22 0504  WBC 17.4* 12.5* 10.3 10.0 8.5  NEUTROABS 13.6* 8.7* 6.7 7.0 5.2  HGB 7.4* 7.6* 7.3* 7.5* 7.3*  HCT 21.9* 22.6* 21.7* 22.5* 22.2*  MCV 85.2 85.3 85.4 88.2 87.1  PLT 1,048*  1,037* 1,146* 1,025* 982* 914*   Cardiac Enzymes: No results for input(s): "CKTOTAL", "CKMB", "CKMBINDEX", "TROPONINI" in the last 168 hours. BNP: BNP (last 3 results) No results for input(s): "BNP" in the last 8760 hours.  ProBNP (last 3 results) No results for input(s): "PROBNP" in the last 8760 hours.  CBG: No results for input(s): "GLUCAP" in the last 168 hours.     Signed:  Irine Seal MD.  [facility] 07/12/2022, 1:10 PM

## 2022-07-12 NOTE — Progress Notes (Signed)
Gabriel Dalton [id number] [birth year]  CARE TEAM:  PCP: Marrian Salvage, [place]  Outpatient Care Team: Patient Care Team: Marrian Salvage, [place] as PCP - General (Internal Medicine) Marchia Bond, MD as Consulting Physician (Orthopedic Surgery)  Inpatient Treatment Team: Treatment Team: Attending Provider: Eugenie Filler, MD; Consulting Physician: Edison Pace, Md, MD; Rounding Team: Fatima Blank, MD; Rounding Team: Dorthy Cooler Radiology, MD; Registered Nurse: Dorinda Hill, RN; Utilization Review: Claudie Leach, RN; Licensed Practical Nurse: Loree Fee, LPN; Case Manager: Addison Naegeli, RN; Pharmacist: Luiz Ochoa, [facility]   Problem List:   Principal Problem:   Cholelithiasis Active Problems:   Pain of upper abdomen   Severe protein-calorie malnutrition ([place])   Small bowel perforation ([place])   Endotracheal tube present   Gallstones   Peritonitis ([place])   Endotracheally intubated   Electrolyte abnormality   Benign prostatic hyperplasia   Abdominal fluid collection   15 Days Post-Op  06/27/2022  Procedure(s): EXPLORATORY LAPAROTOMY, [place] OUT,  ABDOMINAL CLOSURE    Assessment  Improving  [facility] Stay = 16 days)  Assessment/Plan POD 17/15 s/p ex lap with SBR and washout for feculent peritonitis for SB perforation from SBO by Dr. Thermon Leyland  06/25/22; S/p ex lap abdominal closure 2/16 - Feculent peritonitis found in OR with 2 SB perforations noted from obstructive process.  Unclear etiology. Mother had something similar and wife states sister is having some GI issues, ? Need for further work up to rule out [employer] or other etiology  - S/p IR perc drain 2/22, CT 2/29 w/ improvement in fluid collection and mild ileus . Cx's with Rare Candida Albicans  - discussed with ID, IV fluconazole and IV zosyn. WBC normalized.   Most likely would benefit for follow-up with drain study in about 2 weeks.  Interventional radiology  notes that yesterday.  Hopefully that is being arranged by them.  D/w [facility] Dr. Grandville Silos who is primary today.  He notes that is being set up.  - Continue soft diet and protein shakes. Off TPN 3/1 - Continue bid wet-to-dry dressing changes to midline abdominal wound; add abdominal binder while OOB. - Pathology on small bowel benign with significant luminal narrowing at one point - Mobilize, pulm toilet  - from a surgical standpoint, OK to d/c.  Pain control improving on PO meds. He is tolerating PO and will continue to advance his diet at home. His drain is functioning properly and IR plans to see him in their clinic. He is mobilizing and has a safe dispo home with his wife. [facility] appt set up in 2 weeks. Abx per ID   FEN - Soft diet, ensure VTE - SCDs, Lovenox ID - Zosyn 2/13 -->, fluconazole 2/26>> Foley - out 2/21, voiding well Acute postoperative anemia in the setting of chronic disease.  Hemoglobin stable.  Could consider iron supplementation but most likely will resolve gradually.   Post op acute respiratory failure - resolved    I reviewed nursing notes, Consultant IR, ID notes, hospitalist notes, last 24 h vitals and pain scores, last 48 h intake and output, last 24 h labs and trends, and last 24 h imaging results. I have reviewed this patient's available data, including medical history, events of note, test results, etc as part of my evaluation.  A significant portion of that time was spent in counseling.  Care during the described time interval was provided by me.  This care required high  level of medical decision making.  07/12/2022  Subjective: (Chief complaint)  Patient in bed looking at phone for exercise equipment.  Feeling much better overall.    Spouse in room.  Tolerating solid diet.  Hoping to go home.  Objective:  Vital signs:  Vitals:   07/11/22 0513 07/11/22 1226 07/11/22 2122 07/12/22 0534  BP: 126/82 132/89 137/85 121/81  Pulse: 92 93 94 86  Resp: '16  16 16 16  '$ Temp: 98.3 F (36.8 C) 98.5 F (36.9 C) 98.6 F (37 C) 98.4 F (36.9 C)  TempSrc: Oral Oral Oral Oral  SpO2: 100% 100% 100% 100%  Weight:      Height:        Last BM Date : 07/11/22  Intake/Output   Yesterday:  03/01 0701 - 03/02 0700 In: 2756.3 [P.O.:1320; I.V.:1179.2; IV Piggyback:247.1] Out: 1537 [Urine:1502; Drains:35] This shift:  Total I/O In: 5 [Other:5] Out: 5 [Drains:5]  Bowel function:  Flatus: YES  BM:  YES  Drain: Purulent   Physical Exam:  General: Pt awake/alert in no acute distress.  16 or sickly. Eyes: PERRL, normal EOM.  Sclera clear.  No icterus Neuro: CN II-XII intact w/o focal sensory/motor deficits. Lymph: No head/neck/groin lymphadenopathy Psych:  No delerium/psychosis/paranoia.  Oriented x 4 HENT: Normocephalic, Mucus membranes moist.  No thrush Neck: Supple, No tracheal deviation.  No obvious thyromegaly Chest: No pain to chest wall compression.  Good respiratory excursion.  No audible wheezing CV:  Pulses intact.  Regular rhythm.  No major extremity edema MS: Normal AROM mjr joints.  No obvious deformity  Abdomen: Soft.  Nondistended. Wound with clean dressing in place.  Propria tenderness around the abdomen only.  Drain on left lower back with scant purulent drainage..  No evidence of peritonitis.  No incarcerated hernias.  Ext:   No deformity.  No mjr edema.  No cyanosis Skin: No petechiae / purpurea.  No major sores.  Warm and dry    Results:   Cultures: Recent Results (from the past 720 hour(s))  MRSA Next Gen by PCR, Nasal     Status: None   Collection Time: 06/25/22 10:10 PM   Specimen: Nasal Mucosa; Nasal Swab  Result Value Ref Range Status   MRSA by PCR Next Gen NOT DETECTED NOT DETECTED Final    Comment: (NOTE) The GeneXpert MRSA Assay (FDA approved for NASAL specimens only), is one component of a comprehensive MRSA colonization surveillance program. It is not intended to diagnose MRSA infection nor to  guide or monitor treatment for MRSA infections. Test performance is not FDA approved in patients less than 36 years old. Performed at [facility], [place] [address], [place], [place] [address]   Aerobic/Anaerobic Culture w Gram Stain (surgical/deep wound)     Status: None   Collection Time: 07/03/22  3:34 PM   Specimen: Abscess  Result Value Ref Range Status   Specimen Description   Final    ABSCESS Performed at [place] [address], [place], [place] [address]    Special Requests   Final    Normal Performed at [facility], [place] [address], [place], [place] [address]    Gram Stain   Final    RARE WBC PRESENT,BOTH PMN AND MONONUCLEAR RARE BUDDING YEAST SEEN    Culture   Final    RARE CANDIDA ALBICANS NO ANAEROBES ISOLATED Performed at [facility], [address] [place], [place] [address]    Report Status 07/08/2022 FINAL  Final    Labs: Results  for orders placed or performed during the hospital encounter of 06/24/22 (from the past 48 hour(s))  Magnesium     Status: None   Collection Time: 07/11/22  4:47 AM  Result Value Ref Range   Magnesium 2.1 1.7 - 2.4 mg/dL    Comment: Performed at [facility], [place] [address], [place], [place] [address]  Phosphorus     Status: None   Collection Time: 07/11/22  4:47 AM  Result Value Ref Range   Phosphorus 4.5 2.5 - 4.6 mg/dL    Comment: Performed at [facility], [place] [address], [place], [place] [address]  CBC with Differential/Platelet     Status: Abnormal   Collection Time: 07/11/22  4:47 AM  Result Value Ref Range   WBC 10.0 4.0 - 10.5 K/uL   RBC 2.55 (L) 4.22 - 5.81 MIL/uL   Hemoglobin 7.5 (L) 13.0 - 17.0 g/dL   HCT 22.5 (L) 39.0 - 52.0 %   MCV 88.2 80.0 - 100.0 fL   MCH 29.4 26.0 - 34.0 pg   MCHC 33.3 30.0 - 36.0 g/dL   RDW 17.6 (H) 11.5 - 15.5 %   Platelets 982 (HH) 150 - 400 K/uL    Comment: CRITICAL VALUE NOTED.   VALUE IS CONSISTENT WITH PREVIOUSLY REPORTED AND CALLED VALUE. REPEATED TO VERIFY    nRBC 0.0 0.0 - 0.2 %   Neutrophils Relative % 70 %   Neutro Abs 7.0 1.7 - 7.7 K/uL   Lymphocytes Relative 17 %   Lymphs Abs 1.7 0.7 - 4.0 K/uL   Monocytes Relative 10 %   Monocytes Absolute 1.0 0.1 - 1.0 K/uL   Eosinophils Relative 2 %   Eosinophils Absolute 0.2 0.0 - 0.5 K/uL   Basophils Relative 0 %   Basophils Absolute 0.0 0.0 - 0.1 K/uL   Immature Granulocytes 1 %   Abs Immature Granulocytes 0.05 0.00 - 0.07 K/uL    Comment: Performed at [facility], [place] [address], [place], [place] [id number]  Basic metabolic panel     Status: Abnormal   Collection Time: 07/11/22  4:47 AM  Result Value Ref Range   Sodium 133 (L) 135 - 145 mmol/L   Potassium 5.0 3.5 - 5.1 mmol/L   Chloride 101 98 - 111 mmol/L   CO2 25 22 - 32 mmol/L   Glucose, Bld 295 (H) 70 - 99 mg/dL    Comment: Glucose reference range applies only to samples taken after fasting for at least 8 hours.   BUN 10 6 - 20 mg/dL   Creatinine, Ser 0.55 (L) 0.61 - 1.24 mg/dL   Calcium 8.6 (L) 8.9 - 10.3 mg/dL   GFR, Estimated >60 >60 mL/min    Comment: (NOTE) Calculated using the CKD-EPI Creatinine Equation (2021)    Anion gap 7 5 - 15    Comment: Performed at [facility], [place] [address] [place], [place] [address]  Magnesium     Status: None   Collection Time: 07/12/22  5:04 AM  Result Value Ref Range   Magnesium 1.7 1.7 - 2.4 mg/dL    Comment: Performed at [facility], [place] [address], [place], [place] [address]  Phosphorus     Status: None   Collection Time: 07/12/22  5:04 AM  Result Value Ref Range   Phosphorus 4.1 2.5 - 4.6 mg/dL    Comment: Performed at [facility], [place] [address], [place],  [address]  CBC with Differential/Platelet  Status: Abnormal   Collection Time: 07/12/22  5:04 AM  Result Value Ref Range   WBC 8.5 4.0 - 10.5 K/uL   RBC  2.55 (L) 4.22 - 5.81 MIL/uL   Hemoglobin 7.3 (L) 13.0 - 17.0 g/dL   HCT 22.2 (L) 39.0 - 52.0 %   MCV 87.1 80.0 - 100.0 fL   MCH 28.6 26.0 - 34.0 pg   MCHC 32.9 30.0 - 36.0 g/dL   RDW 17.5 (H) 11.5 - 15.5 %   Platelets 914 (HH) 150 - 400 K/uL    Comment: CRITICAL VALUE NOTED.  VALUE IS CONSISTENT WITH PREVIOUSLY REPORTED AND CALLED VALUE. REPEATED TO VERIFY    nRBC 0.0 0.0 - 0.2 %   Neutrophils Relative % 62 %   Neutro Abs 5.2 1.7 - 7.7 K/uL   Lymphocytes Relative 23 %   Lymphs Abs 1.9 0.7 - 4.0 K/uL   Monocytes Relative 11 %   Monocytes Absolute 0.9 0.1 - 1.0 K/uL   Eosinophils Relative 3 %   Eosinophils Absolute 0.3 0.0 - 0.5 K/uL   Basophils Relative 0 %   Basophils Absolute 0.0 0.0 - 0.1 K/uL   Immature Granulocytes 1 %   Abs Immature Granulocytes 0.05 0.00 - 0.07 K/uL    Comment: Performed at [facility], [place] [address], [place], [place] [address]  Comprehensive metabolic panel     Status: Abnormal   Collection Time: 07/12/22  5:04 AM  Result Value Ref Range   Sodium 133 (L) 135 - 145 mmol/L   Potassium 3.7 3.5 - 5.1 mmol/L   Chloride 98 98 - 111 mmol/L   CO2 25 22 - 32 mmol/L   Glucose, Bld 83 70 - 99 mg/dL    Comment: Glucose reference range applies only to samples taken after fasting for at least 8 hours.   BUN 10 6 - 20 mg/dL   Creatinine, Ser 0.57 (L) 0.61 - 1.24 mg/dL   Calcium 8.8 (L) 8.9 - 10.3 mg/dL   Total Protein 7.2 6.5 - 8.1 g/dL   Albumin 3.1 (L) 3.5 - 5.0 g/dL   AST 16 15 - 41 U/L   ALT 19 0 - 44 U/L   Alkaline Phosphatase 95 38 - 126 U/L   Total Bilirubin 0.4 0.3 - 1.2 mg/dL   GFR, Estimated >60 >60 mL/min    Comment: (NOTE) Calculated using the CKD-EPI Creatinine Equation (2021)    Anion gap 10 5 - 15    Comment: Performed at [facility], [place] [address], [place], [place] [address]    Imaging / Studies: CT ABDOMEN PELVIS W CONTRAST  Result Date: 07/10/2022 CLINICAL DATA:  Intra-abdominal abscess. Sepsis.  Small bowel perforation. EXAM: CT ABDOMEN AND PELVIS WITH CONTRAST TECHNIQUE: Multidetector CT imaging of the abdomen and pelvis was performed using the standard protocol following bolus administration of intravenous contrast. RADIATION DOSE REDUCTION: This exam was performed according to the departmental dose-optimization program which includes automated exposure control, adjustment of the mA and/or kV according to patient size and/or use of iterative reconstruction technique. CONTRAST:  147m OMNIPAQUE IOHEXOL 300 MG/ML  SOLN COMPARISON:  07/03/2022 FINDINGS: Lower chest: LEFT pleural effusion and LEFT basilar atelectasis. Heart size is normal. Hepatobiliary: No focal liver abnormality is seen. No radiopaque gallstones, biliary dilatation, or pericholecystic inflammatory changes. Small amount of gas identified anterior to the liver, consistent with free intraperitoneal air from recent surgery. Pancreas: Unremarkable. No pancreatic ductal dilatation or surrounding inflammatory changes. Spleen: Normal in size without  focal abnormality. Adrenals/Urinary Tract: Adrenal glands are unremarkable. Kidneys are symmetric in size. No suspicious renal lesion. No hydronephrosis. Visualized courses of the ureters are unremarkable. The bladder and visualized portion of the urethra are normal. Stomach/Bowel: Stomach is normal in appearance. The small bowel loops are mildly dilated without evidence for obstruction. Bowel sutures are identified in the RIGHT mid abdomen. Air-fluid levels throughout the bowel loops. There has been improvement in small fluid collection in the RIGHT mid abdomen. No evidence for abscess. Vascular/Lymphatic: There is minimal atherosclerotic calcification of the abdominal aorta. Reproductive: Prostate is unremarkable. Other: Interval placement of LEFT sub diaphragmatic catheter. A small crescentic fluid collection persists, measuring approximately 7 centimeters in length and 1.3 centimeters in width  (image 66 of series 5). Cachectic. Diffuse body wall edema. Musculoskeletal: No acute or significant osseous findings. IMPRESSION: 1. Interval placement of LEFT sub diaphragmatic catheter. There has been improvement in this fluid collection with small residual crescentic collection, measuring 7 centimeters in length and 1.3 centimeters in width. 2. Small locules of free intraperitoneal air, consistent with recent surgery. 3. Mildly dilated small bowel loops without evidence for obstruction. 4. Air-fluid levels throughout the bowel loops, consistent with ileus. 5. LEFT pleural effusion and LEFT basilar atelectasis. 6. Cachectic. Diffuse body wall edema. Electronically Signed   By: Nolon Nations M.D.   On: 07/10/2022 13:02    Medications / Allergies: per chart  Antibiotics: Anti-infectives (From admission, onward)    Start     Dose/Rate Route Frequency Ordered Stop   07/13/22 0000  fluconazole (DIFLUCAN) 200 MG tablet        400 mg Oral Daily 07/11/22 1616 07/23/22 2359   07/12/22 1200  fluconazole (DIFLUCAN) tablet 400 mg        400 mg Oral Daily 07/12/22 0946 07/22/22 1159   07/12/22 0000  amoxicillin-clavulanate (AUGMENTIN) 875-125 MG tablet        1 tablet Oral 2 times daily 07/11/22 1616 07/22/22 2359   07/07/22 1215  fluconazole (DIFLUCAN) IVPB 400 mg  Status:  Discontinued        400 mg 100 mL/hr over 120 Minutes Intravenous Every 24 hours 07/07/22 1118 07/11/22 1336   06/26/22 0000  piperacillin-tazobactam (ZOSYN) IVPB 3.375 g        3.375 g 12.5 mL/hr over 240 Minutes Intravenous Every 8 hours 06/25/22 1832     06/25/22 1815  piperacillin-tazobactam (ZOSYN) IVPB 3.375 g        3.375 g 100 mL/hr over 30 Minutes Intravenous  Once 06/25/22 1802 06/25/22 1830   06/25/22 1756  piperacillin-tazobactam (ZOSYN) 3.375 GM/50ML IVPB       Note to Pharmacy: Ward, Christa K: cabinet override      06/25/22 1756 06/27/22 0333   06/25/22 1630  ceFAZolin (ANCEF) IVPB 2g/100 mL premix        2  g 200 mL/hr over 30 Minutes Intravenous  Once 06/25/22 1534 06/25/22 1640         Note: Portions of this report may have been transcribed using voice recognition software. Every effort was made to ensure accuracy; however, inadvertent computerized transcription errors may be present.   Any transcriptional errors that result from this process are unintentional.    Adin Hector, MD, FACS, MASCRS Esophageal, Gastrointestinal & Colorectal Surgery Robotic and Minimally Invasive Surgery  [facility]. [address] [place], [place] [address] [phone] Fax [phone] Main  CONTACT INFORMATION:  Weekday (9AM-5PM): Call  [facility] main office at [phone]  Weeknight (5PM-9AM) or Weekend/Holiday: Check [email or web address] (password " [email or web address]") for General Surgery [facility] coverage  (Please, do not use SecureChat as it is not reliable communication to reach operating surgeons for immediate patient care given surgeries/outpatient duties/clinic/cross-coverage/off post-call which would lead to a delay in care.  Epic staff messaging available for outptient concerns, but may not be answered for 48 hours or more).     07/12/2022  9:51 AM

## 2022-07-12 NOTE — Progress Notes (Signed)
Reviewed written d/c instructions w pt and his wife and all questions answered. They both verbalized understanding. Reviewed care of JP drain, flushing and dsg change, demo given and they both vverbalized understanding. Also reviewed wet/dry dsg changes to abd, demo given and they both verb understanding. Comfirmed w [facility] that they have his d/c meds ready, wife stated she wanted to pick them up herself. D/C via w/c w all belongings in stable conditon.

## 2022-07-14 ENCOUNTER — Other Ambulatory Visit (HOSPITAL_COMMUNITY): Payer: Self-pay

## 2022-07-14 ENCOUNTER — Telehealth: Payer: Self-pay | Admitting: Family

## 2022-07-14 ENCOUNTER — Other Ambulatory Visit: Payer: Self-pay | Admitting: Surgery

## 2022-07-14 ENCOUNTER — Telehealth: Payer: Self-pay

## 2022-07-14 DIAGNOSIS — R188 Other ascites: Secondary | ICD-10-CM

## 2022-07-14 NOTE — Telephone Encounter (Signed)
Caller/Agency: [facility] Number: [phone] Requesting OT/PT/Skilled Nursing/Social Work/Speech Therapy:  Frequency:  Nursing eval physical therapy 1 w 4

## 2022-07-14 NOTE — Transitions of Care (Post Inpatient/ED Visit) (Signed)
   07/14/2022  Name: Gabriel Dalton MRN: [id number] DOB: [birth year]  Today's TOC FU Call Status: Today's TOC FU Call Status:: Successful TOC FU Call Competed TOC FU Call Complete Date: 07/14/22  Transition Care Management Follow-up Telephone Call Discharge Facility: [name]) Type of Discharge: Inpatient Admission Primary Inpatient Discharge Diagnosis:: abd pain How have you been since you were released from the hospital?: Better Any questions or concerns?: No  Items Reviewed: Did you receive and understand the discharge instructions provided?: Yes Medications obtained and verified?: Yes (Medications Reviewed) Any new allergies since your discharge?: No Dietary orders reviewed?: Yes Do you have support at home?: Yes People in Home: spouse  Home Care and Equipment/Supplies: [place] Ordered?: NA Any new equipment or medical supplies ordered?: NA  Functional Questionnaire: Do you need assistance with bathing/showering or dressing?: No Do you need assistance with meal preparation?: No Do you need assistance with eating?: No Do you have difficulty maintaining continence: No Do you need assistance with getting out of bed/getting out of a chair/moving?: No Do you have difficulty managing or taking your medications?: No  Folllow up appointments reviewed: PCP Follow-up appointment confirmed?: Yes Date of PCP follow-up appointment?: 07/22/22 Follow-up Provider: [facility] Follow-up appointment confirmed?: Yes Date of Specialist follow-up appointment?: 07/29/22 Follow-Up Specialty Provider:: Dr Thermon Leyland Do you need transportation to your follow-up appointment?: No Do you understand care options if your condition(s) worsen?: Yes-patient verbalized understanding    [place], [place] Nurse Health Advisor Direct Dial [phone]

## 2022-07-14 NOTE — Telephone Encounter (Signed)
Orders have been given.

## 2022-07-17 ENCOUNTER — Other Ambulatory Visit (HOSPITAL_COMMUNITY): Payer: Self-pay

## 2022-07-21 ENCOUNTER — Encounter: Payer: Self-pay | Admitting: Gastroenterology

## 2022-07-21 ENCOUNTER — Ambulatory Visit: Payer: 59 | Admitting: Gastroenterology

## 2022-07-21 ENCOUNTER — Other Ambulatory Visit: Payer: Self-pay

## 2022-07-22 ENCOUNTER — Other Ambulatory Visit: Payer: Self-pay

## 2022-07-22 ENCOUNTER — Encounter: Payer: Self-pay | Admitting: Internal Medicine

## 2022-07-22 ENCOUNTER — Inpatient Hospital Stay: Payer: 59 | Admitting: Family

## 2022-07-22 ENCOUNTER — Ambulatory Visit: Payer: 59 | Admitting: Internal Medicine

## 2022-07-22 VITALS — BP 112/80 | HR 99 | Temp 98.3°F | Ht 68.0 in | Wt 85.0 lb

## 2022-07-22 DIAGNOSIS — R6881 Early satiety: Secondary | ICD-10-CM | POA: Diagnosis not present

## 2022-07-22 DIAGNOSIS — K5900 Constipation, unspecified: Secondary | ICD-10-CM

## 2022-07-22 DIAGNOSIS — K651 Peritoneal abscess: Secondary | ICD-10-CM | POA: Diagnosis not present

## 2022-07-22 MED ORDER — MIRTAZAPINE 15 MG PO TABS: 15.0000 mg | ORAL_TABLET | Freq: Every day | ORAL | 3 refills | Status: DC

## 2022-07-22 MED ORDER — MEDIHONEY WOUND/BURN DRESSING EX PSTE: 1.0000 | PASTE | Freq: Every day | CUTANEOUS | 1 refills | Status: DC

## 2022-07-22 NOTE — Progress Notes (Signed)
Patient ID: Gabriel Dalton, male   DOB: May 11, 1986, 37 y.o.   MRN: [id number]  HPI Gabriel Dalton is a 37yo  M with  Intra-abdominal infection, spontaneous sb perforation s/p repair   Interval placement of LEFT sub diaphragmatic catheter. There has been improvement in this fluid collection with small residual crescentic collection, measuring 7 centimeters in length and 1.3cm width  Outpatient Encounter Medications as of 07/22/2022  Medication Sig   acetaminophen (TYLENOL) 500 MG tablet Take 2 tablets (1,000 mg total) by mouth every 6 (six) hours.   amoxicillin-clavulanate (AUGMENTIN) 875-125 MG tablet Take 1 tablet by mouth 2 (two) times daily for 10 days.   dicyclomine (BENTYL) 20 MG tablet Take 1 tablet (20 mg total) by mouth 2 (two) times daily. (Patient taking differently: Take 20 mg by mouth daily as needed for spasms.)   feeding supplement (ENSURE ENLIVE / ENSURE PLUS) LIQD Take 237 mLs by mouth 3 (three) times daily between meals.   fluconazole (DIFLUCAN) 200 MG tablet Take 2 tablets (400 mg total) by mouth daily for 10 days.   fluticasone (FLONASE) 50 MCG/ACT nasal spray Place 1 spray into both nostrils daily.   folic acid (FOLVITE) 1 MG tablet Take 1 tablet (1 mg total) by mouth daily.   linaclotide (LINZESS) 145 MCG CAPS capsule Take 1 capsule (145 mcg total) by mouth daily before breakfast. (Patient taking differently: Take 145 mcg by mouth as needed (constipation).)   methocarbamol (ROBAXIN) 500 MG tablet Take 1 tablet (500 mg total) by mouth every 8 (eight) hours as needed for muscle spasms.   ondansetron (ZOFRAN) 4 MG tablet Take 1 tablet (4 mg total) by mouth every 8 (eight) hours as needed for nausea or vomiting.   oxyCODONE 10 MG TABS Take 1-1.5 tablets (10-15 mg total) by mouth every 6 (six) hours as needed for moderate pain or severe pain.   pantoprazole (PROTONIX) 40 MG tablet Take 1 tablet (40 mg total) by mouth daily.   simethicone (MYLICON) 80 MG chewable  tablet Chew 1 tablet (80 mg total) by mouth 4 (four) times daily as needed for flatulence.   Sodium Chloride Flush (NORMAL SALINE FLUSH) 0.9 % SOLN Flush with 5 mLs by Intracatheter route daily.   sucralfate (CARAFATE) 1 g tablet Take 1 tablet (1 g total) by mouth 4 (four) times daily -  with meals and at bedtime. (Patient taking differently: Take 1 g by mouth as needed (heartburn).)   tamsulosin (FLOMAX) 0.4 MG CAPS capsule Take 0.4 mg by mouth daily.   No facility-administered encounter medications on file as of 07/22/2022.     Patient Active Problem List   Diagnosis Date Noted   Abdominal fluid collection 07/11/2022   Electrolyte abnormality 07/09/2022   Benign prostatic hyperplasia 07/09/2022   Peritonitis (HCC) 06/26/2022   Endotracheally intubated 06/26/2022   Severe protein-calorie malnutrition (HCC) 06/25/2022   Small bowel perforation (HCC) 06/25/2022   Endotracheal tube present 06/25/2022   Gallstones 06/25/2022   Cholelithiasis 06/24/2022   Early satiety 05/27/2022   Pain of upper abdomen 05/27/2022   Nausea and vomiting 05/27/2022   Esophagitis determined by endoscopy 04/23/2022   Constipation 12/26/2021   Abnormal colonoscopy 12/26/2021   Gastroesophageal reflux disease 12/26/2021   Intractable abdominal pain 04/25/2019   Hyperbilirubinemia 04/25/2019   Anemia 04/25/2019   Leukocytosis 04/25/2019   Gastric ulcers 04/25/2019   Ulcerated mucosa of rectum 04/25/2019   Melena 04/25/2019   Generalized abdominal pain 04/01/2019   Diarrhea 04/01/2019   Loss of  weight 04/01/2019   Screening for viral disease 04/01/2019   Black stools 04/01/2019     Health Maintenance Due  Topic Date Due   COVID-19 Vaccine (1) Never done   Hepatitis C Screening  Never done   DTaP/Tdap/Td (1 - Tdap) Never done     Review of Systems  Physical Exam   Ht 5\' 8"  (1.727 m)   Wt 85 lb (38.6 kg)   BMI 12.92 kg/m    Physical Exam  Constitutional: He is oriented to person, place,  and time. He appears well-developed and well-nourished. No distress.  HENT:  Mouth/Throat: Oropharynx is clear and moist. No oropharyngeal exudate.  Cardiovascular: Normal rate, regular rhythm and normal heart sounds. Exam reveals no gallop and no friction rub.  No murmur heard.  Pulmonary/Chest: Effort normal and breath sounds normal. No respiratory distress. He has no wheezes.  Abdominal: Soft. Bowel sounds are normal. He exhibits no distension. Granulation tissue in wound bed Lymphadenopathy:  He has no cervical adenopathy.  Neurological: He is alert and oriented to person, place, and time.  Skin: Skin is warm and dry. No rash noted. No erythema.  Psychiatric: He has a normal mood and affect. His behavior is normal.    CBC Lab Results  Component Value Date   WBC 8.5 07/12/2022   RBC 2.55 (L) 07/12/2022   HGB 7.3 (L) 07/12/2022   HCT 22.2 (L) 07/12/2022   PLT 914 (HH) 07/12/2022   MCV 87.1 07/12/2022   MCH 28.6 07/12/2022   MCHC 32.9 07/12/2022   RDW 17.5 (H) 07/12/2022   LYMPHSABS 1.9 07/12/2022   MONOABS 0.9 07/12/2022   EOSABS 0.3 07/12/2022    BMET Lab Results  Component Value Date   NA 133 (L) 07/12/2022   K 3.7 07/12/2022   CL 98 07/12/2022   CO2 25 07/12/2022   GLUCOSE 83 07/12/2022   BUN 10 07/12/2022   CREATININE 0.57 (L) 07/12/2022   CALCIUM 8.8 (L) 07/12/2022   GFRNONAA >60 07/12/2022   GFRAA >60 04/28/2019     Assessment and Plan Intra-abdominal abscess = has finished abtx. We will Needs repeat imaging to evaluate residual fluid collection- due tomorrow  To see if needs further abtx  Pressure ulcer- medihoney  Appetite stimulation = gave remeron  Constipation = start metamucil  Addendum = no need for further abtx

## 2022-07-23 ENCOUNTER — Telehealth: Payer: Self-pay | Admitting: Family

## 2022-07-23 ENCOUNTER — Ambulatory Visit
Admission: RE | Admit: 2022-07-23 | Discharge: 2022-07-23 | Disposition: A | Discharge status: Home / Self Care | Payer: 59 | Source: Ambulatory Visit | Attending: Radiology | Admitting: Radiology

## 2022-07-23 ENCOUNTER — Ambulatory Visit
Admission: RE | Admit: 2022-07-23 | Discharge: 2022-07-23 | Disposition: A | Discharge status: Home / Self Care | Payer: 59 | Source: Ambulatory Visit | Attending: Surgery | Admitting: Surgery

## 2022-07-23 DIAGNOSIS — R188 Other ascites: Secondary | ICD-10-CM

## 2022-07-23 MED ORDER — IOPAMIDOL (ISOVUE-300) INJECTION 61%
75.0000 mL | Freq: Once | INTRAVENOUS | Status: AC | PRN
  Administered 2022-07-23: 75 mL via INTRAVENOUS

## 2022-07-23 NOTE — Progress Notes (Signed)
Chief Complaint: Patient was seen in consultation today for left upper quadrant perisplenic/subdiaphragmatic abscess at the request of Stechschulte,Paul J  Referring Physician(s): Stechschulte,Paul J  History of Present Illness: Gabriel Dalton is a 37 y.o. male s/p ex lap with SBR and washout for feculent peritonitis for SB perforation from SBO by Dr. Thermon Leyland 06/25/22 ; s/p IR LUQ perisplenic fluid collection drain 07/03/22. CT 2/29 showed improvement in fluid collection and mild ileus. Pt d/c from [facility] 07/12/22. He presents to clinic today for evaluation of abscess/drain.  Pt denies fever, chills, abdominal pain, N/V.  He reports flushing with 5 cc NS twice daily. OP ~5 cc /day with no change in color.   Past Medical History:  Diagnosis Date   Bronchitis    Multiple gastric ulcers     Past Surgical History:  Procedure Laterality Date   CHOLECYSTECTOMY N/A 06/25/2022   Procedure: LAPAROSCOPIC CHOLECYSTECTOMY;  Surgeon: Felicie Morn, MD;  Location: [facility];  Service: General;  Laterality: N/A;   COLONOSCOPY     I & D EXTREMITY  10/18/2011   Procedure: IRRIGATION AND DEBRIDEMENT EXTREMITY;  Surgeon: Roseanne Kaufman, MD;  Location: [place];  Service: Orthopedics;  Laterality: Right;  Irrigation and Debridement  Right Middle Finger; Removal of foreign body   LAPAROTOMY N/A 06/27/2022   Procedure: EXPLORATORY LAPAROTOMY, [place] OUT,  ABDOMINAL CLOSURE;  Surgeon: Felicie Morn, MD;  Location: [facility];  Service: General;  Laterality: N/A;    Allergies: Aspirin  Medications: Prior to Admission medications   Medication Sig Start Date End Date Taking? Authorizing Provider  acetaminophen (TYLENOL) 500 MG tablet Take 2 tablets (1,000 mg total) by mouth every 6 (six) hours. 07/11/22   Jill Alexanders, PA-C  dicyclomine (BENTYL) 20 MG tablet Take 1 tablet (20 mg total) by mouth 2 (two) times daily. Patient taking differently: Take 20 mg by mouth daily as  needed for spasms. 05/28/22   Zehr, Janett Billow D, PA-C  feeding supplement (ENSURE ENLIVE / ENSURE PLUS) LIQD Take 237 mLs by mouth 3 (three) times daily between meals. 07/11/22   Eugenie Filler, MD  fluconazole (DIFLUCAN) 200 MG tablet Take 2 tablets (400 mg total) by mouth daily for 10 days. Patient not taking: Reported on 07/22/2022 07/13/22 07/23/22  Eugenie Filler, MD  fluticasone [facility]) 50 MCG/ACT nasal spray Place 1 spray into both nostrils daily. Patient not taking: Reported on 07/22/2022 07/12/22   Eugenie Filler, MD  folic acid (FOLVITE) 1 MG tablet Take 1 tablet (1 mg total) by mouth daily. 07/12/22   Eugenie Filler, MD  leptospermum manuka honey (MEDIHONEY) PSTE paste Apply 1 Application topically daily. 07/22/22   Carlyle Basques, MD  linaclotide [facility]) 145 MCG CAPS capsule Take 1 capsule (145 mcg total) by mouth daily before breakfast. Patient not taking: Reported on 07/22/2022 04/23/22   Zehr, Laban Emperor, PA-C  methocarbamol (ROBAXIN) 500 MG tablet Take 1 tablet (500 mg total) by mouth every 8 (eight) hours as needed for muscle spasms. 07/12/22   Michael Boston, MD  mirtazapine (REMERON) 15 MG tablet Take 1 tablet (15 mg total) by mouth at bedtime. 07/22/22   Carlyle Basques, MD  ondansetron (ZOFRAN) 4 MG tablet Take 1 tablet (4 mg total) by mouth every 8 (eight) hours as needed for nausea or vomiting. Patient not taking: Reported on 07/22/2022 07/11/22   Eugenie Filler, MD  oxyCODONE 10 MG TABS Take 1-1.5 tablets (10-15 mg total) by mouth every 6 (six) hours  as needed for moderate pain or severe pain. 07/11/22   Jill Alexanders, PA-C  pantoprazole (PROTONIX) 40 MG tablet Take 1 tablet (40 mg total) by mouth daily. 07/11/22   Eugenie Filler, MD  simethicone (MYLICON) 80 MG chewable tablet Chew 1 tablet (80 mg total) by mouth 4 (four) times daily as needed for flatulence. 07/11/22   Eugenie Filler, MD  Sodium Chloride Flush (NORMAL SALINE FLUSH) 0.9 % SOLN Flush with 5 mLs by  Intracatheter route daily. Patient not taking: Reported on 07/22/2022 07/11/22   Allred, Darrell K, PA-C  sucralfate (CARAFATE) 1 g tablet Take 1 tablet (1 g total) by mouth 4 (four) times daily -  with meals and at bedtime. Patient not taking: Reported on 07/22/2022 05/28/22   Zehr, Laban Emperor, PA-C  tamsulosin (FLOMAX) 0.4 MG CAPS capsule Take 0.4 mg by mouth daily. Patient not taking: Reported on 07/22/2022 06/20/22   [provider]     Family History  Problem Relation Age of Onset   Healthy Mother    Hypertension Father    Colon cancer Maternal Uncle    Rectal cancer Maternal Uncle    Brain cancer Maternal Uncle    Heart disease Maternal Aunt    Esophageal cancer Neg Hx    Stomach cancer Neg Hx     Social History   Socioeconomic History   Marital status: Married    Spouse name: Not on file   Number of children: 0   Years of education: Not on file   Highest education level: Not on file  Occupational History   Occupation: Truck driver  Tobacco Use   Smoking status: Every Day    Packs/day: 1.00    Types: Cigarettes   Smokeless tobacco: Never  Vaping Use   Vaping Use: Former  Substance and Sexual Activity   Alcohol use: Not Currently   Drug use: Not Currently    Frequency: 3.0 times per week   Sexual activity: Yes    Birth control/protection: None  Other Topics Concern   Not on file  Social History Narrative   Not on file   Social Determinants of Health   Financial Resource Strain: Not on file  Food Insecurity: Not on file  Transportation Needs: Not on file  Physical Activity: Not on file  Stress: Not on file  Social Connections: Not on file     Review of Systems: A 12 point ROS discussed and pertinent positives are indicated in the HPI above.  All other systems are negative.  Review of Systems  Constitutional:  Negative for chills and fever.  Gastrointestinal:  Negative for abdominal pain.    Vital Signs: 147/99, HR 93, 98.2, 99% RA    Physical  Exam Vitals reviewed.  Constitutional:      General: He is not in acute distress.    Appearance: He is ill-appearing.  HENT:     Head: Normocephalic and atraumatic.  Pulmonary:     Effort: Pulmonary effort is normal. No respiratory distress.  Abdominal:     Comments: Left upper quadrant perisplenic/subdiaphragmatic drain in pace with ~5 cc thin, milky OP in JP. Drain removed intact. Mild scabbing noted to insertion site with no bleeding, oozing, swelling or redness. Dressing placed over insertion site.  Skin:    General: Skin is warm and dry.  Neurological:     Mental Status: He is alert and oriented to person, place, and time.  Psychiatric:        Mood and  Affect: Mood normal.        Behavior: Behavior normal.        Thought Content: Thought content normal.        Judgment: Judgment normal.     Mallampati Score:     Imaging: CT ABDOMEN PELVIS W CONTRAST  Result Date: 07/23/2022 CLINICAL DATA:  History of small bowel perforation, resection, postop left subdiaphragmatic abscess post percutaneous drain placement 07/03/2022. Minimal output from drain. EXAM: CT ABDOMEN AND PELVIS WITH CONTRAST TECHNIQUE: Multidetector CT imaging of the abdomen and pelvis was performed using the standard protocol following bolus administration of intravenous contrast. RADIATION DOSE REDUCTION: This exam was performed according to the departmental dose-optimization program which includes automated exposure control, adjustment of the mA and/or kV according to patient size and/or use of iterative reconstruction technique. CONTRAST:  40m ISOVUE-300 IOPAMIDOL (ISOVUE-300) INJECTION 61% COMPARISON:  07/10/2022 and previous FINDINGS: Lower chest: No pleural or pericardial effusion. Linear scarring or atelectasis in the inferior lingula. Hepatobiliary: Subcentimeter hyperdensity in the dependent aspect of the nondilated gallbladder suggesting small gallstone. No focal liver lesion or biliary ductal dilatation.  Pancreas: Unremarkable. No pancreatic ductal dilatation or surrounding inflammatory changes. Spleen: Normal in size without focal abnormality. Adrenals/Urinary Tract: Adrenal glands are unremarkable. Kidneys are normal, without renal calculi, focal lesion, or hydronephrosis. Bladder is unremarkable. Stomach/Bowel: Stomach is incompletely distended, unremarkable. Small bowel decompressed. Right lower quadrant anastomotic staple line. Gas and fluid partially distend the colon, which is unremarkable. Vascular/Lymphatic: Scattered calcified plaque in the distal abdominal aorta without aneurysm or stenosis. Portal vein patent. No abdominal or pelvic adenopathy evident. Reproductive: Prostate is unremarkable. Other: Left pelvic phleboliths. No definite ascites. No free air. Resolution of left sub diaphragmatic fluid collection, with pigtail catheter stable in position. No new or undrained fluid collection. Musculoskeletal: No acute or significant osseous findings. IMPRESSION: 1. Interval resolution of left subdiaphragmatic fluid collection, with pigtail catheter stable in position. 2. No new or undrained fluid collection. 3.  Aortic Atherosclerosis (ICD10-I70.0). Electronically Signed   By: DLucrezia EuropeM.D.   On: 07/23/2022 14:38   CT ABDOMEN PELVIS W CONTRAST  Result Date: 07/10/2022 CLINICAL DATA:  Intra-abdominal abscess. Sepsis. Small bowel perforation. EXAM: CT ABDOMEN AND PELVIS WITH CONTRAST TECHNIQUE: Multidetector CT imaging of the abdomen and pelvis was performed using the standard protocol following bolus administration of intravenous contrast. RADIATION DOSE REDUCTION: This exam was performed according to the departmental dose-optimization program which includes automated exposure control, adjustment of the mA and/or kV according to patient size and/or use of iterative reconstruction technique. CONTRAST:  1073mOMNIPAQUE IOHEXOL 300 MG/ML  SOLN COMPARISON:  07/03/2022 FINDINGS: Lower chest: LEFT pleural  effusion and LEFT basilar atelectasis. Heart size is normal. Hepatobiliary: No focal liver abnormality is seen. No radiopaque gallstones, biliary dilatation, or pericholecystic inflammatory changes. Small amount of gas identified anterior to the liver, consistent with free intraperitoneal air from recent surgery. Pancreas: Unremarkable. No pancreatic ductal dilatation or surrounding inflammatory changes. Spleen: Normal in size without focal abnormality. Adrenals/Urinary Tract: Adrenal glands are unremarkable. Kidneys are symmetric in size. No suspicious renal lesion. No hydronephrosis. Visualized courses of the ureters are unremarkable. The bladder and visualized portion of the urethra are normal. Stomach/Bowel: Stomach is normal in appearance. The small bowel loops are mildly dilated without evidence for obstruction. Bowel sutures are identified in the RIGHT mid abdomen. Air-fluid levels throughout the bowel loops. There has been improvement in small fluid collection in the RIGHT mid abdomen. No evidence for abscess. Vascular/Lymphatic: There is  minimal atherosclerotic calcification of the abdominal aorta. Reproductive: Prostate is unremarkable. Other: Interval placement of LEFT sub diaphragmatic catheter. A small crescentic fluid collection persists, measuring approximately 7 centimeters in length and 1.3 centimeters in width (image 66 of series 5). Cachectic. Diffuse body wall edema. Musculoskeletal: No acute or significant osseous findings. IMPRESSION: 1. Interval placement of LEFT sub diaphragmatic catheter. There has been improvement in this fluid collection with small residual crescentic collection, measuring 7 centimeters in length and 1.3 centimeters in width. 2. Small locules of free intraperitoneal air, consistent with recent surgery. 3. Mildly dilated small bowel loops without evidence for obstruction. 4. Air-fluid levels throughout the bowel loops, consistent with ileus. 5. LEFT pleural effusion and  LEFT basilar atelectasis. 6. Cachectic. Diffuse body wall edema. Electronically Signed   By: Nolon Nations M.D.   On: 07/10/2022 13:02   CT GUIDED PERITONEAL/RETROPERITONEAL FLUID DRAIN BY PERC CATH  Result Date: 07/03/2022 INDICATION: Left upper quadrant perisplenic/subdiaphragmatic abscess EXAM: CT DRAINAGE LEFT UPPER QUADRANT ABSCESS Date:  07/03/2022 07/03/2022 4:03 pm Radiologist:  M. Daryll Brod, MD Guidance:  CT FLUOROSCOPY: None. MEDICATIONS: 1% lidocaine local ANESTHESIA/SEDATION: Moderate (conscious) sedation was employed during this procedure. A total of Versed 4.0 mg and Fentanyl 100 mcg was administered intravenously. Moderate Sedation Time: 28 minutes. The patient's level of consciousness and vital signs were monitored continuously by radiology nursing throughout the procedure under my direct supervision. CONTRAST:  None. COMPLICATIONS: None immediate. PROCEDURE: Informed consent was obtained from the patient following explanation of the procedure, risks, benefits and alternatives. The patient understands, agrees and consents for the procedure. All questions were addressed. A time out was performed. Maximal barrier sterile technique utilized including caps, mask, sterile gowns, sterile gloves, large sterile drape, hand hygiene, and ChloraPrep. Previous imaging reviewed. Patient position left side up decubitus. Noncontrast localization CT performed. The left upper quadrant fluid collection about the spleen and beneath the diaphragm was localized and marked for a posterior intercostal approach. Under sterile conditions and local anesthesia, an 18 gauge introducer needle was advanced. Needle position confirmed with CT. Guidewire inserted followed by tract dilatation to insert a 10 [place] drain. Unfortunately,, the drain catheter passed beneath the spleen and medial to the fluid collection. This catheter was cut and removed. At a more anterior intercostal space, an 18 gauge introducer needle was  advanced into a large component of the fluid collection. Needle position confirmed with CT. Needle was directed cephalad to the subdiaphragmatic area. Guidewire inserted easily followed by tract dilatation insert a 10 French drain. Drain catheter position confirmed within the subdiaphragmatic abscess by CT. Syringe aspiration yielded 70 cc purulent fluid. Catheter secured with Prolene suture and a sterile dressing. Suction bulb connected. Sample sent for culture. Patient tolerated the procedure well. IMPRESSION: Successful CT-guided left upper quadrant abscess drain placement as above. Electronically Signed   By: Jerilynn Mages.  Shick M.D.   On: 07/03/2022 16:42   CT ABDOMEN PELVIS W CONTRAST  Result Date: 07/01/2022 CLINICAL DATA:  Acute abdominal pain, postop for small-bowel resection with small bowel perforation and feculent peritonitis. EXAM: CT ABDOMEN AND PELVIS WITH CONTRAST TECHNIQUE: Multidetector CT imaging of the abdomen and pelvis was performed using the standard protocol following bolus administration of intravenous contrast. RADIATION DOSE REDUCTION: This exam was performed according to the departmental dose-optimization program which includes automated exposure control, adjustment of the mA and/or kV according to patient size and/or use of iterative reconstruction technique. CONTRAST:  180m OMNIPAQUE IOHEXOL 300 MG/ML  SOLN COMPARISON:  May 20, 2022  FINDINGS: Lower chest: Moderate LEFT and small RIGHT pleural effusions. Hepatobiliary: Suggestion of background steatosis of the liver. Portal vein is patent. No pericholecystic stranding or sign of biliary duct dilation. Perihepatic fluid with some peritoneal enhancement w and small locules of gas/pneumoperitoneum. Fluid tracks along the anterior abdomen adjacent to small bowel loops, for instance on image 53/2 an area measuring 6 x 1.1 cm is noted. Loculated fluid in the LEFT subdiaphragmatic region, see below. Pancreas: Pancreas is normal. Spleen: Spleen  with compression of parenchyma due to loculated LEFT subdiaphragmatic fluid that tracks along the gastrosplenic ligament and beneath the LEFT hemidiaphragm. This fluid measures 8.5 x 4.3 cm (image 21/2) Adrenals/Urinary Tract: Adrenal glands are normal. Symmetric renal enhancement without hydronephrosis or perinephric stranding. Foley catheter decompresses the urinary bladder which is not well assessed. No suspicious renal lesion. Stomach/Bowel: Distortion of the stomach due to loculated LEFT subdiaphragmatic fluid adjacent to spleen and stomach. Contrast passes beyond the stomach into the small bowel becomes more dilute is it passes distally. Signs of small bowel resection in the RIGHT lower quadrant. Small bowel is dilated to this level. Colon becomes gradually decompressed along the descending colon and is moderately dilated as well and filled with gas. Small bowel dilation is intermittent. Contrast passes the level of the anastomotic site in the RIGHT lower quadrant becoming more dilute is it passes into the ileum. No discrete transition point is identified. There is a slight changing caliber just beyond the anastomosis but bowel loops show mild distension to the level of the terminal ileum. No definitive signs of leak.  No gas near the anastomotic site. No focal fluid near the anastomotic site. Vascular/Lymphatic: No aneurysmal dilation of the abdominal aorta. Calcified aortic atherosclerotic changes. No adenopathy in the abdomen or in the pelvis. Reproductive: Scrotal in generalized edema throughout the body wall likely reflecting anasarca or hypoproteinemia. Otherwise unremarkable to the extent evaluated by CT. Other: Loculated appearing fluid about the liver and in particular in the LEFT subdiaphragmatic space. Small locules of gas within this fluid in this recent postoperative patient. Peritoneal enhancement. Trace stranding and thickening along the midline surgical changes as well. Signs of fluid in the  pelvis, best seen on image 55/4 measuring approximately 4.8 x 1.0 cm in the coronal plane adjacent to small bowel loops in the pelvis Musculoskeletal: Body wall edema. Signs of cachexia. No acute bone finding or destructive bone process. IMPRESSION: 1. Signs of small bowel resection in the RIGHT lower quadrant. No definitive signs of leak at this time such is gas or extraluminal contrast adjacent to the small bowel anastomosis. 2. Findings more suggestive of ileus in the setting of peritonitis. Partial small bowel obstruction in the RIGHT lower quadrant is another differential consideration, follow-up will be helpful. 3. Loculated LEFT subdiaphragmatic fluid suspicious for abscess deforming the spleen. 4. Small volume pneumoperitoneum with small locules of gas within perihepatic fluid also suspicious for complicated postoperative fluid though without as well-defined margins but associated with peritoneal enhancement. 5. Small fluid in the pelvis adjacent to small bowel loops barely discernible in the axial plane seen best in the coronal plane in the LEFT hemipelvis. 6. Signs of anasarca with LEFT greater than RIGHT pleural effusions and body wall edema. Aortic Atherosclerosis (ICD10-I70.0). Electronically Signed   By: Zetta Bills M.D.   On: 07/01/2022 16:25   DG Abd 1 View  Result Date: 06/28/2022 CLINICAL DATA:  NG tube placement. EXAM: ABDOMEN - 1 VIEW COMPARISON:  CT, 05/20/2022. FINDINGS: Nasogastric tube passes  well below the diaphragm, tip in the mid stomach. Dilated small bowel loops are noted in the visualized central abdomen. There is opacity in the medial left upper lobe, that appears to of progressed when compared to the chest radiograph dated 06/25/2022. This may reflect atelectasis. IMPRESSION: 1. Well-positioned nasal/orogastric tube. 2. Dilated small bowel loops consistent with a partial small bowel obstruction. This could be further assessed with CT. 3. Increased opacity in the medial left  upper lobe suggestive of atelectasis. Electronically Signed   By: Lajean Manes M.D.   On: 06/28/2022 09:36   [place] EKG SITE RITE  Result Date: 06/26/2022 If Site Rite image not attached, placement could not be confirmed due to current cardiac rhythm.  Portable Chest x-ray  Result Date: 06/25/2022 CLINICAL DATA:  Intubated EXAM: PORTABLE CHEST 1 VIEW COMPARISON:  05/31/2022 FINDINGS: Endotracheal tube tip is about 5.6 cm superior to the carina. Esophageal tube tip below the diaphragm. Clear lung fields. Normal cardiac size. No pneumothorax. Scoliosis of the spine IMPRESSION: Endotracheal tube tip about 5.6 cm superior to carina. Clear lungs. Electronically Signed   By: Donavan Foil M.D.   On: 06/25/2022 19:57   CT Head Wo Contrast  Result Date: 06/24/2022 CLINICAL DATA:  Bilateral lower extremity weakness x2 months. EXAM: CT HEAD WITHOUT CONTRAST TECHNIQUE: Contiguous axial images were obtained from the base of the skull through the vertex without intravenous contrast. RADIATION DOSE REDUCTION: This exam was performed according to the departmental dose-optimization program which includes automated exposure control, adjustment of the mA and/or kV according to patient size and/or use of iterative reconstruction technique. COMPARISON:  None Available. FINDINGS: Brain: No evidence of acute infarction, hemorrhage, hydrocephalus, extra-axial collection or mass lesion/mass effect. Vascular: No hyperdense vessel or unexpected calcification. Skull: Normal. Negative for fracture or focal lesion. Sinuses/Orbits: No acute finding. Other: None. IMPRESSION: No acute intracranial pathology. Electronically Signed   By: Virgina Norfolk M.D.   On: 06/24/2022 19:44   MR Lumbar Spine W Wo Contrast  Result Date: 06/24/2022 CLINICAL DATA:  Provided history: Low back pain, symptoms persist with greater than 6 weeks treatment. Additional history provided by the scanning technologist: The patient reports back pain, bilateral  lower extremity numbness and decreased sensation (right greater than left). Right leg slightly weaker than left. EXAM: MRI LUMBAR SPINE WITHOUT AND WITH CONTRAST TECHNIQUE: Multiplanar and multiecho pulse sequences of the lumbar spine were obtained without and with intravenous contrast. CONTRAST:  37m GADAVIST GADOBUTROL 1 MMOL/ML IV SOLN COMPARISON:  Lumbar spine radiographs 03/23/2019. FINDINGS: Segmentation: 5 lumbar vertebrae. The caudal most well-formed interval disc space is designated L5-S1. Alignment: Levocurvature of the lumbar spine. No significant spondylolisthesis. Vertebrae: Vertebral body height is maintained. Mild degenerative endplate signal changes at L2-L3 and L3-L4. Conus medullaris and cauda equina: Conus extends to the L2 level. No signal abnormality identified within the visualized distal spinal cord. No pathologic enhancement of the visualized distal spinal cord or cauda equina nerve roots. Paraspinal and other soft tissues: No paraspinal mass or collection. No acute finding within included portions of the abdomen/retroperitoneum. Disc levels: Mild disc degeneration at L5-S1. T12-L1: No significant disc herniation or stenosis. L1-L2: No significant disc herniation or stenosis. L2-L3: No significant disc herniation or stenosis. L3-L4: Slight disc bulge. No significant spinal canal or foraminal stenosis. L4-L5: No significant disc herniation or stenosis. L5-S1: Small left subarticular disc protrusion (at site of posterior annular fissure) (for as seen on series 5, image 9) (series 8, image 37). The disc protrusion results in  slight left subarticular narrowing, contacting the descending left S1 nerve root. No significant central canal stenosis or neural foraminal narrowing. IMPRESSION: Lumbar spondylosis, as outlined and with findings most notably as follows. At L5-S1, there is mild disc degeneration. Small left subarticular disc protrusion (at site of posterior annular fissure). The disc  protrusion results in slight left subarticular narrowing, and contacts the descending left S1 nerve root. No significant central canal stenosis. Levocurvature of the lumbar spine. Electronically Signed   By: Kellie Simmering D.O.   On: 06/24/2022 19:07   US Abdomen Limited RUQ (LIVER/GB)  Result Date: 06/24/2022 CLINICAL DATA:  Right upper quadrant abdominal pain. EXAM: ULTRASOUND ABDOMEN LIMITED RIGHT UPPER QUADRANT COMPARISON:  June 03, 2022. FINDINGS: Gallbladder: Sludge and probable cholelithiasis is noted within the gallbladder lumen. No gallbladder wall thickening or pericholecystic fluid is noted. No sonographic Murphy's sign is noted. Common bile duct: Diameter: 3 mm which is within normal limits. Liver: No focal lesion identified. Within normal limits in parenchymal echogenicity. Portal vein is patent on color Doppler imaging with normal direction of blood flow towards the liver. Other: None. IMPRESSION: Cholelithiasis and gallbladder sludge is noted without evidence of cholecystitis. Electronically Signed   By: Marijo Conception M.D.   On: 06/24/2022 15:11    Labs:  CBC: Recent Labs    07/09/22 0113 07/10/22 0233 07/11/22 0447 07/12/22 0504  WBC 12.5* 10.3 10.0 8.5  HGB 7.6* 7.3* 7.5* 7.3*  HCT 22.6* 21.7* 22.5* 22.2*  PLT 1,146* 1,025* 982* 914*    COAGS: Recent Labs    07/03/22 0334 07/08/22 0447  INR 1.1 1.2  APTT  --  34    BMP: Recent Labs    07/09/22 0113 07/10/22 0233 07/11/22 0447 07/12/22 0504  NA 134* 129* 133* 133*  K 4.0 4.0 5.0 3.7  CL 98 97* 101 98  CO2 '27 25 25 25  '$ GLUCOSE 100* 96 295* 83  BUN '11 9 10 10  '$ CALCIUM 8.7* 8.4* 8.6* 8.8*  CREATININE 0.53* 0.60* 0.55* 0.57*  GFRNONAA >60 >60 >60 >60    LIVER FUNCTION TESTS: Recent Labs    07/08/22 0447 07/09/22 0113 07/10/22 0233 07/12/22 0504  BILITOT 0.5 0.6 0.4 0.4  AST '15 17 15 16  '$ ALT '19 20 17 19  '$ ALKPHOS 153* 147* 110 95  PROT 7.0 7.3 6.6 7.2  ALBUMIN 2.4* 2.7* 2.4* 3.1*    TUMOR  MARKERS: No results for input(s): "AFPTM", "CEA", "CA199", "CHROMGRNA" in the last 8760 hours.  Assessment and Plan:  Discussed case with Dr. Vernard Gambles who recommends drain removal at this time. Left upper quadrant perisplenic/subdiaphragmatic abscess drain removed intact with no complications. Patient tolerated procedure well. Dressing applied to exit site.   Post-removal instructions: - Okay  to shower/sponge bath 24 hours post-removal. - No submerging (swimming, bathing) for 7 days post-removal. - Keep the dressing/bandage on to take shower, take dressing/bandage off and pat dry  the area completely before placing a new dressing/bandage  - Look for signs and symptoms of infection such as reddening of skin, pus like drainage,     fever and/or chills - Change dressing PRN until site fully healed.     Patient verbalized understanding.    Thank you for this interesting consult.  I greatly enjoyed meeting Denvil Dorrell Culbertson and look forward to participating in their care.  A copy of this report was sent to the requesting provider on this date.  Electronically Signed: Tyson Alias 07/23/2022, 2:42 PM  I spent a total of 20 minutes in face to face in clinical consultation, greater than 50% of which was counseling/coordinating care for left upper quadrant perisplenic/subdiaphragmatic abscess.

## 2022-07-23 NOTE — Telephone Encounter (Signed)
Gabriel Dalton [facility]) called to report an 8/10 pain level localized around the stomach area. Pt did have surgery around this are and also did not take his pain meds. Pt is going to see the surgeon today (3.13.24).

## 2022-07-28 ENCOUNTER — Telehealth: Payer: Self-pay | Admitting: Family

## 2022-07-28 NOTE — Telephone Encounter (Signed)
Caller/Agency: [place] Number: [phone] Requesting OT/PT/Skilled Nursing/Social Work/Speech Therapy: nursing Frequency: Plan of care orders 1 w 1, 2 w 2, 1 w 3 , 2 as needed.ver

## 2022-07-29 ENCOUNTER — Telehealth: Payer: Self-pay | Admitting: Family

## 2022-07-29 ENCOUNTER — Other Ambulatory Visit (HOSPITAL_COMMUNITY): Payer: Self-pay

## 2022-07-29 NOTE — Telephone Encounter (Signed)
Spoke with Gabriel Dalton and advised that we have not seen patient in a while and will need to be seen.  Advised that he is seeing new provider tomorrow and for wound care they can get in contact with surgeon.  Also we did not order HH.

## 2022-07-29 NOTE — Telephone Encounter (Signed)
Caller/Agency: [facility]  [place] Number: [phone] Requesting OT/PT/Skilled Nursing/Social Work/Speech Therapy: Skilled nursing  -Pt has abdominal wound and 2 unstageable ulcers at the sacrum.  -Cleanse abdominal wound with saline, apply wet to dry, teach same to caregiver for sacral wound.  -To sacral wound-metahoney and foam dressing to be changed every 7 days or as needed for soiling.  Frequency: 1x1, 2x2, 1x3  Order faxed over as well.

## 2022-07-30 ENCOUNTER — Ambulatory Visit (INDEPENDENT_AMBULATORY_CARE_PROVIDER_SITE_OTHER): Payer: 59 | Admitting: Student

## 2022-07-30 ENCOUNTER — Encounter: Payer: Self-pay | Admitting: Student

## 2022-07-30 ENCOUNTER — Other Ambulatory Visit (HOSPITAL_COMMUNITY): Payer: Self-pay

## 2022-07-30 VITALS — BP 107/73 | HR 109 | Temp 97.9°F | Ht 68.0 in | Wt 101.7 lb

## 2022-07-30 DIAGNOSIS — E878 Other disorders of electrolyte and fluid balance, not elsewhere classified: Secondary | ICD-10-CM

## 2022-07-30 DIAGNOSIS — K659 Peritonitis, unspecified: Secondary | ICD-10-CM

## 2022-07-30 DIAGNOSIS — K631 Perforation of intestine (nontraumatic): Secondary | ICD-10-CM | POA: Diagnosis not present

## 2022-07-30 DIAGNOSIS — R1084 Generalized abdominal pain: Secondary | ICD-10-CM

## 2022-07-30 DIAGNOSIS — K219 Gastro-esophageal reflux disease without esophagitis: Secondary | ICD-10-CM

## 2022-07-30 DIAGNOSIS — E43 Unspecified severe protein-calorie malnutrition: Secondary | ICD-10-CM

## 2022-07-30 DIAGNOSIS — R933 Abnormal findings on diagnostic imaging of other parts of digestive tract: Secondary | ICD-10-CM

## 2022-07-30 DIAGNOSIS — E519 Thiamine deficiency, unspecified: Secondary | ICD-10-CM

## 2022-07-30 DIAGNOSIS — K8020 Calculus of gallbladder without cholecystitis without obstruction: Secondary | ICD-10-CM

## 2022-07-30 DIAGNOSIS — E559 Vitamin D deficiency, unspecified: Secondary | ICD-10-CM

## 2022-07-30 DIAGNOSIS — N400 Enlarged prostate without lower urinary tract symptoms: Secondary | ICD-10-CM

## 2022-07-30 MED ORDER — THIAMINE HCL 100 MG PO TABS: 100.0000 mg | ORAL_TABLET | Freq: Every day | ORAL | 1 refills | Status: DC

## 2022-07-30 MED ORDER — METHOCARBAMOL 500 MG PO TABS: 500.0000 mg | ORAL_TABLET | Freq: Three times a day (TID) | ORAL | 1 refills | Status: DC | PRN

## 2022-07-30 NOTE — Progress Notes (Signed)
CC: Establish care  HPI:  [name] is a 37 y.o. male with PMH as below who presents to clinic accompanied by his spouse to establish care. Please see problem based charting for evaluation, assessment and plan.  Past Medical History:  Diagnosis Date   BPH (benign prostatic hyperplasia)    Bronchitis    Multiple gastric ulcers    Family history: Father with a history of BPH and hypertension. Mother with a history of sciatica. Maternal grandmother had a history of breast cancer. One maternal uncle passed away from colon cancer and a second maternal uncle has colon cancer.  Social history: Lives in [place] with his wife.  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 Has smoked 1/2ppd of cigarettes for about 15 years. Denies EtOH use. Smokes marijuana 1-2 times daily.   Review of Systems:  Constitutional: Positive for weight loss and generalized weakness.  Eyes: Negative for visual changes Respiratory: Negative for shortness of breath Cardiac: Negative for chest pain MSK: Negative for back pain Abdomen: Positive for abdominal pain, spasm and bloating. Negative for nausea, vomiting, constipation or diarrhea Neuro: Negative for headache or weakness. Positive for trouble walking and occasional numbness and tingling in the lower extremities.  Physical Exam: General: Pleasant, malnourished and cachectic appearing middle-aged man.  No acute distress. Cardiac: Tachycardic. Regular rhythm. No murmurs, rubs or gallops. No LE edema Respiratory: Lungs CTAB. No wheezing or crackles. Abdominal: Loss of abdominal fat. Surgical midline incision covered with dressing, c/d/l. Hypoactive bowel sounds. Skin: Warm, dry and intact without rashes or lesions. Muscle wasting in the face.  Extremities: Atraumatic. Full ROM. Palpable radial and DP pulses. Neuro: A&O x 3. Moves all extremities. Ataxic gait. Normal sensation to gross touch. Psych: Appropriate mood and  affect.  Vitals:   07/30/22 1429  BP: 107/73  Pulse: (!) 109  Temp: 97.9 F (36.6 C)  TempSrc: Oral  SpO2: 100%  Weight: 101 lb 11.2 oz (46.1 kg)  Height: 5\' 8"  (1.727 m)    Assessment & Plan:   Small bowel perforation [facility]) Patient here with his wife to establish care in our clinic.  He has been followed by [facility] for the last 3-4 years for generalized abdominal pain, nausea and vomiting. He has undergone extensive workup since October 2019 with multiple CT abdomen/pelvis (7 from 02/2018-05/2022), 1 MR abdomen/pelvics, gastric emptying study, 2 EGDs and 2 colonoscopies. All without significant findings to explain his persistent abdominal pain, bloating, and N/V.  His persistent GI symptoms has led to obesity pounds of weight loss since 2020.  His last CT scan in January showed questionable sludge and cholelithiasis.  Patient was referred to general surgery and they offered laparoscopic cholecystectomy with intraoperative cholangiography and diagnostic laparoscopy.  He presented to the ED on 06/14/2022 for worsening abdominal pain and was admitted for cholecystectomy. During the surgery on 2/14, patient was found to have 2 small areas of bowel perforation with feculent peritonitis throughout the abdomen so the case was converted to open.  Small bowel resection was performed and a washout with abdominal closure was performed 2 days later. One week after surgery, CT scan reviewed loculated left-sided diaphragmatic fluid collection suspicious for abscess near the spleen. IR was consulted for percutaneous drain placement which was placed on 2/22.  Patient required brief ICU stay and TPN for nutrition during his hospitalization. ID was consulted to assist with antibiotics management.  Repeat imaging showed improvement in the fluid collection and patient was discharged home on  07/12/22 after a 3-week hospitalization. He presents today to establish care in our clinic. His last CT abdomen pelvis on 07/23/2022  showed interval resolution of the left subdiaphragmatic fluid collection. He has already followed up with IR with removal of the drain 1 week ago. Patient was evaluated by surgery yesterday was found to be making appropriate progress with plan to follow-up in 1 month. Patient reports that his GI symptoms have improved so he is not able to keep food down. He has been eating 6 small meals throughout the day and Ensure in between his meals. He denies any recent nausea vomiting and states he is starting to regain his appetite. His weight is 101 pounds today. Patient and spouse would like to switch to [facility] for further management of his GI problems.  Plan: -Referral to [facility] -Continue abdominal wound care -Continue Protonix, Percocet, Robaxin, dicyclomine and simethicone -Continue nutritional and vitamin supplementation -Follow-up with infectious disease as scheduled on 09/02/2022 -Follow-up with general surgeon as scheduled on 09/09/2022  Cholelithiasis RUQ U/S on 06/24/2022 confirmed cholelithiasis and gallbladder sludge without evidence of cholecystitis. Patient followed by general surgery with plan for possible cholecystectomy once patient has recovered from his small bowel perforation and prolonged hospitalization. -Follow-up with general surgery  Benign prostatic hyperplasia A few years ago, patient reports he was evaluated by urologist due to rectal fullness was found to have an enlarged prostate. He was started on Flomax with improvement in his symptoms. Per wife, patient was supposed to follow-up with the urologist right before he was hospitalized. His most recent CT scans have shown unremarkable prostate. Patient denies any urinary symptoms at the moment. He also denies any family history of prostate cancer. I advised him to call the urologist for a follow-up appointment.  Plan: -Continue Flomax 0.4 mg daily -Follow-up with urology  Normocytic anemia Patient developed anemia secondary  to blood loss from abdominal surgery during recent hospitalization with hemoglobin as low as 7.3. Repeat CBC today shows hemoglobin improved to 11.2. -CTM  Severe protein-calorie malnutrition ([place]) Patient reports losing over 60 pounds over the last 3 years. Patient with worsening deconditioning after recent prolonged hospitalization. Since discharge, he has been increasing his nutrition, eating 6 small meals throughout the day and supplementing with Ensure 3 times daily between meals. He feels that he is regaining his appetite slowly. Current weight is 101 lbs with a BMI of 15.46. CMP today shows improvement in his albumin to 4.0. I advised patient and his wife to increase his caloric intake as well as his protein intake with close monitoring of his weight.  -Continue nutritional supplementation -Continue vitamin supplementation  Electrolyte abnormality Patient was found to have multiple electrolyte derangements including hyponatremia, hypokalemia, hypophosphatemia and hypomagnesemia and during his recent hospitalization. Likely secondary to refeeding syndrome. Repeat labs today shows normal magnesium, phosphate, potassium, calcium and sodium.  Gastroesophageal reflux disease Continue Protonix 40 mg daily  Vitamin D deficiency Vitamin D levels on 06/24/2022 during his recent hospitalization significantly low at 9.0. -Start vitamin D 50,000 units weekly for 8 weeks  Thiamine deficiency Patient found to have low vitamin B1 of 49.5 during last hospitalization. Patient reports some numbness in his lower extremities as well as gait instabilities. He denies any alcohol use but his vitamin deficiency likely secondary to poor absorption. -Start thiamine 100 mg daily   See Encounters Tab for problem based charting.  Patient discussed with Dr.  Thomes Cake, MD, MPH

## 2022-07-30 NOTE — Patient Instructions (Signed)
Thank you, Gabriel Dalton for allowing [place] to provide your care today. Today we establish you in our clinic and discussed your recent hospitalization.  I am glad that you have started to turn a corner.  Continue taking your medications and follow-up with surgery and ID as scheduled.  I have ordered the following labs for you:  Lab Orders         CMP14 + Anion Gap         CBC no Diff         Magnesium         Phosphorus       I will call if any are abnormal. All of your labs can be accessed through "My Chart".  I have place a referrals to GI   My Chart Access: [email or web address]  Please follow-up in 4-6 weeks  Please make sure to arrive 15 minutes prior to your next appointment. If you arrive late, you may be asked to reschedule.    We look forward to seeing you next time. Please call our clinic at [phone] if you have any questions or concerns. The best time to call is Monday-Friday from 9am-4pm, but there is someone available 24/7. If after hours or the weekend, call the main hospital number and ask for the Internal Medicine Resident On-Call. If you need medication refills, please notify your pharmacy one week in advance and they will send [place] a request.   Thank you for letting us take part in your care. Wishing you the best!  Lacinda Axon, MD 07/30/2022, 3:32 PM IM Resident, PGY-3 [facility]al[facility] Milian 41:10

## 2022-07-31 LAB — MAGNESIUM: Magnesium: 1.8 mg/dL (ref 1.6–2.3)

## 2022-07-31 LAB — CMP14 + ANION GAP
ALT: 14 IU/L (ref 0–44)
AST: 9 IU/L (ref 0–40)
Albumin/Globulin Ratio: 1.4 (ref 1.2–2.2)
Albumin: 4 g/dL — ABNORMAL LOW (ref 4.1–5.1)
Alkaline Phosphatase: 113 IU/L (ref 44–121)
Anion Gap: 16 mmol/L (ref 10.0–18.0)
BUN/Creatinine Ratio: 20 (ref 9–20)
BUN: 11 mg/dL (ref 6–20)
Bilirubin Total: 0.2 mg/dL (ref 0.0–1.2)
CO2: 24 mmol/L (ref 20–29)
Calcium: 9.4 mg/dL (ref 8.7–10.2)
Chloride: 96 mmol/L (ref 96–106)
Creatinine, Ser: 0.55 mg/dL — ABNORMAL LOW (ref 0.76–1.27)
Globulin, Total: 2.9 g/dL (ref 1.5–4.5)
Glucose: 105 mg/dL — ABNORMAL HIGH (ref 70–99)
Potassium: 4 mmol/L (ref 3.5–5.2)
Sodium: 136 mmol/L (ref 134–144)
Total Protein: 6.9 g/dL (ref 6.0–8.5)
eGFR: 133 mL/min/{1.73_m2} (ref >59)

## 2022-07-31 LAB — CBC
Hematocrit: 35.2 % — ABNORMAL LOW (ref 37.5–51.0)
Hemoglobin: 11.2 g/dL — ABNORMAL LOW (ref 13.0–17.7)
MCH: 29.2 pg (ref 26.6–33.0)
MCHC: 31.8 g/dL (ref 31.5–35.7)
MCV: 92 fL (ref 79–97)
Platelets: 519 10*3/uL — ABNORMAL HIGH (ref 150–450)
RBC: 3.84 x10E6/uL — ABNORMAL LOW (ref 4.14–5.80)
RDW: 16.6 % — ABNORMAL HIGH (ref 11.6–15.4)
WBC: 7.3 10*3/uL (ref 3.4–10.8)

## 2022-07-31 LAB — PHOSPHORUS: Phosphorus: 3.5 mg/dL (ref 2.8–4.1)

## 2022-08-01 ENCOUNTER — Encounter: Payer: Self-pay | Admitting: Student

## 2022-08-01 DIAGNOSIS — E519 Thiamine deficiency, unspecified: Secondary | ICD-10-CM | POA: Insufficient documentation

## 2022-08-01 DIAGNOSIS — E559 Vitamin D deficiency, unspecified: Secondary | ICD-10-CM | POA: Insufficient documentation

## 2022-08-01 MED ORDER — VITAMIN D (ERGOCALCIFEROL) 1.25 MG (50000 UNIT) PO CAPS: 50000.0000 [IU] | ORAL_CAPSULE | ORAL | 0 refills | Status: DC

## 2022-08-01 NOTE — Assessment & Plan Note (Addendum)
Patient reports losing over 60 pounds over the last 3 years. Patient with worsening deconditioning after recent prolonged hospitalization. Since discharge, he has been increasing his nutrition, eating 6 small meals throughout the day and supplementing with Ensure 3 times daily between meals. He feels that he is regaining his appetite slowly. Current weight is 101 lbs with a BMI of 15.46. CMP today shows improvement in his albumin to 4.0. I advised patient and his wife to increase his caloric intake as well as his protein intake with close monitoring of his weight.  -Continue nutritional supplementation -Continue vitamin supplementation

## 2022-08-01 NOTE — Assessment & Plan Note (Signed)
Vitamin D levels on 06/24/2022 during his recent hospitalization significantly low at 9.0. -Start vitamin D 50,000 units weekly for 8 weeks

## 2022-08-01 NOTE — Assessment & Plan Note (Signed)
Continue Protonix 40 mg daily

## 2022-08-01 NOTE — Assessment & Plan Note (Signed)
Patient was found to have multiple electrolyte derangements including hyponatremia, hypokalemia, hypophosphatemia and hypomagnesemia and during his recent hospitalization. Likely secondary to refeeding syndrome. Repeat labs today shows normal magnesium, phosphate, potassium, calcium and sodium.

## 2022-08-01 NOTE — Assessment & Plan Note (Signed)
Patient developed anemia secondary to blood loss from abdominal surgery during recent hospitalization with hemoglobin as low as 7.3. Repeat CBC today shows hemoglobin improved to 11.2. -CTM

## 2022-08-01 NOTE — Assessment & Plan Note (Addendum)
Patient here with his wife to establish care in our clinic.  He has been followed by [facility] for the last 3-4 years for generalized abdominal pain, nausea and vomiting. He has undergone extensive workup since October 2019 with multiple CT abdomen/pelvis (7 from 02/2018-05/2022), 1 MR abdomen/pelvics, gastric emptying study, 2 EGDs and 2 colonoscopies. All without significant findings to explain his persistent abdominal pain, bloating, and N/V.  His persistent GI symptoms has led to obesity pounds of weight loss since 2020.  His last CT scan in January showed questionable sludge and cholelithiasis.  Patient was referred to general surgery and they offered laparoscopic cholecystectomy with intraoperative cholangiography and diagnostic laparoscopy.  He presented to the ED on 06/14/2022 for worsening abdominal pain and was admitted for cholecystectomy. During the surgery on 2/14, patient was found to have 2 small areas of bowel perforation with feculent peritonitis throughout the abdomen so the case was converted to open.  Small bowel resection was performed and a washout with abdominal closure was performed 2 days later. One week after surgery, CT scan reviewed loculated left-sided diaphragmatic fluid collection suspicious for abscess near the spleen. IR was consulted for percutaneous drain placement which was placed on 2/22.  Patient required brief ICU stay and TPN for nutrition during his hospitalization. ID was consulted to assist with antibiotics management.  Repeat imaging showed improvement in the fluid collection and patient was discharged home on 07/12/22 after a 3-week hospitalization. He presents today to establish care in our clinic. His last CT abdomen pelvis on 07/23/2022 showed interval resolution of the left subdiaphragmatic fluid collection. He has already followed up with IR with removal of the drain 1 week ago. Patient was evaluated by surgery yesterday was found to be making appropriate progress with  plan to follow-up in 1 month. Patient reports that his GI symptoms have improved so he is not able to keep food down. He has been eating 6 small meals throughout the day and Ensure in between his meals. He denies any recent nausea vomiting and states he is starting to regain his appetite. His weight is 101 pounds today. Patient and spouse would like to switch to [facility] for further management of his GI problems.  Plan: -Referral to [facility] -Continue abdominal wound care -Continue Protonix, Percocet, Robaxin, dicyclomine and simethicone -Continue nutritional and vitamin supplementation -Follow-up with infectious disease as scheduled on 09/02/2022 -Follow-up with general surgeon as scheduled on 09/09/2022

## 2022-08-01 NOTE — Assessment & Plan Note (Signed)
Patient found to have low vitamin B1 of 49.5 during last hospitalization. Patient reports some numbness in his lower extremities as well as gait instabilities. He denies any alcohol use but his vitamin deficiency likely secondary to poor absorption. -Start thiamine 100 mg daily

## 2022-08-01 NOTE — Assessment & Plan Note (Addendum)
A few years ago, patient reports he was evaluated by urologist due to rectal fullness was found to have an enlarged prostate. He was started on Flomax with improvement in his symptoms. Per wife, patient was supposed to follow-up with the urologist right before he was hospitalized. His most recent CT scans have shown unremarkable prostate. Patient denies any urinary symptoms at the moment. He also denies any family history of prostate cancer. I advised him to call the urologist for a follow-up appointment.  Plan: -Continue Flomax 0.4 mg daily -Follow-up with urology

## 2022-08-01 NOTE — Assessment & Plan Note (Addendum)
RUQ U/S on 06/24/2022 confirmed cholelithiasis and gallbladder sludge without evidence of cholecystitis. Patient followed by general surgery with plan for possible cholecystectomy once patient has recovered from his small bowel perforation and prolonged hospitalization. -Follow-up with general surgery

## 2022-08-02 NOTE — Progress Notes (Signed)
Kidney function remains normal, electrolytes are within normal limits, hemoglobin and platelets improved and albumin up to 4.0. Called and discussed results with patient.  I also informed him to pick up the prescription for the vitamin D supplementation.

## 2022-08-12 NOTE — Progress Notes (Signed)
Internal Medicine Clinic Attending  Case discussed with Dr. Amponsah  At the time of the visit.  We reviewed the resident's history and exam and pertinent patient test results.  I agree with the assessment, diagnosis, and plan of care documented in the resident's note.  

## 2022-08-18 ENCOUNTER — Encounter (HOSPITAL_COMMUNITY): Payer: Self-pay

## 2022-08-18 ENCOUNTER — Other Ambulatory Visit: Payer: Self-pay

## 2022-08-18 ENCOUNTER — Emergency Department (HOSPITAL_COMMUNITY): Payer: 59

## 2022-08-18 ENCOUNTER — Emergency Department (HOSPITAL_COMMUNITY)
Admission: EM | Admit: 2022-08-18 | Discharge: 2022-08-18 | Disposition: A | Discharge status: Home / Self Care | Payer: 59 | Attending: Emergency Medicine | Admitting: Emergency Medicine

## 2022-08-18 DIAGNOSIS — R109 Unspecified abdominal pain: Secondary | ICD-10-CM | POA: Diagnosis present

## 2022-08-18 DIAGNOSIS — R112 Nausea with vomiting, unspecified: Secondary | ICD-10-CM | POA: Diagnosis not present

## 2022-08-18 LAB — CBC WITH DIFFERENTIAL/PLATELET
Abs Immature Granulocytes: 0.01 10*3/uL (ref 0.00–0.07)
Basophils Absolute: 0 10*3/uL (ref 0.0–0.1)
Basophils Relative: 0 %
Eosinophils Absolute: 0.1 10*3/uL (ref 0.0–0.5)
Eosinophils Relative: 1 %
HCT: 38.8 % — ABNORMAL LOW (ref 39.0–52.0)
Hemoglobin: 12.7 g/dL — ABNORMAL LOW (ref 13.0–17.0)
Immature Granulocytes: 0 %
Lymphocytes Relative: 30 %
Lymphs Abs: 1.7 10*3/uL (ref 0.7–4.0)
MCH: 29.7 pg (ref 26.0–34.0)
MCHC: 32.7 g/dL (ref 30.0–36.0)
MCV: 90.9 fL (ref 80.0–100.0)
Monocytes Absolute: 0.4 10*3/uL (ref 0.1–1.0)
Monocytes Relative: 8 %
Neutro Abs: 3.3 10*3/uL (ref 1.7–7.7)
Neutrophils Relative %: 61 %
Platelets: 458 10*3/uL — ABNORMAL HIGH (ref 150–400)
RBC: 4.27 MIL/uL (ref 4.22–5.81)
RDW: 17 % — ABNORMAL HIGH (ref 11.5–15.5)
WBC: 5.5 10*3/uL (ref 4.0–10.5)
nRBC: 0 % (ref 0.0–0.2)

## 2022-08-18 LAB — COMPREHENSIVE METABOLIC PANEL
ALT: 12 U/L (ref 0–44)
AST: 13 U/L — ABNORMAL LOW (ref 15–41)
Albumin: 4.1 g/dL (ref 3.5–5.0)
Alkaline Phosphatase: 77 U/L (ref 38–126)
Anion gap: 9 (ref 5–15)
BUN: 14 mg/dL (ref 6–20)
CO2: 28 mmol/L (ref 22–32)
Calcium: 9.5 mg/dL (ref 8.9–10.3)
Chloride: 98 mmol/L (ref 98–111)
Creatinine, Ser: 0.66 mg/dL (ref 0.61–1.24)
GFR, Estimated: >60 mL/min (ref >60)
Glucose, Bld: 98 mg/dL (ref 70–99)
Potassium: 4.2 mmol/L (ref 3.5–5.1)
Sodium: 135 mmol/L (ref 135–145)
Total Bilirubin: 0.7 mg/dL (ref 0.3–1.2)
Total Protein: 8 g/dL (ref 6.5–8.1)

## 2022-08-18 LAB — LIPASE, BLOOD: Lipase: 37 U/L (ref 11–51)

## 2022-08-18 MED ORDER — IOHEXOL 9 MG/ML PO SOLN
1000.0000 mL | ORAL | Status: AC
  Administered 2022-08-18: 1000 mL via ORAL

## 2022-08-18 MED ORDER — LACTATED RINGERS IV SOLN: INTRAVENOUS | Status: DC

## 2022-08-18 MED ORDER — IOHEXOL 9 MG/ML PO SOLN
ORAL | Status: AC
  Filled 2022-08-18: qty 1000

## 2022-08-18 MED ORDER — IOHEXOL 300 MG/ML  SOLN
80.0000 mL | Freq: Once | INTRAMUSCULAR | Status: AC | PRN
  Administered 2022-08-18: 80 mL via INTRAVENOUS

## 2022-08-18 MED ORDER — LACTATED RINGERS IV BOLUS
1000.0000 mL | Freq: Once | INTRAVENOUS | Status: AC
  Administered 2022-08-18: 1000 mL via INTRAVENOUS

## 2022-08-18 MED ORDER — HYDROMORPHONE HCL 1 MG/ML IJ SOLN
0.5000 mg | Freq: Once | INTRAMUSCULAR | Status: AC
  Administered 2022-08-18: 0.5 mg via INTRAVENOUS
  Filled 2022-08-18: qty 1

## 2022-08-18 NOTE — ED Triage Notes (Signed)
C/o abd pain, n/v and bloating after eating.  Pt reports LBM was this am but was hard and 4 days before this am.  Pt reports on percocet for recent abd surgery on 2/14.

## 2022-08-18 NOTE — ED Provider Notes (Signed)
[place] EMERGENCY DEPARTMENT AT [facility] Provider Note   CSN: [id number] Arrival date & time: 08/18/22  1779     History  Chief Complaint  Patient presents with   Abdominal Pain   Emesis   Bloated    Jovonni Dorrell Housand is a 37 y.o. male.  37 year old male presents with recurrent diffuse abdominal discomfort.  He has had bloating with some loose stools.  No fever or chills.  Symptoms get worse when he eats.  Has had a complicated history of abdominal surgery due to perforation with partial bowel resection.  Is concerned about reaccumulation of abdominal fluid.  Has not been seen by GI recently       Home Medications Prior to Admission medications   Medication Sig Start Date End Date Taking? Authorizing Provider  acetaminophen (TYLENOL) 500 MG tablet Take 2 tablets (1,000 mg total) by mouth every 6 (six) hours. 07/11/22   Adam Phenix, PA-C  dicyclomine (BENTYL) 20 MG tablet Take 1 tablet (20 mg total) by mouth 2 (two) times daily. Patient taking differently: Take 20 mg by mouth daily as needed for spasms. 05/28/22   Zehr, Shanda Bumps D, PA-C  feeding supplement (ENSURE ENLIVE / ENSURE PLUS) LIQD Take 237 mLs by mouth 3 (three) times daily between meals. 07/11/22   Rodolph Bong, MD  folic acid (FOLVITE) 1 MG tablet Take 1 tablet (1 mg total) by mouth daily. 07/12/22   Rodolph Bong, MD  leptospermum manuka honey (MEDIHONEY) PSTE paste Apply 1 Application topically daily. 07/22/22   Judyann Munson, MD  methocarbamol (ROBAXIN) 500 MG tablet Take 1 tablet (500 mg total) by mouth every 8 (eight) hours as needed for muscle spasms. 07/30/22   Steffanie Rainwater, MD  ondansetron (ZOFRAN) 4 MG tablet Take 1 tablet (4 mg total) by mouth every 8 (eight) hours as needed for nausea or vomiting. Patient not taking: Reported on 07/22/2022 07/11/22   Rodolph Bong, MD  pantoprazole (PROTONIX) 40 MG tablet Take 1 tablet (40 mg total) by mouth daily. 07/11/22    Rodolph Bong, MD  simethicone (MYLICON) 80 MG chewable tablet Chew 1 tablet (80 mg total) by mouth 4 (four) times daily as needed for flatulence. 07/11/22   Rodolph Bong, MD  tamsulosin (FLOMAX) 0.4 MG CAPS capsule Take 0.4 mg by mouth daily. Patient not taking: Reported on 07/22/2022 06/20/22   [provider]  thiamine (VITAMIN B1) 100 MG tablet Take 1 tablet (100 mg total) by mouth daily. 07/30/22   Steffanie Rainwater, MD  Vitamin D, Ergocalciferol, (DRISDOL) 1.25 MG (50000 UNIT) CAPS capsule Take 1 capsule (50,000 Units total) by mouth every 7 (seven) days. 08/01/22   Steffanie Rainwater, MD      Allergies    Aspirin    Review of Systems   Review of Systems  All other systems reviewed and are negative.   Physical Exam Updated Vital Signs BP (!) 130/101 (BP Location: Right Arm)   Pulse 85   Temp 97.9 F (36.6 C)   Resp 19   Ht 1.727 m (5\' 8" )   SpO2 98%   BMI 15.46 kg/m  Physical Exam Vitals and nursing note reviewed.  Constitutional:      General: He is not in acute distress.    Appearance: Normal appearance. He is well-developed. He is not toxic-appearing.  HENT:     Head: Normocephalic and atraumatic.  Eyes:     General: Lids are normal.  Conjunctiva/sclera: Conjunctivae normal.     Pupils: Pupils are equal, round, and reactive to light.  Neck:     Thyroid: No thyroid mass.     Trachea: No tracheal deviation.  Cardiovascular:     Rate and Rhythm: Normal rate and regular rhythm.     Heart sounds: Normal heart sounds. No murmur heard.    No gallop.  Pulmonary:     Effort: Pulmonary effort is normal. No respiratory distress.     Breath sounds: Normal breath sounds. No stridor. No decreased breath sounds, wheezing, rhonchi or rales.  Abdominal:     General: There is no distension.     Palpations: Abdomen is soft.     Tenderness: There is no abdominal tenderness. There is no rebound.  Musculoskeletal:        General: No tenderness. Normal range  of motion.     Cervical back: Normal range of motion and neck supple.  Skin:    General: Skin is warm and dry.     Findings: No abrasion or rash.  Neurological:     Mental Status: He is alert and oriented to person, place, and time. Mental status is at baseline.     GCS: GCS eye subscore is 4. GCS verbal subscore is 5. GCS motor subscore is 6.     Cranial Nerves: No cranial nerve deficit.     Sensory: No sensory deficit.     Motor: Motor function is intact.  Psychiatric:        Attention and Perception: Attention normal.        Speech: Speech normal.        Behavior: Behavior normal.     ED Results / Procedures / Treatments   Labs (all labs ordered are listed, but only abnormal results are displayed) Labs Reviewed  CBC WITH DIFFERENTIAL/PLATELET  COMPREHENSIVE METABOLIC PANEL  LIPASE, BLOOD    EKG None  Radiology No results found.  Procedures Procedures    Medications Ordered in ED Medications  lactated ringers bolus 1,000 mL (has no administration in time range)  lactated ringers infusion (has no administration in time range)    ED Course/ Medical Decision Making/ A&P                             Medical Decision Making Amount and/or Complexity of Data Reviewed Labs: ordered. Radiology: ordered.  Risk Prescription drug management.   Patient presented with abdominal pain and abdominal CT per interpretation shows constipation.  No signs of acute instrument obstruction.  Labs here are overall reassuring.  Patient treated with IV medication and fluids.  Patient stable for discharge and follow-up with his doctor        Final Clinical Impression(s) / ED Diagnoses Final diagnoses:  None    Rx / DC Orders ED Discharge Orders     None         Lorre Nick, MD 08/18/22 1345

## 2022-08-20 ENCOUNTER — Other Ambulatory Visit: Payer: Self-pay

## 2022-08-21 ENCOUNTER — Telehealth: Payer: Self-pay | Admitting: *Deleted

## 2022-08-21 NOTE — Transitions of Care (Post Inpatient/ED Visit) (Signed)
   08/21/2022  Name: Gabriel Dalton MRN: [id number] DOB: 06-21-1985  Today's TOC FU Call Status: Today's TOC FU Call Status:: Unsuccessul Call (1st Attempt) Unsuccessful Call (1st Attempt) Date: 08/21/22  Attempted to reach the patient regarding the most recent Inpatient/ED visit.  Follow Up Plan: Additional outreach attempts will be made to reach the patient to complete the Transitions of Care (Post Inpatient/ED visit) call.   Signature Angelina Ok, RN 08/21/2022 11:42 AM.

## 2022-08-22 ENCOUNTER — Other Ambulatory Visit: Payer: Self-pay | Admitting: Student

## 2022-08-22 NOTE — Transitions of Care (Post Inpatient/ED Visit) (Signed)
   08/22/2022  Name: Aerik Bandura MRN: [id number] DOB: 1986-05-07  Today's TOC FU Call Status: Today's TOC FU Call Status:: Unsuccessul Call (1st Attempt) Unsuccessful Call (1st Attempt) Date: 08/21/22  Transition Care Management Follow-up Telephone Call Date of Discharge: 08/18/22 Discharge Facility: [facility]) Type of Discharge: Emergency Department Reason for ED Visit: Other: (Abdominal pain) How have you been since you were released from the hospital?: Better Any questions or concerns?: No  Items Reviewed: Did you receive and understand the discharge instructions provided?: No Medications obtained and verified?: Yes (Medications Reviewed) Any new allergies since your discharge?: No Dietary orders reviewed?: Yes Type of Diet Ordered:: 6 small meds a day . Do you have support at home?: Yes People in Home: spouse Name of Support/Comfort Primary Source: Vito Berger  Home Care and Equipment/Supplies: Were Home Health Services Ordered?: Yes Name of Home Health Agency:: [facility] Any new equipment or medical supplies ordered?: No  Functional Questionnaire: Do you need assistance with bathing/showering or dressing?: Yes Do you need assistance with meal preparation?: Yes Do you need assistance with eating?: Yes Do you have difficulty maintaining continence: Yes Do you need assistance with getting out of bed/getting out of a chair/moving?: No (problems with getting around) Do you have difficulty managing or taking your medications?: No  Follow up appointments reviewed: PCP Follow-up appointment confirmed?: Yes Date of PCP follow-up appointment?: 09/02/22 Follow-up Provider: [facility] Follow-up appointment confirmed?: No Do you need transportation to your follow-up appointment?: No Do you understand care options if your condition(s) worsen?: Yes-patient verbalized understanding    Inda Castle, RN 08/22/2022 9:45 AM

## 2022-08-22 NOTE — Telephone Encounter (Signed)
Next appt scheduled 4/23 with PCP. 

## 2022-08-29 ENCOUNTER — Other Ambulatory Visit (HOSPITAL_COMMUNITY): Payer: Self-pay

## 2022-09-02 ENCOUNTER — Other Ambulatory Visit: Payer: Self-pay

## 2022-09-02 ENCOUNTER — Encounter: Payer: Self-pay | Admitting: Student

## 2022-09-02 ENCOUNTER — Ambulatory Visit: Payer: 59 | Admitting: Internal Medicine

## 2022-09-02 ENCOUNTER — Ambulatory Visit (INDEPENDENT_AMBULATORY_CARE_PROVIDER_SITE_OTHER): Payer: 59 | Admitting: Student

## 2022-09-02 VITALS — BP 125/87 | HR 90 | Temp 97.8°F | Wt 110.4 lb

## 2022-09-02 DIAGNOSIS — M4716 Other spondylosis with myelopathy, lumbar region: Secondary | ICD-10-CM

## 2022-09-02 DIAGNOSIS — R011 Cardiac murmur, unspecified: Secondary | ICD-10-CM

## 2022-09-02 DIAGNOSIS — E43 Unspecified severe protein-calorie malnutrition: Secondary | ICD-10-CM

## 2022-09-02 DIAGNOSIS — K631 Perforation of intestine (nontraumatic): Secondary | ICD-10-CM | POA: Diagnosis not present

## 2022-09-02 DIAGNOSIS — M47816 Spondylosis without myelopathy or radiculopathy, lumbar region: Secondary | ICD-10-CM | POA: Diagnosis not present

## 2022-09-02 MED ORDER — GABAPENTIN 300 MG PO CAPS: ORAL_CAPSULE | ORAL | 1 refills | Status: DC

## 2022-09-02 NOTE — Progress Notes (Signed)
CC: Follow-up  HPI:  GabrielGabriel Dalton is a 37 y.o. male with PMH as below who presents to clinic accompanied by his mother for follow-up on his chronic medical problems and pain. Please see problem based charting for evaluation, assessment and plan.  Past Medical History:  Diagnosis Date   Abdominal fluid collection 07/11/2022   BPH (benign prostatic hyperplasia)    Bronchitis    Melena 04/25/2019   Multiple gastric ulcers    Peritonitis 06/26/2022    Review of Systems:  Constitutional: Positive for weight gain and fatigue Eyes: Negative for visual changes Respiratory: Positive for occasional shortness of breath. Cardiac: Negative for chest pain or palpitations MSK: Positive for back pain Abdomen: Positive for abdominal pain, abdominal cramping and constipation. Neuro: Positive for numbness and tingling in his lower extremities and weakness. Negative for headache or dizziness  Physical Exam: General: Pleasant, thin, undernourished middle-age man. No acute distress. Cardiac: RRR. Systolic click murmur. No rubs or gallops. No LE edema. Respiratory: Lungs CTAB. No wheezing or crackles. Abdominal: Flat. Abdominal incision healing with a central scab, no surrounding erythema.  Moderate tenderness to palpation around the incision site.  Normal bowel sounds. Skin: Warm, dry and intact without rashes or lesions. No increased elasticity of the skin. MSK: Mild scoliosis of the L-spine. Mild tenderness to palpation of the L-spine. Extremities: Atraumatic. Radial and DP pulses 2+ and symmetric. Slightly elongated toes.  No joint hypermobility or hyperextension. Neuro: A&O x 3. Moves all extremities. Strength 4/5 in bilateral lower extremities. Weak dorsiflexion bilaterally. Decreased sensation to the lower extremities up to both thighs, sensation worse on the right. Ataxic gait. Psych: Appropriate mood and affect.  Vitals:   09/02/22 1118  BP: 125/87  Pulse: 90  Temp:  97.8 F (36.6 C)  TempSrc: Oral  SpO2: 100%  Weight: 110 lb 6.4 oz (50.1 kg)    Assessment & Plan:   Small bowel perforation [facility]) Gabriel Dalton was found to have small bowel perforation and underwent exploratory laparotomy with small bowel resection with temporary abd closure on 2/14 and washout with abd closure on 2/16. He follows closely with central [place] surgery.  His abdominal wound has been healing appropriately. He continues to have some discomfort with eating as well as pain around the incision site. His constipation has been slowly improving.  His appetite is much better and he has been slowly gaining some weight.  It is still unclear the cause of his bowel perforation and due to multiple family members having unexplained bowel obstructions and perforations, patient has been referred for genetic testing by surgery.  Plan: -Continue Robaxin every 8 hours as needed for muscle spasms -Continue daily MiraLAX and as needed simethicone Bentyl and Zofran -Follow-up with surgery as needed  Lumbar spondylosis with myelopathy Patient presents today with worsening of his back pain and lower extremity extremity numbness. Patient states he has had the symptoms on and off for about a year but over the last few weeks, he has noticed worsening numbness especially on his right leg. He reports decreased feeling from his waist down and difficulty extending his toes. On exam, he does report some decrease sensation to light touch in his lower extremities and this is slightly worse on the right.  He also has weak dorsiflexion bilaterally. He recently completed home PT/OT. His surgeon referred him to outpatient PT/OT however they are located in [place] and he does not have consistent transportation there.  I will refer him to IR for injection and  PT/OT here in [place].  Plan: -Referral to IR for steroid injection -PT/OT referral -Start gabapentin 300 mg 3 times daily, I advised patient to take 300 mg  nightly for 1 week to ensure he is able to tolerate it. -Continue Tylenol and as needed Robaxin    Severe protein-calorie malnutrition (HCC) Patient is slowly getting back some weight. His appetite has improved and he continues to drink Ensure between his meals. He is taking his vitamins and has been working with PT/OT at home to increase his strength. His weight is up 9 lb since his last office visit 1 month ago.  Body mass index is 16.79 kg/m. Filed Weights   09/02/22 1118  Weight: 110 lb 6.4 oz (50.1 kg)   Plan: -Continue nutritional and vitamin supplementation   Heart murmur Patient found to have systolic click murmur on auscultation of the heart. His lungs are clear with no other signs of heart failure. He informs me that he vaguely remember being told that he had a murmur many years ago. He states that once in a while, he will have some chest discomfort when he took a deep breath. He denies any recent chest pain, dizziness, shortness of breath, syncope or palpitations. His murmur sounds like a mitral valve prolapse which can been seen in connective tissue disorders such as Ehlers-Danlos syndrome or Marfans.  -Check echocardiogram to further evaluate    See Encounters Tab for problem based charting.  Patient discussed with Dr. Lewie Chamber, MD, MPH

## 2022-09-02 NOTE — Patient Instructions (Addendum)
Thank you, [name] for allowing [place] to provide your care today. Today we discussed your abdominal symptoms, back pain and neuropathy.   I have ordered an echocardiogram to evaluate your heart murmur  I have referred you to IR for management of your back pain   I have ordered the following medication/changed the following medications:  Start gabapentin 300 mg at night for 1 week and then three times daily  My Chart Access: [email or web address]  Please follow-up in 3 months  Please make sure to arrive 15 minutes prior to your next appointment. If you arrive late, you may be asked to reschedule.    We look forward to seeing you next time. Please call our clinic at [phone] if you have any questions or concerns. The best time to call is Monday-Friday from 9am-4pm, but there is someone available 24/7. If after hours or the weekend, call the main hospital number and ask for the Internal Medicine Resident On-Call. If you need medication refills, please notify your pharmacy one week in advance and they will send [place] a request.   Thank you for letting us take part in your care. Wishing you the best!  Steffanie Rainwater, MD 09/02/2022, 12:00 PM IM Resident, PGY-3 [name] 41:10

## 2022-09-03 ENCOUNTER — Encounter: Payer: Self-pay | Admitting: Student

## 2022-09-03 DIAGNOSIS — R011 Cardiac murmur, unspecified: Secondary | ICD-10-CM | POA: Insufficient documentation

## 2022-09-03 DIAGNOSIS — M4716 Other spondylosis with myelopathy, lumbar region: Secondary | ICD-10-CM | POA: Insufficient documentation

## 2022-09-03 NOTE — Assessment & Plan Note (Addendum)
Patient found to have systolic click murmur on auscultation of the heart. His lungs are clear with no other signs of heart failure. He informs me that he vaguely remember being told that he had a murmur many years ago. He states that once in a while, he will have some chest discomfort when he took a deep breath. He denies any recent chest pain, dizziness, shortness of breath, syncope or palpitations. His murmur sounds like a mitral valve prolapse which can been seen in connective tissue disorders such as Ehlers-Danlos syndrome or Marfans.  -Check echocardiogram to further evaluate

## 2022-09-03 NOTE — Assessment & Plan Note (Signed)
Patient is slowly getting back some weight. His appetite has improved and he continues to drink Ensure between his meals. He is taking his vitamins and has been working with PT/OT at home to increase his strength. His weight is up 9 lb since his last office visit 1 month ago.  Body mass index is 16.79 kg/m. Filed Weights   09/02/22 1118  Weight: 110 lb 6.4 oz (50.1 kg)   Plan: -Continue nutritional and vitamin supplementation

## 2022-09-03 NOTE — Assessment & Plan Note (Signed)
Patient presents today with worsening of his back pain and lower extremity extremity numbness. Patient states he has had the symptoms on and off for about a year but over the last few weeks, he has noticed worsening numbness especially on his right leg. He reports decreased feeling from his waist down and difficulty extending his toes. On exam, he does report some decrease sensation to light touch in his lower extremities and this is slightly worse on the right.  He also has weak dorsiflexion bilaterally. He recently completed home PT/OT. His surgeon referred him to outpatient PT/OT however they are located in [place] and he does not have consistent transportation there.  I will refer him to IR for injection and PT/OT here in [place].  Plan: -Referral to IR for steroid injection -PT/OT referral -Start gabapentin 300 mg 3 times daily, I advised patient to take 300 mg nightly for 1 week to ensure he is able to tolerate it. -Continue Tylenol and as needed Robaxin

## 2022-09-03 NOTE — Assessment & Plan Note (Addendum)
Gabriel Dalton was found to have small bowel perforation and underwent exploratory laparotomy with small bowel resection with temporary abd closure on 2/14 and washout with abd closure on 2/16. He follows closely with central  surgery.  His abdominal wound has been healing appropriately. He continues to have some discomfort with eating as well as pain around the incision site. His constipation has been slowly improving.  His appetite is much better and he has been slowly gaining some weight.  It is still unclear the cause of his bowel perforation and due to multiple family members having unexplained bowel obstructions and perforations, patient has been referred for genetic testing by surgery.  Plan: -Continue Robaxin every 8 hours as needed for muscle spasms -Continue daily MiraLAX and as needed simethicone Bentyl and Zofran -Follow-up with surgery as needed

## 2022-09-10 NOTE — Progress Notes (Signed)
Internal Medicine Clinic Attending  Case discussed with the resident at the time of the visit.  We reviewed the resident's history and exam and pertinent patient test results.  I agree with the assessment, diagnosis, and plan of care documented in the resident's note.  

## 2022-09-11 ENCOUNTER — Ambulatory Visit: Payer: 59 | Attending: Surgery | Admitting: Occupational Therapy

## 2022-09-11 ENCOUNTER — Other Ambulatory Visit: Payer: Self-pay

## 2022-09-11 ENCOUNTER — Ambulatory Visit: Payer: 59 | Admitting: Physical Therapy

## 2022-09-11 ENCOUNTER — Encounter: Payer: Self-pay | Admitting: Physical Therapy

## 2022-09-11 DIAGNOSIS — R2681 Unsteadiness on feet: Secondary | ICD-10-CM

## 2022-09-11 DIAGNOSIS — M6281 Muscle weakness (generalized): Secondary | ICD-10-CM | POA: Diagnosis present

## 2022-09-11 DIAGNOSIS — R2689 Other abnormalities of gait and mobility: Secondary | ICD-10-CM

## 2022-09-11 NOTE — Therapy (Signed)
OUTPATIENT PHYSICAL THERAPY NEURO EVALUATION   Patient Name: Gabriel Dalton MRN: [id number] DOB:1985-08-04, 37 y.o., male 50 Date: 09/11/2022   PCP: Steffanie Rainwater, MD  REFERRING PROVIDER: Gust Rung, DO   END OF SESSION:  PT End of Session - 09/11/22 1617     Visit Number 1    Number of Visits 13    Date for PT Re-Evaluation 10/24/22    Authorization Type [employer]    Authorization - Visit Number 1    Authorization - Number of Visits 50    PT Start Time 0850    PT Stop Time 0930    PT Time Calculation (min) 40 min    Equipment Utilized During Treatment Gait belt    Activity Tolerance Patient tolerated treatment well    Behavior During Therapy [facility] for tasks assessed/performed             Past Medical History:  Diagnosis Date   Abdominal fluid collection 07/11/2022   BPH (benign prostatic hyperplasia)    Bronchitis    Melena 04/25/2019   Multiple gastric ulcers    Peritonitis (HCC) 06/26/2022   Past Surgical History:  Procedure Laterality Date   CHOLECYSTECTOMY N/A 06/25/2022   Procedure: LAPAROSCOPIC CHOLECYSTECTOMY;  Surgeon: Quentin Ore, MD;  Location: [facility];  Service: General;  Laterality: N/A;   COLONOSCOPY     I & D EXTREMITY  10/18/2011   Procedure: IRRIGATION AND DEBRIDEMENT EXTREMITY;  Surgeon: Dominica Severin, MD;  Location: [facility];  Service: Orthopedics;  Laterality: Right;  Irrigation and Debridement  Right Middle Finger; Removal of foreign body   LAPAROTOMY N/A 06/27/2022   Procedure: EXPLORATORY LAPAROTOMY, WASH OUT,  ABDOMINAL CLOSURE;  Surgeon: Quentin Ore, MD;  Location: [facility];  Service: General;  Laterality: N/A;   Patient Active Problem List   Diagnosis Date Noted   Lumbar spondylosis with myelopathy 09/03/2022   Heart murmur 09/03/2022   Vitamin D deficiency 08/01/2022   Thiamine deficiency 08/01/2022   Electrolyte abnormality 07/09/2022   Benign prostatic hyperplasia 07/09/2022   Severe  protein-calorie malnutrition (HCC) 06/25/2022   Small bowel perforation (HCC) 06/25/2022   Cholelithiasis 06/24/2022   Early satiety 05/27/2022   Nausea and vomiting 05/27/2022   Esophagitis determined by endoscopy 04/23/2022   Abnormal colonoscopy 12/26/2021   Gastroesophageal reflux disease 12/26/2021   Normocytic anemia 04/25/2019   Gastric ulcers 04/25/2019   Generalized abdominal pain 04/01/2019    ONSET DATE: 09/03/2022 (MD referral); abdominal surgery 06/2022  REFERRING DIAG:  M47.816 (ICD-10-CM) - Lumbar spondylosis  M47.16 (ICD-10-CM) - Lumbar spondylosis with myelopathy    THERAPY DIAG:  Muscle weakness (generalized)  Other abnormalities of gait and mobility  Unsteadiness on feet  Rationale for Evaluation and Treatment: Rehabilitation  SUBJECTIVE:  SUBJECTIVE STATEMENT: Started with gall bladder removal and then had to go forward to abdominal surgery.  Have a bulging disk in low back, having numbness in legs and weakness; difficulty lifting toes/wiggling toes.  Has had some near falls with feet catching the ground. Pt accompanied by: significant other  PERTINENT HISTORY: underwent exploratory laparotomy with small bowel resection with temporary abdominal closure on 06/25/22, and washout with abdominal closure on 06/27/22.   PAIN:  Are you having pain? Yes: NPRS scale: 7/10 Pain location: back Pain description: stiffness Aggravating factors: standing too long, sitting too long Relieving factors: changing positions  PRECAUTIONS: Other: Lifting restrictions  WEIGHT BEARING RESTRICTIONS: No  FALLS: Has patient fallen in last 6 months? Yes. Number of falls 1  LIVING ENVIRONMENT: Lives with: lives with their spouse Lives in: House/apartment Stairs: Yes: External: 6 steps; on right  going up and on left going up Has following equipment at home: Single point cane  PLOF: Independent and Vocation/Vocational requirements: truck driver  PATIENT GOALS: To get strength, balance, stability back  OBJECTIVE:   DIAGNOSTIC FINDINGS: See MRI:  At L5-S1, there is mild disc degeneration. Small left subarticular disc protrusion (at site of posterior annular fissure). The disc protrusion results in slight left subarticular narrowing, and contacts the descending left S1 nerve root. No significant central canal stenosis. COGNITION: Overall cognitive status: Within functional limits for tasks assessed   SENSATION: Light touch: Impaired   COORDINATION: Slightly slowed heel to shin  EDEMA:  Feet swell sometimes  POSTURE: No Significant postural limitations  LOWER EXTREMITY ROM:     Passive  Right Eval Left Eval  Hip flexion    Hip extension    Hip abduction    Hip adduction    Hip internal rotation    Hip external rotation    Knee flexion    Knee extension    Ankle dorsiflexion 12 12  Ankle plantarflexion    Ankle inversion    Ankle eversion     (Blank rows = not tested)  LOWER EXTREMITY MMT:    MMT Right Eval Left Eval  Hip flexion 4 4  Hip extension    Hip abduction    Hip adduction    Hip internal rotation    Hip external rotation    Knee flexion 4 3+  Knee extension 4+ 4+  Ankle dorsiflexion 1 1  Ankle plantarflexion 3- 3-  Ankle inversion 3- 3-  Ankle eversion 3- 3-  (Blank rows = not tested)   TRANSFERS: Assistive device utilized: None  Sit to stand: Modified independence Stand to sit: Modified independence  GAIT: Gait pattern: step through pattern, decreased step length- Right, decreased step length- Left, decreased ankle dorsiflexion- Right, decreased ankle dorsiflexion- Left, Right steppage, and Left steppage Distance walked: 50 ft x 2 Assistive device utilized: Single point cane Level of assistance: Modified  independence  FUNCTIONAL TESTS:  5 times sit to stand: 19.97 sec arms crossed at chest Timed up and go (TUG): 17.62 10 meter walk test: 13.25 sec cane (2.48 ft/sec) DGI :  12/24 SLS:  RLE 4 sec, LLE 1 sec   [facility] PT Assessment - 09/11/22 0001       Standardized Balance Assessment   Standardized Balance Assessment Dynamic Gait Index      Dynamic Gait Index   Level Surface Moderate Impairment    Change in Gait Speed Moderate Impairment    Gait with Horizontal Head Turns Mild Impairment    Gait with Vertical Head Turns Mild  Impairment    Gait and Pivot Turn Mild Impairment    Step Over Obstacle Severe Impairment    Step Around Obstacles Mild Impairment    Steps Mild Impairment    Total Score 12    DGI comment: Scores <19/24 indicate increased fall risk              TODAY'S TREATMENT:                                                                                                                              DATE: 09/11/2022    PATIENT EDUCATION: Education details: PT eval results, POC, initiated HEP for gentle low back ex and sit<>stand Person educated: Patient and Spouse Education method: Explanation, Demonstration, and Handouts Education comprehension: verbalized understanding and returned demonstration  HOME EXERCISE PROGRAM: Access Code: [email or web address] URL: [email or web address] Date: 09/11/2022 Prepared by: [facility] - Outpatient  Rehab - [facility]  Exercises - Supine Lower Trunk Rotation  - 1 x daily - 7 x weekly - 1 sets - 3-5 reps - Hooklying Single Knee to Chest Stretch  - 1 x daily - 7 x weekly - 1 sets - 3 reps - 15 sec hold - Sit to Stand  - 1 x daily - 5 x weekly - 3 sets - 5 reps  GOALS: Goals reviewed with patient? Yes  SHORT TERM GOALS: Target date: 10/10/2022  Pt will be independent with HEP for improved strength, balance, gait. Baseline: Goal status: INITIAL  2.  Pt will report reduction in back pain by at least 50 % for  improved functional mobility. Baseline: 7/10 Goal status: INITIAL  3.  Pt will improve 5x sit<>stand to less than or equal to 15 sec to demonstrate improved functional strength and transfer efficiency.  Baseline: 19.97 sec Goal status: INITIAL  LONG TERM GOALS: Target date: 10/24/2022  Pt will be independent with HEP for improved strength, balance, pain, gait. Baseline:  Goal status: INITIAL  2.  Pt will improve DGI score to at least 17/24 to decrease fall risk. Baseline: 12/24 Goal status: INITIAL  3.  Pt will improve 5x sit<>stand to less than or equal to 11.4 sec to demonstrate improved functional strength and transfer efficiency. Baseline: 19.97 sec Goal status: INITIAL  4.  Pt will improve gait velocity to at least 2.62 ft/sec for improved gait efficiency and safety. Baseline: 2.48 ft/sec with cane Goal status: INITIAL  ASSESSMENT:  CLINICAL IMPRESSION: Patient is a 37 y.o. male who was seen today for physical therapy evaluation and treatment for lumbar spondylosis, post-abdominal surgery.  Pt reports hx of numbness, tingling, decreased strength in BLEs over the past several months, even prior to abdominal surgery.  MRI in  February 2024 shows disc bulge with some nerve involvement at L5-S1.  He presents with back pain, trace ankle dorsiflexion, decreased sensation, decreased strength, decreased balance, abnormal gait. He will benefit from skilled PT to address the above stated deficits for improved  pain and strength, safety with gait and balance for return to independent PLOF.  OBJECTIVE IMPAIRMENTS: Abnormal gait, decreased balance, decreased mobility, difficulty walking, decreased ROM, decreased strength, impaired flexibility, and impaired sensation.   ACTIVITY LIMITATIONS: carrying, lifting, bending, standing, stairs, transfers, and locomotion level  PARTICIPATION LIMITATIONS: driving, community activity, and occupation  PERSONAL FACTORS: 3+ comorbidities: see above;  recent abdominal surgery 06/2022  are also affecting patient's functional outcome.   REHAB POTENTIAL: Good  CLINICAL DECISION MAKING: Evolving/moderate complexity  EVALUATION COMPLEXITY: Moderate  PLAN:  PT FREQUENCY: 2x/week  PT DURATION: 6 weeks  PLANNED INTERVENTIONS: Therapeutic exercises, Therapeutic activity, Neuromuscular re-education, Balance training, Gait training, Patient/Family education, Self Care, Joint mobilization, Dry Needling, Electrical stimulation, and Manual therapy  PLAN FOR NEXT SESSION: Assess extension/McKenzie exercises for low back (L5-S1) and may need to communicate with [facility] about traction.  Try DN, manual therapy (need to ask about bowel and bladder, but did not state any difficulties at eval).  Also work on safety with mobility-trial foot up brace, BLE strengthening.   Gean Maidens., PT 09/11/2022, 4:18 PM  [facility] [address] [place], [address] [place], [place] [address] Phone # [phone] Fax # [phone]

## 2022-09-11 NOTE — Therapy (Signed)
OUTPATIENT OCCUPATIONAL THERAPY NEURO EVALUATION  Patient Name: Gabriel Dalton MRN: [id number] DOB:Sep 06, 1985, 37 y.o., male 54 Date: 09/11/2022  PCP: Steffanie Rainwater, MD REFERRING PROVIDER: Gust Rung, DO  END OF SESSION:  OT End of Session - 09/11/22 0903     Visit Number 1    Number of Visits 5    Date for OT Re-Evaluation 10/10/22    Authorization Type [employer]    OT Start Time 0801    OT Stop Time 0846    OT Time Calculation (min) 45 min             Past Medical History:  Diagnosis Date   Abdominal fluid collection 07/11/2022   BPH (benign prostatic hyperplasia)    Bronchitis    Melena 04/25/2019   Multiple gastric ulcers    Peritonitis (HCC) 06/26/2022   Past Surgical History:  Procedure Laterality Date   CHOLECYSTECTOMY N/A 06/25/2022   Procedure: LAPAROSCOPIC CHOLECYSTECTOMY;  Surgeon: Quentin Ore, MD;  Location: [facility];  Service: General;  Laterality: N/A;   COLONOSCOPY     I & D EXTREMITY  10/18/2011   Procedure: IRRIGATION AND DEBRIDEMENT EXTREMITY;  Surgeon: Dominica Severin, MD;  Location: [facility];  Service: Orthopedics;  Laterality: Right;  Irrigation and Debridement  Right Middle Finger; Removal of foreign body   LAPAROTOMY N/A 06/27/2022   Procedure: EXPLORATORY LAPAROTOMY, WASH OUT,  ABDOMINAL CLOSURE;  Surgeon: Quentin Ore, MD;  Location: [facility];  Service: General;  Laterality: N/A;   Patient Active Problem List   Diagnosis Date Noted   Lumbar spondylosis with myelopathy 09/03/2022   Heart murmur 09/03/2022   Vitamin D deficiency 08/01/2022   Thiamine deficiency 08/01/2022   Electrolyte abnormality 07/09/2022   Benign prostatic hyperplasia 07/09/2022   Severe protein-calorie malnutrition (HCC) 06/25/2022   Small bowel perforation (HCC) 06/25/2022   Cholelithiasis 06/24/2022   Early satiety 05/27/2022   Nausea and vomiting 05/27/2022   Esophagitis determined by endoscopy 04/23/2022   Abnormal  colonoscopy 12/26/2021   Gastroesophageal reflux disease 12/26/2021   Normocytic anemia 04/25/2019   Gastric ulcers 04/25/2019   Generalized abdominal pain 04/01/2019    ONSET DATE: referral date 09/03/22  REFERRING DIAG: M47.816 (ICD-10-CM) - Lumbar spondylosis M47.16 (ICD-10-CM) - Lumbar spondylosis with myelopathy  THERAPY DIAG:  Muscle weakness (generalized)  Unsteadiness on feet  Other abnormalities of gait and mobility  Rationale for Evaluation and Treatment: Rehabilitation  SUBJECTIVE:   SUBJECTIVE STATEMENT: Pt reports having surgery on 2/14 to remove gallbladder and noted intestines were ruptured.  Pt reports continued numbness and weakness from waist to toes with difficulty with walking.  Pt also reports decreased sustained strength on L side, reporting occasionally dropping items.   Pt accompanied by: self and significant other (spouse, Taisha)  PERTINENT HISTORY: BPH, bronchitis, Multiple gastric ulcers  PRECAUTIONS: Other: lifting restriction, no > 5-8#  WEIGHT BEARING RESTRICTIONS: No  PAIN:  Are you having pain? No  FALLS: Has patient fallen in last 6 months? Yes. Number of falls 1, trying to walk in kitchen and R foot gave out  LIVING ENVIRONMENT: Lives with: lives with their spouse Lives in: House/apartment Stairs: Yes: Internal: small threshold from living room to kitchen steps; and External: 6-8 steps; can reach both Has following equipment at home: Single point cane, Walker - 2 wheeled, Environmental consultant - 4 wheeled, Wheelchair (manual), and Grab bars  PLOF: Independent, Independent with basic ADLs, and Vocation/Vocational requirements: regional truck driver  PATIENT GOALS: get my balance back,  get my breath and energy back  OBJECTIVE:   HAND DOMINANCE: Right  ADLs: Overall ADLs: initially s/p surgery pt was requiring increased time and assist for bathing/dressing, however in last 2-3 weeks pt has been able to complete without assist. Transfers/ambulation  related to ADLs: Utilizing [facility] Eating: Mod I Grooming: Mod I UB Dressing: Mod I LB Dressing: putting on shoes is a challenge Toileting: a little challenging due to low toilet seat, but Mod I Bathing: Mod I Tub Shower transfers: Mod I Equipment: Grab bars  IADLs: Shopping: recently resumed going to store with spouse, utilizing [employer] or w/c if taking a longer trip Light housekeeping: spouse primarily taking care of these task prior to and now.  Limited now due to lifting restriction Meal Prep: spouse primarily cooking Community mobility: short distance community driving Medication management: spouse reminding pt to take  MOBILITY STATUS: Needs Assist: utilizing SPC for ambulation and Hx of falls  POSTURE COMMENTS:  No Significant postural limitations  ACTIVITY TOLERANCE: Activity tolerance: decreased endurance  UPPER EXTREMITY ROM:  WFL bilaterally   UPPER EXTREMITY MMT:     MMT Right eval Left eval  Shoulder flexion 5/5 4/5  Shoulder abduction    Shoulder adduction    Shoulder extension    Shoulder internal rotation    Shoulder external rotation    Middle trapezius    Lower trapezius    Elbow flexion 5/5 4+/5  Elbow extension 5/5 4+/5  Wrist flexion    Wrist extension    Wrist ulnar deviation    Wrist radial deviation    Wrist pronation    Wrist supination    (Blank rows = not tested)  HAND FUNCTION: Grip strength: Right: 76.5 lbs; Left: 61 lbs, Lateral pinch: Right: 20 lbs, Left: 16 lbs, and 3 point pinch: Right: 16 lbs, Left: 15 lbs  COORDINATION: Finger Nose Finger test: [facility] 9 Hole Peg test: Right: 20.19 sec; Left: 21.38 sec Box and Blocks:  Right 56 blocks, Left 49 blocks  SENSATION: WFL in UB, numbness and tingling in BLE   COGNITION: Overall cognitive status: Within functional limits for tasks assessed  VISION: Subjective report: no issues  OBSERVATIONS: generalized debility s/p abdominal surgery   TODAY'S TREATMENT:                                                                                                                               09/11/22 Provided with yellow theraputty and encouraged pt to engage in full grip, pinch, twist movements.  Plan to provide more specific HEP at next session.  PATIENT EDUCATION: Education details: Educated on role and purpose of OT as well as potential interventions and goals for therapy based on initial evaluation findings. Person educated: Patient and Spouse Education method: Explanation Education comprehension: verbalized understanding and needs further education  HOME EXERCISE PROGRAM: TBD   GOALS: Goals reviewed with patient? Yes  LONG TERM GOALS: Target date: 10/10/22  Pt will be independent  with hand/pinch strengthening HEP. Baseline:  Goal status: INITIAL  2.  Pt will verbalize understanding of energy conservation strategies to increase safety and independence with ADLs and IADLs. Baseline:  Goal status: INITIAL  3.  Pt will demonstrate improved L grip strength by 5# to allow for increased ease with opening containers and sustained grasp as needed for ADLs/IADLs, and return to work. Baseline: Right: 76.5 lbs; Left: 61 lbs,  Goal status: INITIAL  4.  Pt will demonstrate improved activity tolerance to allow for return to short shopping trips at ambulatory level. Baseline:  Goal status: INITIAL  5.  Pt will report understanding of recommendations in regards to safe return to work. Baseline:  Goal status: INITIAL   ASSESSMENT:  CLINICAL IMPRESSION: Patient is a 37 y.o. male who was seen today for occupational therapy evaluation for impairments s/p abdominal surgery with decreased endurance, balance, and reports of decreased sustained strength of LUE. Pt has progressed to Mod I with ADLs, however continues to be limited in IADLs due to weakness of LUE, impaired balance, endurance, and limitations due to lifting restrictions.  PERFORMANCE DEFICITS: in functional skills  including IADLs, strength, pain, Gross motor control, balance, body mechanics, endurance, cardiopulmonary status limiting function, decreased knowledge of use of DME, and UE functional use.  IMPAIRMENTS: are limiting patient from IADLs, rest and sleep, and work.   CO-MORBIDITIES: may have co-morbidities  that affects occupational performance. Patient will benefit from skilled OT to address above impairments and improve overall function.  MODIFICATION OR ASSISTANCE TO COMPLETE EVALUATION: No modification of tasks or assist necessary to complete an evaluation.  OT OCCUPATIONAL PROFILE AND HISTORY: Problem focused assessment: Including review of records relating to presenting problem.  CLINICAL DECISION MAKING: LOW - limited treatment options, no task modification necessary  REHAB POTENTIAL: Good  EVALUATION COMPLEXITY: Low    PLAN:  OT FREQUENCY: 1x/week  OT DURATION: 4 weeks  PLANNED INTERVENTIONS: therapeutic exercise, therapeutic activity, neuromuscular re-education, manual therapy, balance training, functional mobility training, compression bandaging, moist heat, cryotherapy, patient/family education, energy conservation, coping strategies training, and DME and/or AE instructions  RECOMMENDED OTHER SERVICES: NA  CONSULTED AND AGREED WITH PLAN OF CARE: Patient and family member/caregiver  PLAN FOR NEXT SESSION: Initiate theraputty HEP, review energy conservation strategies.   Juleen Sorrels, OTR/L 09/11/2022, 9:05 AM

## 2022-09-15 NOTE — Therapy (Signed)
OUTPATIENT OCCUPATIONAL THERAPY TREATMENT NOTE  Patient Name: Gabriel Dalton MRN: [id number] DOB:September 05, 1985, 37 y.o., male Today's Date: 09/17/2022  PCP: Steffanie Rainwater, MD REFERRING PROVIDER: Gust Rung, DO  END OF SESSION:  OT End of Session - 09/17/22 0753     Visit Number 2    Number of Visits 5    Date for OT Re-Evaluation 10/10/22    Authorization Type [employer]    OT Start Time [id number]    OT Stop Time 0845    OT Time Calculation (min) 46 min    Activity Tolerance Patient tolerated treatment well;No increased pain;Patient limited by fatigue;Patient limited by pain    Behavior During Therapy [facility] for tasks assessed/performed              Past Medical History:  Diagnosis Date   Abdominal fluid collection 07/11/2022   BPH (benign prostatic hyperplasia)    Bronchitis    Melena 04/25/2019   Multiple gastric ulcers    Peritonitis (HCC) 06/26/2022   Past Surgical History:  Procedure Laterality Date   CHOLECYSTECTOMY N/A 06/25/2022   Procedure: LAPAROSCOPIC CHOLECYSTECTOMY;  Surgeon: Quentin Ore, MD;  Location: [facility];  Service: General;  Laterality: N/A;   COLONOSCOPY     I & D EXTREMITY  10/18/2011   Procedure: IRRIGATION AND DEBRIDEMENT EXTREMITY;  Surgeon: Dominica Severin, MD;  Location: [facility];  Service: Orthopedics;  Laterality: Right;  Irrigation and Debridement  Right Middle Finger; Removal of foreign body   LAPAROTOMY N/A 06/27/2022   Procedure: EXPLORATORY LAPAROTOMY, WASH OUT,  ABDOMINAL CLOSURE;  Surgeon: Quentin Ore, MD;  Location: [facility];  Service: General;  Laterality: N/A;   Patient Active Problem List   Diagnosis Date Noted   Lumbar spondylosis with myelopathy 09/03/2022   Heart murmur 09/03/2022   Vitamin D deficiency 08/01/2022   Thiamine deficiency 08/01/2022   Electrolyte abnormality 07/09/2022   Benign prostatic hyperplasia 07/09/2022   Severe protein-calorie malnutrition (HCC) 06/25/2022   Small  bowel perforation (HCC) 06/25/2022   Cholelithiasis 06/24/2022   Early satiety 05/27/2022   Nausea and vomiting 05/27/2022   Esophagitis determined by endoscopy 04/23/2022   Abnormal colonoscopy 12/26/2021   Gastroesophageal reflux disease 12/26/2021   Normocytic anemia 04/25/2019   Gastric ulcers 04/25/2019   Generalized abdominal pain 04/01/2019    ONSET DATE: referral date 09/03/22  REFERRING DIAG: M47.816 (ICD-10-CM) - Lumbar spondylosis M47.16 (ICD-10-CM) - Lumbar spondylosis with myelopathy  THERAPY DIAG:  Muscle weakness (generalized)  Rationale for Evaluation and Treatment: Rehabilitation  PERTINENT HISTORY: BPH, bronchitis, Multiple gastric ulcers Pt reports having surgery on 2/14 to remove gallbladder and noted intestines were ruptured.  Pt reports continued numbness and weakness from waist to toes with difficulty with walking.  Pt also reports decreased sustained strength on L side, reporting occasionally dropping items.    PRECAUTIONS: Other: lifting restriction, no > 5-8#   SUBJECTIVE:   SUBJECTIVE STATEMENT: He states having a burning pain around his scar and itching at times. Also at home, started having flatulence and signs of healing. Also states bil toe drop is concerning/safety hazard for him.    Pt accompanied by: (spouse, Taisha)  PAIN:   Are you having pain? 6/10 in abd sx scar this morning   FALLS: Has patient fallen in last 6 months? Yes. Number of falls 1, trying to walk in kitchen and R foot gave out  LIVING ENVIRONMENT: Lives with: lives with their spouse Lives in: House/apartment Stairs: Yes: Internal: small threshold from  living room to kitchen steps; and External: 6-8 steps; can reach both Has following equipment at home: Single point cane, Walker - 2 wheeled, Environmental consultant - 4 wheeled, Wheelchair (manual), and Grab bars  PLOF: Independent, Independent with basic ADLs, and Vocation/Vocational requirements: regional truck driver  PATIENT GOALS: get  my balance back, get my breath and energy back  OBJECTIVE: (All objective assessments below are from initial evaluation on: 09/11/22 unless otherwise specified.)    HAND DOMINANCE: Right  ADLs: Overall ADLs: initially s/p surgery pt was requiring increased time and assist for bathing/dressing, however in last 2-3 weeks pt has been able to complete without assist. Transfers/ambulation related to ADLs: Utilizing [facility] Eating: Mod I Grooming: Mod I UB Dressing: Mod I LB Dressing: putting on shoes is a challenge Toileting: a little challenging due to low toilet seat, but Mod I Bathing: Mod I Tub Shower transfers: Mod I Equipment: Grab bars  IADLs: Shopping: recently resumed going to store with spouse, utilizing [employer] or w/c if taking a longer trip Light housekeeping: spouse primarily taking care of these task prior to and now.  Limited now due to lifting restriction Meal Prep: spouse primarily cooking Community mobility: short distance community driving Medication management: spouse reminding pt to take  MOBILITY STATUS: Needs Assist: utilizing SPC for ambulation and Hx of falls  ACTIVITY TOLERANCE: Activity tolerance: decreased endurance  UPPER EXTREMITY ROM:  WFL bilaterally  UPPER EXTREMITY MMT:     MMT Right eval Left eval  Shoulder flexion 5/5 4/5  Shoulder abduction    Shoulder adduction    Shoulder extension    Shoulder internal rotation    Shoulder external rotation    Middle trapezius    Lower trapezius    Elbow flexion 5/5 4+/5  Elbow extension 5/5 4+/5  Wrist flexion    Wrist extension    Wrist ulnar deviation    Wrist radial deviation    Wrist pronation    Wrist supination    (Blank rows = not tested)  HAND FUNCTION: Grip strength: Right: 76.5 lbs; Left: 61 lbs, Lateral pinch: Right: 20 lbs, Left: 16 lbs, and 3 point pinch: Right: 16 lbs, Left: 15 lbs  COORDINATION: Finger Nose Finger test: [facility] Hole Peg test: Right: 20.19 sec; Left: 21.38 sec Box  and Blocks:  Right 56 blocks, Left 49 blocks  SENSATION: WFL in UB, numbness and tingling in BLE  OBSERVATIONS: generalized debility s/p abdominal surgery   TODAY'S TREATMENT:                                                                                                                              09/17/22: To help him manage scar and pain, OT edu on scar massage and progressive desensitization using a towel and a vibration. He states feeling better afterward. Due to drop foot c/o he was edu to monitor fatigue and gait patterns and not drag toes. Next, he was edu on and given hand  strength activities in various patterns (as below) with putty. He does well, needs cues for form, gets some fatigue, but overall states less pain at end of session in scar.   Exercises - Ulnar Nerve Flossing  - 3-4 x daily - 1-2 sets - 5-10 reps - Full Fist  - 2-3 x daily - 5 reps - "Duck Mouth" Strength  - 2-3 x daily - 5 reps - Finger Extension "Pizza!"   - 2-3 x daily - 5 reps - Finger Key Grip with Putty  - 2-3 x daily - 5 reps - Cutting Putty  - 2-3 x daily - 5 reps - Thumb Opposition with Putty  - 2-3 x daily - 5 reps Patient Education - Scar Massage & Desensitize   PATIENT EDUCATION: Education details: Educated on role and purpose of OT as well as potential interventions and goals for therapy based on initial evaluation findings. Person educated: Patient and Spouse Education method: Explanation Education comprehension: verbalized understanding and needs further education  HOME EXERCISE PROGRAM: Access Code: [email or web address] URL: [email or web address] Date: 09/17/2022 Prepared by: Fannie Knee   GOALS: Goals reviewed with patient? Yes  LONG TERM GOALS: Target date: 10/10/22  Pt will be independent with hand/pinch strengthening HEP. Baseline:  Goal status: INITIAL  2.  Pt will verbalize understanding of energy conservation strategies to increase safety and independence with ADLs and  IADLs. Baseline:  Goal status: INITIAL  3.  Pt will demonstrate improved L grip strength by 5# to allow for increased ease with opening containers and sustained grasp as needed for ADLs/IADLs, and return to work. Baseline: Right: 76.5 lbs; Left: 61 lbs,  Goal status: INITIAL  4.  Pt will demonstrate improved activity tolerance to allow for return to short shopping trips at ambulatory level. Baseline:  Goal status: INITIAL  5.  Pt will report understanding of recommendations in regards to safe return to work. Baseline:  Goal status: INITIAL   ASSESSMENT:  CLINICAL IMPRESSION: 09/17/22: He is learning to manage scar pain, bil coordination issues. He seems to have ulnar nerve specific weakness today with testing, so that was focused on with putty as well. Carry on.  Eval: Patient is a 37 y.o. male who was seen today for occupational therapy evaluation for impairments s/p abdominal surgery with decreased endurance, balance, and reports of decreased sustained strength of LUE. Pt has progressed to Mod I with ADLs, however continues to be limited in IADLs due to weakness of LUE, impaired balance, endurance, and limitations due to lifting restrictions.   PLAN:  OT FREQUENCY: 1x/week  OT DURATION: 4 weeks  PLANNED INTERVENTIONS: therapeutic exercise, therapeutic activity, neuromuscular re-education, manual therapy, balance training, functional mobility training, compression bandaging, moist heat, cryotherapy, patient/family education, energy conservation, coping strategies training, and DME and/or AE instructions  CONSULTED AND AGREED WITH PLAN OF CARE: Patient and family member/caregiver  PLAN FOR NEXT SESSION:  Review t-putty HEP as needed, check on scar pain, continue toward goals.   Fannie Knee, OTR/L 09/17/2022, 9:37 AM

## 2022-09-16 ENCOUNTER — Telehealth: Payer: Self-pay | Admitting: *Deleted

## 2022-09-16 NOTE — Therapy (Signed)
OUTPATIENT PHYSICAL THERAPY NEURO TREATMENT   Patient Name: Gabriel Dalton MRN: [id number] DOB:1986-04-20, 37 y.o., male 40 Date: 09/17/2022   PCP: Steffanie Rainwater, MD  REFERRING PROVIDER: Gust Rung, DO   END OF SESSION:  PT End of Session - 09/17/22 1209     Visit Number 2    Number of Visits 13    Date for PT Re-Evaluation 10/24/22    Authorization Type [employer]    Authorization - Visit Number 2    Authorization - Number of Visits 50    PT Start Time 0848    PT Stop Time 0931    PT Time Calculation (min) 43 min    Equipment Utilized During Treatment Gait belt    Activity Tolerance Patient tolerated treatment well    Behavior During Therapy [facility] for tasks assessed/performed              Past Medical History:  Diagnosis Date   Abdominal fluid collection 07/11/2022   BPH (benign prostatic hyperplasia)    Bronchitis    Melena 04/25/2019   Multiple gastric ulcers    Peritonitis (HCC) 06/26/2022   Past Surgical History:  Procedure Laterality Date   CHOLECYSTECTOMY N/A 06/25/2022   Procedure: LAPAROSCOPIC CHOLECYSTECTOMY;  Surgeon: Quentin Ore, MD;  Location: [facility];  Service: General;  Laterality: N/A;   COLONOSCOPY     I & D EXTREMITY  10/18/2011   Procedure: IRRIGATION AND DEBRIDEMENT EXTREMITY;  Surgeon: Dominica Severin, MD;  Location: [facility];  Service: Orthopedics;  Laterality: Right;  Irrigation and Debridement  Right Middle Finger; Removal of foreign body   LAPAROTOMY N/A 06/27/2022   Procedure: EXPLORATORY LAPAROTOMY, WASH OUT,  ABDOMINAL CLOSURE;  Surgeon: Quentin Ore, MD;  Location: [facility];  Service: General;  Laterality: N/A;   Patient Active Problem List   Diagnosis Date Noted   Lumbar spondylosis with myelopathy 09/03/2022   Heart murmur 09/03/2022   Vitamin D deficiency 08/01/2022   Thiamine deficiency 08/01/2022   Electrolyte abnormality 07/09/2022   Benign prostatic hyperplasia 07/09/2022   Severe  protein-calorie malnutrition (HCC) 06/25/2022   Small bowel perforation (HCC) 06/25/2022   Cholelithiasis 06/24/2022   Early satiety 05/27/2022   Nausea and vomiting 05/27/2022   Esophagitis determined by endoscopy 04/23/2022   Abnormal colonoscopy 12/26/2021   Gastroesophageal reflux disease 12/26/2021   Normocytic anemia 04/25/2019   Gastric ulcers 04/25/2019   Generalized abdominal pain 04/01/2019    ONSET DATE: 09/03/2022 (MD referral); abdominal surgery 06/2022  REFERRING DIAG:  M47.816 (ICD-10-CM) - Lumbar spondylosis  M47.16 (ICD-10-CM) - Lumbar spondylosis with myelopathy    THERAPY DIAG:  Muscle weakness (generalized)  Other abnormalities of gait and mobility  Unsteadiness on feet  Rationale for Evaluation and Treatment: Rehabilitation  SUBJECTIVE:  SUBJECTIVE STATEMENT: Having some bloating on my incision- reports that he was educated on scar massage. Notes belly pain after eating. Reports that he had a fall from his foot drop last month. Denies B&B changes but does reports N/T from waist to toes. Reports foot drop has been ongoing since a month before surgery.     Pt accompanied by: significant other  PERTINENT HISTORY: underwent exploratory laparotomy with small bowel resection with temporary abdominal closure on 06/25/22, and washout with abdominal closure on 06/27/22.   PAIN:  Are you having pain? Yes: NPRS scale: 7-8/10 Pain location: belly on scar Pain description: bloating Aggravating factors: eating Relieving factors: bowel movement  PRECAUTIONS: Other: Lifting restrictions  WEIGHT BEARING RESTRICTIONS: No  FALLS: Has patient fallen in last 6 months? Yes. Number of falls 1  LIVING ENVIRONMENT: Lives with: lives with their spouse Lives in: House/apartment Stairs:  Yes: External: 6 steps; on right going up and on left going up Has following equipment at home: Single point cane  PLOF: Independent and Vocation/Vocational requirements: truck driver  PATIENT GOALS: To get strength, balance, stability back  OBJECTIVE:      TODAY'S TREATMENT: 09/17/22 Activity Comments  Assist donning posterior leaf spring on R and posterior AFO with medial strut on L and ed on different styles    Gait training with/without SPC 4x50ft Good improvement in foot clearance but L knee recurvatum evident. Pt notes most comfort in L medial strut AFO however   Gait training with posterior leaf spring on R and posterior AFO with medial strut on L + heel lift on L   Good improvement in L knee stability; cues for arm swing; good normalized pattern evident   Assist donning L foot up brace and gait training with this 2x36ft Improved foot clearance on the L foot- pt noted benefit            PATIENT EDUCATION: Education details: edu on possible benefits of traction for herniated disc, possible need to be seen by spine specialist d/t B foot drop, edu on AFO benefits and advised PT would request AFO order and provided info on foot up brace to be used in the meantime  Person educated: Patient Education method: Explanation, Demonstration, Tactile cues, Verbal cues, and Handouts Education comprehension: verbalized understanding and returned demonstration   Below measures were taken at time of initial evaluation unless otherwise specified:   DIAGNOSTIC FINDINGS: See MRI:  At L5-S1, there is mild disc degeneration. Small left subarticular disc protrusion (at site of posterior annular fissure). The disc protrusion results in slight left subarticular narrowing, and contacts the descending left S1 nerve root. No significant central canal stenosis. COGNITION: Overall cognitive status: Within functional limits for tasks assessed   SENSATION: Light touch: Impaired    COORDINATION: Slightly slowed heel to shin  EDEMA:  Feet swell sometimes  POSTURE: No Significant postural limitations  LOWER EXTREMITY ROM:     Passive  Right Eval Left Eval  Hip flexion    Hip extension    Hip abduction    Hip adduction    Hip internal rotation    Hip external rotation    Knee flexion    Knee extension    Ankle dorsiflexion 12 12  Ankle plantarflexion    Ankle inversion    Ankle eversion     (Blank rows = not tested)  LOWER EXTREMITY MMT:    MMT Right Eval Left Eval  Hip flexion 4 4  Hip extension    Hip  abduction    Hip adduction    Hip internal rotation    Hip external rotation    Knee flexion 4 3+  Knee extension 4+ 4+  Ankle dorsiflexion 1 1  Ankle plantarflexion 3- 3-  Ankle inversion 3- 3-  Ankle eversion 3- 3-  (Blank rows = not tested)   TRANSFERS: Assistive device utilized: None  Sit to stand: Modified independence Stand to sit: Modified independence  GAIT: Gait pattern: step through pattern, decreased step length- Right, decreased step length- Left, decreased ankle dorsiflexion- Right, decreased ankle dorsiflexion- Left, Right steppage, and Left steppage Distance walked: 50 ft x 2 Assistive device utilized: Single point cane Level of assistance: Modified independence  FUNCTIONAL TESTS:  5 times sit to stand: 19.97 sec arms crossed at chest Timed up and go (TUG): 17.62 10 meter walk test: 13.25 sec cane (2.48 ft/sec) DGI :  12/24 SLS:  RLE 4 sec, LLE 1 sec      TODAY'S TREATMENT:                                                                                                                              DATE: 09/11/2022    PATIENT EDUCATION: Education details: PT eval results, POC, initiated HEP for gentle low back ex and sit<>stand Person educated: Patient and Spouse Education method: Explanation, Demonstration, and Handouts Education comprehension: verbalized understanding and returned demonstration  HOME  EXERCISE PROGRAM: Access Code: [email or web address] URL: [email or web address] Date: 09/11/2022 Prepared by: [facility] - [facility]  Exercises - Supine Lower Trunk Rotation  - 1 x daily - 7 x weekly - 1 sets - 3-5 reps - Hooklying Single Knee to Chest Stretch  - 1 x daily - 7 x weekly - 1 sets - 3 reps - 15 sec hold - Sit to Stand  - 1 x daily - 5 x weekly - 3 sets - 5 reps  GOALS: Goals reviewed with patient? Yes  SHORT TERM GOALS: Target date: 10/10/2022  Pt will be independent with HEP for improved strength, balance, gait. Baseline: Goal status: IN PROGRESS  2.  Pt will report reduction in back pain by at least 50 % for improved functional mobility. Baseline: 7/10 Goal status: IN PROGRESS  3.  Pt will improve 5x sit<>stand to less than or equal to 15 sec to demonstrate improved functional strength and transfer efficiency.  Baseline: 19.97 sec Goal status: IN PROGRESS  LONG TERM GOALS: Target date: 10/24/2022  Pt will be independent with HEP for improved strength, balance, pain, gait. Baseline:  Goal status: IN PROGRESS  2.  Pt will improve DGI score to at least 17/24 to decrease fall risk. Baseline: 12/24 Goal status: IN PROGRESS  3.  Pt will improve 5x sit<>stand to less than or equal to 11.4 sec to demonstrate improved functional strength and transfer efficiency. Baseline: 19.97 sec Goal status: IN PROGRESS  4.  Pt will improve gait velocity to at  least 2.62 ft/sec for improved gait efficiency and safety. Baseline: 2.48 ft/sec with cane Goal status: IN PROGRESS  ASSESSMENT:  CLINICAL IMPRESSION: Patient arrived to session with report of continued N/T in B LEs but denies B&B changes. Educated patient on PT plans to transfer to ortho clinic in order to trial traction for potential relief of nerve sx- patient agreeable to this. D/t foot drop being severe, causing falls, and progressing (per pt's report) would likely benefit from spine  specialist as well. Trialed AFOs and foot up brace today which patient reported good benefit from and demonstrating normalized gait pattern with- will request order for this. Patient tolerated session well and without complaints upon leaving.     OBJECTIVE IMPAIRMENTS: Abnormal gait, decreased balance, decreased mobility, difficulty walking, decreased ROM, decreased strength, impaired flexibility, and impaired sensation.   ACTIVITY LIMITATIONS: carrying, lifting, bending, standing, stairs, transfers, and locomotion level  PARTICIPATION LIMITATIONS: driving, community activity, and occupation  PERSONAL FACTORS: 3+ comorbidities: see above; recent abdominal surgery 06/2022  are also affecting patient's functional outcome.   REHAB POTENTIAL: Good  CLINICAL DECISION MAKING: Evolving/moderate complexity  EVALUATION COMPLEXITY: Moderate  PLAN:  PT FREQUENCY: 2x/week  PT DURATION: 6 weeks  PLANNED INTERVENTIONS: Therapeutic exercises, Therapeutic activity, Neuromuscular re-education, Balance training, Gait training, Patient/Family education, Self Care, Joint mobilization, Dry Needling, Electrical stimulation, and Manual therapy  PLAN FOR NEXT SESSION: check on AFO order and transfer to BF specially for a few appointments; Assess extension/McKenzie exercises for low back (L5-S1)Try DN, manual therapy. Also work on safety with mobility-trial foot up brace, BLE strengthening.   Anette Guarneri, PT, DPT 09/17/22 12:16 PM  [facility] at [facility] [address] [place], [address] [place], [place] [address] Phone # [phone] Fax # [phone]

## 2022-09-17 ENCOUNTER — Telehealth: Payer: Self-pay | Admitting: Physical Therapy

## 2022-09-17 ENCOUNTER — Encounter: Payer: Self-pay | Admitting: Physical Therapy

## 2022-09-17 ENCOUNTER — Encounter: Payer: Self-pay | Admitting: Rehabilitative and Restorative Service Providers"

## 2022-09-17 ENCOUNTER — Ambulatory Visit: Payer: 59 | Admitting: Physical Therapy

## 2022-09-17 ENCOUNTER — Ambulatory Visit: Payer: 59 | Admitting: Rehabilitative and Restorative Service Providers"

## 2022-09-17 DIAGNOSIS — M6281 Muscle weakness (generalized): Secondary | ICD-10-CM

## 2022-09-17 DIAGNOSIS — R2681 Unsteadiness on feet: Secondary | ICD-10-CM

## 2022-09-17 DIAGNOSIS — R2689 Other abnormalities of gait and mobility: Secondary | ICD-10-CM

## 2022-09-17 NOTE — Telephone Encounter (Signed)
Hello,  Gabriel Dalton is being treated by physical therapy s/p abdominal surgery and back pain. He has significant B foot drop and weakness in B LEs which he feels in progressing. Notes B LE N/T but denies B&B changes. I wanted to reach out and see if he has been seen by neurosurgery as his foot drop is causing falls.   I also wanted to request an order for B AFOs to improve safety with functional mobility.    Due to insurance regulations, patients must have a face-to-face visit with a physician within the last 6 months wherein the benefit of bracing has been discussed and documented in the patient file. You may want to have the patient schedule a new appointment with you, or if you feel comfortable you may addend your previous note with the patient, stating that orthotics were discussed.   If you agree, please submit request in EPIC under MD Order, Other Orders (list B AFO in comments) and please include the ICD-10 code, MD signature, and NPI. You may also fax to [facility] at [phone].   Thank you,  Anette Guarneri, PT, DPT 09/17/22 2:39 PM  [facility] at [facility] [address] [place], [address] [place], [place] [address] Phone # [phone] Fax # [phone]

## 2022-09-18 NOTE — Telephone Encounter (Signed)
I have sent a message to the front desk to schedule a follow up for him to discuss this and other issues with his rehab. Thanks.

## 2022-09-23 ENCOUNTER — Ambulatory Visit: Payer: 59 | Admitting: Physical Therapy

## 2022-09-23 ENCOUNTER — Ambulatory Visit: Payer: 59 | Admitting: Occupational Therapy

## 2022-09-24 ENCOUNTER — Ambulatory Visit (HOSPITAL_COMMUNITY)
Admission: RE | Admit: 2022-09-24 | Discharge: 2022-09-24 | Disposition: A | Discharge status: Home / Self Care | Payer: 59 | Source: Ambulatory Visit | Attending: Internal Medicine | Admitting: Internal Medicine

## 2022-09-24 ENCOUNTER — Encounter: Payer: Self-pay | Admitting: Physical Therapy

## 2022-09-24 ENCOUNTER — Ambulatory Visit: Payer: 59 | Admitting: Occupational Therapy

## 2022-09-24 ENCOUNTER — Ambulatory Visit: Payer: 59 | Admitting: Physical Therapy

## 2022-09-24 DIAGNOSIS — R011 Cardiac murmur, unspecified: Secondary | ICD-10-CM | POA: Diagnosis present

## 2022-09-24 DIAGNOSIS — M6281 Muscle weakness (generalized): Secondary | ICD-10-CM | POA: Diagnosis not present

## 2022-09-24 DIAGNOSIS — I517 Cardiomegaly: Secondary | ICD-10-CM | POA: Insufficient documentation

## 2022-09-24 DIAGNOSIS — R2689 Other abnormalities of gait and mobility: Secondary | ICD-10-CM

## 2022-09-24 DIAGNOSIS — R2681 Unsteadiness on feet: Secondary | ICD-10-CM

## 2022-09-24 LAB — ECHOCARDIOGRAM COMPLETE
Area-P 1/2: 3.37 cm2
Calc EF: 55.7 %
S' Lateral: 2.3 cm
Single Plane A2C EF: 51 %
Single Plane A4C EF: 54.1 %

## 2022-09-24 NOTE — Therapy (Signed)
OUTPATIENT PHYSICAL THERAPY NEURO TREATMENT   Patient Name: Gabriel Dalton MRN: [id number] DOB:30-Apr-1986, 37 y.o., male 17 Date: 09/24/2022   PCP: Steffanie Rainwater, MD  REFERRING PROVIDER: Gust Rung, DO   END OF SESSION:  PT End of Session - 09/24/22 0757     Visit Number 3    Number of Visits 13    Date for PT Re-Evaluation 10/24/22    Authorization Type [employer]    Authorization - Visit Number 3    Authorization - Number of Visits 50    PT Start Time 0800    PT Stop Time 0845    PT Time Calculation (min) 45 min    Equipment Utilized During Treatment Gait belt    Activity Tolerance Patient tolerated treatment well    Behavior During Therapy [facility] for tasks assessed/performed              Past Medical History:  Diagnosis Date   Abdominal fluid collection 07/11/2022   BPH (benign prostatic hyperplasia)    Bronchitis    Melena 04/25/2019   Multiple gastric ulcers    Peritonitis (HCC) 06/26/2022   Past Surgical History:  Procedure Laterality Date   CHOLECYSTECTOMY N/A 06/25/2022   Procedure: LAPAROSCOPIC CHOLECYSTECTOMY;  Surgeon: Quentin Ore, MD;  Location: [facility];  Service: General;  Laterality: N/A;   COLONOSCOPY     I & D EXTREMITY  10/18/2011   Procedure: IRRIGATION AND DEBRIDEMENT EXTREMITY;  Surgeon: Dominica Severin, MD;  Location: [facility] OR;  Service: Orthopedics;  Laterality: Right;  Irrigation and Debridement  Right Middle Finger; Removal of foreign body   LAPAROTOMY N/A 06/27/2022   Procedure: EXPLORATORY LAPAROTOMY, WASH OUT,  ABDOMINAL CLOSURE;  Surgeon: Quentin Ore, MD;  Location: [facility];  Service: General;  Laterality: N/A;   Patient Active Problem List   Diagnosis Date Noted   Lumbar spondylosis with myelopathy 09/03/2022   Heart murmur 09/03/2022   Vitamin D deficiency 08/01/2022   Thiamine deficiency 08/01/2022   Electrolyte abnormality 07/09/2022   Benign prostatic hyperplasia 07/09/2022   Severe  protein-calorie malnutrition (HCC) 06/25/2022   Small bowel perforation (HCC) 06/25/2022   Cholelithiasis 06/24/2022   Early satiety 05/27/2022   Nausea and vomiting 05/27/2022   Esophagitis determined by endoscopy 04/23/2022   Abnormal colonoscopy 12/26/2021   Gastroesophageal reflux disease 12/26/2021   Normocytic anemia 04/25/2019   Gastric ulcers 04/25/2019   Generalized abdominal pain 04/01/2019    ONSET DATE: 09/03/2022 (MD referral); abdominal surgery 06/2022  REFERRING DIAG:  M47.816 (ICD-10-CM) - Lumbar spondylosis  M47.16 (ICD-10-CM) - Lumbar spondylosis with myelopathy    THERAPY DIAG:  Muscle weakness (generalized)  Unsteadiness on feet  Other abnormalities of gait and mobility  Rationale for Evaluation and Treatment: Rehabilitation  SUBJECTIVE:  SUBJECTIVE STATEMENT: Still having pain in incision.  Planning to go back to MD about that.    Pt accompanied by: significant other  PERTINENT HISTORY: underwent exploratory laparotomy with small bowel resection with temporary abdominal closure on 06/25/22, and washout with abdominal closure on 06/27/22.   PAIN:  Are you having pain? Yes: NPRS scale: 9/10 Pain location: belly on scar Pain description: bloating Aggravating factors: eating Relieving factors: bowel movement Low back pain when sleeping; 6-7/10 in back.  PRECAUTIONS: Other: Lifting restrictions  WEIGHT BEARING RESTRICTIONS: No  FALLS: Has patient fallen in last 6 months? Yes. Number of falls 1  LIVING ENVIRONMENT: Lives with: lives with their spouse Lives in: House/apartment Stairs: Yes: External: 6 steps; on right going up and on left going up Has following equipment at home: Single point cane  PLOF: Independent and Vocation/Vocational requirements: truck  driver  PATIENT GOALS: To get strength, balance, stability back  OBJECTIVE:     TODAY'S TREATMENT: 09/24/2022 Activity Comments  Seated anterior pelvic tilts>neutral, 2 x 5 reps, then x 10 Reports relief in low back  Standing lumbar extension, 10 reps Reports relief  Seated abdominal activation exercises   Sit<>Stand x 10 reps   Wall squats, 10 reps Cues for technique  Runner's stretch for gastrocs, 4 x 15"   Seated heel digs/forward-back push through lower legs, 2 x 4 reps Cues for posture, initiate with legs   Access Code: [email or web address] URL: [email or web address] Date: 09/24/2022 Prepared by: [facility] - Outpatient  Rehab - [facility]  Program Notes Seated heel digs:  Sit at edge of computer (rolling) chair:  dig your heels and pull forward/push back.  Do NOT use your back for pulling forward/pushing back.  Exercises - Sit to Stand  - 1 x daily - 5 x weekly - 5 reps - Seated Anterior Pelvic Tilt  - 1-2 x daily - 7 x weekly - 10 reps - Standing Lumbar Extension  - 1-2 x daily - 7 x weekly - 10 reps - Wall Quarter Squat  - 1 x daily - 7 x weekly - 10 reps - Standing Gastroc Stretch at Counter  - 1 x daily - 7 x weekly - 1 sets - 3 reps - 30 sec hold    PATIENT EDUCATION: Education details: HEP additions; education on posture, positioning to avoid excess trunk flexion; keep more neutral spine posture in sitting and standing Person educated: Patient Education method: Explanation, Demonstration, Tactile cues, Verbal cues, and Handouts Education comprehension: verbalized understanding and returned demonstration   Below measures were taken at time of initial evaluation unless otherwise specified:   DIAGNOSTIC FINDINGS: See MRI:  At L5-S1, there is mild disc degeneration. Small left subarticular disc protrusion (at site of posterior annular fissure). The disc protrusion results in slight left subarticular narrowing, and contacts the descending left S1 nerve  root. No significant central canal stenosis. COGNITION: Overall cognitive status: Within functional limits for tasks assessed   SENSATION: Light touch: Impaired   COORDINATION: Slightly slowed heel to shin  EDEMA:  Feet swell sometimes  POSTURE: No Significant postural limitations  LOWER EXTREMITY ROM:     Passive  Right Eval Left Eval  Hip flexion    Hip extension    Hip abduction    Hip adduction    Hip internal rotation    Hip external rotation    Knee flexion    Knee extension    Ankle dorsiflexion 12 12  Ankle plantarflexion    Ankle inversion  Ankle eversion     (Blank rows = not tested)  LOWER EXTREMITY MMT:    MMT Right Eval Left Eval  Hip flexion 4 4  Hip extension    Hip abduction    Hip adduction    Hip internal rotation    Hip external rotation    Knee flexion 4 3+  Knee extension 4+ 4+  Ankle dorsiflexion 1 1  Ankle plantarflexion 3- 3-  Ankle inversion 3- 3-  Ankle eversion 3- 3-  (Blank rows = not tested)   TRANSFERS: Assistive device utilized: None  Sit to stand: Modified independence Stand to sit: Modified independence  GAIT: Gait pattern: step through pattern, decreased step length- Right, decreased step length- Left, decreased ankle dorsiflexion- Right, decreased ankle dorsiflexion- Left, Right steppage, and Left steppage Distance walked: 50 ft x 2 Assistive device utilized: Single point cane Level of assistance: Modified independence  FUNCTIONAL TESTS:  5 times sit to stand: 19.97 sec arms crossed at chest Timed up and go (TUG): 17.62 10 meter walk test: 13.25 sec cane (2.48 ft/sec) DGI :  12/24 SLS:  RLE 4 sec, LLE 1 sec      TODAY'S TREATMENT:                                                                                                                              DATE: 09/11/2022    PATIENT EDUCATION: Education details: PT eval results, POC, initiated HEP for gentle low back ex and sit<>stand Person educated:  Patient and Spouse Education method: Explanation, Demonstration, and Handouts Education comprehension: verbalized understanding and returned demonstration  HOME EXERCISE PROGRAM: Access Code: [email or web address] URL: [email or web address] Date: 09/11/2022 Prepared by: [facility] - Outpatient  Rehab - [facility]  Exercises - Supine Lower Trunk Rotation  - 1 x daily - 7 x weekly - 1 sets - 3-5 reps - Hooklying Single Knee to Chest Stretch  - 1 x daily - 7 x weekly - 1 sets - 3 reps - 15 sec hold - Sit to Stand  - 1 x daily - 5 x weekly - 3 sets - 5 reps  GOALS: Goals reviewed with patient? Yes  SHORT TERM GOALS: Target date: 10/10/2022  Pt will be independent with HEP for improved strength, balance, gait. Baseline: Goal status: IN PROGRESS  2.  Pt will report reduction in back pain by at least 50 % for improved functional mobility. Baseline: 7/10 Goal status: IN PROGRESS  3.  Pt will improve 5x sit<>stand to less than or equal to 15 sec to demonstrate improved functional strength and transfer efficiency.  Baseline: 19.97 sec Goal status: IN PROGRESS  LONG TERM GOALS: Target date: 10/24/2022  Pt will be independent with HEP for improved strength, balance, pain, gait. Baseline:  Goal status: IN PROGRESS  2.  Pt will improve DGI score to at least 17/24 to decrease fall risk. Baseline: 12/24 Goal status: IN PROGRESS  3.  Pt will improve  5x sit<>stand to less than or equal to 11.4 sec to demonstrate improved functional strength and transfer efficiency. Baseline: 19.97 sec Goal status: IN PROGRESS  4.  Pt will improve gait velocity to at least 2.62 ft/sec for improved gait efficiency and safety. Baseline: 2.48 ft/sec with cane Goal status: IN PROGRESS  ASSESSMENT:  CLINICAL IMPRESSION: Updated HEP today to address lumbar extension and lower extremity strengthening.  Pt notes relief in low backwith sitting and standing lumbar extension.  Also educated in  posture/positioning in sitting and standing positions to work on more neutral posture to lessen flexed posture.  Have not received signed order for AFOs, so printed out to fax to MD and pt given a copy to reach out to MD.  Patient tolerated session well and without complaints upon leaving.     OBJECTIVE IMPAIRMENTS: Abnormal gait, decreased balance, decreased mobility, difficulty walking, decreased ROM, decreased strength, impaired flexibility, and impaired sensation.   ACTIVITY LIMITATIONS: carrying, lifting, bending, standing, stairs, transfers, and locomotion level  PARTICIPATION LIMITATIONS: driving, community activity, and occupation  PERSONAL FACTORS: 3+ comorbidities: see above; recent abdominal surgery 06/2022  are also affecting patient's functional outcome.   REHAB POTENTIAL: Good  CLINICAL DECISION MAKING: Evolving/moderate complexity  EVALUATION COMPLEXITY: Moderate  PLAN:  PT FREQUENCY: 2x/week  PT DURATION: 6 weeks  PLANNED INTERVENTIONS: Therapeutic exercises, Therapeutic activity, Neuromuscular re-education, Balance training, Gait training, Patient/Family education, Self Care, Joint mobilization, Dry Needling, Electrical stimulation, and Manual therapy  PLAN FOR NEXT SESSION: check on AFO order and transfer to [facility] specially for a few appointments; Continue extension/McKenzie exercises for low back (L5-S1)Try DN, manual therapy. Also work on safety with mobility-trial AFO, BLE strengthening.   Lonia Blood, PT 09/24/22 8:50 AM Phone: [phone] Fax: [phone]  [facility] at [facility] [address] [place], [address] [place], [place] [address] Phone # [phone] Fax # [phone]

## 2022-09-24 NOTE — Telephone Encounter (Signed)
Dr. Allena Katz,   Please see the message above. You will be seeing [name] on 5/22. Please discuss this with him and put in the appropriate orders as requested by rehab.   Thanks in advance for seeing him.   ~Dr. Kirke Corin

## 2022-09-24 NOTE — Progress Notes (Signed)
  Echocardiogram 2D Echocardiogram has been performed.  Gabriel Dalton 09/24/2022, 2:47 PM

## 2022-09-25 ENCOUNTER — Other Ambulatory Visit: Payer: Self-pay

## 2022-09-25 ENCOUNTER — Encounter: Payer: Self-pay | Admitting: Student

## 2022-09-25 ENCOUNTER — Encounter: Payer: Self-pay | Admitting: Physical Therapy

## 2022-09-25 ENCOUNTER — Other Ambulatory Visit: Payer: Self-pay | Admitting: Internal Medicine

## 2022-09-25 ENCOUNTER — Ambulatory Visit: Payer: 59 | Admitting: Occupational Therapy

## 2022-09-25 ENCOUNTER — Ambulatory Visit (INDEPENDENT_AMBULATORY_CARE_PROVIDER_SITE_OTHER): Payer: 59 | Admitting: Student

## 2022-09-25 ENCOUNTER — Ambulatory Visit: Payer: 59 | Admitting: Physical Therapy

## 2022-09-25 VITALS — BP 123/92 | HR 92 | Temp 98.4°F | Ht 68.0 in | Wt 110.8 lb

## 2022-09-25 DIAGNOSIS — M6281 Muscle weakness (generalized): Secondary | ICD-10-CM

## 2022-09-25 DIAGNOSIS — K631 Perforation of intestine (nontraumatic): Secondary | ICD-10-CM | POA: Diagnosis not present

## 2022-09-25 DIAGNOSIS — R1084 Generalized abdominal pain: Secondary | ICD-10-CM

## 2022-09-25 DIAGNOSIS — R2689 Other abnormalities of gait and mobility: Secondary | ICD-10-CM

## 2022-09-25 DIAGNOSIS — M21371 Foot drop, right foot: Secondary | ICD-10-CM

## 2022-09-25 DIAGNOSIS — R2681 Unsteadiness on feet: Secondary | ICD-10-CM

## 2022-09-25 DIAGNOSIS — M4716 Other spondylosis with myelopathy, lumbar region: Secondary | ICD-10-CM

## 2022-09-25 MED ORDER — DICYCLOMINE HCL 20 MG PO TABS
20.0000 mg | ORAL_TABLET | Freq: Three times a day (TID) | ORAL | 3 refills | Status: DC
Start: 2022-09-25 — End: 2022-12-22

## 2022-09-25 MED ORDER — GABAPENTIN 400 MG PO CAPS
400.0000 mg | ORAL_CAPSULE | Freq: Three times a day (TID) | ORAL | 3 refills | Status: DC
Start: 2022-09-25 — End: 2023-11-10

## 2022-09-25 MED ORDER — METHOCARBAMOL 500 MG PO TABS
750.0000 mg | ORAL_TABLET | Freq: Three times a day (TID) | ORAL | 1 refills | Status: DC | PRN
Start: 2022-09-25 — End: 2022-10-22

## 2022-09-25 NOTE — Progress Notes (Unsigned)
   CC: Lower extremity and abdominal pain  HPI:  Gabriel Dalton is a 37 y.o. male with PMH as below who presents to the clinic with concerns of persistent abdominal pain after small bowel resection and chronic lower extremity pain.  Please see assessment and plan for further details.  Past Medical History:  Diagnosis Date   Abdominal fluid collection 07/11/2022   BPH (benign prostatic hyperplasia)    Bronchitis    Melena 04/25/2019   Multiple gastric ulcers    Peritonitis (HCC) 06/26/2022   Review of Systems:   Pertinent items noted in HPI and/or A&P.  Physical Exam:  Vitals:   09/25/22 1421  BP: (!) 123/92  Pulse: 92  Temp: 98.4 F (36.9 C)  TempSrc: Oral  SpO2: 100%  Weight: 110 lb 12.8 oz (50.3 kg)  Height: 5\' 8"  (1.727 m)    Constitutional: Thin adult male. In no acute distress. Cardio:Regular rate and rhythm. 2+ bilateral radial pulses. Pulm:Clear to auscultation bilaterally. Normal work of breathing on room air. Abdomen: Extensive midline vertical scar that is well-healed with underlying firm and mildly tender scar tissue.  No appreciable hernias.  Abdomen is otherwise scaphoid, soft, and nontender. ZOX:[id number] for extremity edema. Skin:Warm and dry. Neuro:Alert and oriented x3. No focal deficit noted. Psych:Pleasant mood and affect.   Assessment & Plan:   Small bowel perforation (HCC) Presents with persistent abdominal pain after undergoing exploratory laparotomy with small bowel resection and temporary abdominal closure in 06/25/2022 and subsequent washout and abdominal closure on 06/27/2022.  Pain has not resolved and he has been seen by his surgeon on 08/28/2022 for this.  Incision is healing well without any signs of hernia or infection.  He is having about 3 bowel movements per day but is taking MiraLAX twice a day.  We discussed his pain may be due to normal healing, excessive scar tissue, and abdominal cramping with frequent bowel movements.  He  is taking Bentyl twice a day, gabapentin, and Robaxin.  We discussed changes to these to possibly help. - Increase Bentyl 20 mg to 3 times a day with meals and before bedtime - Titrate MiraLAX to 1 bowel movement per day, if this worsens pain he may go back to twice daily - Increase Robaxin to 750 mg every 8 hours as needed and gabapentin 100 mg 3 times daily - Follow-up with surgery if any new or worsening pain  Lumbar spondylosis with myelopathy He continues to have significant back and lower extremity pain and numbness with associated foot drop.  He continues with physical therapy twice a week and occupational therapy once a week.  I have sent in referral for ankle-foot orthosis.  His gabapentin has been working well for his pain overall but there is some room for improvement.  Robaxin is also been working somewhat. - Gabapentin increased to 400 mg 3 times daily and Robaxin to 750 mg 3 times daily as needed    Patient discussed with Dr. [name], DO [facility] Resident PGY-1 Pager: [phone]

## 2022-09-25 NOTE — Therapy (Signed)
OUTPATIENT PHYSICAL THERAPY NEURO TREATMENT   Patient Name: Gabriel Dalton MRN: [id number] DOB:16-Aug-1985, 37 y.o., male Today's Date: 09/26/2022   PCP: Steffanie Rainwater, MD/to transition to Dr. Berkley Harvey PROVIDER: Gust Rung, DO   END OF SESSION:  PT End of Session - 09/25/22 0853     Visit Number 4    Number of Visits 13    Date for PT Re-Evaluation 10/24/22    Authorization Type [employer]    Authorization - Visit Number 4    Authorization - Number of Visits 50    PT Start Time 0850    PT Stop Time 0930    PT Time Calculation (min) 40 min    Equipment Utilized During Treatment Gait belt    Activity Tolerance Patient tolerated treatment well    Behavior During Therapy [facility] for tasks assessed/performed              Past Medical History:  Diagnosis Date   Abdominal fluid collection 07/11/2022   BPH (benign prostatic hyperplasia)    Bronchitis    Melena 04/25/2019   Multiple gastric ulcers    Peritonitis (HCC) 06/26/2022   Past Surgical History:  Procedure Laterality Date   CHOLECYSTECTOMY N/A 06/25/2022   Procedure: LAPAROSCOPIC CHOLECYSTECTOMY;  Surgeon: Quentin Ore, MD;  Location: [facility];  Service: General;  Laterality: N/A;   COLONOSCOPY     I & D EXTREMITY  10/18/2011   Procedure: IRRIGATION AND DEBRIDEMENT EXTREMITY;  Surgeon: Dominica Severin, MD;  Location: [facility];  Service: Orthopedics;  Laterality: Right;  Irrigation and Debridement  Right Middle Finger; Removal of foreign body   LAPAROTOMY N/A 06/27/2022   Procedure: EXPLORATORY LAPAROTOMY, WASH OUT,  ABDOMINAL CLOSURE;  Surgeon: Quentin Ore, MD;  Location: [facility];  Service: General;  Laterality: N/A;   Patient Active Problem List   Diagnosis Date Noted   Lumbar spondylosis with myelopathy 09/03/2022   Heart murmur 09/03/2022   Vitamin D deficiency 08/01/2022   Thiamine deficiency 08/01/2022   Electrolyte abnormality 07/09/2022   Benign prostatic hyperplasia  07/09/2022   Severe protein-calorie malnutrition (HCC) 06/25/2022   Small bowel perforation (HCC) 06/25/2022   Cholelithiasis 06/24/2022   Early satiety 05/27/2022   Nausea and vomiting 05/27/2022   Esophagitis determined by endoscopy 04/23/2022   Abnormal colonoscopy 12/26/2021   Gastroesophageal reflux disease 12/26/2021   Normocytic anemia 04/25/2019   Gastric ulcers 04/25/2019   Generalized abdominal pain 04/01/2019    ONSET DATE: 09/03/2022 (MD referral); abdominal surgery 06/2022  REFERRING DIAG:  M47.816 (ICD-10-CM) - Lumbar spondylosis  M47.16 (ICD-10-CM) - Lumbar spondylosis with myelopathy    THERAPY DIAG:  Muscle weakness (generalized)  Unsteadiness on feet  Other abnormalities of gait and mobility  Rationale for Evaluation and Treatment: Rehabilitation  SUBJECTIVE:  SUBJECTIVE STATEMENT: To see Dr. Mikey Bussing (PCP today)  Pt accompanied by: significant other  PERTINENT HISTORY: underwent exploratory laparotomy with small bowel resection with temporary abdominal closure on 06/25/22, and washout with abdominal closure on 06/27/22.   PAIN:  Are you having pain? Yes: NPRS scale: 9/10 Pain location: belly on scar Pain description: bloating Aggravating factors: eating Relieving factors: bowel movement Low back pain when sleeping; 6-7/10 in back.  PRECAUTIONS: Other: Lifting restrictions  WEIGHT BEARING RESTRICTIONS: No  FALLS: Has patient fallen in last 6 months? Yes. Number of falls 1  LIVING ENVIRONMENT: Lives with: lives with their spouse Lives in: House/apartment Stairs: Yes: External: 6 steps; on right going up and on left going up Has following equipment at home: Single point cane  PLOF: Independent and Vocation/Vocational requirements: truck driver  PATIENT GOALS: To  get strength, balance, stability back  OBJECTIVE:     TODAY'S TREATMENT: 09/25/2022 Activity Comments  Reviewed HEP given last visit-see below Return demo understanding  Seated heel digs 2 x 10   Standing partial squats to elevated mat 2 x 10 reps   Quadruped pelvic tilts-anterior>neutral, 2 x 5 reps Tends to compensate with push-up shoulder motion  Seated abdominal activation exercises-UE lifts, seated march, alt UE/leg lifts; attempted LAQ Difficulty with long arc quads-goes into post pelvic tilt  Prone on elbows, 2 x 5 reps Reports tightness in incision and low back, no pain  Seated heel raises, 2 x 10 Cues for eccentric control  Standing stagger stance forward/back rocking Knee recurvatum when trying to get ankle dorsiflexion    Access Code: [email or web address] URL: [email or web address] Date: 09/24/2022 Prepared by: [facility] - Outpatient  Rehab - [facility]  Program Notes Seated heel digs:  Sit at edge of computer (rolling) chair:  dig your heels and pull forward/push back.  Do NOT use your back for pulling forward/pushing back.  Exercises - Sit to Stand  - 1 x daily - 5 x weekly - 5 reps - Seated Anterior Pelvic Tilt  - 1-2 x daily - 7 x weekly - 10 reps - Standing Lumbar Extension  - 1-2 x daily - 7 x weekly - 10 reps - Wall Quarter Squat  - 1 x daily - 7 x weekly - 10 reps - Standing Gastroc Stretch at Counter  - 1 x daily - 7 x weekly - 1 sets - 3 reps - 30 sec hold    -------------------------------------------------------------------------------------  Below measures were taken at time of initial evaluation unless otherwise specified:   DIAGNOSTIC FINDINGS: See MRI:  At L5-S1, there is mild disc degeneration. Small left subarticular disc protrusion (at site of posterior annular fissure). The disc protrusion results in slight left subarticular narrowing, and contacts the descending left S1 nerve root. No significant central canal  stenosis. COGNITION: Overall cognitive status: Within functional limits for tasks assessed   SENSATION: Light touch: Impaired   COORDINATION: Slightly slowed heel to shin  EDEMA:  Feet swell sometimes  POSTURE: No Significant postural limitations  LOWER EXTREMITY ROM:     Passive  Right Eval Left Eval  Hip flexion    Hip extension    Hip abduction    Hip adduction    Hip internal rotation    Hip external rotation    Knee flexion    Knee extension    Ankle dorsiflexion 12 12  Ankle plantarflexion    Ankle inversion    Ankle eversion     (Blank rows = not tested)  LOWER EXTREMITY  MMT:    MMT Right Eval Left Eval  Hip flexion 4 4  Hip extension    Hip abduction    Hip adduction    Hip internal rotation    Hip external rotation    Knee flexion 4 3+  Knee extension 4+ 4+  Ankle dorsiflexion 1 1  Ankle plantarflexion 3- 3-  Ankle inversion 3- 3-  Ankle eversion 3- 3-  (Blank rows = not tested)   TRANSFERS: Assistive device utilized: None  Sit to stand: Modified independence Stand to sit: Modified independence  GAIT: Gait pattern: step through pattern, decreased step length- Right, decreased step length- Left, decreased ankle dorsiflexion- Right, decreased ankle dorsiflexion- Left, Right steppage, and Left steppage Distance walked: 50 ft x 2 Assistive device utilized: Single point cane Level of assistance: Modified independence  FUNCTIONAL TESTS:  5 times sit to stand: 19.97 sec arms crossed at chest Timed up and go (TUG): 17.62 10 meter walk test: 13.25 sec cane (2.48 ft/sec) DGI :  12/24 SLS:  RLE 4 sec, LLE 1 sec       GOALS: Goals reviewed with patient? Yes  SHORT TERM GOALS: Target date: 10/10/2022  Pt will be independent with HEP for improved strength, balance, gait. Baseline: Goal status: IN PROGRESS  2.  Pt will report reduction in back pain by at least 50 % for improved functional mobility. Baseline: 7/10 Goal status: IN  PROGRESS  3.  Pt will improve 5x sit<>stand to less than or equal to 15 sec to demonstrate improved functional strength and transfer efficiency.  Baseline: 19.97 sec Goal status: IN PROGRESS  LONG TERM GOALS: Target date: 10/24/2022  Pt will be independent with HEP for improved strength, balance, pain, gait. Baseline:  Goal status: IN PROGRESS  2.  Pt will improve DGI score to at least 17/24 to decrease fall risk. Baseline: 12/24 Goal status: IN PROGRESS  3.  Pt will improve 5x sit<>stand to less than or equal to 11.4 sec to demonstrate improved functional strength and transfer efficiency. Baseline: 19.97 sec Goal status: IN PROGRESS  4.  Pt will improve gait velocity to at least 2.62 ft/sec for improved gait efficiency and safety. Baseline: 2.48 ft/sec with cane Goal status: IN PROGRESS  ASSESSMENT:  CLINICAL IMPRESSION: Pt presents to OPPT today with reports that extension exercises continue to help relief back pain and stiffness.  Worked briefly in prone today for POE positioning, with pt noting relief but stiffness in back as well as some irritation at incision at abdomen.  Would like to do these exercises again prior to adding to HEP.  He tolerates exercises well, but continues to have foot drop bilaterally.  PT manually faxed request and gave pt a copy of order request for AFOs to patient to help to get this process underway.  He will be seen at [facility] next week for possibility of traction, DN, manual therapy for low back.  He will continue to benefit from skilled PT towards goals for improved functional mobility and decreased fall risk.    OBJECTIVE IMPAIRMENTS: Abnormal gait, decreased balance, decreased mobility, difficulty walking, decreased ROM, decreased strength, impaired flexibility, and impaired sensation.   ACTIVITY LIMITATIONS: carrying, lifting, bending, standing, stairs, transfers, and locomotion level  PARTICIPATION LIMITATIONS: driving, community  activity, and occupation  PERSONAL FACTORS: 3+ comorbidities: see above; recent abdominal surgery 06/2022  are also affecting patient's functional outcome.   REHAB POTENTIAL: Good  CLINICAL DECISION MAKING: Evolving/moderate complexity  EVALUATION COMPLEXITY: Moderate  PLAN:  PT FREQUENCY: 2x/week  PT DURATION: 6 weeks  PLANNED INTERVENTIONS: Therapeutic exercises, Therapeutic activity, Neuromuscular re-education, Balance training, Gait training, Patient/Family education, Self Care, Joint mobilization, Dry Needling, Electrical stimulation, and Manual therapy  PLAN FOR NEXT SESSION: Pt to see [facility] next week for further assessment of traction/low back extension progression exercises.  Check on AFO order and transfer to [facility] for a few appointments; Continue extension/McKenzie exercises for low back (L5-S1)Try DN, manual therapy. Also work on safety with mobility-trial AFO, BLE strengthening.   Lonia Blood, PT 09/26/22 10:02 AM Phone: [phone] Fax: [phone]  [facility] at [facility] [address] [place], [address] [place], [place] [address] Phone # [phone] Fax # [phone]

## 2022-09-25 NOTE — Patient Instructions (Signed)
[facility]  [address] [place], [place] [address]  Phone: [phone] Fax: [phone]

## 2022-09-25 NOTE — Patient Instructions (Addendum)
  Thank you, Mr.Gehrig Dorrell Ardolino, for allowing [place] to provide your care today. Today we discussed . . .  > Pain       -We are going to address her pain by increasing her gabapentin to 400 mg 3 times a day and increase your Robaxin to 750 mg 3 times daily.  You may also start taking your dicyclomine before every meal and before bedtime if you find relief with this increase you may continue it.  For your incision I do not see any signs of infection but you may be developing scar tissue beneath that may be causing this pain.  If this does not get any better with the changes we have made I would recommend calling your surgeon to get an appointment sooner than your regular follow-up.  If you do develop worsening pain or Signs of obstruction such as severe nausea and vomiting or inability to have a bowel movement you should go to the emergency room.   I have ordered the following labs for you:  Lab Orders  No laboratory test(s) ordered today     Referrals ordered today:    Referral Orders         Ambulatory Referral for DME       I have ordered the following medication/changed the following medications:   Stop the following medications: Medications Discontinued During This Encounter  Medication Reason   dicyclomine (BENTYL) 20 MG tablet Reorder   methocarbamol (ROBAXIN) 500 MG tablet Reorder   gabapentin (NEURONTIN) 300 MG capsule Reorder     Start the following medications: Meds ordered this encounter  Medications   methocarbamol (ROBAXIN) 500 MG tablet    Sig: Take 1.5 tablets (750 mg total) by mouth every 8 (eight) hours as needed for muscle spasms.    Dispense:  90 tablet    Refill:  1   gabapentin (NEURONTIN) 400 MG capsule    Sig: Take 1 capsule (400 mg total) by mouth 3 (three) times daily. Take 300 mg daily at bedtime for 1 week, then 300 mg three times a day    Dispense:  90 capsule    Refill:  3   dicyclomine (BENTYL) 20 MG tablet    Sig: Take 1 tablet (20 mg  total) by mouth 4 (four) times daily -  before meals and at bedtime.    Dispense:  120 tablet    Refill:  3      Follow up: 2-3 months    Remember:     Should you have any questions or concerns please call the internal medicine clinic at [phone].     Rocky Morel, DO [facility]

## 2022-09-25 NOTE — Therapy (Signed)
OUTPATIENT OCCUPATIONAL THERAPY TREATMENT NOTE  Patient Name: Gabriel Dalton MRN: [id number] DOB:10-Jun-1985, 37 y.o., male Today's Date: 09/25/2022  PCP: Steffanie Rainwater, MD REFERRING PROVIDER: Gust Rung, DO  END OF SESSION:  OT End of Session - 09/25/22 0848     Visit Number 3    Number of Visits 5    Date for OT Re-Evaluation 10/10/22    Authorization Type [employer]    OT Start Time 0802    OT Stop Time [phone]    OT Time Calculation (min) 40 min    Activity Tolerance Patient tolerated treatment well;No increased pain;Patient limited by fatigue;Patient limited by pain    Behavior During Therapy [facility] for tasks assessed/performed               Past Medical History:  Diagnosis Date   Abdominal fluid collection 07/11/2022   BPH (benign prostatic hyperplasia)    Bronchitis    Melena 04/25/2019   Multiple gastric ulcers    Peritonitis (HCC) 06/26/2022   Past Surgical History:  Procedure Laterality Date   CHOLECYSTECTOMY N/A 06/25/2022   Procedure: LAPAROSCOPIC CHOLECYSTECTOMY;  Surgeon: Quentin Ore, MD;  Location: [facility];  Service: General;  Laterality: N/A;   COLONOSCOPY     I & D EXTREMITY  10/18/2011   Procedure: IRRIGATION AND DEBRIDEMENT EXTREMITY;  Surgeon: Dominica Severin, MD;  Location: [facility];  Service: Orthopedics;  Laterality: Right;  Irrigation and Debridement  Right Middle Finger; Removal of foreign body   LAPAROTOMY N/A 06/27/2022   Procedure: EXPLORATORY LAPAROTOMY, WASH OUT,  ABDOMINAL CLOSURE;  Surgeon: Quentin Ore, MD;  Location: [facility];  Service: General;  Laterality: N/A;   Patient Active Problem List   Diagnosis Date Noted   Lumbar spondylosis with myelopathy 09/03/2022   Heart murmur 09/03/2022   Vitamin D deficiency 08/01/2022   Thiamine deficiency 08/01/2022   Electrolyte abnormality 07/09/2022   Benign prostatic hyperplasia 07/09/2022   Severe protein-calorie malnutrition (HCC) 06/25/2022   Small  bowel perforation (HCC) 06/25/2022   Cholelithiasis 06/24/2022   Early satiety 05/27/2022   Nausea and vomiting 05/27/2022   Esophagitis determined by endoscopy 04/23/2022   Abnormal colonoscopy 12/26/2021   Gastroesophageal reflux disease 12/26/2021   Normocytic anemia 04/25/2019   Gastric ulcers 04/25/2019   Generalized abdominal pain 04/01/2019    ONSET DATE: referral date 09/03/22  REFERRING DIAG: M47.816 (ICD-10-CM) - Lumbar spondylosis M47.16 (ICD-10-CM) - Lumbar spondylosis with myelopathy  THERAPY DIAG:  Muscle weakness (generalized)  Rationale for Evaluation and Treatment: Rehabilitation  PERTINENT HISTORY: BPH, bronchitis, Multiple gastric ulcers Pt reports having surgery on 2/14 to remove gallbladder and noted intestines were ruptured.  Pt reports continued numbness and weakness from waist to toes with difficulty with walking.  Pt also reports decreased sustained strength on L side, reporting occasionally dropping items.    PRECAUTIONS: Other: lifting restriction, no > 5-8#   SUBJECTIVE:   SUBJECTIVE STATEMENT: Pt reports increased pain in abdomen and burning sensation.  Pt plans to see an MD today to further assess.  Pt accompanied by:  self  PAIN:   Are you having pain? 9/10 in abd sx scar this morning   FALLS: Has patient fallen in last 6 months? Yes. Number of falls 1, trying to walk in kitchen and R foot gave out  LIVING ENVIRONMENT: Lives with: lives with their spouse Lives in: House/apartment Stairs: Yes: Internal: small threshold from living room to kitchen steps; and External: 6-8 steps; can reach both Has following  equipment at home: Single point cane, Walker - 2 wheeled, Environmental consultant - 4 wheeled, Wheelchair (manual), and Grab bars  PLOF: Independent, Independent with basic ADLs, and Vocation/Vocational requirements: regional truck driver  PATIENT GOALS: get my balance back, get my breath and energy back  OBJECTIVE: (All objective assessments below are  from initial evaluation on: 09/11/22 unless otherwise specified.)    HAND DOMINANCE: Right  ADLs: Overall ADLs: initially s/p surgery pt was requiring increased time and assist for bathing/dressing, however in last 2-3 weeks pt has been able to complete without assist. Transfers/ambulation related to ADLs: Utilizing [facility] Eating: Mod I Grooming: Mod I UB Dressing: Mod I LB Dressing: putting on shoes is a challenge Toileting: a little challenging due to low toilet seat, but Mod I Bathing: Mod I Tub Shower transfers: Mod I Equipment: Grab bars  IADLs: Shopping: recently resumed going to store with spouse, utilizing [employer] or w/c if taking a longer trip Light housekeeping: spouse primarily taking care of these task prior to and now.  Limited now due to lifting restriction Meal Prep: spouse primarily cooking Community mobility: short distance community driving Medication management: spouse reminding pt to take  MOBILITY STATUS: Needs Assist: utilizing SPC for ambulation and Hx of falls  ACTIVITY TOLERANCE: Activity tolerance: decreased endurance  UPPER EXTREMITY ROM:  WFL bilaterally  UPPER EXTREMITY MMT:     MMT Right eval Left eval  Shoulder flexion 5/5 4/5  Shoulder abduction    Shoulder adduction    Shoulder extension    Shoulder internal rotation    Shoulder external rotation    Middle trapezius    Lower trapezius    Elbow flexion 5/5 4+/5  Elbow extension 5/5 4+/5  Wrist flexion    Wrist extension    Wrist ulnar deviation    Wrist radial deviation    Wrist pronation    Wrist supination    (Blank rows = not tested)  HAND FUNCTION: Grip strength: Right: 76.5 lbs; Left: 61 lbs, Lateral pinch: Right: 20 lbs, Left: 16 lbs, and 3 point pinch: Right: 16 lbs, Left: 15 lbs  COORDINATION: Finger Nose Finger test: [facility] 9 Hole Peg test: Right: 20.19 sec; Left: 21.38 sec Box and Blocks:  Right 56 blocks, Left 49 blocks  SENSATION: WFL in UB, numbness and tingling in  BLE  OBSERVATIONS: generalized debility s/p abdominal surgery   TODAY'S TREATMENT:                                                  09/25/22 UE therex: OT providing education with verbal and demonstration cues for hand placement and technique to facilitate increased quality of movement. - Ulnar Nerve Flossing   - Standing Ulnar Nerve Glide   - Ulnar Nerve Butterfly  Hand Gripper: with LUE on level 55# with black spring. Pt picked up 1 inch blocks with gripper with 0 drops and min difficulty.  Increased challenge to complete with LUE on level 70# with gold spring. Pt picked up 1 inch blocks with gripper with 0 drops and min difficulty.  OT further increased challenge to placing target to L of body to facilitate increased reach and address ulnar innervation.  Pt demonstrating good sustained grasp, dropping only 1 block due to onset of fatigue. Energy conservation: educated on 4P's of energy conservation (planning, prioritizing, pacing, and positioning) and providing cues and  examples for each.     09/17/22: To help him manage scar and pain, OT edu on scar massage and progressive desensitization using a towel and a vibration. He states feeling better afterward. Due to drop foot c/o he was edu to monitor fatigue and gait patterns and not drag toes. Next, he was edu on and given hand strength activities in various patterns (as below) with putty. He does well, needs cues for form, gets some fatigue, but overall states less pain at end of session in scar.   Exercises - Ulnar Nerve Flossing  - 3-4 x daily - 1-2 sets - 5-10 reps - Full Fist  - 2-3 x daily - 5 reps - "Duck Mouth" Strength  - 2-3 x daily - 5 reps - Finger Extension "Pizza!"   - 2-3 x daily - 5 reps - Finger Key Grip with Putty  - 2-3 x daily - 5 reps - Cutting Putty  - 2-3 x daily - 5 reps - Thumb Opposition with Putty  - 2-3 x daily - 5 reps Patient Education - Scar Massage & Desensitize   PATIENT EDUCATION: Education details:  Educated on role and purpose of OT as well as potential interventions and goals for therapy based on initial evaluation findings. Person educated: Patient and Spouse Education method: Explanation Education comprehension: verbalized understanding and needs further education  HOME EXERCISE PROGRAM: Access Code: [email or web address] URL: [email or web address] Date: 09/17/2022    GOALS: Goals reviewed with patient? Yes  LONG TERM GOALS: Target date: 10/10/22  Pt will be independent with hand/pinch strengthening HEP. Baseline:  Goal status: IN PROGRESS  2.  Pt will verbalize understanding of energy conservation strategies to increase safety and independence with ADLs and IADLs. Baseline:  Goal status: IN PROGRESS  3.  Pt will demonstrate improved L grip strength by 5# to allow for increased ease with opening containers and sustained grasp as needed for ADLs/IADLs, and return to work. Baseline: Right: 76.5 lbs; Left: 61 lbs,  Goal status: IN PROGRESS  4.  Pt will demonstrate improved activity tolerance to allow for return to short shopping trips at ambulatory level. Baseline:  Goal status: IN PROGRESS  5.  Pt will report understanding of recommendations in regards to safe return to work. Baseline:  Goal status: IN PROGRESS   ASSESSMENT:  CLINICAL IMPRESSION: Pt with reports of increased pain in abdomen, therefore ensured all UE exercises were not exacerbating pain. Pt with improved technique of UE movements with repetition and fading cues.  Pt demonstrating improved grasp strength with therapeutic activity.  Pt reporting understanding of recommendation with energy conservation strategies.   PLAN:  OT FREQUENCY: 1x/week  OT DURATION: 4 weeks  PLANNED INTERVENTIONS: therapeutic exercise, therapeutic activity, neuromuscular re-education, manual therapy, balance training, functional mobility training, compression bandaging, moist heat, cryotherapy, patient/family education,  energy conservation, coping strategies training, and DME and/or AE instructions  CONSULTED AND AGREED WITH PLAN OF CARE: Patient and family member/caregiver  PLAN FOR NEXT SESSION:  Review HEP, check on scar pain, initiate RTW discussion (still at least 3 months out from return to work), continue toward goals.   Rosalio Loud, OTR/L 09/25/2022, 8:49 AM

## 2022-09-26 NOTE — Progress Notes (Signed)
Echo with poor window due to thin habitus but shows EF 60-65%, mild LVH but no valvular abnormality. Called and discussed the results with patient.

## 2022-09-29 NOTE — Assessment & Plan Note (Signed)
Presents with persistent abdominal pain after undergoing exploratory laparotomy with small bowel resection and temporary abdominal closure in 06/25/2022 and subsequent washout and abdominal closure on 06/27/2022.  Pain has not resolved and he has been seen by his surgeon on 08/28/2022 for this.  Incision is healing well without any signs of hernia or infection.  He is having about 3 bowel movements per day but is taking MiraLAX twice a day.  We discussed his pain may be due to normal healing, excessive scar tissue, and abdominal cramping with frequent bowel movements.  He is taking Bentyl twice a day, gabapentin, and Robaxin.  We discussed changes to these to possibly help. - Increase Bentyl 20 mg to 3 times a day with meals and before bedtime - Titrate MiraLAX to 1 bowel movement per day, if this worsens pain he may go back to twice daily - Increase Robaxin to 750 mg every 8 hours as needed and gabapentin 100 mg 3 times daily - Follow-up with surgery if any new or worsening pain

## 2022-09-29 NOTE — Assessment & Plan Note (Signed)
He continues to have significant back and lower extremity pain and numbness with associated foot drop.  He continues with physical therapy twice a week and occupational therapy once a week.  I have sent in referral for ankle-foot orthosis.  His gabapentin has been working well for his pain overall but there is some room for improvement.  Robaxin is also been working somewhat. - Gabapentin increased to 400 mg 3 times daily and Robaxin to 750 mg 3 times daily as needed

## 2022-09-30 ENCOUNTER — Ambulatory Visit: Payer: 59 | Admitting: Rehabilitative and Restorative Service Providers"

## 2022-09-30 ENCOUNTER — Ambulatory Visit: Payer: 59 | Admitting: Physical Therapy

## 2022-09-30 ENCOUNTER — Ambulatory Visit: Payer: 59 | Admitting: Occupational Therapy

## 2022-09-30 ENCOUNTER — Ambulatory Visit: Payer: 59

## 2022-09-30 ENCOUNTER — Encounter: Payer: Self-pay | Admitting: Rehabilitative and Restorative Service Providers"

## 2022-09-30 DIAGNOSIS — R2681 Unsteadiness on feet: Secondary | ICD-10-CM

## 2022-09-30 DIAGNOSIS — M6281 Muscle weakness (generalized): Secondary | ICD-10-CM

## 2022-09-30 DIAGNOSIS — R2689 Other abnormalities of gait and mobility: Secondary | ICD-10-CM

## 2022-09-30 NOTE — Progress Notes (Signed)
Internal Medicine Clinic Attending  Case discussed with Dr. Goodwin  At the time of the visit.  We reviewed the resident's history and exam and pertinent patient test results.  I agree with the assessment, diagnosis, and plan of care documented in the resident's note.  

## 2022-09-30 NOTE — Therapy (Addendum)
OUTPATIENT PHYSICAL THERAPY NEURO TREATMENT   Patient Name: Gabriel Dalton MRN: [id number] DOB:02-09-1986, 37 y.o., male Today's Date: 09/30/2022   PCP: Steffanie Rainwater, MD/to transition to Dr. Berkley Harvey PROVIDER: Gust Rung, DO   END OF SESSION:  PT End of Session - 09/30/22 0851     Visit Number 5    Date for PT Re-Evaluation 10/24/22    Authorization Type U[employer]    Authorization - Visit Number 5    Authorization - Number of Visits 50    PT Start Time 0846    PT Stop Time 0925    PT Time Calculation (min) 39 min    Activity Tolerance Patient tolerated treatment well    Behavior During Therapy [facility] for tasks assessed/performed              Past Medical History:  Diagnosis Date   Abdominal fluid collection 07/11/2022   BPH (benign prostatic hyperplasia)    Bronchitis    Melena 04/25/2019   Multiple gastric ulcers    Peritonitis (HCC) 06/26/2022   Past Surgical History:  Procedure Laterality Date   CHOLECYSTECTOMY N/A 06/25/2022   Procedure: LAPAROSCOPIC CHOLECYSTECTOMY;  Surgeon: Quentin Ore, MD;  Location: [facility];  Service: General;  Laterality: N/A;   COLONOSCOPY     I & D EXTREMITY  10/18/2011   Procedure: IRRIGATION AND DEBRIDEMENT EXTREMITY;  Surgeon: Dominica Severin, MD;  Location: [facility];  Service: Orthopedics;  Laterality: Right;  Irrigation and Debridement  Right Middle Finger; Removal of foreign body   LAPAROTOMY N/A 06/27/2022   Procedure: EXPLORATORY LAPAROTOMY, WASH OUT,  ABDOMINAL CLOSURE;  Surgeon: Quentin Ore, MD;  Location: [facility];  Service: General;  Laterality: N/A;   Patient Active Problem List   Diagnosis Date Noted   Lumbar spondylosis with myelopathy 09/03/2022   Heart murmur 09/03/2022   Vitamin D deficiency 08/01/2022   Thiamine deficiency 08/01/2022   Electrolyte abnormality 07/09/2022   Benign prostatic hyperplasia 07/09/2022   Severe protein-calorie malnutrition (HCC) 06/25/2022   Small  bowel perforation (HCC) 06/25/2022   Cholelithiasis 06/24/2022   Early satiety 05/27/2022   Nausea and vomiting 05/27/2022   Esophagitis determined by endoscopy 04/23/2022   Abnormal colonoscopy 12/26/2021   Gastroesophageal reflux disease 12/26/2021   Normocytic anemia 04/25/2019   Gastric ulcers 04/25/2019   Generalized abdominal pain 04/01/2019    ONSET DATE: 09/03/2022 (MD referral); abdominal surgery 06/2022  REFERRING DIAG:  M47.816 (ICD-10-CM) - Lumbar spondylosis  M47.16 (ICD-10-CM) - Lumbar spondylosis with myelopathy    THERAPY DIAG:  Muscle weakness (generalized)  Unsteadiness on feet  Other abnormalities of gait and mobility  Rationale for Evaluation and Treatment: Rehabilitation  SUBJECTIVE:  SUBJECTIVE STATEMENT: Patient reports his appointment with PCP went okay.  Patient is excited to start PT at ortho to start some different exercises.  Patient states that he did not want to try traction today, but may be willing to try it in the future.  Pt accompanied by: significant other  PERTINENT HISTORY: underwent exploratory laparotomy with small bowel resection with temporary abdominal closure on 06/25/22, and washout with abdominal closure on 06/27/22.   PAIN:  Are you having pain? Yes: NPRS scale: 5-6/10 Pain location: belly on scar Pain description: bloating Aggravating factors: eating Relieving factors: bowel movement Low back pain when sleeping; 6-7/10 in back.  PRECAUTIONS: Other: Lifting restrictions  WEIGHT BEARING RESTRICTIONS: No  FALLS: Has patient fallen in last 6 months? Yes. 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 Small left subarticular disc protrusion (at site of posterior annular fissure). The disc protrusion results in slight left subarticular narrowing, and contacts the descending left S1 nerve root. No significant central canal stenosis. COGNITION: Overall cognitive status: Within functional limits for tasks assessed   SENSATION: Light touch: Impaired   COORDINATION: Slightly slowed heel to shin  EDEMA:  Feet swell sometimes  POSTURE: No Significant postural limitations  LOWER EXTREMITY ROM:     Passive  Right Eval Left Eval  Hip flexion    Hip extension    Hip abduction    Hip adduction    Hip internal rotation    Hip external rotation    Knee flexion    Knee extension    Ankle dorsiflexion 12 12  Ankle plantarflexion    Ankle inversion    Ankle eversion     (Blank rows = not tested)  LOWER EXTREMITY MMT:    MMT Right Eval Left Eval  Hip flexion 4 4  Hip extension    Hip abduction    Hip adduction    Hip internal rotation    Hip external rotation    Knee flexion 4 3+  Knee extension 4+ 4+  Ankle dorsiflexion 1 1  Ankle plantarflexion 3- 3-  Ankle inversion 3- 3-  Ankle eversion 3- 3-  (Blank rows = not tested)   TRANSFERS: Assistive device utilized: None  Sit to stand: Modified independence Stand to sit: Modified independence  GAIT: Gait pattern: step through pattern, decreased step length- Right, decreased step length- Left, decreased ankle dorsiflexion- Right, decreased ankle dorsiflexion- Left, Right steppage, and Left steppage Distance walked: 50 ft x 2 Assistive device utilized: Single point cane Level of  assistance: Modified independence  FUNCTIONAL TESTS:  Eval: 5 times sit to stand: 19.97 sec arms crossed at chest Timed up and go (TUG): 17.62 10 meter walk test: 13.25 sec cane (2.48 ft/sec) DGI :  12/24 SLS:  RLE 4 sec, LLE 1 sec  09/30/2022: 5 times sit to stand: 9.64 sec arms crossed at chest   TODAY'S TREATMENT:  Date:  09/30/2022 Nustep level 2 x6 min with PT present to discuss status Seated:  LAQ with 1 sec hold at TKE.  2x10 bilat Seated  hip abduction with red theraband 2x10 Seated hamstring curl with red tband 2x10 each bilat Sit to stand x10 (without UE support) Sit to/from stand x5 Prone position x30 sec, then prone on elbows x30 sec Prone press-up x10 Prone hamstring curl with 2# ankle weights 2x10 bilat   PATIENT EDUCATION:  Education details: Issued HEP Person educated: Patient and wife Education method: Explanation, Demonstration, and Handouts Education comprehension: verbalized understanding and returned demonstration   HOME EXERCISE PROGRAM: Access Code: [email or web address] URL: [email or web address] Date: 09/30/2022 Prepared by: Reather Laurence  Program Notes Seated heel digs:  Sit at edge of computer (rolling) chair:  dig your heels and pull forward/push back.  Do NOT use your back for pulling forward/pushing back.  Exercises - Sit to Stand  - 1 x daily - 5 x weekly - 5 reps - Seated Long Arc Quad  - 1 x daily - 7 x weekly - 2 sets - 10 reps - Seated Anterior Pelvic Tilt  - 1-2 x daily - 7 x weekly - 10 reps - Standing Lumbar Extension  - 1-2 x daily - 7 x weekly - 10 reps - Wall Quarter Squat  - 1 x daily - 7 x weekly - 10 reps - Standing Gastroc Stretch at Counter  - 1 x daily - 7 x weekly - 1 sets - 3 reps - 30 sec hold - Prone Knee Flexion  - 1 x daily - 7 x weekly - 2 sets - 10 reps - Prone Hip Extension  - 1 x daily - 7 x weekly - 2 sets - 10 reps - Supine Transversus Abdominis Bracing - Hands on Thighs  - 1 x daily - 7 x weekly - 2 sets -  10 reps   GOALS: Goals reviewed with patient? Yes  SHORT TERM GOALS: Target date: 10/10/2022  Pt will be independent with HEP for improved strength, balance, gait. Baseline: Goal status: IN PROGRESS  2.  Pt will report reduction in back pain by at least 50 % for improved functional mobility. Baseline: 7/10 Goal status: IN PROGRESS  3.  Pt will improve 5x sit<>stand to less than or equal to 15 sec to demonstrate improved functional strength and transfer efficiency.  Baseline: 19.97 sec Goal status: MET on 09/30/2022  LONG TERM GOALS: Target date: 10/24/2022  Pt will be independent with HEP for improved strength, balance, pain, gait. Baseline:  Goal status: IN PROGRESS  2.  Pt will improve DGI score to at least 17/24 to decrease fall risk. Baseline: 12/24 Goal status: IN PROGRESS  3.  Pt will improve 5x sit<>stand to less than or equal to 11.4 sec to demonstrate improved functional strength and transfer efficiency. Baseline: 19.97 sec Goal status: MET on 09/30/2022  4.  Pt will improve gait velocity to at least 2.62 ft/sec for improved gait efficiency and safety. Baseline: 2.48 ft/sec with cane Goal status: IN PROGRESS  ASSESSMENT:  CLINICAL IMPRESSION: Mr Mosko presents to skilled PT reporting that he is excited to start doing increased strengthening.  Patient able to progress with limited ambulation in clinic without [facility] with cuing for increased hip/knee flexion secondary to foot drop.  Patient requires cuing during seated exercises for improved upright posture.  Patient with initial pain with prone secondary to incision, but able to progress with prone exercises.  Patient with some reported back paraspinal tightness with prone press-up, so did not add to HEP.  Patient has greatly improved time with 5 times sit to stand and has met both  short and long-term goal for this measure.  Patient reports feeling better by end of session with decreased pain noted.  Pt continues to  require skilled PT to progress towards goal related activities.  OBJECTIVE IMPAIRMENTS: Abnormal gait, decreased balance, decreased mobility, difficulty walking, decreased ROM, decreased strength, impaired flexibility, and impaired sensation.   ACTIVITY LIMITATIONS: carrying, lifting, bending, standing, stairs, transfers, and locomotion level  PARTICIPATION LIMITATIONS: driving, community activity, and occupation  PERSONAL FACTORS: 3+ comorbidities: see above; recent abdominal surgery 06/2022  are also affecting patient's functional outcome.   REHAB POTENTIAL: Good  CLINICAL DECISION MAKING: Evolving/moderate complexity  EVALUATION COMPLEXITY: Moderate  PLAN:  PT FREQUENCY: 2x/week  PT DURATION: 6 weeks  PLANNED INTERVENTIONS: Therapeutic exercises, Therapeutic activity, Neuromuscular re-education, Balance training, Gait training, Patient/Family education, Self Care, Joint mobilization, Dry Needling, Electrical stimulation, and Manual therapy  PLAN FOR NEXT SESSION: Pt to see [facility] next week for further assessment of traction/low back extension progression exercises.  Check on AFO order and transfer to [facility] for a few appointments; Continue extension/McKenzie exercises for low back (L5-S1)Try DN, manual therapy. Also work on safety with mobility-trial AFO, BLE strengthening.   Reather Laurence, PT 09/30/22 10:07 AM  [facility] [address] [place], [place] [address] Phone # [phone] Fax [phone]

## 2022-10-01 ENCOUNTER — Encounter: Payer: 59 | Admitting: Student

## 2022-10-02 ENCOUNTER — Ambulatory Visit: Payer: 59 | Admitting: Occupational Therapy

## 2022-10-02 ENCOUNTER — Ambulatory Visit: Payer: 59 | Admitting: Physical Therapy

## 2022-10-05 ENCOUNTER — Encounter: Payer: Self-pay | Admitting: *Deleted

## 2022-10-07 ENCOUNTER — Ambulatory Visit: Payer: 59

## 2022-10-07 ENCOUNTER — Ambulatory Visit: Payer: 59 | Admitting: Physical Therapy

## 2022-10-07 ENCOUNTER — Encounter: Payer: 59 | Admitting: Occupational Therapy

## 2022-10-07 DIAGNOSIS — R2681 Unsteadiness on feet: Secondary | ICD-10-CM

## 2022-10-07 DIAGNOSIS — R2689 Other abnormalities of gait and mobility: Secondary | ICD-10-CM

## 2022-10-07 DIAGNOSIS — M6281 Muscle weakness (generalized): Secondary | ICD-10-CM

## 2022-10-07 NOTE — Therapy (Signed)
OUTPATIENT PHYSICAL THERAPY TREATMENT   Patient Name: Abid Fass MRN: [id number] DOB:[birth year], 37 y.o., male Today's Date: 10/07/2022   PCP: Steffanie Rainwater, MD/to transition to Dr. Berkley Harvey PROVIDER: Gust Rung, DO   END OF SESSION:  PT End of Session - 10/07/22 1105     Visit Number 6    Date for PT Re-Evaluation 10/24/22    Authorization Type UHC    Authorization - Visit Number 6    Authorization - Number of Visits 50    PT Start Time 1018    PT Stop Time 1058    PT Time Calculation (min) 40 min    Activity Tolerance Patient tolerated treatment well    Behavior During Therapy [facility] for tasks assessed/performed               Past Medical History:  Diagnosis Date   Abdominal fluid collection 07/11/2022   BPH (benign prostatic hyperplasia)    Bronchitis    Melena 04/25/2019   Multiple gastric ulcers    Peritonitis (HCC) 06/26/2022   Past Surgical History:  Procedure Laterality Date   CHOLECYSTECTOMY N/A 06/25/2022   Procedure: LAPAROSCOPIC CHOLECYSTECTOMY;  Surgeon: Quentin Ore, MD;  Location: [facility];  Service: General;  Laterality: N/A;   COLONOSCOPY     I & D EXTREMITY  10/18/2011   Procedure: IRRIGATION AND DEBRIDEMENT EXTREMITY;  Surgeon: Dominica Severin, MD;  Location: [facility];  Service: Orthopedics;  Laterality: Right;  Irrigation and Debridement  Right Middle Finger; Removal of foreign body   LAPAROTOMY N/A 06/27/2022   Procedure: EXPLORATORY LAPAROTOMY, WASH OUT,  ABDOMINAL CLOSURE;  Surgeon: Quentin Ore, MD;  Location: [facility];  Service: General;  Laterality: N/A;   Patient Active Problem List   Diagnosis Date Noted   Lumbar spondylosis with myelopathy 09/03/2022   Heart murmur 09/03/2022   Vitamin D deficiency 08/01/2022   Thiamine deficiency 08/01/2022   Electrolyte abnormality 07/09/2022   Benign prostatic hyperplasia 07/09/2022   Severe protein-calorie malnutrition (HCC) 06/25/2022   Small bowel  perforation (HCC) 06/25/2022   Cholelithiasis 06/24/2022   Early satiety 05/27/2022   Nausea and vomiting 05/27/2022   Esophagitis determined by endoscopy 04/23/2022   Abnormal colonoscopy 12/26/2021   Gastroesophageal reflux disease 12/26/2021   Normocytic anemia 04/25/2019   Gastric ulcers 04/25/2019   Generalized abdominal pain 04/01/2019    ONSET DATE: 09/03/2022 (MD referral); abdominal surgery 06/2022  REFERRING DIAG:  M47.816 (ICD-10-CM) - Lumbar spondylosis  M47.16 (ICD-10-CM) - Lumbar spondylosis with myelopathy    THERAPY DIAG:  Muscle weakness (generalized)  Unsteadiness on feet  Other abnormalities of gait and mobility  Rationale for Evaluation and Treatment: Rehabilitation  SUBJECTIVE:  SUBJECTIVE STATEMENT: I had a set-back last week.  I had some abdominal pain and my legs felt more numb.  I'm taking Gabapentin as the doctor told me to.    Pt accompanied by: significant other  PERTINENT HISTORY: underwent exploratory laparotomy with small bowel resection with temporary abdominal closure on 06/25/22, and washout with abdominal closure on 06/27/22.   PAIN:  Are you having pain? Yes: NPRS scale: 0/10 Pain location: belly on scar Pain description: bloating Aggravating factors: eating Relieving factors: bowel movement Low back pain when sleeping; 6-7/10 in back.  PRECAUTIONS: Other: Lifting restrictions  WEIGHT BEARING RESTRICTIONS: No  FALLS: Has patient fallen in last 6 months? Yes. 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 Small left subarticular disc protrusion (at site of posterior annular fissure). The disc protrusion results in slight left subarticular narrowing, and contacts the descending left S1 nerve root. No significant central canal stenosis. COGNITION: Overall cognitive status: Within functional limits for tasks assessed   SENSATION: Light touch: Impaired   COORDINATION: Slightly slowed heel to shin  EDEMA:  Feet swell sometimes  POSTURE: No Significant postural limitations  LOWER EXTREMITY ROM:     Passive  Right Eval Left Eval  Hip flexion    Hip extension    Hip abduction    Hip adduction    Hip internal rotation    Hip external rotation    Knee flexion    Knee extension    Ankle dorsiflexion 12 12  Ankle plantarflexion    Ankle inversion    Ankle eversion     (Blank rows = not tested)  LOWER EXTREMITY MMT:    MMT Right Eval Left Eval  Hip flexion 4 4  Hip extension    Hip abduction    Hip adduction    Hip internal rotation    Hip external rotation    Knee flexion 4 3+  Knee extension 4+ 4+  Ankle dorsiflexion 1 1  Ankle plantarflexion 3- 3-  Ankle inversion 3- 3-  Ankle eversion 3- 3-  (Blank rows = not tested)   TRANSFERS: Assistive device utilized: None  Sit to stand: Modified independence Stand to sit: Modified independence  GAIT: Gait pattern: step through pattern, decreased step length- Right, decreased step length- Left, decreased ankle dorsiflexion- Right, decreased ankle dorsiflexion- Left, Right steppage, and Left steppage Distance walked: 50 ft x 2 Assistive device utilized: Single point cane Level of assistance: Modified independence  FUNCTIONAL TESTS:  Eval: 5 times sit to stand: 19.97 sec arms  crossed at chest Timed up and go (TUG): 17.62 10 meter walk test: 13.25 sec cane (2.48 ft/sec) DGI :  12/24 SLS:  RLE 4 sec, LLE 1 sec  09/30/2022: 5 times sit to stand: 9.64 sec arms crossed at chest   TODAY'S TREATMENT: Date:  10/07/2022 Nustep level 2 x6 min with PT present to discuss status Seated:  LAQ with 1 sec hold at TKE.  2x10 bilat Standing hip abduction: standing on balance pad on supporting leg to modify for foot drop on  opposite side 2x10 Seated hamstring curl with yellow loop 2x10 each bilat Sit to stand 2x10 (without UE support) Seated hip abduction: yellow loop 2x10 Seated wobble board: PF/DF, inversion/eversion- each foot x 15 each.  Pt has one at home and will do this to train ankle muscles. Alternating step taps: 4" step, 1x10 with UE support,  2x10 without UE support  Date:  09/30/2022 Nustep level 2 x6 min with PT present to discuss status Seated:  LAQ with 1 sec hold at TKE.  2x10 bilat Seated hip abduction with red theraband 2x10 Seated hamstring curl with red tband 2x10 each bilat Sit to stand x10 (without UE support) Sit to/from stand x5 Prone position x30 sec, then prone on elbows x30 sec Prone press-up x10 Prone hamstring curl with 2# ankle weights 2x10 bilat   PATIENT EDUCATION:  Education details: Issued HEP Person educated: Patient and wife Education method: Explanation, Demonstration, and Handouts Education comprehension: verbalized understanding and returned demonstration   HOME EXERCISE PROGRAM: Access Code: [email or web address] URL: [email or web address] Date: 09/30/2022 Prepared by: Reather Laurence  Program Notes Seated heel digs:  Sit at edge of computer (rolling) chair:  dig your heels and pull forward/push back.  Do NOT use your back for pulling forward/pushing back.  Exercises - Sit to Stand  - 1 x daily - 5 x weekly - 5 reps - Seated Long Arc Quad  - 1 x daily - 7 x weekly - 2 sets - 10 reps - Seated Anterior Pelvic Tilt  -  1-2 x daily - 7 x weekly - 10 reps - Standing Lumbar Extension  - 1-2 x daily - 7 x weekly - 10 reps - Wall Quarter Squat  - 1 x daily - 7 x weekly - 10 reps - Standing Gastroc Stretch at Counter  - 1 x daily - 7 x weekly - 1 sets - 3 reps - 30 sec hold - Prone Knee Flexion  - 1 x daily - 7 x weekly - 2 sets - 10 reps - Prone Hip Extension  - 1 x daily - 7 x weekly - 2 sets - 10 reps - Supine Transversus Abdominis Bracing - Hands on Thighs  - 1 x daily - 7 x weekly - 2 sets - 10 reps   GOALS: Goals reviewed with patient? Yes  SHORT TERM GOALS: Target date: 10/10/2022  Pt will be independent with HEP for improved strength, balance, gait. Baseline: Goal status: IN PROGRESS  2.  Pt will report reduction in back pain by at least 50 % for improved functional mobility. Baseline: intermittent and 8/10 max (10/07/22) Goal status: IN PROGRESS  3.  Pt will improve 5x sit<>stand to less than or equal to 15 sec to demonstrate improved functional strength and transfer efficiency.  Baseline: 19.97 sec Goal status: MET on 09/30/2022  LONG TERM GOALS: Target date: 10/24/2022  Pt will be independent with HEP for improved strength, balance, pain, gait. Baseline:  Goal status: IN PROGRESS  2.  Pt will improve DGI score to at least 17/24 to decrease fall risk. Baseline: 12/24 Goal status: IN PROGRESS  3.  Pt will improve 5x sit<>stand to less than or equal to 11.4 sec to demonstrate improved functional strength and transfer efficiency. Baseline: 19.97 sec Goal status: MET on 09/30/2022  4.  Pt will improve gait velocity to at least 2.62 ft/sec for improved gait efficiency and safety. Baseline: 2.48 ft/sec with cane Goal status: IN PROGRESS  ASSESSMENT:  CLINICAL IMPRESSION: Pt is excited to  do balance and strength exercises.  He has a wobble board at home that home health PT told him to but he doesn't know how to use.  PT showed him how to do ankle 4 way with the board.  He was challenged with  inversion and DF and used tactile cues to isolate ankle motion.   Patient has greatly improved time with 5 times sit to stand and has met both short and long-term goal for this measure.  PT provided verbal and tactile cues throughout session.   Pt continues to require skilled PT to progress towards goal related activities.  OBJECTIVE IMPAIRMENTS: Abnormal gait, decreased balance, decreased mobility, difficulty walking, decreased ROM, decreased strength, impaired flexibility, and impaired sensation.   ACTIVITY LIMITATIONS: carrying, lifting, bending, standing, stairs, transfers, and locomotion level  PARTICIPATION LIMITATIONS: driving, community activity, and occupation  PERSONAL FACTORS: 3+ comorbidities: see above; recent abdominal surgery 06/2022  are also affecting patient's functional outcome.   REHAB POTENTIAL: Good  CLINICAL DECISION MAKING: Evolving/moderate complexity  EVALUATION COMPLEXITY: Moderate  PLAN:  PT FREQUENCY: 2x/week  PT DURATION: 6 weeks  PLANNED INTERVENTIONS: Therapeutic exercises, Therapeutic activity, Neuromuscular re-education, Balance training, Gait training, Patient/Family education, Self Care, Joint mobilization, Dry Needling, Electrical stimulation, and Manual therapy  PLAN FOR NEXT SESSION: Waiting on AFO; Continue extension/McKenzie exercises for low back (L5-S1)Try DN, manual therapy. Balance, and strength progression.  Also work on safety with mobility-trial AFO, BLE strengthening.   Lorrene Reid, PT 10/07/22 11:23 AM   [facility] [address], [address] [place], [place] [address] Phone # [phone] Fax [phone]

## 2022-10-08 NOTE — Therapy (Signed)
OUTPATIENT PHYSICAL THERAPY NEURO TREATMENT   Patient Name: Gabriel Dalton MRN: [id number] DOB:01/28/1986, 37 y.o., male Today's Date: 10/09/2022   PCP: Steffanie Rainwater, MD/to transition to Dr. Berkley Harvey PROVIDER: Gust Rung, DO   END OF SESSION:  PT End of Session - 10/09/22 0931     Visit Number 7    Date for PT Re-Evaluation 10/24/22    Authorization Type [employer]    Authorization - Visit Number 7    Authorization - Number of Visits 50    PT Start Time 0848    PT Stop Time 0930    PT Time Calculation (min) 42 min    Activity Tolerance Patient tolerated treatment well    Behavior During Therapy [facility] for tasks assessed/performed               Past Medical History:  Diagnosis Date   Abdominal fluid collection 07/11/2022   BPH (benign prostatic hyperplasia)    Bronchitis    Melena 04/25/2019   Multiple gastric ulcers    Peritonitis (HCC) 06/26/2022   Past Surgical History:  Procedure Laterality Date   CHOLECYSTECTOMY N/A 06/25/2022   Procedure: LAPAROSCOPIC CHOLECYSTECTOMY;  Surgeon: Quentin Ore, MD;  Location: [facility];  Service: General;  Laterality: N/A;   COLONOSCOPY     I & D EXTREMITY  10/18/2011   Procedure: IRRIGATION AND DEBRIDEMENT EXTREMITY;  Surgeon: Dominica Severin, MD;  Location: [facility];  Service: Orthopedics;  Laterality: Right;  Irrigation and Debridement  Right Middle Finger; Removal of foreign body   LAPAROTOMY N/A 06/27/2022   Procedure: EXPLORATORY LAPAROTOMY, WASH OUT,  ABDOMINAL CLOSURE;  Surgeon: Quentin Ore, MD;  Location: [facility];  Service: General;  Laterality: N/A;   Patient Active Problem List   Diagnosis Date Noted   Lumbar spondylosis with myelopathy 09/03/2022   Heart murmur 09/03/2022   Vitamin D deficiency 08/01/2022   Thiamine deficiency 08/01/2022   Electrolyte abnormality 07/09/2022   Benign prostatic hyperplasia 07/09/2022   Severe protein-calorie malnutrition (HCC) 06/25/2022   Small  bowel perforation (HCC) 06/25/2022   Cholelithiasis 06/24/2022   Early satiety 05/27/2022   Nausea and vomiting 05/27/2022   Esophagitis determined by endoscopy 04/23/2022   Abnormal colonoscopy 12/26/2021   Gastroesophageal reflux disease 12/26/2021   Normocytic anemia 04/25/2019   Gastric ulcers 04/25/2019   Generalized abdominal pain 04/01/2019    ONSET DATE: 09/03/2022 (MD referral); abdominal surgery 06/2022  REFERRING DIAG:  M47.816 (ICD-10-CM) - Lumbar spondylosis  M47.16 (ICD-10-CM) - Lumbar spondylosis with myelopathy    THERAPY DIAG:  Muscle weakness (generalized)  Unsteadiness on feet  Other abnormalities of gait and mobility  Rationale for Evaluation and Treatment: Rehabilitation  SUBJECTIVE:  SUBJECTIVE STATEMENT: Last week had some stomach issues, this week was having issues with keeping his food down. In the process of changing MD's from [place] to [place].   Pt accompanied by: significant other  PERTINENT HISTORY: underwent exploratory laparotomy with small bowel resection with temporary abdominal closure on 06/25/22, and washout with abdominal closure on 06/27/22.   PAIN:  Are you having pain? Yes: NPRS scale: 6-7/10 Pain location: belly on scar Pain description: bloating Aggravating factors: eating Relieving factors: bowel movement Low back pain when sleeping; 6-7/10 in back.  PRECAUTIONS: Other: Lifting restrictions  WEIGHT BEARING RESTRICTIONS: No  FALLS: Has patient fallen in last 6 months? Yes. Number of falls 1  LIVING ENVIRONMENT: Lives with: lives with their spouse Lives in: House/apartment Stairs: Yes: External: 6 steps; on right going up and on left going up Has following equipment at home: Single point cane  PLOF: Independent and Vocation/Vocational  requirements: truck driver  PATIENT GOALS: To get strength, balance, stability back  OBJECTIVE:    TODAY'S TREATMENT: 10/09/22 Activity Comments  Nustep L5 x6 min LEs only    sitting pelvic tilts 10x Limited extension ROM; report of relief with ant pelvic tilt  standing lumbar extension 10x  Holding onto chair for support; cues to avoid excessive head movement and isolate LB  sitting AAROM DF with cane 5x each  Focusing on 50% assist with hands and improved eccentric control  Romberg EC 2x30 Mild-mod sway; c/o some dizziness   L forearm flexor stretch 30" C/t pt c/o L elbow/forearm pain after short durations of elbow flexion   Romberg + head turns 30" C/o blurred vision and dizziness     PATIENT EDUCATION: Education details: edu on reasoning for trialing mechanical traction, discussion on AFO order (pt and PT have not received); HEP update with cues for safety; edu on safety ambulating in dim lighting d/t limited LE sensation Person educated: Patient Education method: Explanation Education comprehension: verbalized understanding   Access Code: [email or web address] URL: [email or web address] Date: 10/09/2022 Prepared by: [facility] - Inpatient - Outpatient  Rehab - [facility]  Program Notes Seated heel digs:  Sit at edge of computer (rolling) chair:  dig your heels and pull forward/push back.  Do NOT use your back for pulling forward/pushing back.  Exercises - Sit to Stand  - 1 x daily - 5 x weekly - 5 reps - Seated Long Arc Quad  - 1 x daily - 7 x weekly - 2 sets - 10 reps - Seated Anterior Pelvic Tilt  - 1-2 x daily - 7 x weekly - 10 reps - Standing Lumbar Extension  - 1-2 x daily - 7 x weekly - 10 reps - Wall Quarter Squat  - 1 x daily - 7 x weekly - 10 reps - Standing Gastroc Stretch at Counter  - 1 x daily - 7 x weekly - 1 sets - 3 reps - 30 sec hold - Prone Knee Flexion  - 1 x daily - 7 x weekly - 2 sets - 10 reps - Prone Hip Extension  - 1 x daily - 7 x weekly - 2 sets - 10  reps - Supine Transversus Abdominis Bracing - Hands on Thighs  - 1 x daily - 7 x weekly - 2 sets - 10 reps - Seated Calf Stretch with Strap  - 1 x daily - 5 x weekly - 2 sets - 5 reps - Romberg Stance with Eyes Closed  - 1 x daily - 5 x weekly - 2 sets -  30 sec hold - Narrow Stance with Counter Support  - 1 x daily - 5 x weekly - 2 sets - 30 sec hold    -------------------------------------------------------------------------------------  Below measures were taken at time of initial evaluation unless otherwise specified:   DIAGNOSTIC FINDINGS: See MRI:  At L5-S1, there is mild disc degeneration. Small left subarticular disc protrusion (at site of posterior annular fissure). The disc protrusion results in slight left subarticular narrowing, and contacts the descending left S1 nerve root. No significant central canal stenosis. COGNITION: Overall cognitive status: Within functional limits for tasks assessed   SENSATION: Light touch: Impaired   COORDINATION: Slightly slowed heel to shin  EDEMA:  Feet swell sometimes  POSTURE: No Significant postural limitations  LOWER EXTREMITY ROM:     Passive  Right Eval Left Eval  Hip flexion    Hip extension    Hip abduction    Hip adduction    Hip internal rotation    Hip external rotation    Knee flexion    Knee extension    Ankle dorsiflexion 12 12  Ankle plantarflexion    Ankle inversion    Ankle eversion     (Blank rows = not tested)  LOWER EXTREMITY MMT:    MMT Right Eval Left Eval  Hip flexion 4 4  Hip extension    Hip abduction    Hip adduction    Hip internal rotation    Hip external rotation    Knee flexion 4 3+  Knee extension 4+ 4+  Ankle dorsiflexion 1 1  Ankle plantarflexion 3- 3-  Ankle inversion 3- 3-  Ankle eversion 3- 3-  (Blank rows = not tested)   TRANSFERS: Assistive device utilized: None  Sit to stand: Modified independence Stand to sit: Modified independence  GAIT: Gait pattern: step  through pattern, decreased step length- Right, decreased step length- Left, decreased ankle dorsiflexion- Right, decreased ankle dorsiflexion- Left, Right steppage, and Left steppage Distance walked: 50 ft x 2 Assistive device utilized: Single point cane Level of assistance: Modified independence  FUNCTIONAL TESTS:  5 times sit to stand: 19.97 sec arms crossed at chest Timed up and go (TUG): 17.62 10 meter walk test: 13.25 sec cane (2.48 ft/sec) DGI :  12/24 SLS:  RLE 4 sec, LLE 1 sec       GOALS: Goals reviewed with patient? Yes  SHORT TERM GOALS: Target date: 10/10/2022  Pt will be independent with HEP for improved strength, balance, gait. Baseline: Goal status: IN PROGRESS  2.  Pt will report reduction in back pain by at least 50 % for improved functional mobility. Baseline: 7/10 Goal status: IN PROGRESS  3.  Pt will improve 5x sit<>stand to less than or equal to 15 sec to demonstrate improved functional strength and transfer efficiency.  Baseline: 19.97 sec Goal status: IN PROGRESS  LONG TERM GOALS: Target date: 10/24/2022  Pt will be independent with HEP for improved strength, balance, pain, gait. Baseline:  Goal status: IN PROGRESS  2.  Pt will improve DGI score to at least 17/24 to decrease fall risk. Baseline: 12/24 Goal status: IN PROGRESS  3.  Pt will improve 5x sit<>stand to less than or equal to 11.4 sec to demonstrate improved functional strength and transfer efficiency. Baseline: 19.97 sec Goal status: IN PROGRESS  4.  Pt will improve gait velocity to at least 2.62 ft/sec for improved gait efficiency and safety. Baseline: 2.48 ft/sec with cane Goal status: IN PROGRESS  ASSESSMENT:  CLINICAL IMPRESSION: Patient  arrived to session with questions concerning his AFO order which has not been received. Patient requesting to send AFO order request to his new PCP Dr. Geraldo Pitter. Proceeded with gentle lumbopelvic mobility exercises, focusing on anterior pelvic  tilt and lumbar extension. AAROM ankle dorsiflexion activity revealed significant difficulty. Patient noted instability when ambulating/standing with head turns- trialed EC and head turn balance tasks which brought on some mild sway and c/o dizziness. Patient tolerated session well and without complaints upon leaving.    OBJECTIVE IMPAIRMENTS: Abnormal gait, decreased balance, decreased mobility, difficulty walking, decreased ROM, decreased strength, impaired flexibility, and impaired sensation.   ACTIVITY LIMITATIONS: carrying, lifting, bending, standing, stairs, transfers, and locomotion level  PARTICIPATION LIMITATIONS: driving, community activity, and occupation  PERSONAL FACTORS: 3+ comorbidities: see above; recent abdominal surgery 06/2022  are also affecting patient's functional outcome.   REHAB POTENTIAL: Good  CLINICAL DECISION MAKING: Evolving/moderate complexity  EVALUATION COMPLEXITY: Moderate  PLAN:  PT FREQUENCY: 2x/week  PT DURATION: 6 weeks  PLANNED INTERVENTIONS: Therapeutic exercises, Therapeutic activity, Neuromuscular re-education, Balance training, Gait training, Patient/Family education, Self Care, Joint mobilization, Dry Needling, Electrical stimulation, and Manual therapy  PLAN FOR NEXT SESSION: Check on AFO order. Continue extension/McKenzie exercises for low back (L5-S1)Try DN, manual therapy. Also work on safety with mobility-trial AFO, BLE strengthening.    Anette Guarneri, PT, DPT 10/09/22 9:33 AM  [facility] at [facility] [address] [place], [address] [place], [place] [address] Phone # [phone] Fax # [phone]

## 2022-10-09 ENCOUNTER — Ambulatory Visit: Payer: 59 | Admitting: Occupational Therapy

## 2022-10-09 ENCOUNTER — Ambulatory Visit: Payer: 59 | Admitting: Physical Therapy

## 2022-10-09 ENCOUNTER — Encounter: Payer: Self-pay | Admitting: Physical Therapy

## 2022-10-09 DIAGNOSIS — M6281 Muscle weakness (generalized): Secondary | ICD-10-CM | POA: Diagnosis not present

## 2022-10-09 DIAGNOSIS — R2681 Unsteadiness on feet: Secondary | ICD-10-CM

## 2022-10-09 DIAGNOSIS — R2689 Other abnormalities of gait and mobility: Secondary | ICD-10-CM

## 2022-10-09 NOTE — Therapy (Addendum)
 OUTPATIENT OCCUPATIONAL THERAPY TREATMENT NOTE  Patient Name: Gabriel Dalton MRN: [id number] DOB:1986-04-07, 37 y.o., male Today's Date: 10/09/2022  PCP: Steffanie Rainwater, MD REFERRING PROVIDER: Gust Rung, DO  END OF SESSION:  OT End of Session - 10/09/22 0937     Visit Number 4    Number of Visits 5    Date for OT Re-Evaluation 10/10/22    Authorization Type [employer]    OT Start Time [phone]    OT Stop Time 1015    OT Time Calculation (min) 42 min    Activity Tolerance Patient tolerated treatment well;No increased pain;Patient limited by fatigue;Patient limited by pain    Behavior During Therapy [facility] for tasks assessed/performed                Past Medical History:  Diagnosis Date   Abdominal fluid collection 07/11/2022   BPH (benign prostatic hyperplasia)    Bronchitis    Melena 04/25/2019   Multiple gastric ulcers    Peritonitis (HCC) 06/26/2022   Past Surgical History:  Procedure Laterality Date   CHOLECYSTECTOMY N/A 06/25/2022   Procedure: LAPAROSCOPIC CHOLECYSTECTOMY;  Surgeon: Quentin Ore, MD;  Location: [facility];  Service: General;  Laterality: N/A;   COLONOSCOPY     I & D EXTREMITY  10/18/2011   Procedure: IRRIGATION AND DEBRIDEMENT EXTREMITY;  Surgeon: Dominica Severin, MD;  Location: [facility];  Service: Orthopedics;  Laterality: Right;  Irrigation and Debridement  Right Middle Finger; Removal of foreign body   LAPAROTOMY N/A 06/27/2022   Procedure: EXPLORATORY LAPAROTOMY, WASH OUT,  ABDOMINAL CLOSURE;  Surgeon: Quentin Ore, MD;  Location: [facility];  Service: General;  Laterality: N/A;   Patient Active Problem List   Diagnosis Date Noted   Lumbar spondylosis with myelopathy 09/03/2022   Heart murmur 09/03/2022   Vitamin D deficiency 08/01/2022   Thiamine deficiency 08/01/2022   Electrolyte abnormality 07/09/2022   Benign prostatic hyperplasia 07/09/2022   Severe protein-calorie malnutrition (HCC) 06/25/2022   Small  bowel perforation (HCC) 06/25/2022   Cholelithiasis 06/24/2022   Early satiety 05/27/2022   Nausea and vomiting 05/27/2022   Esophagitis determined by endoscopy 04/23/2022   Abnormal colonoscopy 12/26/2021   Gastroesophageal reflux disease 12/26/2021   Normocytic anemia 04/25/2019   Gastric ulcers 04/25/2019   Generalized abdominal pain 04/01/2019    ONSET DATE: referral date 09/03/22  REFERRING DIAG: M47.816 (ICD-10-CM) - Lumbar spondylosis M47.16 (ICD-10-CM) - Lumbar spondylosis with myelopathy  THERAPY DIAG:  Unsteadiness on feet  Muscle weakness (generalized)  Rationale for Evaluation and Treatment: Rehabilitation  PERTINENT HISTORY: BPH, bronchitis, Multiple gastric ulcers Pt reports having surgery on 2/14 to remove gallbladder and noted intestines were ruptured.  Pt reports continued numbness and weakness from waist to toes with difficulty with walking.  Pt also reports decreased sustained strength on L side, reporting occasionally dropping items.    PRECAUTIONS: Other: lifting restriction, no > 5-8#   SUBJECTIVE:   SUBJECTIVE STATEMENT: Pt reports "going through some things" last week but feeling better this week.    Pt accompanied by:  self  PAIN:   Are you having pain? 6/10 in abd sx scar this morning   FALLS: Has patient fallen in last 6 months? Yes. Number of falls 1, trying to walk in kitchen and R foot gave out  LIVING ENVIRONMENT: Lives with: lives with their spouse Lives in: House/apartment Stairs: Yes: Internal: small threshold from living room to kitchen steps; and External: 6-8 steps; can reach both Has following  equipment at home: Single point cane, Walker - 2 wheeled, Environmental consultant - 4 wheeled, Wheelchair (manual), and Grab bars  PLOF: Independent, Independent with basic ADLs, and Vocation/Vocational requirements: regional truck driver  PATIENT GOALS: get my balance back, get my breath and energy back  OBJECTIVE: (All objective assessments below are from  initial evaluation on: 09/11/22 unless otherwise specified.)    HAND DOMINANCE: Right  ADLs: Overall ADLs: initially s/p surgery pt was requiring increased time and assist for bathing/dressing, however in last 2-3 weeks pt has been able to complete without assist. Transfers/ambulation related to ADLs: Utilizing [facility] Eating: Mod I Grooming: Mod I UB Dressing: Mod I LB Dressing: putting on shoes is a challenge Toileting: a little challenging due to low toilet seat, but Mod I Bathing: Mod I Tub Shower transfers: Mod I Equipment: Grab bars  IADLs: Shopping: recently resumed going to store with spouse, utilizing [employer] or w/c if taking a longer trip Light housekeeping: spouse primarily taking care of these task prior to and now.  Limited now due to lifting restriction Meal Prep: spouse primarily cooking Community mobility: short distance community driving Medication management: spouse reminding pt to take  MOBILITY STATUS: Needs Assist: utilizing SPC for ambulation and Hx of falls  ACTIVITY TOLERANCE: Activity tolerance: decreased endurance  UPPER EXTREMITY ROM:  WFL bilaterally  UPPER EXTREMITY MMT:     MMT Right eval Left eval  Shoulder flexion 5/5 4/5  Shoulder abduction    Shoulder adduction    Shoulder extension    Shoulder internal rotation    Shoulder external rotation    Middle trapezius    Lower trapezius    Elbow flexion 5/5 4+/5  Elbow extension 5/5 4+/5  Wrist flexion    Wrist extension    Wrist ulnar deviation    Wrist radial deviation    Wrist pronation    Wrist supination    (Blank rows = not tested)  HAND FUNCTION: Grip strength: Right: 76.5 lbs; Left: 61 lbs, Lateral pinch: Right: 20 lbs, Left: 16 lbs, and 3 point pinch: Right: 16 lbs, Left: 15 lbs 10/09/22: grip strength: R: 95, L: 95  COORDINATION: Finger Nose Finger test: [facility] 9 Hole Peg test: Right: 20.19 sec; Left: 21.38 sec Box and Blocks:  Right 56 blocks, Left 49 blocks  SENSATION: WFL  in UB, numbness and tingling in BLE  OBSERVATIONS: generalized debility s/p abdominal surgery   TODAY'S TREATMENT:                                                  10/09/22 Scar massage: OT reviewed recommendation for scar massage in both vertical, horizontal, and circular motions and encouraged increased times throughout the day as pt with decreased tolerance for much more than 1 minute at a time. Energy conservation: reviewed recommendations for energy conservation as pt reports hurting after cleaning out office.  Reviewed recommendations of completing smaller errands and factoring in rest breaks.  Recommended making list and ranking from easy to hard to allow spacing and prioritizing of tasks to plan each day as pt reports that he will over think things. Grip strength: See above for grip strength.  Engaged in grip strength with 100# grip strength trainer with pt grasping and holding up to 5 seconds x2 in each hand.  OT reviewed finger strengtheing with increased resistance theraputty, purchasing and grip  trainer, and/or with rubber band. OT providing demonstration for finger flexion and extension with rubber band.  Return to work: OT provided RTW handout from [employer] and encouraged pt to go through checklist with spouse to address areas that are still of concern to address and discuss possible accommodations and he approaches being medically cleared to return to any structured work.  Pt able to recognize a few areas initially that are currently an issue and areas that have improved.      09/25/22 UE therex: OT providing education with verbal and demonstration cues for hand placement and technique to facilitate increased quality of movement. - Ulnar Nerve Flossing   - Standing Ulnar Nerve Glide   - Ulnar Nerve Butterfly  Hand Gripper: with LUE on level 55# with black spring. Pt picked up 1 inch blocks with gripper with 0 drops and min difficulty.  Increased challenge to  complete with LUE on level 70# with gold spring. Pt picked up 1 inch blocks with gripper with 0 drops and min difficulty.  OT further increased challenge to placing target to L of body to facilitate increased reach and address ulnar innervation.  Pt demonstrating good sustained grasp, dropping only 1 block due to onset of fatigue. Energy conservation: educated on 4P's of energy conservation (planning, prioritizing, pacing, and positioning) and providing cues and examples for each.     09/17/22: To help him manage scar and pain, OT edu on scar massage and progressive desensitization using a towel and a vibration. He states feeling better afterward. Due to drop foot c/o he was edu to monitor fatigue and gait patterns and not drag toes. Next, he was edu on and given hand strength activities in various patterns (as below) with putty. He does well, needs cues for form, gets some fatigue, but overall states less pain at end of session in scar.   Exercises - Ulnar Nerve Flossing  - 3-4 x daily - 1-2 sets - 5-10 reps - Full Fist  - 2-3 x daily - 5 reps - "Duck Mouth" Strength  - 2-3 x daily - 5 reps - Finger Extension "Pizza!"   - 2-3 x daily - 5 reps - Finger Key Grip with Putty  - 2-3 x daily - 5 reps - Cutting Putty  - 2-3 x daily - 5 reps - Thumb Opposition with Putty  - 2-3 x daily - 5 reps Patient Education - Scar Massage & Desensitize   PATIENT EDUCATION: Education details: Educated on role and purpose of OT as well as potential interventions and goals for therapy based on initial evaluation findings. Person educated: Patient and Spouse Education method: Explanation Education comprehension: verbalized understanding and needs further education  HOME EXERCISE PROGRAM: Access Code: [email or web address] URL: [email or web address] Date: 09/17/2022    GOALS: Goals reviewed with patient? Yes  LONG TERM GOALS: Target date: 10/10/22  Pt will be independent with hand/pinch strengthening  HEP. Baseline:  Goal status: MET - 10/09/22  2.  Pt will verbalize understanding of energy conservation strategies to increase safety and independence with ADLs and IADLs. Baseline:  Goal status: MET - 10/09/22  3.  Pt will demonstrate improved L grip strength by 5# to allow for increased ease with opening containers and sustained grasp as needed for ADLs/IADLs, and return to work. Baseline: Right: 76.5 lbs; Left: 61 lbs,  Goal status: MET - 95# bilaterally 10/09/22  4.  Pt will demonstrate improved activity tolerance to allow for return to short shopping trips at ambulatory level.  Baseline:  Goal status: MET - 10/09/22  5.  Pt will report understanding of recommendations in regards to safe return to work. Baseline:  Goal status: IN PROGRESS   ASSESSMENT:  CLINICAL IMPRESSION: Pt with reports of ongoing sensitivity to touch and stimulation along abdominal incision.  OT reiterate small amounts of massage, completion in the shower, and trialing different types of material to aid in desensitization. Pt demonstrating improved grip strength and verbalizing increasing endurance as walking up to 2.5 miles with spouse on paved trail.  Pt reporting understanding of recommendation with energy conservation strategies and making a list to allow for him to process tasks and organize to continue to focus on pacing self.  Pt appreciative of check list for return to work and plans to complete with spouse and if areas still persisting that would be applicable to OT pt to return to address areas and problem solve accommodations in regards to return to work.  Pt has shown improvements in most areas, meeting 4/5 goals. At this time, skilled occupational therapy services are no longer indicated. Pt and OT discussed plan to defer?for ~4 weeks to determine continued necessity of skilled OT services. Pt encouraged to call back before that time with any changes or if any relevant functional deficits develop/occur. Pt to  be discharged from OP OT if no further therapy is warranted by 11/07/22.   PLAN:  OT FREQUENCY: 1x/week  OT DURATION: 4 weeks  PLANNED INTERVENTIONS: therapeutic exercise, therapeutic activity, neuromuscular re-education, manual therapy, balance training, functional mobility training, compression bandaging, moist heat, cryotherapy, patient/family education, energy conservation, coping strategies training, and DME and/or AE instructions  CONSULTED AND AGREED WITH PLAN OF CARE: Patient and family member/caregiver  PLAN FOR NEXT SESSION:  check on scar pain,  RTW discussion (still at least 3 months out from return to work) vs d/c if no return call in ~30 days.  Rosalio Loud, OTR/L 10/09/2022, 9:38 AM     OCCUPATIONAL THERAPY DISCHARGE SUMMARY  Visits from Start of Care: 4  Current functional level related to goals / functional outcomes: Pt had met 4 of 5 LTGs at last visit. Per last visit note: "At this time, skilled occupational therapy services are no longer indicated. Pt and OT discussed plan to defer?for ~4 weeks to determine continued necessity of skilled OT services. Pt encouraged to call back before that time with any changes or if any relevant functional deficits develop/occur." Pt did not return or call to return, therefore recommend d/c.   Remaining deficits: Scar pain   Education / Equipment: Energy conservation, return to work recommendations, HEP for ROM, strengthening, and scar massage and desensitization.    Patient agrees to discharge. Patient goals were partially met. Patient is being discharged due to meeting the stated rehab goals.  Rosalio Loud, OT 07/20/23

## 2022-10-14 ENCOUNTER — Ambulatory Visit: Payer: 59 | Admitting: Physical Therapy

## 2022-10-14 ENCOUNTER — Encounter: Payer: Self-pay | Admitting: Rehabilitative and Restorative Service Providers"

## 2022-10-14 ENCOUNTER — Ambulatory Visit: Payer: 59 | Attending: Surgery | Admitting: Rehabilitative and Restorative Service Providers"

## 2022-10-14 DIAGNOSIS — R2681 Unsteadiness on feet: Secondary | ICD-10-CM | POA: Diagnosis present

## 2022-10-14 DIAGNOSIS — R2689 Other abnormalities of gait and mobility: Secondary | ICD-10-CM | POA: Diagnosis present

## 2022-10-14 DIAGNOSIS — M6281 Muscle weakness (generalized): Secondary | ICD-10-CM | POA: Diagnosis present

## 2022-10-14 NOTE — Therapy (Signed)
OUTPATIENT PHYSICAL THERAPY TREATMENT   Patient Name: Gabriel Dalton MRN: [id number] DOB:1985/12/06, 37 y.o., male Today's Date: 10/14/2022   PCP: Steffanie Rainwater, MD/to transition to Dr. Berkley Harvey PROVIDER: Gust Rung, DO   END OF SESSION:  PT End of Session - 10/14/22 0854     Visit Number 8    Date for PT Re-Evaluation 10/24/22    Authorization Type U[employer]    Authorization - Visit Number 8    Authorization - Number of Visits 50    PT Start Time 0850    PT Stop Time 0930    PT Time Calculation (min) 40 min    Activity Tolerance Patient tolerated treatment well    Behavior During Therapy [facility] for tasks assessed/performed               Past Medical History:  Diagnosis Date   Abdominal fluid collection 07/11/2022   BPH (benign prostatic hyperplasia)    Bronchitis    Melena 04/25/2019   Multiple gastric ulcers    Peritonitis (HCC) 06/26/2022   Past Surgical History:  Procedure Laterality Date   CHOLECYSTECTOMY N/A 06/25/2022   Procedure: LAPAROSCOPIC CHOLECYSTECTOMY;  Surgeon: Quentin Ore, MD;  Location: [facility];  Service: General;  Laterality: N/A;   COLONOSCOPY     I & D EXTREMITY  10/18/2011   Procedure: IRRIGATION AND DEBRIDEMENT EXTREMITY;  Surgeon: Dominica Severin, MD;  Location: [facility];  Service: Orthopedics;  Laterality: Right;  Irrigation and Debridement  Right Middle Finger; Removal of foreign body   LAPAROTOMY N/A 06/27/2022   Procedure: EXPLORATORY LAPAROTOMY, WASH OUT,  ABDOMINAL CLOSURE;  Surgeon: Quentin Ore, MD;  Location: [facility];  Service: General;  Laterality: N/A;   Patient Active Problem List   Diagnosis Date Noted   Lumbar spondylosis with myelopathy 09/03/2022   Heart murmur 09/03/2022   Vitamin D deficiency 08/01/2022   Thiamine deficiency 08/01/2022   Electrolyte abnormality 07/09/2022   Benign prostatic hyperplasia 07/09/2022   Severe protein-calorie malnutrition (HCC) 06/25/2022   Small bowel  perforation (HCC) 06/25/2022   Cholelithiasis 06/24/2022   Early satiety 05/27/2022   Nausea and vomiting 05/27/2022   Esophagitis determined by endoscopy 04/23/2022   Abnormal colonoscopy 12/26/2021   Gastroesophageal reflux disease 12/26/2021   Normocytic anemia 04/25/2019   Gastric ulcers 04/25/2019   Generalized abdominal pain 04/01/2019    ONSET DATE: 09/03/2022 (MD referral); abdominal surgery 06/2022  REFERRING DIAG:  M47.816 (ICD-10-CM) - Lumbar spondylosis  M47.16 (ICD-10-CM) - Lumbar spondylosis with myelopathy    THERAPY DIAG:  Unsteadiness on feet  Muscle weakness (generalized)  Other abnormalities of gait and mobility  Rationale for Evaluation and Treatment: Rehabilitation  SUBJECTIVE:  SUBJECTIVE STATEMENT: Pt reports that his wife has had him walking on a trail more.  States that he still has abdominal pain, especially after eating.  Pt accompanied by: self  PERTINENT HISTORY:  Underwent exploratory laparotomy with small bowel resection with temporary abdominal closure on 06/25/22, and washout with abdominal closure on 06/27/22.  Patient goes to [facility] facility on 10/24/2022 to be fitted for AFO  PAIN:  Are you having pain? Yes: NPRS scale: 6/10 Pain location: belly on scar Pain description: bloating Aggravating factors: eating Relieving factors: bowel movement Low back pain when sleeping; 6-7/10 in back.  PRECAUTIONS: Other: Lifting restrictions  WEIGHT BEARING RESTRICTIONS: No  FALLS: Has patient fallen in last 6 months? Yes. Number of falls 1  LIVING ENVIRONMENT: Lives with: lives with their spouse Lives in: House/apartment Stairs: Yes: External: 6 steps; on right going up and on left going up Has following equipment at home: Single point cane  PLOF: Independent  and Vocation/Vocational requirements: truck driver  PATIENT GOALS: To get strength, balance, stability back  OBJECTIVE:   Below measures were taken at time of initial evaluation unless otherwise specified:   DIAGNOSTIC FINDINGS:  See MRI:  At L5-S1, there is mild disc degeneration. Small left subarticular disc protrusion (at site of posterior annular fissure). The disc protrusion results in slight left subarticular narrowing, and contacts the descending left S1 nerve root. No significant central canal stenosis.  COGNITION: Overall cognitive status: Within functional limits for tasks assessed   SENSATION: Light touch: Impaired   COORDINATION: Slightly slowed heel to shin  EDEMA:  Feet swell sometimes  POSTURE: No Significant postural limitations  LOWER EXTREMITY ROM:     Passive  Right Eval Left Eval  Hip flexion    Hip extension    Hip abduction    Hip adduction    Hip internal rotation    Hip external rotation    Knee flexion    Knee extension    Ankle dorsiflexion 12 12  Ankle plantarflexion    Ankle inversion    Ankle eversion     (Blank rows = not tested)  LOWER EXTREMITY MMT:    MMT Right Eval Left Eval  Hip flexion 4 4  Hip extension    Hip abduction    Hip adduction    Hip internal rotation    Hip external rotation    Knee flexion 4 3+  Knee extension 4+ 4+  Ankle dorsiflexion 1 1  Ankle plantarflexion 3- 3-  Ankle inversion 3- 3-  Ankle eversion 3- 3-  (Blank rows = not tested)   TRANSFERS: Assistive device utilized: None  Sit to stand: Modified independence Stand to sit: Modified independence  GAIT: Gait pattern: step through pattern, decreased step length- Right, decreased step length- Left, decreased ankle dorsiflexion- Right, decreased ankle dorsiflexion- Left, Right steppage, and Left steppage Distance walked: 50 ft x 2 Assistive device utilized: Single point cane Level of assistance: Modified independence  FUNCTIONAL TESTS:   Eval: 5 times sit to stand: 19.97 sec arms crossed at chest Timed up and go (TUG): 17.62 10 meter walk test: 13.25 sec cane (2.48 ft/sec) DGI :  12/24 SLS:  RLE 4 sec, LLE 1 sec  09/30/2022: 5 times sit to stand: 9.64 sec arms crossed at chest   TODAY'S TREATMENT:  Date:  10/14/2022 Nustep level 5 x6 min with PT present to discuss status Lumbar Traction: Intermittent 55# max, 35# min, hold 60 sec, rest 15 sec.  X15 minutes Ambulation in PT  clinic with [facility] and no loss of balance Seated:  LAQ with 1 sec hold at TKE.  x10 bilat (pt with some increased foot dorsiflexion noted on left foot) Seated attempt/visualization of ankle dorsiflexion x10   Date:  10/07/2022 Nustep level 2 x6 min with PT present to discuss status Seated:  LAQ with 1 sec hold at TKE.  2x10 bilat Standing hip abduction: standing on balance pad on supporting leg to modify for foot drop on opposite side 2x10 Seated hamstring curl with yellow loop 2x10 each bilat Sit to stand 2x10 (without UE support) Seated hip abduction: yellow loop 2x10 Seated wobble board: PF/DF, inversion/eversion- each foot x 15 each.  Pt has one at home and will do this to train ankle muscles. Alternating step taps: 4" step, 1x10 with UE support,  2x10 without UE support  Date:  09/30/2022 Nustep level 2 x6 min with PT present to discuss status Seated:  LAQ with 1 sec hold at TKE.  2x10 bilat Seated hip abduction with red theraband 2x10 Seated hamstring curl with red tband 2x10 each bilat Sit to stand x10 (without UE support) Sit to/from stand x5 Prone position x30 sec, then prone on elbows x30 sec Prone press-up x10 Prone hamstring curl with 2# ankle weights 2x10 bilat   PATIENT EDUCATION:  Education details: Issued HEP Person educated: Patient and wife Education method: Explanation, Demonstration, and Handouts Education comprehension: verbalized understanding and returned demonstration   HOME EXERCISE PROGRAM: Access Code:  [email or web address] URL: [email or web address] Date: 09/30/2022 Prepared by: Reather Laurence  Program Notes Seated heel digs:  Sit at edge of computer (rolling) chair:  dig your heels and pull forward/push back.  Do NOT use your back for pulling forward/pushing back.  Exercises - Sit to Stand  - 1 x daily - 5 x weekly - 5 reps - Seated Long Arc Quad  - 1 x daily - 7 x weekly - 2 sets - 10 reps - Seated Anterior Pelvic Tilt  - 1-2 x daily - 7 x weekly - 10 reps - Standing Lumbar Extension  - 1-2 x daily - 7 x weekly - 10 reps - Wall Quarter Squat  - 1 x daily - 7 x weekly - 10 reps - Standing Gastroc Stretch at Counter  - 1 x daily - 7 x weekly - 1 sets - 3 reps - 30 sec hold - Prone Knee Flexion  - 1 x daily - 7 x weekly - 2 sets - 10 reps - Prone Hip Extension  - 1 x daily - 7 x weekly - 2 sets - 10 reps - Supine Transversus Abdominis Bracing - Hands on Thighs  - 1 x daily - 7 x weekly - 2 sets - 10 reps   GOALS: Goals reviewed with patient? Yes  SHORT TERM GOALS: Target date: 10/10/2022  Pt will be independent with HEP for improved strength, balance, gait. Baseline: Goal status: MET  2.  Pt will report reduction in back pain by at least 50 % for improved functional mobility. Baseline: intermittent and 8/10 max (10/07/22) Goal status: IN PROGRESS  3.  Pt will improve 5x sit<>stand to less than or equal to 15 sec to demonstrate improved functional strength and transfer efficiency.  Baseline: 19.97 sec Goal status: MET on 09/30/2022  LONG TERM GOALS: Target date: 10/24/2022  Pt will be independent with HEP for improved strength, balance, pain, gait. Baseline:  Goal status: IN PROGRESS  2.  Pt will improve DGI score to at least 17/24  to decrease fall risk. Baseline: 12/24 Goal status: IN PROGRESS  3.  Pt will improve 5x sit<>stand to less than or equal to 11.4 sec to demonstrate improved functional strength and transfer efficiency. Baseline: 19.97 sec Goal status: MET on  09/30/2022  4.  Pt will improve gait velocity to at least 2.62 ft/sec for improved gait efficiency and safety. Baseline: 2.48 ft/sec with cane Goal status: IN PROGRESS  ASSESSMENT:  CLINICAL IMPRESSION: Mr Brodeur presents to skilled PT and is willing to try lumbar traction today.  Patient states that he has been using the wobble board at home, now that he knows how to utilize it.  Pt with good tolerance to lumbar traction at 55# hold with reports of "it actually feels good on my back." Patient reporting that he can feel some tingling in his feet that he usually only feels after a massage from his wife with traction. Patient is approaching the end of initial certification period and is due a reassessment next week for potential continued PT.  Patient discharged from OT services, per his report.  OBJECTIVE IMPAIRMENTS: Abnormal gait, decreased balance, decreased mobility, difficulty walking, decreased ROM, decreased strength, impaired flexibility, and impaired sensation.   ACTIVITY LIMITATIONS: carrying, lifting, bending, standing, stairs, transfers, and locomotion level  PARTICIPATION LIMITATIONS: driving, community activity, and occupation  PERSONAL FACTORS: 3+ comorbidities: see above; recent abdominal surgery 06/2022  are also affecting patient's functional outcome.   REHAB POTENTIAL: Good  CLINICAL DECISION MAKING: Evolving/moderate complexity  EVALUATION COMPLEXITY: Moderate  PLAN:  PT FREQUENCY: 2x/week  PT DURATION: 6 weeks  PLANNED INTERVENTIONS: Therapeutic exercises, Therapeutic activity, Neuromuscular re-education, Balance training, Gait training, Patient/Family education, Self Care, Joint mobilization, Dry Needling, Electrical stimulation, and Manual therapy  PLAN FOR NEXT SESSION: Assess response to traction, appointment for AFO on 10/24/2022; Continue extension/McKenzie exercises for low back (L5-S1), dry needling/manual therapy if indicated. Balance, and strength  progression.  Also work on safety with mobility-trial AFO, BLE strengthening.   Reather Laurence, PT 10/14/22 9:38 AM   [facility] [address], [address] [place], [place] [address] Phone # [phone] Fax [phone]

## 2022-10-16 ENCOUNTER — Ambulatory Visit: Payer: 59 | Admitting: Physical Therapy

## 2022-10-16 ENCOUNTER — Encounter: Payer: Self-pay | Admitting: Physical Therapy

## 2022-10-16 DIAGNOSIS — R2689 Other abnormalities of gait and mobility: Secondary | ICD-10-CM

## 2022-10-16 DIAGNOSIS — R2681 Unsteadiness on feet: Secondary | ICD-10-CM

## 2022-10-16 DIAGNOSIS — M6281 Muscle weakness (generalized): Secondary | ICD-10-CM

## 2022-10-16 NOTE — Therapy (Signed)
OUTPATIENT PHYSICAL THERAPY TREATMENT   Patient Name: Gabriel Dalton MRN: [id number] DOB:June 29, 1985, 37 y.o., male Today's Date: 10/16/2022   PCP: Steffanie Rainwater, MD/to transition to Dr. Berkley Harvey PROVIDER: Gust Rung, DO   END OF SESSION:  PT End of Session - 10/16/22 0938     Visit Number 9    Number of Visits 13    Date for PT Re-Evaluation 10/24/22    Authorization Type UHC    Authorization - Visit Number 9    Authorization - Number of Visits 50    PT Start Time 0930    PT Stop Time 1012    PT Time Calculation (min) 42 min    Activity Tolerance Patient tolerated treatment well    Behavior During Therapy [facility] for tasks assessed/performed                Past Medical History:  Diagnosis Date   Abdominal fluid collection 07/11/2022   BPH (benign prostatic hyperplasia)    Bronchitis    Melena 04/25/2019   Multiple gastric ulcers    Peritonitis (HCC) 06/26/2022   Past Surgical History:  Procedure Laterality Date   CHOLECYSTECTOMY N/A 06/25/2022   Procedure: LAPAROSCOPIC CHOLECYSTECTOMY;  Surgeon: Quentin Ore, MD;  Location: [facility];  Service: General;  Laterality: N/A;   COLONOSCOPY     I & D EXTREMITY  10/18/2011   Procedure: IRRIGATION AND DEBRIDEMENT EXTREMITY;  Surgeon: Dominica Severin, MD;  Location: [facility];  Service: Orthopedics;  Laterality: Right;  Irrigation and Debridement  Right Middle Finger; Removal of foreign body   LAPAROTOMY N/A 06/27/2022   Procedure: EXPLORATORY LAPAROTOMY, WASH OUT,  ABDOMINAL CLOSURE;  Surgeon: Quentin Ore, MD;  Location: [facility];  Service: General;  Laterality: N/A;   Patient Active Problem List   Diagnosis Date Noted   Lumbar spondylosis with myelopathy 09/03/2022   Heart murmur 09/03/2022   Vitamin D deficiency 08/01/2022   Thiamine deficiency 08/01/2022   Electrolyte abnormality 07/09/2022   Benign prostatic hyperplasia 07/09/2022   Severe protein-calorie malnutrition (HCC)  06/25/2022   Small bowel perforation (HCC) 06/25/2022   Cholelithiasis 06/24/2022   Early satiety 05/27/2022   Nausea and vomiting 05/27/2022   Esophagitis determined by endoscopy 04/23/2022   Abnormal colonoscopy 12/26/2021   Gastroesophageal reflux disease 12/26/2021   Normocytic anemia 04/25/2019   Gastric ulcers 04/25/2019   Generalized abdominal pain 04/01/2019    ONSET DATE: 09/03/2022 (MD referral); abdominal surgery 06/2022  REFERRING DIAG:  M47.816 (ICD-10-CM) - Lumbar spondylosis  M47.16 (ICD-10-CM) - Lumbar spondylosis with myelopathy    THERAPY DIAG:  Unsteadiness on feet  Other abnormalities of gait and mobility  Muscle weakness (generalized)  Rationale for Evaluation and Treatment: Rehabilitation  SUBJECTIVE:  SUBJECTIVE STATEMENT:  Having a little belly pain this morning. Got some [facility] at [facility] last time, felt pretty good but had some increased tingling when I was on it. Nothing else new going on. No falls, did have a close call when I tripped on the lip on the floor going into my kitchen. I've been getting dizzy a lot when I stand up since my surgery, lasts about 10-15 seconds.   Pt accompanied by: self  PERTINENT HISTORY:  Underwent exploratory laparotomy with small bowel resection with temporary abdominal closure on 06/25/22, and washout with abdominal closure on 06/27/22.  Patient goes to [facility] facility on 10/24/2022 to be fitted for AFO  PAIN:  Are you having pain? Yes: NPRS scale: 5-6/10 Pain location: belly Pain description: bloating, tight feeling Aggravating factors: eating Relieving factors: bowel movement   PRECAUTIONS: Other: Lifting restrictions  WEIGHT BEARING RESTRICTIONS: No  FALLS: Has patient fallen in last 6 months? Yes. Number of falls  1  LIVING ENVIRONMENT: Lives with: lives with their spouse Lives in: House/apartment Stairs: Yes: External: 6 steps; on right going up and on left going up Has following equipment at home: Single point cane  PLOF: Independent and Vocation/Vocational requirements: truck driver  PATIENT GOALS: To get strength, balance, stability back  OBJECTIVE:   Below measures were taken at time of initial evaluation unless otherwise specified:   DIAGNOSTIC FINDINGS:  See MRI:  At L5-S1, there is mild disc degeneration. Small left subarticular disc protrusion (at site of posterior annular fissure). The disc protrusion results in slight left subarticular narrowing, and contacts the descending left S1 nerve root. No significant central canal stenosis.  COGNITION: Overall cognitive status: Within functional limits for tasks assessed   SENSATION: Light touch: Impaired   COORDINATION: Slightly slowed heel to shin  EDEMA:  Feet swell sometimes  POSTURE: No Significant postural limitations  LOWER EXTREMITY ROM:     Passive  Right Eval Left Eval  Hip flexion    Hip extension    Hip abduction    Hip adduction    Hip internal rotation    Hip external rotation    Knee flexion    Knee extension    Ankle dorsiflexion 12 12  Ankle plantarflexion    Ankle inversion    Ankle eversion     (Blank rows = not tested)  LOWER EXTREMITY MMT:    MMT Right Eval Left Eval  Hip flexion 4 4  Hip extension    Hip abduction    Hip adduction    Hip internal rotation    Hip external rotation    Knee flexion 4 3+  Knee extension 4+ 4+  Ankle dorsiflexion 1 1  Ankle plantarflexion 3- 3-  Ankle inversion 3- 3-  Ankle eversion 3- 3-  (Blank rows = not tested)   TRANSFERS: Assistive device utilized: None  Sit to stand: Modified independence Stand to sit: Modified independence  GAIT: Gait pattern: step through pattern, decreased step length- Right, decreased step length- Left, decreased  ankle dorsiflexion- Right, decreased ankle dorsiflexion- Left, Right steppage, and Left steppage Distance walked: 50 ft x 2 Assistive device utilized: Single point cane Level of assistance: Modified independence  FUNCTIONAL TESTS:  Eval: 5 times sit to stand: 19.97 sec arms crossed at chest Timed up and go (TUG): 17.62 10 meter walk test: 13.25 sec cane (2.48 ft/sec) DGI :  12/24 SLS:  RLE 4 sec, LLE 1 sec  09/30/2022: 5 times sit to stand: 9.64 sec arms  crossed at chest   TODAY'S TREATMENT:            Date:  10/16/22  Nustep L3 x6 minutes BLEs only Bridges + arch for extension x15  Checked abdominal scar mobility- discussed scar mobility techniques he can use at home, has some tight areas   Prone to elbows x10 with 3 second for lumbar extension  Prone press up x10 with 3 second holds  Quadruped hip ABD 2x10 B Quadruped hip extensions 2x10 B Mini squats in front of mat table x10 cues for good form Hip hikes x10 B cues to not hyperextend knee on stance LE Hip hikes + ABD x10 B cues to not hyperextend knee on stance LE LAQs yellow TB x10 B with 3 second holds      Date:  10/14/2022 Nustep level 5 x6 min with PT present to discuss status Lumbar Traction: Intermittent 55# max, 35# min, hold 60 sec, rest 15 sec.  X15 minutes Ambulation in PT clinic with [facility] and no loss of balance Seated:  LAQ with 1 sec hold at TKE.  x10 bilat (pt with some increased foot dorsiflexion noted on left foot) Seated attempt/visualization of ankle dorsiflexion x10   Date:  10/07/2022 Nustep level 2 x6 min with PT present to discuss status Seated:  LAQ with 1 sec hold at TKE.  2x10 bilat Standing hip abduction: standing on balance pad on supporting leg to modify for foot drop on opposite side 2x10 Seated hamstring curl with yellow loop 2x10 each bilat Sit to stand 2x10 (without UE support) Seated hip abduction: yellow loop 2x10 Seated wobble board: PF/DF, inversion/eversion- each foot x 15 each.  Pt  has one at home and will do this to train ankle muscles. Alternating step taps: 4" step, 1x10 with UE support,  2x10 without UE support  Date:  09/30/2022 Nustep level 2 x6 min with PT present to discuss status Seated:  LAQ with 1 sec hold at TKE.  2x10 bilat Seated hip abduction with red theraband 2x10 Seated hamstring curl with red tband 2x10 each bilat Sit to stand x10 (without UE support) Sit to/from stand x5 Prone position x30 sec, then prone on elbows x30 sec Prone press-up x10 Prone hamstring curl with 2# ankle weights 2x10 bilat   PATIENT EDUCATION:  Education details: Issued HEP Person educated: Patient and wife Education method: Explanation, Demonstration, and Handouts Education comprehension: verbalized understanding and returned demonstration   HOME EXERCISE PROGRAM: Access Code: [email or web address] URL: [email or web address] Date: 09/30/2022 Prepared by: Reather Laurence  Program Notes Seated heel digs:  Sit at edge of computer (rolling) chair:  dig your heels and pull forward/push back.  Do NOT use your back for pulling forward/pushing back.  Exercises - Sit to Stand  - 1 x daily - 5 x weekly - 5 reps - Seated Long Arc Quad  - 1 x daily - 7 x weekly - 2 sets - 10 reps - Seated Anterior Pelvic Tilt  - 1-2 x daily - 7 x weekly - 10 reps - Standing Lumbar Extension  - 1-2 x daily - 7 x weekly - 10 reps - Wall Quarter Squat  - 1 x daily - 7 x weekly - 10 reps - Standing Gastroc Stretch at Counter  - 1 x daily - 7 x weekly - 1 sets - 3 reps - 30 sec hold - Prone Knee Flexion  - 1 x daily - 7 x weekly - 2 sets - 10 reps - Prone Hip Extension  -  1 x daily - 7 x weekly - 2 sets - 10 reps - Supine Transversus Abdominis Bracing - Hands on Thighs  - 1 x daily - 7 x weekly - 2 sets - 10 reps   GOALS: Goals reviewed with patient? Yes  SHORT TERM GOALS: Target date: 10/10/2022  Pt will be independent with HEP for improved strength, balance, gait. Baseline: Goal  status: MET  2.  Pt will report reduction in back pain by at least 50 % for improved functional mobility. Baseline: intermittent and 8/10 max (10/07/22) Goal status: IN PROGRESS  3.  Pt will improve 5x sit<>stand to less than or equal to 15 sec to demonstrate improved functional strength and transfer efficiency.  Baseline: 19.97 sec Goal status: MET on 09/30/2022  LONG TERM GOALS: Target date: 10/24/2022  Pt will be independent with HEP for improved strength, balance, pain, gait. Baseline:  Goal status: IN PROGRESS  2.  Pt will improve DGI score to at least 17/24 to decrease fall risk. Baseline: 12/24 Goal status: IN PROGRESS  3.  Pt will improve 5x sit<>stand to less than or equal to 11.4 sec to demonstrate improved functional strength and transfer efficiency. Baseline: 19.97 sec Goal status: MET on 09/30/2022  4.  Pt will improve gait velocity to at least 2.62 ft/sec for improved gait efficiency and safety. Baseline: 2.48 ft/sec with cane Goal status: IN PROGRESS  ASSESSMENT:  CLINICAL IMPRESSION:  Pharaoh arrives today doing OK, felt like the traction was helpful. We will need to do re-assessment/recert on the 16XW, but if primary PT feels it is appropriate to extend POC at that time, we can certainly incorporate this. Otherwise worked on lumbar extension programming and general strengthening as time allowed this session. Will continue efforts.    OBJECTIVE IMPAIRMENTS: Abnormal gait, decreased balance, decreased mobility, difficulty walking, decreased ROM, decreased strength, impaired flexibility, and impaired sensation.   ACTIVITY LIMITATIONS: carrying, lifting, bending, standing, stairs, transfers, and locomotion level  PARTICIPATION LIMITATIONS: driving, community activity, and occupation  PERSONAL FACTORS: 3+ comorbidities: see above; recent abdominal surgery 06/2022  are also affecting patient's functional outcome.   REHAB POTENTIAL: Good  CLINICAL DECISION MAKING:  Evolving/moderate complexity  EVALUATION COMPLEXITY: Moderate  PLAN:  PT FREQUENCY: 2x/week  PT DURATION: 6 weeks  PLANNED INTERVENTIONS: Therapeutic exercises, Therapeutic activity, Neuromuscular re-education, Balance training, Gait training, Patient/Family education, Self Care, Joint mobilization, Dry Needling, Electrical stimulation, and Manual therapy  PLAN FOR NEXT SESSION: Recert/possible extension of POC on 6/13, appointment for AFO on 10/24/2022; Continue extension/McKenzie exercises for low back (L5-S1), dry needling/manual therapy if indicated. Balance, and strength progression.  Also work on safety with mobility-trial AFO, BLE strengthening.   Nedra Hai PT DPT PN2   [facility] [address] [place], [place] [address] Phone # [phone] Fax [phone]

## 2022-10-17 ENCOUNTER — Ambulatory Visit
Admission: RE | Admit: 2022-10-17 | Discharge: 2022-10-17 | Disposition: A | Discharge status: Home / Self Care | Payer: 59 | Source: Ambulatory Visit | Attending: Internal Medicine | Admitting: Internal Medicine

## 2022-10-17 DIAGNOSIS — M21371 Foot drop, right foot: Secondary | ICD-10-CM

## 2022-10-17 DIAGNOSIS — M4716 Other spondylosis with myelopathy, lumbar region: Secondary | ICD-10-CM

## 2022-10-17 MED ORDER — IOPAMIDOL (ISOVUE-M 200) INJECTION 41%
1.0000 mL | Freq: Once | INTRAMUSCULAR | Status: AC
  Administered 2022-10-17: 1 mL via EPIDURAL

## 2022-10-17 MED ORDER — METHYLPREDNISOLONE ACETATE 40 MG/ML INJ SUSP (RADIOLOG
80.0000 mg | Freq: Once | INTRAMUSCULAR | Status: AC
  Administered 2022-10-17: 80 mg via EPIDURAL

## 2022-10-17 NOTE — Discharge Instructions (Signed)

## 2022-10-21 ENCOUNTER — Telehealth: Payer: Self-pay | Admitting: Student

## 2022-10-21 ENCOUNTER — Ambulatory Visit: Payer: 59

## 2022-10-21 ENCOUNTER — Encounter: Payer: 59 | Admitting: Student

## 2022-10-21 ENCOUNTER — Ambulatory Visit: Payer: 59 | Admitting: Physical Therapy

## 2022-10-21 DIAGNOSIS — R2689 Other abnormalities of gait and mobility: Secondary | ICD-10-CM

## 2022-10-21 DIAGNOSIS — R2681 Unsteadiness on feet: Secondary | ICD-10-CM

## 2022-10-21 DIAGNOSIS — M6281 Muscle weakness (generalized): Secondary | ICD-10-CM

## 2022-10-21 NOTE — Telephone Encounter (Signed)
Rec'd call from the pt requesting a Referral.  Pt states he had his Spinal Injection on 10/17/2022 and it is still having a lot of pain. The pt states he was to contact his PCP if his pain still persists.   Pt states, "he is not having any relief" and is now requesting to be referred out to see a Spine Specialist instead.  Will this pt need to have an office visit before the referral can be placed?

## 2022-10-21 NOTE — Therapy (Signed)
OUTPATIENT PHYSICAL THERAPY TREATMENT   Patient Name: Gabriel Dalton MRN: [id number] DOB:[birth year], 37 y.o., male Today's Date: 10/21/2022   PCP: Steffanie Rainwater, MD/to transition to Dr. Berkley Harvey PROVIDER: Gust Rung, DO   END OF SESSION:  PT End of Session - 10/21/22 1011     Visit Number 10    Date for PT Re-Evaluation 10/24/22    Authorization Type [employer]    Authorization - Visit Number 10    Authorization - Number of Visits 50    PT Start Time 0933    PT Stop Time 1028    PT Time Calculation (min) 55 min    Activity Tolerance Patient tolerated treatment well    Behavior During Therapy [facility] for tasks assessed/performed                 Past Medical History:  Diagnosis Date   Abdominal fluid collection 07/11/2022   BPH (benign prostatic hyperplasia)    Bronchitis    Melena 04/25/2019   Multiple gastric ulcers    Peritonitis (HCC) 06/26/2022   Past Surgical History:  Procedure Laterality Date   CHOLECYSTECTOMY N/A 06/25/2022   Procedure: LAPAROSCOPIC CHOLECYSTECTOMY;  Surgeon: Quentin Ore, MD;  Location: [facility];  Service: General;  Laterality: N/A;   COLONOSCOPY     I & D EXTREMITY  10/18/2011   Procedure: IRRIGATION AND DEBRIDEMENT EXTREMITY;  Surgeon: Dominica Severin, MD;  Location: [facility];  Service: Orthopedics;  Laterality: Right;  Irrigation and Debridement  Right Middle Finger; Removal of foreign body   LAPAROTOMY N/A 06/27/2022   Procedure: EXPLORATORY LAPAROTOMY, WASH OUT,  ABDOMINAL CLOSURE;  Surgeon: Quentin Ore, MD;  Location: [facility];  Service: General;  Laterality: N/A;   Patient Active Problem List   Diagnosis Date Noted   Lumbar spondylosis with myelopathy 09/03/2022   Heart murmur 09/03/2022   Vitamin D deficiency 08/01/2022   Thiamine deficiency 08/01/2022   Electrolyte abnormality 07/09/2022   Benign prostatic hyperplasia 07/09/2022   Severe protein-calorie malnutrition (HCC) 06/25/2022   Small  bowel perforation (HCC) 06/25/2022   Cholelithiasis 06/24/2022   Early satiety 05/27/2022   Nausea and vomiting 05/27/2022   Esophagitis determined by endoscopy 04/23/2022   Abnormal colonoscopy 12/26/2021   Gastroesophageal reflux disease 12/26/2021   Normocytic anemia 04/25/2019   Gastric ulcers 04/25/2019   Generalized abdominal pain 04/01/2019    ONSET DATE: 09/03/2022 (MD referral); abdominal surgery 06/2022  REFERRING DIAG:  M47.816 (ICD-10-CM) - Lumbar spondylosis  M47.16 (ICD-10-CM) - Lumbar spondylosis with myelopathy    THERAPY DIAG:  Unsteadiness on feet  Muscle weakness (generalized)  Other abnormalities of gait and mobility  Rationale for Evaluation and Treatment: Rehabilitation  SUBJECTIVE:  SUBJECTIVE STATEMENT: I think that traction really helped me and I want to do it again.  I can wiggle my toes a little bit now but still can't lift my foot.  PT has helped me,  I feel stronger and I can do more at home.   Pt accompanied by: self  PERTINENT HISTORY:  Underwent exploratory laparotomy with small bowel resection with temporary abdominal closure on 06/25/22, and washout with abdominal closure on 06/27/22.  Patient goes to [facility] on 10/24/2022 to be fitted for AFO  PAIN:  Are you having pain? Yes: NPRS scale: 0/10, up to 8/10 one time after yardwork, otherwise no pain Pain location: low back Pain description: bloating, tight feeling Aggravating factors: eating Relieving factors: bowel movement   PRECAUTIONS: Other: Lifting restrictions  WEIGHT BEARING RESTRICTIONS: No  FALLS: Has patient fallen in last 6 months? Yes. Number of falls 1  LIVING ENVIRONMENT: Lives with: lives with their spouse Lives in: House/apartment Stairs: Yes: External: 6 steps; on right going  up and on left going up Has following equipment at home: Single point cane  PLOF: Independent and Vocation/Vocational requirements: truck driver  PATIENT GOALS: To get strength, balance, stability back  OBJECTIVE:   Below measures were taken at time of initial evaluation unless otherwise specified:   DIAGNOSTIC FINDINGS:  See MRI:  At L5-S1, there is mild disc degeneration. Small left subarticular disc protrusion (at site of posterior annular fissure). The disc protrusion results in slight left subarticular narrowing, and contacts the descending left S1 nerve root. No significant central canal stenosis.  COGNITION: Overall cognitive status: Within functional limits for tasks assessed   SENSATION: Light touch: Impaired   COORDINATION: Slightly slowed heel to shin  EDEMA:  Feet swell sometimes  POSTURE: No Significant postural limitations  LOWER EXTREMITY ROM:     Passive  Right Eval Left Eval  Hip flexion    Hip extension    Hip abduction    Hip adduction    Hip internal rotation    Hip external rotation    Knee flexion    Knee extension    Ankle dorsiflexion 12 12  Ankle plantarflexion    Ankle inversion    Ankle eversion     (Blank rows = not tested)  LOWER EXTREMITY MMT:    MMT Right Eval Left Eval  Hip flexion 4 4  Hip extension    Hip abduction    Hip adduction    Hip internal rotation    Hip external rotation    Knee flexion 4 3+  Knee extension 4+ 4+  Ankle dorsiflexion 1 1  Ankle plantarflexion 3- 3-  Ankle inversion 3- 3-  Ankle eversion 3- 3-  (Blank rows = not tested)   TRANSFERS: Assistive device utilized: None  Sit to stand: Modified independence Stand to sit: Modified independence  GAIT: Gait pattern: step through pattern, decreased step length- Right, decreased step length- Left, decreased ankle dorsiflexion- Right, decreased ankle dorsiflexion- Left, Right steppage, and Left steppage Distance walked: 50 ft x 2 Assistive  device utilized: Single point cane Level of assistance: Modified independence  FUNCTIONAL TESTS:  Eval: 5 times sit to stand: 19.97 sec arms crossed at chest Timed up and go (TUG): 17.62 10 meter walk test: 13.25 sec cane (2.48 ft/sec) DGI :  12/24 SLS:  RLE 4 sec, LLE 1 sec  09/30/2022: 5 times sit to stand: 9.64 sec arms crossed at chest 6.11.24: 5x sit to stand: 10 seconds    TODAY'S  TREATMENT:  Date:  10/21/22  Nustep L3 x8  minutes BLEs only Sit to stand: 10# kettlebell 2x15     Alternating step tap on 6" step without UE support 2x10 alternating Leg press: seat 6 70# 2x10 bil, 40# single leg 2x10 , single leg 40# Rt and Lt 2x10 each  Mini squats at barre 2x10 cues for good form  Lumbar Traction: Intermittent 55# max, 35# min, hold 60 sec, rest 15 sec.  X15 minutes            Date:  10/16/22  Nustep L3 x6 minutes BLEs only Bridges + arch for extension x15  Checked abdominal scar mobility- discussed scar mobility techniques he can use at home, has some tight areas   Prone to elbows x10 with 3 second for lumbar extension  Prone press up x10 with 3 second holds  Quadruped hip ABD 2x10 B Quadruped hip extensions 2x10 B Mini squats in front of mat table x10 cues for good form Hip hikes x10 B cues to not hyperextend knee on stance LE Hip hikes + ABD x10 B cues to not hyperextend knee on stance LE LAQs yellow TB x10 B with 3 second holds      Date:  10/14/2022 Nustep level 5 x6 min with PT present to discuss status Lumbar Traction: Intermittent 55# max, 35# min, hold 60 sec, rest 15 sec.  X15 minutes Ambulation in PT clinic with SPC and no loss of balance Seated:  LAQ with 1 sec hold at TKE.  x10 bilat (pt with some increased foot dorsiflexion noted on left foot) Seated attempt/visualization of ankle dorsiflexion x10   PATIENT EDUCATION:  Education details: Issued HEP Person educated: Patient and wife Education method: Explanation, Demonstration, and Handouts Education  comprehension: verbalized understanding and returned demonstration   HOME EXERCISE PROGRAM: Access Code: [email or web address] URL: [email or web address] Date: 09/30/2022 Prepared by: Reather Laurence  Program Notes Seated heel digs:  Sit at edge of computer (rolling) chair:  dig your heels and pull forward/push back.  Do NOT use your back for pulling forward/pushing back.  Exercises - Sit to Stand  - 1 x daily - 5 x weekly - 5 reps - Seated Long Arc Quad  - 1 x daily - 7 x weekly - 2 sets - 10 reps - Seated Anterior Pelvic Tilt  - 1-2 x daily - 7 x weekly - 10 reps - Standing Lumbar Extension  - 1-2 x daily - 7 x weekly - 10 reps - Wall Quarter Squat  - 1 x daily - 7 x weekly - 10 reps - Standing Gastroc Stretch at Counter  - 1 x daily - 7 x weekly - 1 sets - 3 reps - 30 sec hold - Prone Knee Flexion  - 1 x daily - 7 x weekly - 2 sets - 10 reps - Prone Hip Extension  - 1 x daily - 7 x weekly - 2 sets - 10 reps - Supine Transversus Abdominis Bracing - Hands on Thighs  - 1 x daily - 7 x weekly - 2 sets - 10 reps   GOALS: Goals reviewed with patient? Yes  SHORT TERM GOALS: Target date: 10/10/2022  Pt will be independent with HEP for improved strength, balance, gait. Baseline: Goal status: MET  2.  Pt will report reduction in back pain by at least 50 % for improved functional mobility. Baseline: intermittent and 8/10 max (10/07/22) Goal status: IN PROGRESS  3.  Pt will improve 5x sit<>stand to less than  or equal to 15 sec to demonstrate improved functional strength and transfer efficiency.  Baseline: 10 seconds  Goal status: MET on 09/30/2022  LONG TERM GOALS: Target date: 10/24/2022  Pt will be independent with HEP for improved strength, balance, pain, gait. Baseline:  Goal status: IN PROGRESS  2.  Pt will improve DGI score to at least 17/24 to decrease fall risk. Baseline: 12/24 Goal status: IN PROGRESS  3.  Pt will improve 5x sit<>stand to less than or equal to 11.4 sec  to demonstrate improved functional strength and transfer efficiency. Baseline: 10 seconds  Goal status: MET on 09/30/2022  4.  Pt will improve gait velocity to at least 2.62 ft/sec for improved gait efficiency and safety. Baseline: 2.48 ft/sec with cane Goal status: IN PROGRESS  ASSESSMENT:  CLINICAL IMPRESSION: Pt reports that he can now wiggle his toes and that his overall mobility is improved including gait and endurance for functional tasks.  He was receptive to lumbar traction again today as he had good response last session. He reports minimal to no lumbar pain unless he does yard work or long periods of heavy housework.  He did well with advancement of strength exercises today including kettle bell and leg press.  Pt will have reassessment next session.  Patient will benefit from skilled PT to address the below impairments and improve overall function.    OBJECTIVE IMPAIRMENTS: Abnormal gait, decreased balance, decreased mobility, difficulty walking, decreased ROM, decreased strength, impaired flexibility, and impaired sensation.   ACTIVITY LIMITATIONS: carrying, lifting, bending, standing, stairs, transfers, and locomotion level  PARTICIPATION LIMITATIONS: driving, community activity, and occupation  PERSONAL FACTORS: 3+ comorbidities: see above; recent abdominal surgery 06/2022  are also affecting patient's functional outcome.   REHAB POTENTIAL: Good  CLINICAL DECISION MAKING: Evolving/moderate complexity  EVALUATION COMPLEXITY: Moderate  PLAN:  PT FREQUENCY: 2x/week  PT DURATION: 6 weeks  PLANNED INTERVENTIONS: Therapeutic exercises, Therapeutic activity, Neuromuscular re-education, Balance training, Gait training, Patient/Family education, Self Care, Joint mobilization, Dry Needling, Electrical stimulation, and Manual therapy  PLAN FOR NEXT SESSION: Recert/possible extension of POC on 6/13, appointment for AFO on 10/24/2022; Continue extension/McKenzie exercises for low  back (L5-S1), dry needling/manual therapy if indicated. Balance, and strength progression.  Also work on safety with mobility-trial AFO, BLE strengthening.   Lorrene Reid, PT 10/21/22 10:33 AM   [facility] [address], [address] [place], [place] [address] Phone # [phone] Fax [phone]

## 2022-10-22 ENCOUNTER — Ambulatory Visit (INDEPENDENT_AMBULATORY_CARE_PROVIDER_SITE_OTHER): Payer: 59

## 2022-10-22 ENCOUNTER — Other Ambulatory Visit: Payer: Self-pay

## 2022-10-22 VITALS — BP 105/75 | HR 76 | Temp 98.2°F | Ht 68.0 in | Wt 116.6 lb

## 2022-10-22 DIAGNOSIS — M4716 Other spondylosis with myelopathy, lumbar region: Secondary | ICD-10-CM

## 2022-10-22 DIAGNOSIS — I700 Atherosclerosis of aorta: Secondary | ICD-10-CM | POA: Diagnosis not present

## 2022-10-22 DIAGNOSIS — R1084 Generalized abdominal pain: Secondary | ICD-10-CM

## 2022-10-22 MED ORDER — METHOCARBAMOL 500 MG PO TABS: 1500.0000 mg | ORAL_TABLET | Freq: Three times a day (TID) | ORAL | 1 refills | Status: DC

## 2022-10-22 NOTE — Patient Instructions (Addendum)
Mr.Quinlin Dorrell Guidice, it was a pleasure seeing you today!  Today we discussed: Back Pain - try another round of injections. Take robaxin 1500 mg three times daily. Let's schedule telehealth visit in 1 month to discuss any improvement.  Will call if labs abnormal.  I have ordered the following labs today:  Lab Orders         Lipid Profile       Tests ordered today:  none  Referrals ordered today:   Referral Orders  No referral(s) requested today     I have ordered the following medication/changed the following medications:   Stop the following medications: Medications Discontinued During This Encounter  Medication Reason   methocarbamol (ROBAXIN) 500 MG tablet Reorder     Start the following medications: Meds ordered this encounter  Medications   methocarbamol (ROBAXIN) 500 MG tablet    Sig: Take 3 tablets (1,500 mg total) by mouth 3 (three) times daily.    Dispense:  90 tablet    Refill:  1     Follow-up:  1 month (telehealth)    Please make sure to arrive 15 minutes prior to your next appointment. If you arrive late, you may be asked to reschedule.   We look forward to seeing you next time. Please call our clinic at [phone] if you have any questions or concerns. The best time to call is Monday-Friday from 9am-4pm, but there is someone available 24/7. If after hours or the weekend, call the main hospital number and ask for the Internal Medicine Resident On-Call. If you need medication refills, please notify your pharmacy one week in advance and they will send [place] a request.  Thank you for letting us take part in your care. Wishing you the best!  Thank you, Adron Bene, MD

## 2022-10-22 NOTE — Progress Notes (Signed)
   CC: Back pain  HPI:  [name] is a 37 y.o. male with past medical history as below who presents with low back pain.  Please see detailed assessment and plan for HPI.  Past Medical History:  Diagnosis Date   Abdominal fluid collection 07/11/2022   BPH (benign prostatic hyperplasia)    Bronchitis    Melena 04/25/2019   Multiple gastric ulcers    Peritonitis (HCC) 06/26/2022   Review of Systems: Please see detailed assessment plan for pertinent ROS.  Physical Exam:  Vitals:   10/22/22 1102  BP: 105/75  Pulse: 76  Temp: 98.2 F (36.8 C)  TempSrc: Oral  SpO2: 100%  Weight: 116 lb 9.6 oz (52.9 kg)  Height: 5\' 8"  (1.727 m)   Physical Exam Constitutional:      General: He is not in acute distress. HENT:     Head: Normocephalic and atraumatic.  Eyes:     Extraocular Movements: Extraocular movements intact.  Cardiovascular:     Rate and Rhythm: Normal rate and regular rhythm.     Heart sounds: No murmur heard. Pulmonary:     Effort: Pulmonary effort is normal.     Breath sounds: No wheezing or rales.  Musculoskeletal:     Cervical back: Neck supple.     Right lower leg: No edema.     Left lower leg: No edema.     Comments: Negative straight leg raise bilaterally.  Skin:    General: Skin is warm and dry.  Neurological:     Mental Status: He is alert and oriented to person, place, and time.     Comments: 1+ strength with dorsiflexion of bilateral ankles.  Diminished sensation at posterior and lateral thighs, circumferentially about the lower legs and feet.  Steppage gait.  Psychiatric:        Mood and Affect: Mood normal.        Behavior: Behavior normal.      Assessment & Plan:   See Encounters Tab for problem based charting.  Lumbar spondylosis with myelopathy Patient presents with chronic low back pain with bilateral radicular symptoms, weakness, sensation loss.  Thus far, he has been treated with muscle relaxers, gabapentin, and  recently with an epidural steroid injection at L5-S1.  He reports that he has been unable to appreciate any symptom improvement since injection 5 days ago.  He has significant foot drop bilaterally.  MRI few months ago revealed spondylosis and mild disc protrusion at S1 nerve root but no central canal stenosis.  I am unsure of the etiology behind this man's low back problems given no history of back injury, normal weight, and absence of other risk factors.  He denies urinary or bowel incontinence at this point.  I do however feel his symptoms at this juncture reflect possible worsening when compared to image findings from February.  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 -Will see if this injection works, if not we will try with at least 1 more before referring to neurosurgery -Increase Robaxin to 1500 mg 3 times daily -Continue gabapentin -Avoid NSAIDs given history of gastric ulcers  Aortic calcification (HCC) Patient had incidental finding on recent abdominal CT of scattered calcifications in the aorta.  This is suspicious for possible familial hypercholesterolemia given his age. -Screen for lipid disorder  Patient discussed with Dr. Mikey Bussing

## 2022-10-22 NOTE — Assessment & Plan Note (Signed)
Patient presents with chronic low back pain with bilateral radicular symptoms, weakness, sensation loss.  Thus far, he has been treated with muscle relaxers, gabapentin, and recently with an epidural steroid injection at L5-S1.  He reports that he has been unable to appreciate any symptom improvement since injection 5 days ago.  He has significant foot drop bilaterally.  MRI few months ago revealed spondylosis and mild disc protrusion at S1 nerve root but no central canal stenosis.  I am unsure of the etiology behind this man's low back problems given no history of back injury, normal weight, and absence of other risk factors.  He denies urinary or bowel incontinence at this point.  I do however feel his symptoms at this juncture reflect possible worsening when compared to image findings from February.  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 -Will see if this injection works, if not we will try with at least 1 more before referring to neurosurgery -Increase Robaxin to 1500 mg 3 times daily -Continue gabapentin -Avoid NSAIDs given history of gastric ulcers

## 2022-10-22 NOTE — Assessment & Plan Note (Signed)
Patient had incidental finding on recent abdominal CT of scattered calcifications in the aorta.  This is suspicious for possible familial hypercholesterolemia given his age. -Screen for lipid disorder

## 2022-10-23 ENCOUNTER — Ambulatory Visit: Payer: 59 | Admitting: Physical Therapy

## 2022-10-23 ENCOUNTER — Encounter: Payer: Self-pay | Admitting: Physical Therapy

## 2022-10-23 DIAGNOSIS — R2689 Other abnormalities of gait and mobility: Secondary | ICD-10-CM

## 2022-10-23 DIAGNOSIS — M6281 Muscle weakness (generalized): Secondary | ICD-10-CM

## 2022-10-23 DIAGNOSIS — R2681 Unsteadiness on feet: Secondary | ICD-10-CM

## 2022-10-23 LAB — LIPID PANEL
Chol/HDL Ratio: 4.3 ratio (ref 0.0–5.0)
Cholesterol, Total: 226 mg/dL — ABNORMAL HIGH (ref 100–199)
HDL: 53 mg/dL (ref >39)
LDL Chol Calc (NIH): 154 mg/dL — ABNORMAL HIGH (ref 0–99)
Triglycerides: 109 mg/dL (ref 0–149)
VLDL Cholesterol Cal: 19 mg/dL (ref 5–40)

## 2022-10-23 NOTE — Addendum Note (Signed)
Addended by: Gean Maidens on: 10/23/2022 01:06 PM   Modules accepted: Orders

## 2022-10-23 NOTE — Therapy (Addendum)
OUTPATIENT PHYSICAL THERAPY TREATMENT/RECERT   Patient Name: Gabriel Dalton MRN: [id number] DOB:Oct 27, 1985, 37 y.o., male Today's Date: 10/23/2022   PCP: Steffanie Rainwater, MD/to transition to Dr. Berkley Harvey PROVIDER: Gust Rung, DO   END OF SESSION:  PT End of Session - 10/23/22 0935     Visit Number 11    Number of Visits 23    Date for PT Re-Evaluation 12/04/22    Authorization Type [employer]    Authorization - Visit Number 11    Authorization - Number of Visits 50    PT Start Time 0935    PT Stop Time 1015    PT Time Calculation (min) 40 min    Equipment Utilized During Treatment Gait belt    Activity Tolerance Patient tolerated treatment well    Behavior During Therapy [facility] for tasks assessed/performed                 Past Medical History:  Diagnosis Date   Abdominal fluid collection 07/11/2022   BPH (benign prostatic hyperplasia)    Bronchitis    Melena 04/25/2019   Multiple gastric ulcers    Peritonitis (HCC) 06/26/2022   Past Surgical History:  Procedure Laterality Date   CHOLECYSTECTOMY N/A 06/25/2022   Procedure: LAPAROSCOPIC CHOLECYSTECTOMY;  Surgeon: Quentin Ore, MD;  Location: [facility];  Service: General;  Laterality: N/A;   COLONOSCOPY     I & D EXTREMITY  10/18/2011   Procedure: IRRIGATION AND DEBRIDEMENT EXTREMITY;  Surgeon: Dominica Severin, MD;  Location: [facility];  Service: Orthopedics;  Laterality: Right;  Irrigation and Debridement  Right Middle Finger; Removal of foreign body   LAPAROTOMY N/A 06/27/2022   Procedure: EXPLORATORY LAPAROTOMY, WASH OUT,  ABDOMINAL CLOSURE;  Surgeon: Quentin Ore, MD;  Location: [facility];  Service: General;  Laterality: N/A;   Patient Active Problem List   Diagnosis Date Noted   Aortic calcification (HCC) 10/22/2022   Lumbar spondylosis with myelopathy 09/03/2022   Heart murmur 09/03/2022   Vitamin D deficiency 08/01/2022   Thiamine deficiency 08/01/2022   Electrolyte  abnormality 07/09/2022   Benign prostatic hyperplasia 07/09/2022   Severe protein-calorie malnutrition (HCC) 06/25/2022   Small bowel perforation (HCC) 06/25/2022   Cholelithiasis 06/24/2022   Early satiety 05/27/2022   Nausea and vomiting 05/27/2022   Esophagitis determined by endoscopy 04/23/2022   Abnormal colonoscopy 12/26/2021   Gastroesophageal reflux disease 12/26/2021   Normocytic anemia 04/25/2019   Gastric ulcers 04/25/2019   Generalized abdominal pain 04/01/2019    ONSET DATE: 09/03/2022 (MD referral); abdominal surgery 06/2022  REFERRING DIAG:  M47.816 (ICD-10-CM) - Lumbar spondylosis  M47.16 (ICD-10-CM) - Lumbar spondylosis with myelopathy    THERAPY DIAG:  Unsteadiness on feet  Muscle weakness (generalized)  Other abnormalities of gait and mobility  Rationale for Evaluation and Treatment: Rehabilitation  SUBJECTIVE:  SUBJECTIVE STATEMENT: Things going pretty good.  Still having some issues-numbness, weakness, soreness in legs.  Gradually having some more movement in L toes and foot.  Pt accompanied by: self  PERTINENT HISTORY:  Underwent exploratory laparotomy with small bowel resection with temporary abdominal closure on 06/25/22, and washout with abdominal closure on 06/27/22.  Patient goes to [facility] facility on 10/24/2022 to be fitted for AFO  PAIN:  Are you having pain? Yes: NPRS scale: 6/10 Pain location: low back Pain description: bloating, tight feeling Aggravating factors: eating Relieving factors: bowel movement   PRECAUTIONS: Other: Lifting restrictions  WEIGHT BEARING RESTRICTIONS: No  FALLS: Has patient fallen in last 6 months? Yes. Number of falls 1  LIVING ENVIRONMENT: Lives with: lives with their spouse Lives in: House/apartment Stairs: Yes: External:  6 steps; on right going up and on left going up Has following equipment at home: Single point cane  PLOF: Independent and Vocation/Vocational requirements: truck driver  PATIENT GOALS: To get strength, balance, stability back  OBJECTIVE:   Below measures were taken at time of initial evaluation unless otherwise specified:   DIAGNOSTIC FINDINGS:  See MRI:  At L5-S1, there is mild disc degeneration. Small left subarticular disc protrusion (at site of posterior annular fissure). The disc protrusion results in slight left subarticular narrowing, and contacts the descending left S1 nerve root. No significant central canal stenosis.  COGNITION: Overall cognitive status: Within functional limits for tasks assessed   SENSATION: Light touch: Impaired   COORDINATION: Slightly slowed heel to shin  EDEMA:  Feet swell sometimes  POSTURE: No Significant postural limitations  LOWER EXTREMITY ROM:     Passive  Right Eval Left Eval Right 10/23/2022 Left 10/23/2022  Hip flexion      Hip extension      Hip abduction      Hip adduction      Hip internal rotation      Hip external rotation      Knee flexion      Knee extension      Ankle dorsiflexion 12 12 18 18   Ankle plantarflexion      Ankle inversion      Ankle eversion       (Blank rows = not tested)  LOWER EXTREMITY MMT:    MMT Right Eval Left Eval Right 10/23/2022 Left 10/23/2022  Hip flexion 4 4 4+ 4+  Hip extension      Hip abduction      Hip adduction      Hip internal rotation      Hip external rotation      Knee flexion 4 3+ 4 4  Knee extension 4+ 4+ 4+ 4+  Ankle dorsiflexion 1 1 1 2   Ankle plantarflexion 3- 3-    Ankle inversion 3- 3-    Ankle eversion 3- 3-    (Blank rows = not tested)   TRANSFERS: Assistive device utilized: None  Sit to stand: Modified independence Stand to sit: Modified independence  GAIT: Gait pattern: step through pattern, decreased step length- Right, decreased step length-  Left, decreased ankle dorsiflexion- Right, decreased ankle dorsiflexion- Left, Right steppage, and Left steppage Distance walked: 50 ft x 2 Assistive device utilized: Single point cane Level of assistance: Modified independence  FUNCTIONAL TESTS:  Eval: 5 times sit to stand: 19.97 sec arms crossed at chest Timed up and go (TUG): 17.62 10 meter walk test: 13.25 sec cane (2.48 ft/sec) DGI :  12/24 SLS:  RLE 4 sec, LLE 1 sec  09/30/2022: 5 times sit to stand: 9.64 sec arms crossed at chest 6.11.24: 5x sit to stand: 10 seconds    [facility] PT Assessment - 10/23/22 0001       Standardized Balance Assessment   Standardized Balance Assessment Dynamic Gait Index      Dynamic Gait Index   Level Surface Mild Impairment    Change in Gait Speed Mild Impairment    Gait with Horizontal Head Turns Mild Impairment    Gait with Vertical Head Turns Mild Impairment    Gait and Pivot Turn Mild Impairment    Step Over Obstacle Mild Impairment    Step Around Obstacles Mild Impairment    Steps Normal    Total Score 17    DGI comment: Improved from 12/24      Functional Gait  Assessment   Gait assessed  Yes    Gait Level Surface Walks 20 ft in less than 7 sec but greater than 5.5 sec, uses assistive device, slower speed, mild gait deviations, or deviates 6-10 in outside of the 12 in walkway width.   6.29 sec   Change in Gait Speed Able to change speed, demonstrates mild gait deviations, deviates 6-10 in outside of the 12 in walkway width, or no gait deviations, unable to achieve a major change in velocity, or uses a change in velocity, or uses an assistive device.    Gait with Horizontal Head Turns Performs head turns smoothly with slight change in gait velocity (eg, minor disruption to smooth gait path), deviates 6-10 in outside 12 in walkway width, or uses an assistive device.    Gait with Vertical Head Turns Performs task with slight change in gait velocity (eg, minor disruption to smooth gait path),  deviates 6 - 10 in outside 12 in walkway width or uses assistive device    Gait and Pivot Turn Pivot turns safely in greater than 3 sec and stops with no loss of balance, or pivot turns safely within 3 sec and stops with mild imbalance, requires small steps to catch balance.    Step Over Obstacle Is able to step over one shoe box (4.5 in total height) but must slow down and adjust steps to clear box safely. May require verbal cueing.    Gait with Narrow Base of Support Ambulates less than 4 steps heel to toe or cannot perform without assistance.    Gait with Eyes Closed Walks 20 ft, slow speed, abnormal gait pattern, evidence for imbalance, deviates 10-15 in outside 12 in walkway width. Requires more than 9 sec to ambulate 20 ft.   12.18 sec   Ambulating Backwards Walks 20 ft, uses assistive device, slower speed, mild gait deviations, deviates 6-10 in outside 12 in walkway width.   14.94 sec   Steps Alternating feet, no rail.    Total Score 17    FGA comment: Scores <22/30 indicate increased fall risk            TODAY'S TREATMENT:  Date:  10/24/2022   [facility] PT Assessment - 10/23/22 0001       Standardized Balance Assessment   Standardized Balance Assessment Dynamic Gait Index      Dynamic Gait Index   Level Surface Mild Impairment    Change in Gait Speed Mild Impairment    Gait with Horizontal Head Turns Mild Impairment    Gait with Vertical Head Turns Mild Impairment    Gait and Pivot Turn Mild Impairment    Step Over Obstacle Mild Impairment  Step Around Obstacles Mild Impairment    Steps Normal    Total Score 17    DGI comment: Improved from 12/24      Functional Gait  Assessment   Gait assessed  Yes    Gait Level Surface Walks 20 ft in less than 7 sec but greater than 5.5 sec, uses assistive device, slower speed, mild gait deviations, or deviates 6-10 in outside of the 12 in walkway width.   6.29 sec   Change in Gait Speed Able to change speed, demonstrates mild gait  deviations, deviates 6-10 in outside of the 12 in walkway width, or no gait deviations, unable to achieve a major change in velocity, or uses a change in velocity, or uses an assistive device.    Gait with Horizontal Head Turns Performs head turns smoothly with slight change in gait velocity (eg, minor disruption to smooth gait path), deviates 6-10 in outside 12 in walkway width, or uses an assistive device.    Gait with Vertical Head Turns Performs task with slight change in gait velocity (eg, minor disruption to smooth gait path), deviates 6 - 10 in outside 12 in walkway width or uses assistive device    Gait and Pivot Turn Pivot turns safely in greater than 3 sec and stops with no loss of balance, or pivot turns safely within 3 sec and stops with mild imbalance, requires small steps to catch balance.    Step Over Obstacle Is able to step over one shoe box (4.5 in total height) but must slow down and adjust steps to clear box safely. May require verbal cueing.    Gait with Narrow Base of Support Ambulates less than 4 steps heel to toe or cannot perform without assistance.    Gait with Eyes Closed Walks 20 ft, slow speed, abnormal gait pattern, evidence for imbalance, deviates 10-15 in outside 12 in walkway width. Requires more than 9 sec to ambulate 20 ft.   12.18 sec   Ambulating Backwards Walks 20 ft, uses assistive device, slower speed, mild gait deviations, deviates 6-10 in outside 12 in walkway width.   14.94 sec   Steps Alternating feet, no rail.    Total Score 17    FGA comment: Scores <22/30 indicate increased fall risk            Gait velocity:  9.68 sec = 3.39 ft/sec Reviewed recent updates to HEP: Standing feet together EO and EC, then progressed EO head turns Standing on [employer]:  feet together EO and EC 30 seconds with increased sway to LLE with EC SLS:  RLE 6.2 sec, LLE 10sec Tandem stance:  30 sec RLE posterior; 30 sec LLE posterior  PATIENT EDUCATION: Education details: POC,  progress towards goals, benefits of continued therapy to continue to address low pain pain and weakness associated with lumbar findings Person educated: Patient Education method: Explanation Education comprehension: verbalized understanding      HOME EXERCISE PROGRAM: Access Code: [email or web address] URL: [email or web address] Date: 09/30/2022 Prepared by: Reather Laurence  Program Notes Seated heel digs:  Sit at edge of computer (rolling) chair:  dig your heels and pull forward/push back.  Do NOT use your back for pulling forward/pushing back.  Exercises - Sit to Stand  - 1 x daily - 5 x weekly - 5 reps - Seated Long Arc Quad  - 1 x daily - 7 x weekly - 2 sets - 10 reps - Seated Anterior Pelvic Tilt  - 1-2 x daily - 7 x weekly -  10 reps - Standing Lumbar Extension  - 1-2 x daily - 7 x weekly - 10 reps - Wall Quarter Squat  - 1 x daily - 7 x weekly - 10 reps - Standing Gastroc Stretch at Counter  - 1 x daily - 7 x weekly - 1 sets - 3 reps - 30 sec hold - Prone Knee Flexion  - 1 x daily - 7 x weekly - 2 sets - 10 reps - Prone Hip Extension  - 1 x daily - 7 x weekly - 2 sets - 10 reps - Supine Transversus Abdominis Bracing - Hands on Thighs  - 1 x daily - 7 x weekly - 2 sets - 10 reps   GOALS: Goals reviewed with patient? Yes  SHORT TERM GOALS: Target date: 10/10/2022  Pt will be independent with HEP for improved strength, balance, gait. Baseline: Goal status: MET  2.  Pt will report reduction in back pain by at least 50 % for improved functional mobility. Baseline: intermittent and 8/10 max (10/07/22) Goal status: IN PROGRESS  3.  Pt will improve 5x sit<>stand to less than or equal to 15 sec to demonstrate improved functional strength and transfer efficiency.  Baseline: 10 seconds  Goal status: MET on 09/30/2022  LONG TERM GOALS: Target date: 10/24/2022  Pt will be independent with HEP for improved strength, balance, pain, gait. Baseline:  Goal status: MET  2.  Pt will  improve DGI score to at least 17/24 to decrease fall risk. Baseline: 12/24>17/24 10/23/2022 Goal status: MET 10/23/2022  3.  Pt will improve 5x sit<>stand to less than or equal to 11.4 sec to demonstrate improved functional strength and transfer efficiency. Baseline: 10 seconds  Goal status: MET on 09/30/2022  4.  Pt will improve gait velocity to at least 2.62 ft/sec for improved gait efficiency and safety. Baseline: 2.48 ft/sec with cane>3.39 ft/sec no cane 10/23/2022 Goal status: GOAL MET, 10/23/2022  Updated GOALS per 10/23/22 RECERT:  SHORT TERM GOALS: Target date: 11/06/2022  Pt will be independent with HEP for improved strength, balance, decreased pain. Baseline: Goal status: INITIAL  LONG TERM GOALS: Target date:12/04/2022  Pt will be independent with final progression of HEP for improved strength, balance, transfers, and gait. Baseline:  Goal status: INITIAL  2.  Pt will improve DGI score to at least 20/24 to decrease fall risk.  Baseline:  17/24  Goal Status:  INITIAL  3.  Pt will improve L ankle strength to 3-/5 and R ankle strength to 2/5 for improved functional mobility. Baseline:  Goal status: INITIAL  4.  Pt will report improved pain in low back by 50% for improved functional mobility. Baseline: 6/10 10/23/2022 Goal status: INITIAL  5.  Pt will ambulate community distances and surfaces with AFOs and no device, independently for improved community mobility. Baseline: using cane; AFO consult 10/24/2022 Goal status: INITIAL   ASSESSMENT:  CLINICAL IMPRESSION: Assessed LTGs, with pt meeting 4 of 4 LTGs.  Assessed strength and flexibility, with pt demonstrating improved passive ankle dorsiflexion and some active L ankle dorsiflexion.  He is improving with lower extremity strength and balance scores.  He has been able to have traction as part of therapy session, x 2, at [facility], where he notes some relief of pain and some new movement in L foot.  He is  to go to have AFO consult on 10/24/22 to assist with improved functional mobility and gait due to drop foot bilaterally.  While he is improving with balance,  he continues to be at risk of falls per DGI score 17/24 (improved from 12/24) and FGA score of 17/30.  Patient continues to be motivated for improvement of strength and functional mobility; he will benefit from skilled PT to address the below impairments and improve overall function.    OBJECTIVE IMPAIRMENTS: Abnormal gait, decreased balance, decreased mobility, difficulty walking, decreased ROM, decreased strength, impaired flexibility, and impaired sensation.   ACTIVITY LIMITATIONS: carrying, lifting, bending, standing, stairs, transfers, and locomotion level  PARTICIPATION LIMITATIONS: driving, community activity, and occupation  PERSONAL FACTORS: 3+ comorbidities: see above; recent abdominal surgery 06/2022  are also affecting patient's functional outcome.   REHAB POTENTIAL: Good  CLINICAL DECISION MAKING: Evolving/moderate complexity  EVALUATION COMPLEXITY: Moderate  PLAN:  PT FREQUENCY: 2x/week  PT DURATION: 6 weeks  PLANNED INTERVENTIONS: Therapeutic exercises, Therapeutic activity, Neuromuscular re-education, Balance training, Gait training, Patient/Family education, Self Care, Joint mobilization, Dry Needling, Electrical stimulation, Traction, and Manual therapy  PLAN FOR NEXT SESSION: Recert completed to extend POC. Note updated goals.  Likely to continue at [facility] for traction.  Pt has appointment for AFO on 10/24/2022; Continue extension/McKenzie exercises for low back (L5-S1), dry needling/manual therapy if indicated. Balance and strength progression.     Lonia Blood, PT 10/23/22 12:59 PM Phone: [phone] Fax: [phone]  [facility] at [facility] [address] [place], [address] [place], [place] [address] Phone # [phone] Fax # [phone]

## 2022-10-25 NOTE — Progress Notes (Signed)
Internal Medicine Clinic Attending  Case discussed with the resident at the time of the visit.  We reviewed the resident's history and exam and pertinent patient test results.  I agree with the assessment, diagnosis, and plan of care documented in the resident's note.  

## 2022-10-30 ENCOUNTER — Ambulatory Visit: Payer: 59

## 2022-11-06 ENCOUNTER — Ambulatory Visit: Payer: 59

## 2022-11-06 DIAGNOSIS — M6281 Muscle weakness (generalized): Secondary | ICD-10-CM

## 2022-11-06 DIAGNOSIS — R2681 Unsteadiness on feet: Secondary | ICD-10-CM | POA: Diagnosis not present

## 2022-11-06 DIAGNOSIS — R2689 Other abnormalities of gait and mobility: Secondary | ICD-10-CM

## 2022-11-06 NOTE — Therapy (Addendum)
OUTPATIENT PHYSICAL THERAPY TREATMENT   Patient Name: Gabriel Dalton MRN: [id number] DOB:1985/12/16, 37 y.o., male Today's Date: 11/06/2022   PCP: Steffanie Rainwater, MD/to transition to Dr. Berkley Harvey PROVIDER: Gust Rung, DO   END OF SESSION:  PT End of Session - 11/06/22 1021     Visit Number 12    Date for PT Re-Evaluation 12/04/22    Authorization Type [employer]    Authorization - Number of Visits 50    PT Start Time 0937    PT Stop Time 1031    PT Time Calculation (min) 54 min    Activity Tolerance Patient tolerated treatment well    Behavior During Therapy [facility] for tasks assessed/performed                  Past Medical History:  Diagnosis Date   Abdominal fluid collection 07/11/2022   BPH (benign prostatic hyperplasia)    Bronchitis    Melena 04/25/2019   Multiple gastric ulcers    Peritonitis (HCC) 06/26/2022   Past Surgical History:  Procedure Laterality Date   CHOLECYSTECTOMY N/A 06/25/2022   Procedure: LAPAROSCOPIC CHOLECYSTECTOMY;  Surgeon: Quentin Ore, MD;  Location: [facility];  Service: General;  Laterality: N/A;   COLONOSCOPY     I & D EXTREMITY  10/18/2011   Procedure: IRRIGATION AND DEBRIDEMENT EXTREMITY;  Surgeon: Dominica Severin, MD;  Location: [facility];  Service: Orthopedics;  Laterality: Right;  Irrigation and Debridement  Right Middle Finger; Removal of foreign body   LAPAROTOMY N/A 06/27/2022   Procedure: EXPLORATORY LAPAROTOMY, WASH OUT,  ABDOMINAL CLOSURE;  Surgeon: Quentin Ore, MD;  Location: [facility];  Service: General;  Laterality: N/A;   Patient Active Problem List   Diagnosis Date Noted   Aortic calcification (HCC) 10/22/2022   Lumbar spondylosis with myelopathy 09/03/2022   Heart murmur 09/03/2022   Vitamin D deficiency 08/01/2022   Thiamine deficiency 08/01/2022   Electrolyte abnormality 07/09/2022   Benign prostatic hyperplasia 07/09/2022   Severe protein-calorie malnutrition (HCC) 06/25/2022    Small bowel perforation (HCC) 06/25/2022   Cholelithiasis 06/24/2022   Early satiety 05/27/2022   Nausea and vomiting 05/27/2022   Esophagitis determined by endoscopy 04/23/2022   Abnormal colonoscopy 12/26/2021   Gastroesophageal reflux disease 12/26/2021   Normocytic anemia 04/25/2019   Gastric ulcers 04/25/2019   Generalized abdominal pain 04/01/2019    ONSET DATE: 09/03/2022 (MD referral); abdominal surgery 06/2022  REFERRING DIAG:  M47.816 (ICD-10-CM) - Lumbar spondylosis  M47.16 (ICD-10-CM) - Lumbar spondylosis with myelopathy    THERAPY DIAG:  Unsteadiness on feet  Muscle weakness (generalized)  Other abnormalities of gait and mobility  Rationale for Evaluation and Treatment: Rehabilitation  SUBJECTIVE:  SUBJECTIVE STATEMENT: Lapse in treatment since 10/22/22. I want to do traction again.  I am walking without my cane.  I will get my AFOs 11/18/22.   Pt accompanied by: self  PERTINENT HISTORY:  Underwent exploratory laparotomy with small bowel resection with temporary abdominal closure on 06/25/22, and washout with abdominal closure on 06/27/22.  Patient goes to [facility] facility on 10/24/2022 to be fitted for AFO  PAIN:  Are you having pain? Yes: NPRS scale: 5/10 Pain location: low back Pain description: bloating, tight feeling Aggravating factors: eating Relieving factors: bowel movement   PRECAUTIONS: Other: Lifting restrictions  WEIGHT BEARING RESTRICTIONS: No  FALLS: Has patient fallen in last 6 months? Yes. Number of falls 1  LIVING ENVIRONMENT: Lives with: lives with their spouse Lives in: House/apartment Stairs: Yes: External: 6 steps; on right going up and on left going up Has following equipment at home: Single point cane  PLOF: Independent and Vocation/Vocational  requirements: truck driver  PATIENT GOALS: To get strength, balance, stability back  OBJECTIVE:   Below measures were taken at time of initial evaluation unless otherwise specified:   DIAGNOSTIC FINDINGS:  See MRI:  At L5-S1, there is mild disc degeneration. Small left subarticular disc protrusion (at site of posterior annular fissure). The disc protrusion results in slight left subarticular narrowing, and contacts the descending left S1 nerve root. No significant central canal stenosis.  COGNITION: Overall cognitive status: Within functional limits for tasks assessed   SENSATION: Light touch: Impaired   COORDINATION: Slightly slowed heel to shin  EDEMA:  Feet swell sometimes  POSTURE: No Significant postural limitations  LOWER EXTREMITY ROM:     Passive  Right Eval Left Eval Right 10/23/2022 Left 10/23/2022  Hip flexion      Hip extension      Hip abduction      Hip adduction      Hip internal rotation      Hip external rotation      Knee flexion      Knee extension      Ankle dorsiflexion 12 12 18 18   Ankle plantarflexion      Ankle inversion      Ankle eversion       (Blank rows = not tested)  LOWER EXTREMITY MMT:    MMT Right Eval Left Eval Right 10/23/2022 Left 10/23/2022  Hip flexion 4 4 4+ 4+  Hip extension      Hip abduction      Hip adduction      Hip internal rotation      Hip external rotation      Knee flexion 4 3+ 4 4  Knee extension 4+ 4+ 4+ 4+  Ankle dorsiflexion 1 1 1 2   Ankle plantarflexion 3- 3-    Ankle inversion 3- 3-    Ankle eversion 3- 3-    (Blank rows = not tested)   TRANSFERS: Assistive device utilized: None  Sit to stand: Modified independence Stand to sit: Modified independence  GAIT: Gait pattern: step through pattern, decreased step length- Right, decreased step length- Left, decreased ankle dorsiflexion- Right, decreased ankle dorsiflexion- Left, Right steppage, and Left steppage Distance walked: 50 ft x  2 Assistive device utilized: Single point cane Level of assistance: Modified independence  FUNCTIONAL TESTS:  Eval: 5 times sit to stand: 19.97 sec arms crossed at chest Timed up and go (TUG): 17.62 10 meter walk test: 13.25 sec cane (2.48 ft/sec) DGI :  12/24 SLS:  RLE 4 sec, LLE 1  sec  09/30/2022: 5 times sit to stand: 9.64 sec arms crossed at chest  6.11.24: 5x sit to stand: 10 seconds     TODAY'S TREATMENT: Date: 11/06/22:  Nustep L7 x5 minutes BLEs only Sit to stand holding 10# kettle bell 2x10 Standing on balance pad: hip abduction and exension bil  Sidestepping with green band around thighs: in // bars with min UE support  Lumbar Traction: Intermittent 65# max, 35# min, hold 60 sec, rest 15 sec. x15 minutes         Date:  10/24/2022  Gait velocity:  9.68 sec = 3.39 ft/sec Reviewed recent updates to HEP: Standing feet together EO and EC, then progressed EO head turns Standing on Airex:  feet together EO and EC 30 seconds with increased sway to LLE with EC SLS:  RLE 6.2 sec, LLE 10sec Tandem stance:  30 sec RLE posterior; 30 sec LLE posterior  PATIENT EDUCATION: Education details: POC, progress towards goals, benefits of continued therapy to continue to address low pain pain and weakness associated with lumbar findings Person educated: Patient Education method: Explanation Education comprehension: verbalized understanding   HOME EXERCISE PROGRAM: Access Code: [email or web address] URL: [email or web address] Date: 09/30/2022 Prepared by: Reather Laurence  Program Notes Seated heel digs:  Sit at edge of computer (rolling) chair:  dig your heels and pull forward/push back.  Do NOT use your back for pulling forward/pushing back.  Exercises - Sit to Stand  - 1 x daily - 5 x weekly - 5 reps - Seated Long Arc Quad  - 1 x daily - 7 x weekly - 2 sets - 10 reps - Seated Anterior Pelvic Tilt  - 1-2 x daily - 7 x weekly - 10 reps - Standing Lumbar Extension  - 1-2 x  daily - 7 x weekly - 10 reps - Wall Quarter Squat  - 1 x daily - 7 x weekly - 10 reps - Standing Gastroc Stretch at Counter  - 1 x daily - 7 x weekly - 1 sets - 3 reps - 30 sec hold - Prone Knee Flexion  - 1 x daily - 7 x weekly - 2 sets - 10 reps - Prone Hip Extension  - 1 x daily - 7 x weekly - 2 sets - 10 reps - Supine Transversus Abdominis Bracing - Hands on Thighs  - 1 x daily - 7 x weekly - 2 sets - 10 reps   GOALS: Goals reviewed with patient? Yes  SHORT TERM GOALS: Target date: 10/10/2022  Pt will be independent with HEP for improved strength, balance, gait. Baseline: Goal status: MET  2.  Pt will report reduction in back pain by at least 50 % for improved functional mobility. Baseline: intermittent and 8/10 max (10/07/22) Goal status: IN PROGRESS  3.  Pt will improve 5x sit<>stand to less than or equal to 15 sec to demonstrate improved functional strength and transfer efficiency.  Baseline: 10 seconds  Goal status: MET on 09/30/2022  LONG TERM GOALS: Target date: 10/24/2022  Pt will be independent with HEP for improved strength, balance, pain, gait. Baseline:  Goal status: MET  2.  Pt will improve DGI score to at least 17/24 to decrease fall risk. Baseline: 12/24>17/24 10/23/2022 Goal status: MET 10/23/2022  3.  Pt will improve 5x sit<>stand to less than or equal to 11.4 sec to demonstrate improved functional strength and transfer efficiency. Baseline: 10 seconds  Goal status: MET on 09/30/2022  4.  Pt will improve gait velocity to at  least 2.62 ft/sec for improved gait efficiency and safety. Baseline: 2.48 ft/sec with cane>3.39 ft/sec no cane 10/23/2022 Goal status: GOAL MET, 10/23/2022  Updated GOALS per 10/23/22 RECERT:  SHORT TERM GOALS: Target date: 11/06/2022  Pt will be independent with HEP for improved strength, balance, decreased pain. Baseline: Goal status: INITIAL  LONG TERM GOALS: Target date:12/04/2022  Pt will be independent with final progression of  HEP for improved strength, balance, transfers, and gait. Baseline:  Goal status: INITIAL  2.  Pt will improve DGI score to at least 20/24 to decrease fall risk.  Baseline:  17/24  Goal Status:  INITIAL  3.  Pt will improve L ankle strength to 3-/5 and R ankle strength to 2/5 for improved functional mobility. Baseline:  Goal status: INITIAL  4.  Pt will report improved pain in low back by 50% for improved functional mobility. Baseline: 6/10 10/23/2022 Goal status: INITIAL  5.  Pt will ambulate community distances and surfaces with AFOs and no device, independently for improved community mobility. Baseline: using cane; AFO consult 10/24/2022 Goal status: INITIAL   ASSESSMENT:  CLINICAL IMPRESSION: Pt is ambulating without cane and demonstrates some improved active DF today Lt>Rt.  He will get custom AFOs 11/18/22.  Pt is progressing consistently with strength and stability exercises.  PT present to provide verbal cueing and monitor for pain and safety throughout session.  Pt tolerated increased weight on traction well today. Patient continues to be motivated for improvement of strength and functional mobility; he will benefit from skilled PT to address the below impairments and improve overall function.    OBJECTIVE IMPAIRMENTS: Abnormal gait, decreased balance, decreased mobility, difficulty walking, decreased ROM, decreased strength, impaired flexibility, and impaired sensation.   ACTIVITY LIMITATIONS: carrying, lifting, bending, standing, stairs, transfers, and locomotion level  PARTICIPATION LIMITATIONS: driving, community activity, and occupation  PERSONAL FACTORS: 3+ comorbidities: see above; recent abdominal surgery 06/2022  are also affecting patient's functional outcome.   REHAB POTENTIAL: Good  CLINICAL DECISION MAKING: Evolving/moderate complexity  EVALUATION COMPLEXITY: Moderate  PLAN:  PT FREQUENCY: 2x/week  PT DURATION: 6 weeks  PLANNED INTERVENTIONS: Therapeutic  exercises, Therapeutic activity, Neuromuscular re-education, Balance training, Gait training, Patient/Family education, Self Care, Joint mobilization, Dry Needling, Electrical stimulation, Traction, and Manual therapy  PLAN FOR NEXT SESSION:  Pt has appointment for AFO on 11/18/2022;  dry needling/manual therapy if indicated. Balance and strength progression.     Lorrene Reid, PT 11/06/22 10:24 AM   [facility] [address] [place], [place] [address] Phone # [phone] Fax [phone]  Fax # [phone]

## 2022-11-10 ENCOUNTER — Encounter: Payer: Self-pay | Admitting: Rehabilitative and Restorative Service Providers"

## 2022-11-10 ENCOUNTER — Ambulatory Visit: Payer: 59 | Attending: Surgery | Admitting: Rehabilitative and Restorative Service Providers"

## 2022-11-10 DIAGNOSIS — M6281 Muscle weakness (generalized): Secondary | ICD-10-CM | POA: Insufficient documentation

## 2022-11-10 DIAGNOSIS — R262 Difficulty in walking, not elsewhere classified: Secondary | ICD-10-CM | POA: Insufficient documentation

## 2022-11-10 DIAGNOSIS — R2689 Other abnormalities of gait and mobility: Secondary | ICD-10-CM | POA: Diagnosis present

## 2022-11-10 DIAGNOSIS — R252 Cramp and spasm: Secondary | ICD-10-CM | POA: Diagnosis present

## 2022-11-10 DIAGNOSIS — R2681 Unsteadiness on feet: Secondary | ICD-10-CM | POA: Diagnosis present

## 2022-11-10 NOTE — Therapy (Signed)
OUTPATIENT PHYSICAL THERAPY TREATMENT   Patient Name: Gabriel Dalton MRN: [id number] DOB:10/31/1985, 37 y.o., male Today's Date: 11/10/2022   PCP: Steffanie Rainwater, MD/to transition to Dr. Berkley Harvey PROVIDER: Gust Rung, DO   END OF SESSION:  PT End of Session - 11/10/22 1153     Visit Number 13    Date for PT Re-Evaluation 12/04/22    Authorization Type [employer]    Authorization - Visit Number 12    Authorization - Number of Visits 50    PT Start Time 1150    PT Stop Time 1250    PT Time Calculation (min) 60 min    Activity Tolerance Patient tolerated treatment well    Behavior During Therapy [facility] for tasks assessed/performed                  Past Medical History:  Diagnosis Date   Abdominal fluid collection 07/11/2022   BPH (benign prostatic hyperplasia)    Bronchitis    Melena 04/25/2019   Multiple gastric ulcers    Peritonitis (HCC) 06/26/2022   Past Surgical History:  Procedure Laterality Date   CHOLECYSTECTOMY N/A 06/25/2022   Procedure: LAPAROSCOPIC CHOLECYSTECTOMY;  Surgeon: Quentin Ore, MD;  Location: [facility];  Service: General;  Laterality: N/A;   COLONOSCOPY     I & D EXTREMITY  10/18/2011   Procedure: IRRIGATION AND DEBRIDEMENT EXTREMITY;  Surgeon: Dominica Severin, MD;  Location: [facility];  Service: Orthopedics;  Laterality: Right;  Irrigation and Debridement  Right Middle Finger; Removal of foreign body   LAPAROTOMY N/A 06/27/2022   Procedure: EXPLORATORY LAPAROTOMY, WASH OUT,  ABDOMINAL CLOSURE;  Surgeon: Quentin Ore, MD;  Location: [facility];  Service: General;  Laterality: N/A;   Patient Active Problem List   Diagnosis Date Noted   Aortic calcification (HCC) 10/22/2022   Lumbar spondylosis with myelopathy 09/03/2022   Heart murmur 09/03/2022   Vitamin D deficiency 08/01/2022   Thiamine deficiency 08/01/2022   Electrolyte abnormality 07/09/2022   Benign prostatic hyperplasia 07/09/2022   Severe  protein-calorie malnutrition (HCC) 06/25/2022   Small bowel perforation (HCC) 06/25/2022   Cholelithiasis 06/24/2022   Early satiety 05/27/2022   Nausea and vomiting 05/27/2022   Esophagitis determined by endoscopy 04/23/2022   Abnormal colonoscopy 12/26/2021   Gastroesophageal reflux disease 12/26/2021   Normocytic anemia 04/25/2019   Gastric ulcers 04/25/2019   Generalized abdominal pain 04/01/2019    ONSET DATE: 09/03/2022 (MD referral); abdominal surgery 06/2022  REFERRING DIAG:  M47.816 (ICD-10-CM) - Lumbar spondylosis  M47.16 (ICD-10-CM) - Lumbar spondylosis with myelopathy    THERAPY DIAG:  Unsteadiness on feet  Muscle weakness (generalized)  Other abnormalities of gait and mobility  Rationale for Evaluation and Treatment: Rehabilitation  SUBJECTIVE:  SUBJECTIVE STATEMENT: Pt left cane in his car, states that he's trying to only use if for longer walking distances.  States that he feels better with lumbar traction.  Pt accompanied by: self  PERTINENT HISTORY:  Underwent exploratory laparotomy with small bowel resection with temporary abdominal closure on 06/25/22, and washout with abdominal closure on 06/27/22.  Patient goes to [facility] facility on 10/24/2022 to be fitted for AFO, returns to [facility] on 11/17/2022 to receive the AFO  PAIN:  Are you having pain? Yes: NPRS scale: 3-5/10 Pain location: low back Pain description: bloating, tight feeling Aggravating factors: eating Relieving factors: bowel movement   PRECAUTIONS: Other: Lifting restrictions  WEIGHT BEARING RESTRICTIONS: No  FALLS: Has patient fallen in last 6 months? Yes. Number of falls 1  LIVING ENVIRONMENT: Lives with: lives with their spouse Lives in: House/apartment Stairs: Yes: External: 6 steps; on right going  up and on left going up Has following equipment at home: Single point cane  PLOF: Independent and Vocation/Vocational requirements: truck driver  PATIENT GOALS: To get strength, balance, stability back  OBJECTIVE:   Below measures were taken at time of initial evaluation unless otherwise specified:   DIAGNOSTIC FINDINGS:  See MRI:  At L5-S1, there is mild disc degeneration. Small left subarticular disc protrusion (at site of posterior annular fissure). The disc protrusion results in slight left subarticular narrowing, and contacts the descending left S1 nerve root. No significant central canal stenosis.  COGNITION: Overall cognitive status: Within functional limits for tasks assessed   SENSATION: Light touch: Impaired   COORDINATION: Slightly slowed heel to shin  EDEMA:  Feet swell sometimes  POSTURE: No Significant postural limitations  LOWER EXTREMITY ROM:     Passive  Right Eval Left Eval Right 10/23/2022 Left 10/23/2022  Hip flexion      Hip extension      Hip abduction      Hip adduction      Hip internal rotation      Hip external rotation      Knee flexion      Knee extension      Ankle dorsiflexion 12 12 18 18   Ankle plantarflexion      Ankle inversion      Ankle eversion       (Blank rows = not tested)  LOWER EXTREMITY MMT:    MMT Right Eval Left Eval Right 10/23/2022 Left 10/23/2022  Hip flexion 4 4 4+ 4+  Hip extension      Hip abduction      Hip adduction      Hip internal rotation      Hip external rotation      Knee flexion 4 3+ 4 4  Knee extension 4+ 4+ 4+ 4+  Ankle dorsiflexion 1 1 1 2   Ankle plantarflexion 3- 3-    Ankle inversion 3- 3-    Ankle eversion 3- 3-    (Blank rows = not tested)   TRANSFERS: Assistive device utilized: None  Sit to stand: Modified independence Stand to sit: Modified independence  GAIT: Gait pattern: step through pattern, decreased step length- Right, decreased step length- Left, decreased ankle  dorsiflexion- Right, decreased ankle dorsiflexion- Left, Right steppage, and Left steppage Distance walked: 50 ft x 2 Assistive device utilized: Single point cane Level of assistance: Modified independence  FUNCTIONAL TESTS:  Eval: 5 times sit to stand: 19.97 sec arms crossed at chest Timed up and go (TUG): 17.62 10 meter walk test: 13.25 sec cane (2.48 ft/sec) DGI :  12/24 SLS:  RLE 4 sec, LLE 1 sec  09/30/2022: 5 times sit to stand: 9.64 sec arms crossed at chest  6.11.24:  5x sit to stand: 10 seconds     TODAY'S TREATMENT:  Date: 11/10/2022 Seated hamstring stretch 2x20 sec bilat with cuing for technique/posture Seated piriformis stretch 2x20 sec bilat Nustep level 6 x6 min with PT present to discuss status Sit to stand holding 10# kettle bell 3x10 Standing on balance pad: hip abduction and exension bil 2x10 each bilat Lumbar Traction: Intermittent 65# max, 35# min, hold 60 sec, rest 15 sec. x15 minutes    Date: 11/06/22:  Nustep L7 x5 minutes BLEs only Sit to stand holding 10# kettle bell 2x10 Standing on balance pad: hip abduction and exension bil  Sidestepping with green band around thighs: in // bars with min UE support  Lumbar Traction: Intermittent 65# max, 35# min, hold 60 sec, rest 15 sec. x15 minutes         Date:  10/24/2022  Gait velocity:  9.68 sec = 3.39 ft/sec Reviewed recent updates to HEP: Standing feet together EO and EC, then progressed EO head turns Standing on Airex:  feet together EO and EC 30 seconds with increased sway to LLE with EC SLS:  RLE 6.2 sec, LLE 10sec Tandem stance:  30 sec RLE posterior; 30 sec LLE posterior  PATIENT EDUCATION: Education details: POC, progress towards goals, benefits of continued therapy to continue to address low pain pain and weakness associated with lumbar findings Person educated: Patient Education method: Explanation Education comprehension: verbalized understanding   HOME EXERCISE PROGRAM: Access Code:  [email or web address] URL: [email or web address] Date: 09/30/2022 Prepared by: Reather Laurence  Program Notes Seated heel digs:  Sit at edge of computer (rolling) chair:  dig your heels and pull forward/push back.  Do NOT use your back for pulling forward/pushing back.  Exercises - Sit to Stand  - 1 x daily - 5 x weekly - 5 reps - Seated Long Arc Quad  - 1 x daily - 7 x weekly - 2 sets - 10 reps - Seated Anterior Pelvic Tilt  - 1-2 x daily - 7 x weekly - 10 reps - Standing Lumbar Extension  - 1-2 x daily - 7 x weekly - 10 reps - Wall Quarter Squat  - 1 x daily - 7 x weekly - 10 reps - Standing Gastroc Stretch at Counter  - 1 x daily - 7 x weekly - 1 sets - 3 reps - 30 sec hold - Prone Knee Flexion  - 1 x daily - 7 x weekly - 2 sets - 10 reps - Prone Hip Extension  - 1 x daily - 7 x weekly - 2 sets - 10 reps - Supine Transversus Abdominis Bracing - Hands on Thighs  - 1 x daily - 7 x weekly - 2 sets - 10 reps   GOALS: Goals reviewed with patient? Yes  SHORT TERM GOALS: Target date: 10/10/2022  Pt will be independent with HEP for improved strength, balance, gait. Baseline: Goal status: MET  2.  Pt will report reduction in back pain by at least 50 % for improved functional mobility. Baseline: intermittent and 8/10 max (10/07/22) Goal status: IN PROGRESS  3.  Pt will improve 5x sit<>stand to less than or equal to 15 sec to demonstrate improved functional strength and transfer efficiency.  Baseline: 10 seconds  Goal status: MET on 09/30/2022  LONG TERM GOALS: Target date: 10/24/2022  Pt will be independent with HEP  for improved strength, balance, pain, gait. Baseline:  Goal status: MET  2.  Pt will improve DGI score to at least 17/24 to decrease fall risk. Baseline: 12/24>17/24 10/23/2022 Goal status: MET 10/23/2022  3.  Pt will improve 5x sit<>stand to less than or equal to 11.4 sec to demonstrate improved functional strength and transfer efficiency. Baseline: 10 seconds  Goal  status: MET on 09/30/2022  4.  Pt will improve gait velocity to at least 2.62 ft/sec for improved gait efficiency and safety. Baseline: 2.48 ft/sec with cane>3.39 ft/sec no cane 10/23/2022 Goal status: GOAL MET, 10/23/2022  Updated GOALS per 10/23/22 RECERT:  SHORT TERM GOALS: Target date: 11/06/2022  Pt will be independent with HEP for improved strength, balance, decreased pain. Baseline: Goal status: MET  LONG TERM GOALS: Target date:12/04/2022  Pt will be independent with final progression of HEP for improved strength, balance, transfers, and gait. Baseline:  Goal status: INITIAL  2.  Pt will improve DGI score to at least 20/24 to decrease fall risk.  Baseline:  17/24  Goal Status:  INITIAL  3.  Pt will improve L ankle strength to 3-/5 and R ankle strength to 2/5 for improved functional mobility. Baseline:  Goal status: INITIAL  4.  Pt will report improved pain in low back by 50% for improved functional mobility. Baseline: 6/10 10/23/2022 Goal status: INITIAL  5.  Pt will ambulate community distances and surfaces with AFOs and no device, independently for improved community mobility. Baseline: using cane; AFO consult 10/24/2022 Goal status: INITIAL   ASSESSMENT:  CLINICAL IMPRESSION: Pt is ambulating without cane and demonstrates some improved active DF today Lt>Rt.  He will get custom AFOs 11/18/22.  Pt is progressing consistently with strength and stability exercises.  Pt continues to tolerate traction well.  Patient continues to demonstrate compensatory mechanisms such as knee valgus and genu recurvatum due to strength loss from hip extensors and abductors likely resulting in the observed instability during sit to stands w/10lb kettle ball. However, patient responded well to verbal and tactile cuing to sustain proper form. Patient continues to be motivated for improvement of strength and functional mobility; he will benefit from skilled PT to address the below impairments and  improve overall function.    OBJECTIVE IMPAIRMENTS: Abnormal gait, decreased balance, decreased mobility, difficulty walking, decreased ROM, decreased strength, impaired flexibility, and impaired sensation.   ACTIVITY LIMITATIONS: carrying, lifting, bending, standing, stairs, transfers, and locomotion level  PARTICIPATION LIMITATIONS: driving, community activity, and occupation  PERSONAL FACTORS: 3+ comorbidities: see above; recent abdominal surgery 06/2022  are also affecting patient's functional outcome.   REHAB POTENTIAL: Good  CLINICAL DECISION MAKING: Evolving/moderate complexity  EVALUATION COMPLEXITY: Moderate  PLAN:  PT FREQUENCY: 2x/week  PT DURATION: 6 weeks  PLANNED INTERVENTIONS: Therapeutic exercises, Therapeutic activity, Neuromuscular re-education, Balance training, Gait training, Patient/Family education, Self Care, Joint mobilization, Dry Needling, Electrical stimulation, Traction, and Manual therapy  PLAN FOR NEXT SESSION:  Pt has appointment for AFO on 11/18/2022;  dry needling/manual therapy if indicated. Balance and strength progression.     Ailene Ards, SPT Reather Laurence, PT, DPT 11/10/22, 1:04 PM   [facility] [address], [address] [place], [place] [address] Phone # [phone] Fax [phone]  Fax # [phone]

## 2022-11-11 NOTE — Telephone Encounter (Signed)
Pt was seen 10/22/22.

## 2022-11-20 ENCOUNTER — Ambulatory Visit: Payer: 59

## 2022-11-20 DIAGNOSIS — R2681 Unsteadiness on feet: Secondary | ICD-10-CM | POA: Diagnosis not present

## 2022-11-20 DIAGNOSIS — M6281 Muscle weakness (generalized): Secondary | ICD-10-CM

## 2022-11-20 DIAGNOSIS — R252 Cramp and spasm: Secondary | ICD-10-CM

## 2022-11-20 DIAGNOSIS — R262 Difficulty in walking, not elsewhere classified: Secondary | ICD-10-CM

## 2022-11-20 NOTE — Therapy (Signed)
OUTPATIENT PHYSICAL THERAPY TREATMENT   Patient Name: Gabriel Dalton MRN: [id number] DOB:1986-01-21, 37 y.o., male Today's Date: 11/20/2022   PCP: Steffanie Rainwater, MD/to transition to Dr. Berkley Harvey PROVIDER: Gust Rung, DO   END OF SESSION:  PT End of Session - 11/20/22 0938     Visit Number 14    Number of Visits 23    Date for PT Re-Evaluation 12/04/22    Authorization Type [employer]    Authorization - Number of Visits 50    PT Start Time 0935    PT Stop Time 1021    PT Time Calculation (min) 46 min    Activity Tolerance Patient tolerated treatment well    Behavior During Therapy [facility] for tasks assessed/performed                  Past Medical History:  Diagnosis Date   Abdominal fluid collection 07/11/2022   BPH (benign prostatic hyperplasia)    Bronchitis    Melena 04/25/2019   Multiple gastric ulcers    Peritonitis (HCC) 06/26/2022   Past Surgical History:  Procedure Laterality Date   CHOLECYSTECTOMY N/A 06/25/2022   Procedure: LAPAROSCOPIC CHOLECYSTECTOMY;  Surgeon: Quentin Ore, MD;  Location: [facility];  Service: General;  Laterality: N/A;   COLONOSCOPY     I & D EXTREMITY  10/18/2011   Procedure: IRRIGATION AND DEBRIDEMENT EXTREMITY;  Surgeon: Dominica Severin, MD;  Location: [facility];  Service: Orthopedics;  Laterality: Right;  Irrigation and Debridement  Right Middle Finger; Removal of foreign body   LAPAROTOMY N/A 06/27/2022   Procedure: EXPLORATORY LAPAROTOMY, WASH OUT,  ABDOMINAL CLOSURE;  Surgeon: Quentin Ore, MD;  Location: [facility];  Service: General;  Laterality: N/A;   Patient Active Problem List   Diagnosis Date Noted   Aortic calcification (HCC) 10/22/2022   Lumbar spondylosis with myelopathy 09/03/2022   Heart murmur 09/03/2022   Vitamin D deficiency 08/01/2022   Thiamine deficiency 08/01/2022   Electrolyte abnormality 07/09/2022   Benign prostatic hyperplasia 07/09/2022   Severe protein-calorie  malnutrition (HCC) 06/25/2022   Small bowel perforation (HCC) 06/25/2022   Cholelithiasis 06/24/2022   Early satiety 05/27/2022   Nausea and vomiting 05/27/2022   Esophagitis determined by endoscopy 04/23/2022   Abnormal colonoscopy 12/26/2021   Gastroesophageal reflux disease 12/26/2021   Normocytic anemia 04/25/2019   Gastric ulcers 04/25/2019   Generalized abdominal pain 04/01/2019    ONSET DATE: 09/03/2022 (MD referral); abdominal surgery 06/2022  REFERRING DIAG:  M47.816 (ICD-10-CM) - Lumbar spondylosis  M47.16 (ICD-10-CM) - Lumbar spondylosis with myelopathy    THERAPY DIAG:  Unsteadiness on feet  Muscle weakness (generalized)  Cramp and spasm  Difficulty in walking, not elsewhere classified  Rationale for Evaluation and Treatment: Rehabilitation  SUBJECTIVE:  SUBJECTIVE STATEMENT: Patient arrives wearing AFO's.  He reports feeling like he can walk much better without looking at the ground all the time.  "But it makes my feet really sore, so I can't wear them long".  Patient states he is feeling like he is seeing some improved activity in left ankle DF but not much change in the right.    Pt accompanied by: self  PERTINENT HISTORY:  Underwent exploratory laparotomy with small bowel resection with temporary abdominal closure on 06/25/22, and washout with abdominal closure on 06/27/22.  Patient goes to [facility] facility on 10/24/2022 to be fitted for AFO  PAIN:  Are you having pain? Yes: NPRS scale: 5/10 Pain location: low back Pain description: bloating, tight feeling Aggravating factors: eating Relieving factors: bowel movement   PRECAUTIONS: Other: Lifting restrictions  WEIGHT BEARING RESTRICTIONS: No  FALLS: Has patient fallen in last 6 months? Yes. Number of falls 1  LIVING  ENVIRONMENT: Lives with: lives with their spouse Lives in: House/apartment Stairs: Yes: External: 6 steps; on right going up and on left going up Has following equipment at home: Single point cane  PLOF: Independent and Vocation/Vocational requirements: truck driver  PATIENT GOALS: To get strength, balance, stability back  OBJECTIVE:   Below measures were taken at time of initial evaluation unless otherwise specified:   DIAGNOSTIC FINDINGS:  See MRI:  At L5-S1, there is mild disc degeneration. Small left subarticular disc protrusion (at site of posterior annular fissure). The disc protrusion results in slight left subarticular narrowing, and contacts the descending left S1 nerve root. No significant central canal stenosis.  COGNITION: Overall cognitive status: Within functional limits for tasks assessed   SENSATION: Light touch: Impaired   COORDINATION: Slightly slowed heel to shin  EDEMA:  Feet swell sometimes  POSTURE: No Significant postural limitations  LOWER EXTREMITY ROM:     Passive  Right Eval Left Eval Right 10/23/2022 Left 10/23/2022  Hip flexion      Hip extension      Hip abduction      Hip adduction      Hip internal rotation      Hip external rotation      Knee flexion      Knee extension      Ankle dorsiflexion 12 12 18 18   Ankle plantarflexion      Ankle inversion      Ankle eversion       (Blank rows = not tested)  LOWER EXTREMITY MMT:    MMT Right Eval Left Eval Right 10/23/2022 Left 10/23/2022  Hip flexion 4 4 4+ 4+  Hip extension      Hip abduction      Hip adduction      Hip internal rotation      Hip external rotation      Knee flexion 4 3+ 4 4  Knee extension 4+ 4+ 4+ 4+  Ankle dorsiflexion 1 1 1 2   Ankle plantarflexion 3- 3-    Ankle inversion 3- 3-    Ankle eversion 3- 3-    (Blank rows = not tested)   TRANSFERS: Assistive device utilized: None  Sit to stand: Modified independence Stand to sit: Modified  independence  GAIT: Gait pattern: step through pattern, decreased step length- Right, decreased step length- Left, decreased ankle dorsiflexion- Right, decreased ankle dorsiflexion- Left, Right steppage, and Left steppage Distance walked: 50 ft x 2 Assistive device utilized: Single point cane Level of assistance: Modified independence  FUNCTIONAL TESTS:  Eval: 5  times sit to stand: 19.97 sec arms crossed at chest Timed up and go (TUG): 17.62 10 meter walk test: 13.25 sec cane (2.48 ft/sec) DGI :  12/24 SLS:  RLE 4 sec, LLE 1 sec  09/30/2022: 5 times sit to stand: 9.64 sec arms crossed at chest  6.11.24: 5x sit to stand: 10 seconds     TODAY'S TREATMENT: Date: 11/20/22:  Nustep L7 x5 minutes BLEs only Sit to stand holding 10# kettle bell 2x10 Standing on balance pad: hip abduction and exension bil 2 x 10 each Lumbar Traction: Intermittent 70# max, 35# min, hold 60 sec, rest 15 sec. x15 minutes   Date: 11/06/22:  Nustep L7 x5 minutes BLEs only Sit to stand holding 10# kettle bell 2x10 Standing on balance pad: hip abduction and exension bil  Sidestepping with green band around thighs: in // bars with min UE support  Lumbar Traction: Intermittent 65# max, 35# min, hold 60 sec, rest 15 sec. x15 minutes         Date:  10/24/2022  Gait velocity:  9.68 sec = 3.39 ft/sec Reviewed recent updates to HEP: Standing feet together EO and EC, then progressed EO head turns Standing on Airex:  feet together EO and EC 30 seconds with increased sway to LLE with EC SLS:  RLE 6.2 sec, LLE 10sec Tandem stance:  30 sec RLE posterior; 30 sec LLE posterior  PATIENT EDUCATION: Education details: POC, progress towards goals, benefits of continued therapy to continue to address low pain pain and weakness associated with lumbar findings Person educated: Patient Education method: Explanation Education comprehension: verbalized understanding   HOME EXERCISE PROGRAM: Access Code: [email or web address] URL:  [email or web address] Date: 09/30/2022 Prepared by: Reather Laurence  Program Notes Seated heel digs:  Sit at edge of computer (rolling) chair:  dig your heels and pull forward/push back.  Do NOT use your back for pulling forward/pushing back.  Exercises - Sit to Stand  - 1 x daily - 5 x weekly - 5 reps - Seated Long Arc Quad  - 1 x daily - 7 x weekly - 2 sets - 10 reps - Seated Anterior Pelvic Tilt  - 1-2 x daily - 7 x weekly - 10 reps - Standing Lumbar Extension  - 1-2 x daily - 7 x weekly - 10 reps - Wall Quarter Squat  - 1 x daily - 7 x weekly - 10 reps - Standing Gastroc Stretch at Counter  - 1 x daily - 7 x weekly - 1 sets - 3 reps - 30 sec hold - Prone Knee Flexion  - 1 x daily - 7 x weekly - 2 sets - 10 reps - Prone Hip Extension  - 1 x daily - 7 x weekly - 2 sets - 10 reps - Supine Transversus Abdominis Bracing - Hands on Thighs  - 1 x daily - 7 x weekly - 2 sets - 10 reps   GOALS: Goals reviewed with patient? Yes  SHORT TERM GOALS: Target date: 10/10/2022  Pt will be independent with HEP for improved strength, balance, gait. Baseline: Goal status: MET  2.  Pt will report reduction in back pain by at least 50 % for improved functional mobility. Baseline: intermittent and 8/10 max (10/07/22) Goal status: IN PROGRESS  3.  Pt will improve 5x sit<>stand to less than or equal to 15 sec to demonstrate improved functional strength and transfer efficiency.  Baseline: 10 seconds  Goal status: MET on 09/30/2022  LONG TERM GOALS: Target date: 10/24/2022  Pt will be independent with HEP for improved strength, balance, pain, gait. Baseline:  Goal status: MET  2.  Pt will improve DGI score to at least 17/24 to decrease fall risk. Baseline: 12/24>17/24 10/23/2022 Goal status: MET 10/23/2022  3.  Pt will improve 5x sit<>stand to less than or equal to 11.4 sec to demonstrate improved functional strength and transfer efficiency. Baseline: 10 seconds  Goal status: MET on  09/30/2022  4.  Pt will improve gait velocity to at least 2.62 ft/sec for improved gait efficiency and safety. Baseline: 2.48 ft/sec with cane>3.39 ft/sec no cane 10/23/2022 Goal status: GOAL MET, 10/23/2022  Updated GOALS per 10/23/22 RECERT:  SHORT TERM GOALS: Target date: 11/06/2022  Pt will be independent with HEP for improved strength, balance, decreased pain. Baseline: Goal status: INITIAL  LONG TERM GOALS: Target date:12/04/2022  Pt will be independent with final progression of HEP for improved strength, balance, transfers, and gait. Baseline:  Goal status: INITIAL  2.  Pt will improve DGI score to at least 20/24 to decrease fall risk.  Baseline:  17/24  Goal Status:  INITIAL  3.  Pt will improve L ankle strength to 3-/5 and R ankle strength to 2/5 for improved functional mobility. Baseline:  Goal status: INITIAL  4.  Pt will report improved pain in low back by 50% for improved functional mobility. Baseline: 6/10 10/23/2022 Goal status: INITIAL  5.  Pt will ambulate community distances and surfaces with AFOs and no device, independently for improved community mobility. Baseline: using cane; AFO consult 10/24/2022 Goal status: INITIAL   ASSESSMENT:  CLINICAL IMPRESSION: Pt is ambulating without cane and new AFO's today.  He demonstrates much improved and safer gait pattern.  He is not staring at the floor and is able to keep more upright posture.  He admits that the AFO's are making him sore.  We discussed wearing time and on/off schedule to allow him to acclimate to the new foot position. He demonstrated some increased activity in the left DF's today.   Pt tolerated increased weight on traction again today. Patient continues to be motivated for improvement of strength and functional mobility; he will benefit from skilled PT to address the below impairments and improve overall function.    OBJECTIVE IMPAIRMENTS: Abnormal gait, decreased balance, decreased mobility, difficulty  walking, decreased ROM, decreased strength, impaired flexibility, and impaired sensation.   ACTIVITY LIMITATIONS: carrying, lifting, bending, standing, stairs, transfers, and locomotion level  PARTICIPATION LIMITATIONS: driving, community activity, and occupation  PERSONAL FACTORS: 3+ comorbidities: see above; recent abdominal surgery 06/2022  are also affecting patient's functional outcome.   REHAB POTENTIAL: Good  CLINICAL DECISION MAKING: Evolving/moderate complexity  EVALUATION COMPLEXITY: Moderate  PLAN:  PT FREQUENCY: 2x/week  PT DURATION: 6 weeks  PLANNED INTERVENTIONS: Therapeutic exercises, Therapeutic activity, Neuromuscular re-education, Balance training, Gait training, Patient/Family education, Self Care, Joint mobilization, Dry Needling, Electrical stimulation, Traction, and Manual therapy  PLAN FOR NEXT SESSION:  Dry needling/manual therapy if indicated. Traction. Balance and strength progression.     Victorino Dike B. Persais Ethridge, PT 11/20/22 11:29 AM  [facility] [address] [place], [place] [address] Phone # [phone] Fax [phone]  Fax # [phone]

## 2022-11-25 ENCOUNTER — Ambulatory Visit: Payer: 59

## 2022-11-25 DIAGNOSIS — R2689 Other abnormalities of gait and mobility: Secondary | ICD-10-CM

## 2022-11-25 DIAGNOSIS — R262 Difficulty in walking, not elsewhere classified: Secondary | ICD-10-CM

## 2022-11-25 DIAGNOSIS — R2681 Unsteadiness on feet: Secondary | ICD-10-CM

## 2022-11-25 DIAGNOSIS — R252 Cramp and spasm: Secondary | ICD-10-CM

## 2022-11-25 DIAGNOSIS — M6281 Muscle weakness (generalized): Secondary | ICD-10-CM

## 2022-11-25 NOTE — Therapy (Signed)
OUTPATIENT PHYSICAL THERAPY TREATMENT   Patient Name: Gabriel Dalton MRN: [id number] DOB:13-Oct-1985, 37 y.o., male Today's Date: 11/25/2022   PCP: Steffanie Rainwater, MD/to transition to Dr. Berkley Harvey PROVIDER: Gust Rung, DO   END OF SESSION:  PT End of Session - 11/25/22 0927     Visit Number 15    Date for PT Re-Evaluation 12/04/22    Authorization Type [employer]    Authorization - Number of Visits 50    PT Start Time 0847    PT Stop Time 0942    PT Time Calculation (min) 55 min    Activity Tolerance Patient tolerated treatment well    Behavior During Therapy [facility] for tasks assessed/performed                   Past Medical History:  Diagnosis Date   Abdominal fluid collection 07/11/2022   BPH (benign prostatic hyperplasia)    Bronchitis    Melena 04/25/2019   Multiple gastric ulcers    Peritonitis (HCC) 06/26/2022   Past Surgical History:  Procedure Laterality Date   CHOLECYSTECTOMY N/A 06/25/2022   Procedure: LAPAROSCOPIC CHOLECYSTECTOMY;  Surgeon: Quentin Ore, MD;  Location: [facility];  Service: General;  Laterality: N/A;   COLONOSCOPY     I & D EXTREMITY  10/18/2011   Procedure: IRRIGATION AND DEBRIDEMENT EXTREMITY;  Surgeon: Dominica Severin, MD;  Location: [facility];  Service: Orthopedics;  Laterality: Right;  Irrigation and Debridement  Right Middle Finger; Removal of foreign body   LAPAROTOMY N/A 06/27/2022   Procedure: EXPLORATORY LAPAROTOMY, WASH OUT,  ABDOMINAL CLOSURE;  Surgeon: Quentin Ore, MD;  Location: [facility];  Service: General;  Laterality: N/A;   Patient Active Problem List   Diagnosis Date Noted   Aortic calcification (HCC) 10/22/2022   Lumbar spondylosis with myelopathy 09/03/2022   Heart murmur 09/03/2022   Vitamin D deficiency 08/01/2022   Thiamine deficiency 08/01/2022   Electrolyte abnormality 07/09/2022   Benign prostatic hyperplasia 07/09/2022   Severe protein-calorie malnutrition (HCC) 06/25/2022    Small bowel perforation (HCC) 06/25/2022   Cholelithiasis 06/24/2022   Early satiety 05/27/2022   Nausea and vomiting 05/27/2022   Esophagitis determined by endoscopy 04/23/2022   Abnormal colonoscopy 12/26/2021   Gastroesophageal reflux disease 12/26/2021   Normocytic anemia 04/25/2019   Gastric ulcers 04/25/2019   Generalized abdominal pain 04/01/2019    ONSET DATE: 09/03/2022 (MD referral); abdominal surgery 06/2022  REFERRING DIAG:  M47.816 (ICD-10-CM) - Lumbar spondylosis  M47.16 (ICD-10-CM) - Lumbar spondylosis with myelopathy    THERAPY DIAG:  Unsteadiness on feet  Muscle weakness (generalized)  Cramp and spasm  Difficulty in walking, not elsewhere classified  Other abnormalities of gait and mobility  Rationale for Evaluation and Treatment: Rehabilitation  SUBJECTIVE:  SUBJECTIVE STATEMENT: My heels feel sore when I wear the AFOs.  When I take off the AFOs, I have a harder time lifting my toes.  This is really helping me.   Pt accompanied by: self  PERTINENT HISTORY:  Underwent exploratory laparotomy with small bowel resection with temporary abdominal closure on 06/25/22, and washout with abdominal closure on 06/27/22.  Patient goes to [facility] facility on 10/24/2022 to be fitted for AFO  PAIN:  Are you having pain? Yes: NPRS scale: 5/10 Pain location: low back Pain description: bloating, tight feeling Aggravating factors: eating Relieving factors: bowel movement   PRECAUTIONS: Other: Lifting restrictions  WEIGHT BEARING RESTRICTIONS: No  FALLS: Has patient fallen in last 6 months? Yes. Number of falls 1  LIVING ENVIRONMENT: Lives with: lives with their spouse Lives in: House/apartment Stairs: Yes: External: 6 steps; on right going up and on left going up Has following  equipment at home: Single point cane  PLOF: Independent and Vocation/Vocational requirements: truck driver  PATIENT GOALS: To get strength, balance, stability back  OBJECTIVE:   Below measures were taken at time of initial evaluation unless otherwise specified:   DIAGNOSTIC FINDINGS:  See MRI:  At L5-S1, there is mild disc degeneration. Small left subarticular disc protrusion (at site of posterior annular fissure). The disc protrusion results in slight left subarticular narrowing, and contacts the descending left S1 nerve root. No significant central canal stenosis.  COGNITION: Overall cognitive status: Within functional limits for tasks assessed   SENSATION: Light touch: Impaired   COORDINATION: Slightly slowed heel to shin  EDEMA:  Feet swell sometimes  POSTURE: No Significant postural limitations  LOWER EXTREMITY ROM:     Passive  Right Eval Left Eval Right 10/23/2022 Left 10/23/2022  Hip flexion      Hip extension      Hip abduction      Hip adduction      Hip internal rotation      Hip external rotation      Knee flexion      Knee extension      Ankle dorsiflexion 12 12 18 18   Ankle plantarflexion      Ankle inversion      Ankle eversion       (Blank rows = not tested)  LOWER EXTREMITY MMT:    MMT Right Eval Left Eval Right 10/23/2022 Left 10/23/2022  Hip flexion 4 4 4+ 4+  Hip extension      Hip abduction      Hip adduction      Hip internal rotation      Hip external rotation      Knee flexion 4 3+ 4 4  Knee extension 4+ 4+ 4+ 4+  Ankle dorsiflexion 1 1 1 2   Ankle plantarflexion 3- 3-    Ankle inversion 3- 3-    Ankle eversion 3- 3-    (Blank rows = not tested)   TRANSFERS: Assistive device utilized: None  Sit to stand: Modified independence Stand to sit: Modified independence  GAIT: Gait pattern: step through pattern, decreased step length- Right, decreased step length- Left, decreased ankle dorsiflexion- Right, decreased ankle  dorsiflexion- Left, Right steppage, and Left steppage Distance walked: 50 ft x 2 Assistive device utilized: Single point cane Level of assistance: Modified independence  FUNCTIONAL TESTS:  Eval: 5 times sit to stand: 19.97 sec arms crossed at chest Timed up and go (TUG): 17.62 10 meter walk test: 13.25 sec cane (2.48 ft/sec) DGI :  12/24 SLS:  RLE 4 sec, LLE 1 sec  09/30/2022: 5 times sit to stand: 9.64 sec arms crossed at chest  6.11.24: 5x sit to stand: 10 seconds     TODAY'S TREATMENT: Date: 11/25/22:  Nustep L7 x5 minutes BLEs only Sit to stand holding 10# kettle bell 2x10 Standing on balance pad: hip abduction and exension bil 2 x 10 each Stepping over hurdles forward and lateral with min UE support Step up on Bosu Ball Rt and Lt with min to no UE support  Pallof press: Red band 2x10 bil each  Lumbar Traction: Intermittent 70# max, 35# min, hold 60 sec, rest 15 sec. x15 minutes   Date: 11/20/22:  Nustep L7 x5 minutes BLEs only Sit to stand holding 10# kettle bell 2x10 Standing on balance pad: hip abduction and exension bil 2 x 10 each Lumbar Traction: Intermittent 70# max, 35# min, hold 60 sec, rest 15 sec. x15 minutes    PATIENT EDUCATION: Education details: POC, progress towards goals, benefits of continued therapy to continue to address low pain pain and weakness associated with lumbar findings Person educated: Patient Education method: Explanation Education comprehension: verbalized understanding   HOME EXERCISE PROGRAM: Access Code: [email or web address] URL: [email or web address] Date: 11/25/2022 Prepared by: Tresa Endo  Program Notes Seated heel digs:  Sit at edge of computer (rolling) chair:  dig your heels and pull forward/push back.  Do NOT use your back for pulling forward/pushing back.  Exercises - Sit to Stand  - 1 x daily - 5 x weekly - 5 reps - Seated Long Arc Quad  - 1 x daily - 7 x weekly - 2 sets - 10 reps - Seated Anterior Pelvic Tilt  - 1-2 x  daily - 7 x weekly - 10 reps - Standing Lumbar Extension  - 1-2 x daily - 7 x weekly - 10 reps - Wall Quarter Squat  - 1 x daily - 7 x weekly - 10 reps - Standing Gastroc Stretch at Counter  - 1 x daily - 7 x weekly - 1 sets - 3 reps - 30 sec hold - Prone Knee Flexion  - 1 x daily - 7 x weekly - 2 sets - 10 reps - Prone Hip Extension  - 1 x daily - 7 x weekly - 2 sets - 10 reps - Supine Transversus Abdominis Bracing - Hands on Thighs  - 1 x daily - 7 x weekly - 2 sets - 10 reps - Seated Calf Stretch with Strap  - 1 x daily - 5 x weekly - 2 sets - 5 reps - Romberg Stance with Eyes Closed  - 1 x daily - 5 x weekly - 2 sets - 30 sec hold - Narrow Stance with Counter Support  - 1 x daily - 5 x weekly - 2 sets - 30 sec hold - Standing Anti-Rotation Press with Anchored Resistance  - 2 x daily - 7 x weekly - 2 sets - 10 reps   GOALS: Goals reviewed with patient? Yes  SHORT TERM GOALS: Target date: 10/10/2022  Pt will be independent with HEP for improved strength, balance, gait. Baseline: Goal status: MET  2.  Pt will report reduction in back pain by at least 50 % for improved functional mobility. Baseline: intermittent and 8/10 max (10/07/22) Goal status: IN PROGRESS  3.  Pt will improve 5x sit<>stand to less than or equal to 15 sec to demonstrate improved functional strength and transfer efficiency.  Baseline: 10 seconds  Goal status: MET on 09/30/2022  LONG TERM GOALS: Target date: 10/24/2022  Pt will be independent with HEP for improved strength, balance, pain, gait. Baseline:  Goal status: MET  2.  Pt will improve DGI score to at least 17/24 to decrease fall risk. Baseline: 12/24>17/24 10/23/2022 Goal status: MET 10/23/2022  3.  Pt will improve 5x sit<>stand to less than or equal to 11.4 sec to demonstrate improved functional strength and transfer efficiency. Baseline: 10 seconds  Goal status: MET on 09/30/2022  4.  Pt will improve gait velocity to at least 2.62 ft/sec for improved  gait efficiency and safety. Baseline: 2.48 ft/sec with cane>3.39 ft/sec no cane 10/23/2022 Goal status: GOAL MET, 10/23/2022  Updated GOALS per 10/23/22 RECERT:  SHORT TERM GOALS: Target date: 11/06/2022  Pt will be independent with HEP for improved strength, balance, decreased pain. Baseline: Goal status: INITIAL  LONG TERM GOALS: Target date:12/04/2022  Pt will be independent with final progression of HEP for improved strength, balance, transfers, and gait. Baseline:  Goal status: INITIAL  2.  Pt will improve DGI score to at least 20/24 to decrease fall risk.  Baseline:  17/24  Goal Status:  INITIAL  3.  Pt will improve L ankle strength to 3-/5 and R ankle strength to 2/5 for improved functional mobility. Baseline:  Goal status: INITIAL  4.  Pt will report improved pain in low back by 50% for improved functional mobility. Baseline: 6/10 10/23/2022 Goal status: INITIAL  5.  Pt will ambulate community distances and surfaces with AFOs and no device, independently for improved community mobility. Baseline: using cane; AFO consult 10/24/2022 Goal status: INITIAL   ASSESSMENT:  CLINICAL IMPRESSION: Pt continues to adjust to new AFOs.  He is challenged with coordinating hip and knee motion and stability now that ankles are fixed with braces.  Pt did well making adjustments with increased reps and had good LE stability with balance work today.   PT added Pallof press to address core strength.  Patient continues to be motivated for improvement of strength and functional mobility; he will benefit from skilled PT to address the below impairments and improve overall function.    OBJECTIVE IMPAIRMENTS: Abnormal gait, decreased balance, decreased mobility, difficulty walking, decreased ROM, decreased strength, impaired flexibility, and impaired sensation.   ACTIVITY LIMITATIONS: carrying, lifting, bending, standing, stairs, transfers, and locomotion level  PARTICIPATION LIMITATIONS:  driving, community activity, and occupation  PERSONAL FACTORS: 3+ comorbidities: see above; recent abdominal surgery 06/2022  are also affecting patient's functional outcome.   REHAB POTENTIAL: Good  CLINICAL DECISION MAKING: Evolving/moderate complexity  EVALUATION COMPLEXITY: Moderate  PLAN:  PT FREQUENCY: 2x/week  PT DURATION: 6 weeks  PLANNED INTERVENTIONS: Therapeutic exercises, Therapeutic activity, Neuromuscular re-education, Balance training, Gait training, Patient/Family education, Self Care, Joint mobilization, Dry Needling, Electrical stimulation, Traction, and Manual therapy  PLAN FOR NEXT SESSION:  Dry needling/manual therapy if indicated. Traction. Balance and strength progression. Add core strength.  ERO next.     Lorrene Reid, PT 11/25/22 9:29 AM  [facility] [address], [address] [place], [place] [address] Phone # [phone] Fax [phone]  Fax # [phone]

## 2022-12-02 ENCOUNTER — Ambulatory Visit: Payer: 59 | Attending: Internal Medicine

## 2022-12-02 DIAGNOSIS — M6281 Muscle weakness (generalized): Secondary | ICD-10-CM

## 2022-12-02 DIAGNOSIS — R2681 Unsteadiness on feet: Secondary | ICD-10-CM

## 2022-12-02 DIAGNOSIS — R262 Difficulty in walking, not elsewhere classified: Secondary | ICD-10-CM

## 2022-12-02 DIAGNOSIS — R252 Cramp and spasm: Secondary | ICD-10-CM

## 2022-12-02 DIAGNOSIS — M5459 Other low back pain: Secondary | ICD-10-CM

## 2022-12-02 NOTE — Therapy (Signed)
OUTPATIENT PHYSICAL THERAPY TREATMENT   Patient Name: Gabriel Dalton MRN: [id number] DOB:1985-11-06, 37 y.o., male Today's Date: 12/02/2022   PCP: Steffanie Rainwater, MD/to transition to Dr. Berkley Harvey PROVIDER: Gust Rung, DO   END OF SESSION:  PT End of Session - 12/02/22 1017     Visit Number 16    Date for PT Re-Evaluation 01/16/23    Authorization Type [employer]    Authorization - Visit Number 16    Authorization - Number of Visits 50    PT Start Time 0932    PT Stop Time 1029    PT Time Calculation (min) 57 min    Activity Tolerance Patient tolerated treatment well    Behavior During Therapy [facility] for tasks assessed/performed                    Past Medical History:  Diagnosis Date   Abdominal fluid collection 07/11/2022   BPH (benign prostatic hyperplasia)    Bronchitis    Melena 04/25/2019   Multiple gastric ulcers    Peritonitis (HCC) 06/26/2022   Past Surgical History:  Procedure Laterality Date   CHOLECYSTECTOMY N/A 06/25/2022   Procedure: LAPAROSCOPIC CHOLECYSTECTOMY;  Surgeon: Quentin Ore, MD;  Location: [facility];  Service: General;  Laterality: N/A;   COLONOSCOPY     I & D EXTREMITY  10/18/2011   Procedure: IRRIGATION AND DEBRIDEMENT EXTREMITY;  Surgeon: Dominica Severin, MD;  Location: [facility];  Service: Orthopedics;  Laterality: Right;  Irrigation and Debridement  Right Middle Finger; Removal of foreign body   LAPAROTOMY N/A 06/27/2022   Procedure: EXPLORATORY LAPAROTOMY, WASH OUT,  ABDOMINAL CLOSURE;  Surgeon: Quentin Ore, MD;  Location: [facility];  Service: General;  Laterality: N/A;   Patient Active Problem List   Diagnosis Date Noted   Aortic calcification (HCC) 10/22/2022   Lumbar spondylosis with myelopathy 09/03/2022   Heart murmur 09/03/2022   Vitamin D deficiency 08/01/2022   Thiamine deficiency 08/01/2022   Electrolyte abnormality 07/09/2022   Benign prostatic hyperplasia 07/09/2022   Severe  protein-calorie malnutrition (HCC) 06/25/2022   Small bowel perforation (HCC) 06/25/2022   Cholelithiasis 06/24/2022   Early satiety 05/27/2022   Nausea and vomiting 05/27/2022   Esophagitis determined by endoscopy 04/23/2022   Abnormal colonoscopy 12/26/2021   Gastroesophageal reflux disease 12/26/2021   Normocytic anemia 04/25/2019   Gastric ulcers 04/25/2019   Generalized abdominal pain 04/01/2019    ONSET DATE: 09/03/2022 (MD referral); abdominal surgery 06/2022  REFERRING DIAG:  M47.816 (ICD-10-CM) - Lumbar spondylosis  M47.16 (ICD-10-CM) - Lumbar spondylosis with myelopathy    THERAPY DIAG:  Unsteadiness on feet  Muscle weakness (generalized)  Cramp and spasm  Difficulty in walking, not elsewhere classified  Rationale for Evaluation and Treatment: Rehabilitation  SUBJECTIVE:  SUBJECTIVE STATEMENT: My heels feel sore when I wear the AFOs.  When I take off the AFOs, I have a harder time lifting my toes.  This is really helping me.   Pt accompanied by: self  PERTINENT HISTORY:  Underwent exploratory laparotomy with small bowel resection with temporary abdominal closure on 06/25/22, and washout with abdominal closure on 06/27/22.  Patient goes to [facility] facility on 10/24/2022 to be fitted for AFO  PAIN:  Are you having pain? Yes: NPRS scale: 7-8/10 Pain location: low back Pain description: bloating, tight feeling Aggravating factors: eating Relieving factors: bowel movement   PRECAUTIONS: Other: Lifting restrictions  WEIGHT BEARING RESTRICTIONS: No  FALLS: Has patient fallen in last 6 months? Yes. Number of falls 1  LIVING ENVIRONMENT: Lives with: lives with their spouse Lives in: House/apartment Stairs: Yes: External: 6 steps; on right going up and on left going up Has  following equipment at home: Single point cane  PLOF: Independent and Vocation/Vocational requirements: truck driver  PATIENT GOALS: To get strength, balance, stability back  OBJECTIVE:   Below measures were taken at time of initial evaluation unless otherwise specified:   DIAGNOSTIC FINDINGS:  See MRI:  At L5-S1, there is mild disc degeneration. Small left subarticular disc protrusion (at site of posterior annular fissure). The disc protrusion results in slight left subarticular narrowing, and contacts the descending left S1 nerve root. No significant central canal stenosis.  COGNITION: Overall cognitive status: Within functional limits for tasks assessed   SENSATION: Light touch: Impaired   COORDINATION: Slightly slowed heel to shin  EDEMA:  Feet swell sometimes  POSTURE: No Significant postural limitations  LOWER EXTREMITY ROM:     Passive  Right Eval Left Eval Right 10/23/2022 Left 10/23/2022  Hip flexion      Hip extension      Hip abduction      Hip adduction      Hip internal rotation      Hip external rotation      Knee flexion      Knee extension      Ankle dorsiflexion 12 12 18 18   Ankle plantarflexion      Ankle inversion      Ankle eversion       (Blank rows = not tested)  LOWER EXTREMITY MMT:    MMT Right Eval Left Eval Right 10/23/2022 Right  12/02/22 Left 10/23/2022 Lt  12/02/22  Hip flexion 4 4 4+ 4+ 4+ 4+  Hip extension        Hip abduction        Hip adduction        Hip internal rotation        Hip external rotation        Knee flexion 4 3+ 4 4+ 4 4+  Knee extension 4+ 4+ 4+ 5 4+ 5  Ankle dorsiflexion 1 1 1 1 2 2   Ankle plantarflexion 3- 3-      Ankle inversion 3- 3-   3 3  Ankle eversion 3- 3-   3- 3-  (Blank rows = not tested)   TRANSFERS: Assistive device utilized: None  Sit to stand: Modified independence Stand to sit: Modified independence  GAIT: Gait pattern: step through pattern, decreased step length- Right,  decreased step length- Left, decreased ankle dorsiflexion- Right, decreased ankle dorsiflexion- Left, Right steppage, and Left steppage Distance walked: 50 ft x 2 Assistive device utilized: Single point cane Level of assistance: Modified independence  FUNCTIONAL TESTS:  Eval: 5 times  sit to stand: 19.97 sec arms crossed at chest Timed up and go (TUG): 17.62 10 meter walk test: 13.25 sec cane (2.48 ft/sec) DGI :  12/24 SLS:  RLE 4 sec, LLE 1 sec  09/30/2022: 5 times sit to stand: 9.64 sec arms crossed at chest  6.11.24: 5x sit to stand: 10 seconds   12/02/22: 6 min walk test: 1383 feet (age related norms 2379)    TODAY'S TREATMENT: Date: 12/02/22:  Nustep L7 x8 minutes BLEs only MMT and goal assessment 6 min walk test:  Trigger Point Dry-Needling  Treatment instructions: Expect mild to moderate muscle soreness. S/S of pneumothorax if dry needled over a lung field, and to seek immediate medical attention should they occur. Patient verbalized understanding of these instructions and education.  Patient Consent Given: Yes Education handout provided: Yes Muscles treated: bil lumbar paraspinals  Treatment response/outcome: Utilized skilled palpation to identify trigger points.  During dry needling able to palpate muscle twitch and muscle elongation   Skilled palpation and monitoring by PT during dry needling  Lumbar Traction: Intermittent 70# max, 35# min, hold 60 sec, rest 15 sec. x15 minutes  Date: 11/25/22:  Nustep L7 x5 minutes BLEs only Sit to stand holding 10# kettle bell 2x10 Standing on balance pad: hip abduction and exension bil 2 x 10 each Stepping over hurdles forward and lateral with min UE support Step up on Bosu Ball Rt and Lt with min to no UE support  Pallof press: Red band 2x10 bil each  Lumbar Traction: Intermittent 70# max, 35# min, hold 60 sec, rest 15 sec. x15 minutes   Date: 11/20/22:  Nustep L7 x5 minutes BLEs only Sit to stand holding 10# kettle bell  2x10 Standing on balance pad: hip abduction and exension bil 2 x 10 each Lumbar Traction: Intermittent 70# max, 35# min, hold 60 sec, rest 15 sec. x15 minutes    PATIENT EDUCATION: Education details: POC, progress towards goals, benefits of continued therapy to continue to address low pain pain and weakness associated with lumbar findings Person educated: Patient Education method: Explanation Education comprehension: verbalized understanding   HOME EXERCISE PROGRAM: Access Code: [email or web address] URL: [email or web address] Date: 11/25/2022 Prepared by: Tresa Endo  Program Notes Seated heel digs:  Sit at edge of computer (rolling) chair:  dig your heels and pull forward/push back.  Do NOT use your back for pulling forward/pushing back.  Exercises - Sit to Stand  - 1 x daily - 5 x weekly - 5 reps - Seated Long Arc Quad  - 1 x daily - 7 x weekly - 2 sets - 10 reps - Seated Anterior Pelvic Tilt  - 1-2 x daily - 7 x weekly - 10 reps - Standing Lumbar Extension  - 1-2 x daily - 7 x weekly - 10 reps - Wall Quarter Squat  - 1 x daily - 7 x weekly - 10 reps - Standing Gastroc Stretch at Counter  - 1 x daily - 7 x weekly - 1 sets - 3 reps - 30 sec hold - Prone Knee Flexion  - 1 x daily - 7 x weekly - 2 sets - 10 reps - Prone Hip Extension  - 1 x daily - 7 x weekly - 2 sets - 10 reps - Supine Transversus Abdominis Bracing - Hands on Thighs  - 1 x daily - 7 x weekly - 2 sets - 10 reps - Seated Calf Stretch with Strap  - 1 x daily - 5 x weekly - 2 sets -  5 reps - Romberg Stance with Eyes Closed  - 1 x daily - 5 x weekly - 2 sets - 30 sec hold - Narrow Stance with Counter Support  - 1 x daily - 5 x weekly - 2 sets - 30 sec hold - Standing Anti-Rotation Press with Anchored Resistance  - 2 x daily - 7 x weekly - 2 sets - 10 reps   GOALS: Goals reviewed with patient? Yes   Updated GOALS per 12/02/22 RECERT:  SHORT TERM GOALS: Target date: 11/06/2022  Pt will be independent with HEP for  improved strength, balance, decreased pain. Baseline: Goal status: MET  LONG TERM GOALS: Target date:01/16/23  Pt will be independent with final progression of HEP for improved strength, balance, transfers, and gait. Baseline:  Goal status: IN PROGRESS  2.  Pt will improve DGI score to at least 20/24 to decrease fall risk.  Baseline:  17/24  Goal Status:  In progress   3.  Pt will improve Lt ankle strength to 3-/5 and Rt ankle strength to 2/5 for improved functional mobility. Baseline: see above (12/02/22) Goal status: IN PROGRESS  4.  Pt will report improved pain in low back by 50% for improved functional mobility. Baseline: no change in symptoms (12/02/22) Goal status: INITIAL  5.  Pt will ambulate community distances and surfaces with AFOs and no device, independently for improved community mobility. Baseline: wearing bil AFOs and not cane  Goal status: MET 6. Improve 6 min walk test to 1900 feet   Baseline: 1383 feet  Goal status: new   ASSESSMENT:  CLINICAL IMPRESSION: Pt continues to adjust to new AFOs and is having a lot of foot pain.  PT advised he call the AFO company to discuss modification.  Pt denies any change in his LBP and was receptive to DN today.  Good response in bil lumbar multifidi with report of improved mobility and reduced pain.  6 min walk test without device was 1383 (norm for age is 2379 ft).  Pt is exercising regularly at home for endurance gains.  Improved knee strength today, no change in DF.  Patient continues to be motivated for improvement of strength and functional mobility; he will benefit from skilled PT to address the below impairments and improve overall function.  OBJECTIVE IMPAIRMENTS: Abnormal gait, decreased balance, decreased mobility, difficulty walking, decreased ROM, decreased strength, impaired flexibility, and impaired sensation.   ACTIVITY LIMITATIONS: carrying, lifting, bending, standing, stairs, transfers, and locomotion  level  PARTICIPATION LIMITATIONS: driving, community activity, and occupation  PERSONAL FACTORS: 3+ comorbidities: see above; recent abdominal surgery 06/2022  are also affecting patient's functional outcome.   REHAB POTENTIAL: Good  CLINICAL DECISION MAKING: Evolving/moderate complexity  EVALUATION COMPLEXITY: Moderate  PLAN:  PT FREQUENCY: 2x/week  PT DURATION: 6 weeks  PLANNED INTERVENTIONS: Therapeutic exercises, Therapeutic activity, Neuromuscular re-education, Balance training, Gait training, Patient/Family education, Self Care, Joint mobilization, Dry Needling, Electrical stimulation, Traction, and Manual therapy  PLAN FOR NEXT SESSION:  Dry needling/manual therapy if indicated. Traction. Balance and strength progression. DN if helpful.  DGI for LTG.   Lorrene Reid, PT 12/02/22 10:19 AM  [facility] [address], [address] [place], [place] [address] Phone # [phone] Fax [phone]  Fax # [phone]

## 2022-12-09 ENCOUNTER — Ambulatory Visit: Payer: 59 | Admitting: Rehabilitative and Restorative Service Providers"

## 2022-12-10 ENCOUNTER — Encounter: Payer: Self-pay | Admitting: Rehabilitative and Restorative Service Providers"

## 2022-12-10 ENCOUNTER — Ambulatory Visit: Payer: 59 | Admitting: Rehabilitative and Restorative Service Providers"

## 2022-12-10 DIAGNOSIS — R2681 Unsteadiness on feet: Secondary | ICD-10-CM

## 2022-12-10 DIAGNOSIS — R262 Difficulty in walking, not elsewhere classified: Secondary | ICD-10-CM

## 2022-12-10 DIAGNOSIS — M5459 Other low back pain: Secondary | ICD-10-CM

## 2022-12-10 DIAGNOSIS — M6281 Muscle weakness (generalized): Secondary | ICD-10-CM

## 2022-12-10 DIAGNOSIS — R252 Cramp and spasm: Secondary | ICD-10-CM

## 2022-12-10 NOTE — Therapy (Signed)
OUTPATIENT PHYSICAL THERAPY TREATMENT   Patient Name: Gabriel Dalton MRN: [id number] DOB:April 27, 1986, 37 y.o., male Today's Date: 12/10/2022   PCP: Steffanie Rainwater, MD/to transition to Dr. Berkley Harvey PROVIDER: Gust Rung, DO   END OF SESSION:  PT End of Session - 12/10/22 1019     Visit Number 17    Date for PT Re-Evaluation 01/16/23    Authorization Type [employer]    Authorization - Visit Number 17    Authorization - Number of Visits 50    PT Start Time 1015    PT Stop Time 1115    PT Time Calculation (min) 60 min    Activity Tolerance Patient tolerated treatment well    Behavior During Therapy [facility] for tasks assessed/performed                    Past Medical History:  Diagnosis Date   Abdominal fluid collection 07/11/2022   BPH (benign prostatic hyperplasia)    Bronchitis    Melena 04/25/2019   Multiple gastric ulcers    Peritonitis (HCC) 06/26/2022   Past Surgical History:  Procedure Laterality Date   CHOLECYSTECTOMY N/A 06/25/2022   Procedure: LAPAROSCOPIC CHOLECYSTECTOMY;  Surgeon: Quentin Ore, MD;  Location: [facility];  Service: General;  Laterality: N/A;   COLONOSCOPY     I & D EXTREMITY  10/18/2011   Procedure: IRRIGATION AND DEBRIDEMENT EXTREMITY;  Surgeon: Dominica Severin, MD;  Location: [facility];  Service: Orthopedics;  Laterality: Right;  Irrigation and Debridement  Right Middle Finger; Removal of foreign body   LAPAROTOMY N/A 06/27/2022   Procedure: EXPLORATORY LAPAROTOMY, WASH OUT,  ABDOMINAL CLOSURE;  Surgeon: Quentin Ore, MD;  Location: [facility];  Service: General;  Laterality: N/A;   Patient Active Problem List   Diagnosis Date Noted   Aortic calcification (HCC) 10/22/2022   Lumbar spondylosis with myelopathy 09/03/2022   Heart murmur 09/03/2022   Vitamin D deficiency 08/01/2022   Thiamine deficiency 08/01/2022   Electrolyte abnormality 07/09/2022   Benign prostatic hyperplasia 07/09/2022   Severe  protein-calorie malnutrition (HCC) 06/25/2022   Small bowel perforation (HCC) 06/25/2022   Cholelithiasis 06/24/2022   Early satiety 05/27/2022   Nausea and vomiting 05/27/2022   Esophagitis determined by endoscopy 04/23/2022   Abnormal colonoscopy 12/26/2021   Gastroesophageal reflux disease 12/26/2021   Normocytic anemia 04/25/2019   Gastric ulcers 04/25/2019   Generalized abdominal pain 04/01/2019    ONSET DATE: 09/03/2022 (MD referral); abdominal surgery 06/2022  REFERRING DIAG:  M47.816 (ICD-10-CM) - Lumbar spondylosis  M47.16 (ICD-10-CM) - Lumbar spondylosis with myelopathy    THERAPY DIAG:  Unsteadiness on feet  Muscle weakness (generalized)  Cramp and spasm  Difficulty in walking, not elsewhere classified  Other low back pain  Rationale for Evaluation and Treatment: Rehabilitation  SUBJECTIVE:  SUBJECTIVE STATEMENT: Pt reports that his friend is out of town, so he hasn't been able to work out with him.  Pt reports that he had to reschedule from his appointment yesterday secondary to GI distress from being cleared to eat more foods  Pt accompanied by: self  PERTINENT HISTORY:  Underwent exploratory laparotomy with small bowel resection with temporary abdominal closure on 06/25/22, and washout with abdominal closure on 06/27/22.  Patient goes to [facility] on 10/24/2022 to be fitted for AFO  PAIN:  Are you having pain? Yes: NPRS scale: 5/10 Pain location: right knee Pain description: tight feeling Aggravating factors: eating Relieving factors: bowel movement   PRECAUTIONS: Other: Lifting restrictions  WEIGHT BEARING RESTRICTIONS: No  FALLS: Has patient fallen in last 6 months? Yes. Number of falls 1  LIVING ENVIRONMENT: Lives with: lives with their spouse Lives in:  House/apartment Stairs: Yes: External: 6 steps; on right going up and on left going up Has following equipment at home: Single point cane  PLOF: Independent and Vocation/Vocational requirements: truck driver  PATIENT GOALS: To get strength, balance, stability back  OBJECTIVE:   Below measures were taken at time of initial evaluation unless otherwise specified:   DIAGNOSTIC FINDINGS:  See MRI:  At L5-S1, there is mild disc degeneration. Small left subarticular disc protrusion (at site of posterior annular fissure). The disc protrusion results in slight left subarticular narrowing, and contacts the descending left S1 nerve root. No significant central canal stenosis.  COGNITION: Overall cognitive status: Within functional limits for tasks assessed   SENSATION: Light touch: Impaired   COORDINATION: Slightly slowed heel to shin  EDEMA:  Feet swell sometimes  POSTURE: No Significant postural limitations  LOWER EXTREMITY ROM:     Passive  Right Eval Left Eval Right 10/23/2022 Left 10/23/2022  Hip flexion      Hip extension      Hip abduction      Hip adduction      Hip internal rotation      Hip external rotation      Knee flexion      Knee extension      Ankle dorsiflexion 12 12 18 18   Ankle plantarflexion      Ankle inversion      Ankle eversion       (Blank rows = not tested)  LOWER EXTREMITY MMT:    MMT Right Eval Left Eval Right 10/23/2022 Right  12/02/22 Left 10/23/2022 Lt  12/02/22  Hip flexion 4 4 4+ 4+ 4+ 4+  Hip extension        Hip abduction        Hip adduction        Hip internal rotation        Hip external rotation        Knee flexion 4 3+ 4 4+ 4 4+  Knee extension 4+ 4+ 4+ 5 4+ 5  Ankle dorsiflexion 1 1 1 1 2 2   Ankle plantarflexion 3- 3-      Ankle inversion 3- 3-   3 3  Ankle eversion 3- 3-   3- 3-  (Blank rows = not tested)   TRANSFERS: Assistive device utilized: None  Sit to stand: Modified independence Stand to sit: Modified  independence  GAIT: Gait pattern: step through pattern, decreased step length- Right, decreased step length- Left, decreased ankle dorsiflexion- Right, decreased ankle dorsiflexion- Left, Right steppage, and Left steppage Distance walked: 50 ft x 2 Assistive device utilized: Single point cane Level of  assistance: Modified independence  FUNCTIONAL TESTS:  Eval: 5 times sit to stand: 19.97 sec arms crossed at chest Timed up and go (TUG): 17.62 10 meter walk test: 13.25 sec cane (2.48 ft/sec) DGI :  12/24 SLS:  RLE 4 sec, LLE 1 sec  09/30/2022: 5 times sit to stand: 9.64 sec arms crossed at chest  6.11.24: 5x sit to stand: 10 seconds   12/02/22: 6 min walk test: 1383 feet (age related norms 2379)    TODAY'S TREATMENT:  Date: 12/10/2022 Nustep started at L5 then progressed up to L7 x8 minutes BLEs only with PT present to discuss status Sit to stand holding 10# kettle bell 2x10 Farmer's carry with 15# kettle bell from ortho to cancer gym and back x2 laps with weight in each hand Standing on balance pad: hip abduction and exension bil 2 x 10 each FWD and side lunge onto bosu x10 each bilat (required occasional UE support for balance) Lumbar Traction: Intermittent 75# max, 35# min, hold 60 sec, rest 15 sec. x15 minutes    Date: 12/02/22:  Nustep L7 x8 minutes BLEs only MMT and goal assessment 6 min walk test:  Trigger Point Dry-Needling  Treatment instructions: Expect mild to moderate muscle soreness. S/S of pneumothorax if dry needled over a lung field, and to seek immediate medical attention should they occur. Patient verbalized understanding of these instructions and education.  Patient Consent Given: Yes Education handout provided: Yes Muscles treated: bil lumbar paraspinals  Treatment response/outcome: Utilized skilled palpation to identify trigger points.  During dry needling able to palpate muscle twitch and muscle elongation   Skilled palpation and monitoring by PT during  dry needling  Lumbar Traction: Intermittent 70# max, 35# min, hold 60 sec, rest 15 sec. x15 minutes   Date: 11/25/22:  Nustep L7 x5 minutes BLEs only Sit to stand holding 10# kettle bell 2x10 Standing on balance pad: hip abduction and exension bil 2 x 10 each Stepping over hurdles forward and lateral with min UE support Step up on Bosu Ball Rt and Lt with min to no UE support  Pallof press: Red band 2x10 bil each  Lumbar Traction: Intermittent 70# max, 35# min, hold 60 sec, rest 15 sec. x15 minutes     PATIENT EDUCATION: Education details: POC, progress towards goals, benefits of continued therapy to continue to address low pain pain and weakness associated with lumbar findings Person educated: Patient Education method: Explanation Education comprehension: verbalized understanding   HOME EXERCISE PROGRAM: Access Code: [email or web address] URL: [email or web address] Date: 11/25/2022 Prepared by: Tresa Endo  Program Notes Seated heel digs:  Sit at edge of computer (rolling) chair:  dig your heels and pull forward/push back.  Do NOT use your back for pulling forward/pushing back.  Exercises - Sit to Stand  - 1 x daily - 5 x weekly - 5 reps - Seated Long Arc Quad  - 1 x daily - 7 x weekly - 2 sets - 10 reps - Seated Anterior Pelvic Tilt  - 1-2 x daily - 7 x weekly - 10 reps - Standing Lumbar Extension  - 1-2 x daily - 7 x weekly - 10 reps - Wall Quarter Squat  - 1 x daily - 7 x weekly - 10 reps - Standing Gastroc Stretch at Counter  - 1 x daily - 7 x weekly - 1 sets - 3 reps - 30 sec hold - Prone Knee Flexion  - 1 x daily - 7 x weekly - 2 sets - 10 reps -  Prone Hip Extension  - 1 x daily - 7 x weekly - 2 sets - 10 reps - Supine Transversus Abdominis Bracing - Hands on Thighs  - 1 x daily - 7 x weekly - 2 sets - 10 reps - Seated Calf Stretch with Strap  - 1 x daily - 5 x weekly - 2 sets - 5 reps - Romberg Stance with Eyes Closed  - 1 x daily - 5 x weekly - 2 sets - 30 sec hold -  Narrow Stance with Counter Support  - 1 x daily - 5 x weekly - 2 sets - 30 sec hold - Standing Anti-Rotation Press with Anchored Resistance  - 2 x daily - 7 x weekly - 2 sets - 10 reps   GOALS: Goals reviewed with patient? Yes   Updated GOALS per 12/02/22 RECERT:  SHORT TERM GOALS: Target date: 11/06/2022  Pt will be independent with HEP for improved strength, balance, decreased pain. Baseline: Goal status: MET  LONG TERM GOALS: Target date:01/16/23  Pt will be independent with final progression of HEP for improved strength, balance, transfers, and gait. Baseline:  Goal status: IN PROGRESS  2.  Pt will improve DGI score to at least 20/24 to decrease fall risk.  Baseline:  17/24  Goal Status:  In progress   3.  Pt will improve Lt ankle strength to 3-/5 and Rt ankle strength to 2/5 for improved functional mobility. Baseline: see above (12/02/22) Goal status: IN PROGRESS  4.  Pt will report improved pain in low back by 50% for improved functional mobility. Baseline: no change in symptoms (12/02/22) Goal status: INITIAL  5.  Pt will ambulate community distances and surfaces with AFOs and no device, independently for improved community mobility. Baseline: wearing bil AFOs and not cane  Goal status: MET 6. Improve 6 min walk test to 1900 feet   Baseline: 1383 feet  Goal status: new   ASSESSMENT:  CLINICAL IMPRESSION: Gabriel Dalton presents to skilled PT reporting feeling better than yesterday when he had to cancel.  States that he believes that he was having some GI distress from reintroducing various foods back into his diet.  Patient states that he was able to get an appointment with orthotist in 2 weeks to assess his AFOs.  Patient able to progress with balance and strengthening during session with minimal cuing for technique.  Pt did not report great relief of pain with dry needling, so proceeded with traction, which pt continues to report decreased pain following. OBJECTIVE  IMPAIRMENTS: Abnormal gait, decreased balance, decreased mobility, difficulty walking, decreased ROM, decreased strength, impaired flexibility, and impaired sensation.   ACTIVITY LIMITATIONS: carrying, lifting, bending, standing, stairs, transfers, and locomotion level  PARTICIPATION LIMITATIONS: driving, community activity, and occupation  PERSONAL FACTORS: 3+ comorbidities: see above; recent abdominal surgery 06/2022  are also affecting patient's functional outcome.   REHAB POTENTIAL: Good  CLINICAL DECISION MAKING: Evolving/moderate complexity  EVALUATION COMPLEXITY: Moderate  PLAN:  PT FREQUENCY: 2x/week  PT DURATION: 6 weeks  PLANNED INTERVENTIONS: Therapeutic exercises, Therapeutic activity, Neuromuscular re-education, Balance training, Gait training, Patient/Family education, Self Care, Joint mobilization, Dry Needling, Electrical stimulation, Traction, and Manual therapy  PLAN FOR NEXT SESSION:  Dry needling/manual therapy if indicated. Traction. Balance and strength progression. DGI for LTG.   Reather Laurence, PT, DPT 12/10/22, 11:48 AM  [facility] [address], [address] [place], [place] [address] Phone # [phone] Fax [phone]  Fax # [phone]

## 2022-12-16 ENCOUNTER — Encounter: Payer: Self-pay | Admitting: Rehabilitative and Restorative Service Providers"

## 2022-12-16 ENCOUNTER — Ambulatory Visit: Payer: 59 | Attending: Internal Medicine | Admitting: Rehabilitative and Restorative Service Providers"

## 2022-12-16 DIAGNOSIS — R262 Difficulty in walking, not elsewhere classified: Secondary | ICD-10-CM | POA: Diagnosis present

## 2022-12-16 DIAGNOSIS — M5459 Other low back pain: Secondary | ICD-10-CM | POA: Insufficient documentation

## 2022-12-16 DIAGNOSIS — M6281 Muscle weakness (generalized): Secondary | ICD-10-CM | POA: Insufficient documentation

## 2022-12-16 DIAGNOSIS — R252 Cramp and spasm: Secondary | ICD-10-CM | POA: Diagnosis present

## 2022-12-16 DIAGNOSIS — R2681 Unsteadiness on feet: Secondary | ICD-10-CM | POA: Insufficient documentation

## 2022-12-16 NOTE — Therapy (Signed)
OUTPATIENT PHYSICAL THERAPY TREATMENT   Patient Name: Gabriel Dalton MRN: [id number] DOB:1986-03-16, 37 y.o., male Today's Date: 12/16/2022   PCP: Gabriel Rainwater, MD/to transition to Dr. Berkley Harvey PROVIDER: Gust Rung, DO   END OF SESSION:  PT End of Session - 12/16/22 0850     Visit Number 18    Date for PT Re-Evaluation 01/16/23    Authorization Type [employer]    Authorization - Number of Visits 50    PT Start Time 0845    PT Stop Time 0945    PT Time Calculation (min) 60 min    Activity Tolerance Patient tolerated treatment well    Behavior During Therapy [facility] for tasks assessed/performed                    Past Medical History:  Diagnosis Date   Abdominal fluid collection 07/11/2022   BPH (benign prostatic hyperplasia)    Bronchitis    Melena 04/25/2019   Multiple gastric ulcers    Peritonitis (HCC) 06/26/2022   Past Surgical History:  Procedure Laterality Date   CHOLECYSTECTOMY N/A 06/25/2022   Procedure: LAPAROSCOPIC CHOLECYSTECTOMY;  Surgeon: Gabriel Ore, MD;  Location: [facility];  Service: General;  Laterality: N/A;   COLONOSCOPY     I & D EXTREMITY  10/18/2011   Procedure: IRRIGATION AND DEBRIDEMENT EXTREMITY;  Surgeon: Gabriel Severin, MD;  Location: [facility];  Service: Orthopedics;  Laterality: Right;  Irrigation and Debridement  Right Middle Finger; Removal of foreign body   LAPAROTOMY N/A 06/27/2022   Procedure: EXPLORATORY LAPAROTOMY, WASH OUT,  ABDOMINAL CLOSURE;  Surgeon: Gabriel Ore, MD;  Location: [facility];  Service: General;  Laterality: N/A;   Patient Active Problem List   Diagnosis Date Noted   Aortic calcification (HCC) 10/22/2022   Lumbar spondylosis with myelopathy 09/03/2022   Heart murmur 09/03/2022   Vitamin D deficiency 08/01/2022   Thiamine deficiency 08/01/2022   Electrolyte abnormality 07/09/2022   Benign prostatic hyperplasia 07/09/2022   Severe protein-calorie malnutrition (HCC)  06/25/2022   Small bowel perforation (HCC) 06/25/2022   Cholelithiasis 06/24/2022   Early satiety 05/27/2022   Nausea and vomiting 05/27/2022   Esophagitis determined by endoscopy 04/23/2022   Abnormal colonoscopy 12/26/2021   Gastroesophageal reflux disease 12/26/2021   Normocytic anemia 04/25/2019   Gastric ulcers 04/25/2019   Generalized abdominal pain 04/01/2019    ONSET DATE: 09/03/2022 (MD referral); abdominal surgery 06/2022  REFERRING DIAG:  M47.816 (ICD-10-CM) - Lumbar spondylosis  M47.16 (ICD-10-CM) - Lumbar spondylosis with myelopathy    THERAPY DIAG:  Unsteadiness on feet  Muscle weakness (generalized)  Cramp and spasm  Difficulty in walking, not elsewhere classified  Other low back pain  Rationale for Evaluation and Treatment: Rehabilitation  SUBJECTIVE:  SUBJECTIVE STATEMENT: Pt reports that "I'm feeling pretty good this morning."    Pt accompanied by: self  PERTINENT HISTORY:  Underwent exploratory laparotomy with small bowel resection with temporary abdominal closure on 06/25/22, and washout with abdominal closure on 06/27/22.  Patient goes to [facility] facility on 10/24/2022 to be fitted for AFO  PAIN:  Are you having pain? Yes: NPRS scale: 3/10 Pain location: right knee Pain description: tight feeling Aggravating factors: eating Relieving factors: bowel movement   PRECAUTIONS: Other: Lifting restrictions  WEIGHT BEARING RESTRICTIONS: No  FALLS: Has patient fallen in last 6 months? Yes. Number of falls 1  LIVING ENVIRONMENT: Lives with: lives with their spouse Lives in: House/apartment Stairs: Yes: External: 6 steps; on right going up and on left going up Has following equipment at home: Single point cane  PLOF: Independent and Vocation/Vocational requirements:  truck driver  PATIENT GOALS: To get strength, balance, stability back  OBJECTIVE:   Below measures were taken at time of initial evaluation unless otherwise specified:   DIAGNOSTIC FINDINGS:  See MRI:  At L5-S1, there is mild disc degeneration. Small left subarticular disc protrusion (at site of posterior annular fissure). The disc protrusion results in slight left subarticular narrowing, and contacts the descending left S1 nerve root. No significant central canal stenosis.  COGNITION: Overall cognitive status: Within functional limits for tasks assessed   SENSATION: Light touch: Impaired   COORDINATION: Slightly slowed heel to shin  EDEMA:  Feet swell sometimes  POSTURE: No Significant postural limitations  LOWER EXTREMITY ROM:     Passive  Right Eval Left Eval Right 10/23/2022 Left 10/23/2022  Hip flexion      Hip extension      Hip abduction      Hip adduction      Hip internal rotation      Hip external rotation      Knee flexion      Knee extension      Ankle dorsiflexion 12 12 18 18   Ankle plantarflexion      Ankle inversion      Ankle eversion       (Blank rows = not tested)  LOWER EXTREMITY MMT:    MMT Right Eval Left Eval Right 10/23/2022 Right  12/02/22 Left 10/23/2022 Lt  12/02/22  Hip flexion 4 4 4+ 4+ 4+ 4+  Hip extension        Hip abduction        Hip adduction        Hip internal rotation        Hip external rotation        Knee flexion 4 3+ 4 4+ 4 4+  Knee extension 4+ 4+ 4+ 5 4+ 5  Ankle dorsiflexion 1 1 1 1 2 2   Ankle plantarflexion 3- 3-      Ankle inversion 3- 3-   3 3  Ankle eversion 3- 3-   3- 3-  (Blank rows = not tested)   TRANSFERS: Assistive device utilized: None  Sit to stand: Modified independence Stand to sit: Modified independence  GAIT: Gait pattern: step through pattern, decreased step length- Right, decreased step length- Left, decreased ankle dorsiflexion- Right, decreased ankle dorsiflexion- Left, Right  steppage, and Left steppage Distance walked: 50 ft x 2 Assistive device utilized: Single point cane Level of assistance: Modified independence  FUNCTIONAL TESTS:  Eval: 5 times sit to stand: 19.97 sec arms crossed at chest Timed up and go (TUG): 17.62 10 meter walk test: 13.25 sec  cane (2.48 ft/sec) DGI :  12/24 SLS:  RLE 4 sec, LLE 1 sec  09/30/2022: 5 times sit to stand: 9.64 sec arms crossed at chest  6.11.24: 5x sit to stand: 10 seconds   12/02/22: 6 min walk test: 1383 feet (age related norms 2379)    TODAY'S TREATMENT:  Date: 12/10/2022 Nustep started at L5 x6 minutes BLEs only with PT present to discuss status Sit to stand holding 15# kettle bell 2x10 Standing overhead squat press with 7# in one hand x10 bilat, then 8# in one hand x10 bilat Standing on balance pad: hip abduction and exension bil 2 x 10 each Leg Press (seat at 6) 115# 2x10 Unilateral leg press (seat at 6) 55# 2x10 bilat Seated rows 40# 2x10 Standing lat pull-down 40# x10 Lumbar Traction: Intermittent 75# max, 35# min, hold 60 sec, rest 15 sec. x15 minutes    Date: 12/10/2022 Nustep started at L5 then progressed up to L7 x8 minutes BLEs only with PT present to discuss status Sit to stand holding 10# kettle bell 2x10 Farmer's carry with 15# kettle bell from ortho to cancer gym and back x2 laps with weight in each hand Standing on balance pad: hip abduction and exension bil 2 x 10 each FWD and side lunge onto bosu x10 each bilat (required occasional UE support for balance) Lumbar Traction: Intermittent 75# max, 35# min, hold 60 sec, rest 15 sec. x15 minutes    Date: 12/02/22:  Nustep L7 x8 minutes BLEs only MMT and goal assessment 6 min walk test:  Trigger Point Dry-Needling  Treatment instructions: Expect mild to moderate muscle soreness. S/S of pneumothorax if dry needled over a lung field, and to seek immediate medical attention should they occur. Patient verbalized understanding of these  instructions and education.  Patient Consent Given: Yes Education handout provided: Yes Muscles treated: bil lumbar paraspinals  Treatment response/outcome: Utilized skilled palpation to identify trigger points.  During dry needling able to palpate muscle twitch and muscle elongation   Skilled palpation and monitoring by PT during dry needling  Lumbar Traction: Intermittent 70# max, 35# min, hold 60 sec, rest 15 sec. x15 minutes      PATIENT EDUCATION: Education details: POC, progress towards goals, benefits of continued therapy to continue to address low pain pain and weakness associated with lumbar findings Person educated: Patient Education method: Explanation Education comprehension: verbalized understanding   HOME EXERCISE PROGRAM: Access Code: [email or web address] URL: [email or web address] Date: 11/25/2022 Prepared by: Tresa Endo  Program Notes Seated heel digs:  Sit at edge of computer (rolling) chair:  dig your heels and pull forward/push back.  Do NOT use your back for pulling forward/pushing back.  Exercises - Sit to Stand  - 1 x daily - 5 x weekly - 5 reps - Seated Long Arc Quad  - 1 x daily - 7 x weekly - 2 sets - 10 reps - Seated Anterior Pelvic Tilt  - 1-2 x daily - 7 x weekly - 10 reps - Standing Lumbar Extension  - 1-2 x daily - 7 x weekly - 10 reps - Wall Quarter Squat  - 1 x daily - 7 x weekly - 10 reps - Standing Gastroc Stretch at Counter  - 1 x daily - 7 x weekly - 1 sets - 3 reps - 30 sec hold - Prone Knee Flexion  - 1 x daily - 7 x weekly - 2 sets - 10 reps - Prone Hip Extension  - 1 x daily - 7 x weekly -  2 sets - 10 reps - Supine Transversus Abdominis Bracing - Hands on Thighs  - 1 x daily - 7 x weekly - 2 sets - 10 reps - Seated Calf Stretch with Strap  - 1 x daily - 5 x weekly - 2 sets - 5 reps - Romberg Stance with Eyes Closed  - 1 x daily - 5 x weekly - 2 sets - 30 sec hold - Narrow Stance with Counter Support  - 1 x daily - 5 x weekly - 2 sets - 30  sec hold - Standing Anti-Rotation Press with Anchored Resistance  - 2 x daily - 7 x weekly - 2 sets - 10 reps   GOALS: Goals reviewed with patient? Yes   Updated GOALS per 12/02/22 RECERT:  SHORT TERM GOALS: Target date: 11/06/2022  Pt will be independent with HEP for improved strength, balance, decreased pain. Baseline: Goal status: MET  LONG TERM GOALS: Target date:01/16/23  Pt will be independent with final progression of HEP for improved strength, balance, transfers, and gait. Baseline:  Goal status: IN PROGRESS  2.  Pt will improve DGI score to at least 20/24 to decrease fall risk.  Baseline:  17/24  Goal Status:  In progress   3.  Pt will improve Lt ankle strength to 3-/5 and Rt ankle strength to 2/5 for improved functional mobility. Baseline: see above (12/02/22) Goal status: IN PROGRESS  4.  Pt will report improved pain in low back by 50% for improved functional mobility. Baseline: no change in symptoms (12/02/22) Goal status: IN PROGRESS  5.  Pt will ambulate community distances and surfaces with AFOs and no device, independently for improved community mobility. Baseline: wearing bil AFOs and not cane  Goal status: MET  6. Improve 6 min walk test to 1900 feet   Baseline: 1383 feet  Goal status: new   ASSESSMENT:  CLINICAL IMPRESSION: Gabriel Dalton presents to skilled PT reporting that he is having a really good morning.  Patient able to progress during session with increased strengthening exercises with weight machines today.  Patient did not report any increased pain during exercise sessions today.  Patient provided with some cuing on technique and improved posture/body mechanics during new exercises.  Patient continues to report relief with use of lumbar traction. OBJECTIVE IMPAIRMENTS: Abnormal gait, decreased balance, decreased mobility, difficulty walking, decreased ROM, decreased strength, impaired flexibility, and impaired sensation.   ACTIVITY LIMITATIONS:  carrying, lifting, bending, standing, stairs, transfers, and locomotion level  PARTICIPATION LIMITATIONS: driving, community activity, and occupation  PERSONAL FACTORS: 3+ comorbidities: see above; recent abdominal surgery 06/2022  are also affecting patient's functional outcome.   REHAB POTENTIAL: Good  CLINICAL DECISION MAKING: Evolving/moderate complexity  EVALUATION COMPLEXITY: Moderate  PLAN:  PT FREQUENCY: 2x/week  PT DURATION: 6 weeks  PLANNED INTERVENTIONS: Therapeutic exercises, Therapeutic activity, Neuromuscular re-education, Balance training, Gait training, Patient/Family education, Self Care, Joint mobilization, Dry Needling, Electrical stimulation, Traction, and Manual therapy  PLAN FOR NEXT SESSION:  Dry needling/manual therapy if indicated. Traction. Balance and strength progression. DGI for LTG.   Reather Laurence, PT, DPT 12/16/22, 9:42 AM  [facility] [address] [place], [place] [address] Phone # [phone] Fax [phone]  Fax # [phone]

## 2022-12-21 ENCOUNTER — Other Ambulatory Visit: Payer: Self-pay | Admitting: Student

## 2022-12-21 DIAGNOSIS — R1084 Generalized abdominal pain: Secondary | ICD-10-CM

## 2022-12-22 NOTE — Telephone Encounter (Signed)
Next appt scheduled 8/27 with PCP.

## 2022-12-23 ENCOUNTER — Ambulatory Visit: Payer: 59

## 2022-12-23 DIAGNOSIS — R2681 Unsteadiness on feet: Secondary | ICD-10-CM | POA: Diagnosis not present

## 2022-12-23 DIAGNOSIS — R252 Cramp and spasm: Secondary | ICD-10-CM

## 2022-12-23 DIAGNOSIS — M6281 Muscle weakness (generalized): Secondary | ICD-10-CM

## 2022-12-23 DIAGNOSIS — M5459 Other low back pain: Secondary | ICD-10-CM

## 2022-12-23 DIAGNOSIS — R262 Difficulty in walking, not elsewhere classified: Secondary | ICD-10-CM

## 2022-12-23 NOTE — Therapy (Signed)
OUTPATIENT PHYSICAL THERAPY TREATMENT   Patient Name: Gabriel Dalton MRN: [id number] DOB:1986/03/11, 37 y.o., male Today's Date: 12/23/2022   PCP: Steffanie Rainwater, MD/to transition to Dr. Berkley Harvey PROVIDER: Gust Rung, DO   END OF SESSION:  PT End of Session - 12/23/22 0923     Visit Number 19    Date for PT Re-Evaluation 01/16/23    Authorization Type [employer]    Authorization - Visit Number 19    Authorization - Number of Visits 50    PT Start Time 0846    PT Stop Time 0936    PT Time Calculation (min) 50 min    Activity Tolerance Patient tolerated treatment well    Behavior During Therapy [facility] for tasks assessed/performed                     Past Medical History:  Diagnosis Date   Abdominal fluid collection 07/11/2022   BPH (benign prostatic hyperplasia)    Bronchitis    Melena 04/25/2019   Multiple gastric ulcers    Peritonitis (HCC) 06/26/2022   Past Surgical History:  Procedure Laterality Date   CHOLECYSTECTOMY N/A 06/25/2022   Procedure: LAPAROSCOPIC CHOLECYSTECTOMY;  Surgeon: Quentin Ore, MD;  Location: [facility];  Service: General;  Laterality: N/A;   COLONOSCOPY     I & D EXTREMITY  10/18/2011   Procedure: IRRIGATION AND DEBRIDEMENT EXTREMITY;  Surgeon: Dominica Severin, MD;  Location: [facility];  Service: Orthopedics;  Laterality: Right;  Irrigation and Debridement  Right Middle Finger; Removal of foreign body   LAPAROTOMY N/A 06/27/2022   Procedure: EXPLORATORY LAPAROTOMY, WASH OUT,  ABDOMINAL CLOSURE;  Surgeon: Quentin Ore, MD;  Location: [facility];  Service: General;  Laterality: N/A;   Patient Active Problem List   Diagnosis Date Noted   Aortic calcification (HCC) 10/22/2022   Lumbar spondylosis with myelopathy 09/03/2022   Heart murmur 09/03/2022   Vitamin D deficiency 08/01/2022   Thiamine deficiency 08/01/2022   Electrolyte abnormality 07/09/2022   Benign prostatic hyperplasia 07/09/2022   Severe  protein-calorie malnutrition (HCC) 06/25/2022   Small bowel perforation (HCC) 06/25/2022   Cholelithiasis 06/24/2022   Early satiety 05/27/2022   Nausea and vomiting 05/27/2022   Esophagitis determined by endoscopy 04/23/2022   Abnormal colonoscopy 12/26/2021   Gastroesophageal reflux disease 12/26/2021   Normocytic anemia 04/25/2019   Gastric ulcers 04/25/2019   Generalized abdominal pain 04/01/2019    ONSET DATE: 09/03/2022 (MD referral); abdominal surgery 06/2022  REFERRING DIAG:  M47.816 (ICD-10-CM) - Lumbar spondylosis  M47.16 (ICD-10-CM) - Lumbar spondylosis with myelopathy    THERAPY DIAG:  Unsteadiness on feet  Muscle weakness (generalized)  Cramp and spasm  Difficulty in walking, not elsewhere classified  Other low back pain  Rationale for Evaluation and Treatment: Rehabilitation  SUBJECTIVE:  SUBJECTIVE STATEMENT: I'm trying to get an appt to see a spine doctor.  My back is still bothering me.  I see the orthotist to get adjustments to my AFOs this month.    Pt accompanied by: self  PERTINENT HISTORY:  Underwent exploratory laparotomy with small bowel resection with temporary abdominal closure on 06/25/22, and washout with abdominal closure on 06/27/22.  Patient goes to [facility] facility on 10/24/2022 to be fitted for AFO  PAIN:  Are you having pain? Yes: NPRS scale: 5/10 Pain location: right knee Pain description: tight feeling Aggravating factors: eating Relieving factors: bowel movement   PRECAUTIONS: Other: Lifting restrictions  WEIGHT BEARING RESTRICTIONS: No  FALLS: Has patient fallen in last 6 months? Yes. Number of falls 1  LIVING ENVIRONMENT: Lives with: lives with their spouse Lives in: House/apartment Stairs: Yes: External: 6 steps; on right going up and on  left going up Has following equipment at home: Single point cane  PLOF: Independent and Vocation/Vocational requirements: truck driver  PATIENT GOALS: To get strength, balance, stability back  OBJECTIVE:   Below measures were taken at time of initial evaluation unless otherwise specified:   DIAGNOSTIC FINDINGS:  See MRI:  At L5-S1, there is mild disc degeneration. Small left subarticular disc protrusion (at site of posterior annular fissure). The disc protrusion results in slight left subarticular narrowing, and contacts the descending left S1 nerve root. No significant central canal stenosis.  COGNITION: Overall cognitive status: Within functional limits for tasks assessed   SENSATION: Light touch: Impaired   COORDINATION: Slightly slowed heel to shin  EDEMA:  Feet swell sometimes  POSTURE: No Significant postural limitations  LOWER EXTREMITY ROM:     Passive  Right Eval Left Eval Right 10/23/2022 Left 10/23/2022  Hip flexion      Hip extension      Hip abduction      Hip adduction      Hip internal rotation      Hip external rotation      Knee flexion      Knee extension      Ankle dorsiflexion 12 12 18 18   Ankle plantarflexion      Ankle inversion      Ankle eversion       (Blank rows = not tested)  LOWER EXTREMITY MMT:    MMT Right Eval Left Eval Right 10/23/2022 Right  12/02/22 Left 10/23/2022 Lt  12/02/22  Hip flexion 4 4 4+ 4+ 4+ 4+  Hip extension        Hip abduction        Hip adduction        Hip internal rotation        Hip external rotation        Knee flexion 4 3+ 4 4+ 4 4+  Knee extension 4+ 4+ 4+ 5 4+ 5  Ankle dorsiflexion 1 1 1 1 2 2   Ankle plantarflexion 3- 3-      Ankle inversion 3- 3-   3 3  Ankle eversion 3- 3-   3- 3-  (Blank rows = not tested)   TRANSFERS: Assistive device utilized: None  Sit to stand: Modified independence Stand to sit: Modified independence  GAIT: Gait pattern: step through pattern, decreased step  length- Right, decreased step length- Left, decreased ankle dorsiflexion- Right, decreased ankle dorsiflexion- Left, Right steppage, and Left steppage Distance walked: 50 ft x 2 Assistive device utilized: Single point cane Level of assistance: Modified independence  FUNCTIONAL TESTS:  Eval: 5  times sit to stand: 19.97 sec arms crossed at chest Timed up and go (TUG): 17.62 10 meter walk test: 13.25 sec cane (2.48 ft/sec) DGI :  12/24 SLS:  RLE 4 sec, LLE 1 sec  09/30/2022: 5 times sit to stand: 9.64 sec arms crossed at chest  6.11.24: 5x sit to stand: 10 seconds   12/02/22: 6 min walk test: 1383 feet (age related norms 2379)    TODAY'S TREATMENT: Date: 12/23/22 Nustep  L5 (old model) x6 minutes BLEs only with PT present to discuss status Sit to stand holding 15# kettle bell 3x10 Standing overhead squat press with 8# in each hand 2x10 Standing on balance pad: hip abduction and exension bil 2 x 10 each Leg Press (seat at 6) 115# 2x10 Unilateral leg press (seat at 6) 55# 2x10 bilat Seated rows 40# 2x10 Standing lat pull-down 40# x10 Resisted walking: 10# 4 ways x10 each  Lumbar Traction: Intermittent 78# max, 35# min, hold 60 sec, rest 15 sec. x15 minutes   Date:12/16/2022 Nustep started at L5 x6 minutes BLEs only with PT present to discuss status Sit to stand holding 15# kettle bell 2x10 Standing overhead squat press with 7# in one hand x10 bilat, then 8# in one hand x10 bilat Standing on balance pad: hip abduction and exension bil 2 x 10 each Leg Press (seat at 6) 115# 2x10 Unilateral leg press (seat at 6) 55# 2x10 bilat Seated rows 40# 2x10 Standing lat pull-down 40# x10 Lumbar Traction: Intermittent 75# max, 35# min, hold 60 sec, rest 15 sec. x15 minutes    Date: 12/10/2022 Nustep started at L5 then progressed up to L7 x8 minutes BLEs only with PT present to discuss status Sit to stand holding 10# kettle bell 2x10 Farmer's carry with 15# kettle bell from ortho to cancer  gym and back x2 laps with weight in each hand Standing on balance pad: hip abduction and exension bil 2 x 10 each FWD and side lunge onto bosu x10 each bilat (required occasional UE support for balance) Lumbar Traction: Intermittent 75# max, 35# min, hold 60 sec, rest 15 sec. x15 minutes    PATIENT EDUCATION: Education details: POC, progress towards goals, benefits of continued therapy to continue to address low pain pain and weakness associated with lumbar findings Person educated: Patient Education method: Explanation Education comprehension: verbalized understanding   HOME EXERCISE PROGRAM: Access Code: [email or web address] URL: [email or web address] Date: 11/25/2022 Prepared by: Tresa Endo  Program Notes Seated heel digs:  Sit at edge of computer (rolling) chair:  dig your heels and pull forward/push back.  Do NOT use your back for pulling forward/pushing back.  Exercises - Sit to Stand  - 1 x daily - 5 x weekly - 5 reps - Seated Long Arc Quad  - 1 x daily - 7 x weekly - 2 sets - 10 reps - Seated Anterior Pelvic Tilt  - 1-2 x daily - 7 x weekly - 10 reps - Standing Lumbar Extension  - 1-2 x daily - 7 x weekly - 10 reps - Wall Quarter Squat  - 1 x daily - 7 x weekly - 10 reps - Standing Gastroc Stretch at Counter  - 1 x daily - 7 x weekly - 1 sets - 3 reps - 30 sec hold - Prone Knee Flexion  - 1 x daily - 7 x weekly - 2 sets - 10 reps - Prone Hip Extension  - 1 x daily - 7 x weekly - 2 sets - 10 reps -  Supine Transversus Abdominis Bracing - Hands on Thighs  - 1 x daily - 7 x weekly - 2 sets - 10 reps - Seated Calf Stretch with Strap  - 1 x daily - 5 x weekly - 2 sets - 5 reps - Romberg Stance with Eyes Closed  - 1 x daily - 5 x weekly - 2 sets - 30 sec hold - Narrow Stance with Counter Support  - 1 x daily - 5 x weekly - 2 sets - 30 sec hold - Standing Anti-Rotation Press with Anchored Resistance  - 2 x daily - 7 x weekly - 2 sets - 10 reps   GOALS: Goals reviewed with patient?  Yes   Updated GOALS per 12/02/22 RECERT:  SHORT TERM GOALS: Target date: 11/06/2022  Pt will be independent with HEP for improved strength, balance, decreased pain. Baseline: Goal status: MET  LONG TERM GOALS: Target date:01/16/23  Pt will be independent with final progression of HEP for improved strength, balance, transfers, and gait. Baseline:  Goal status: IN PROGRESS  2.  Pt will improve DGI score to at least 20/24 to decrease fall risk.  Baseline:  17/24  Goal Status:  In progress   3.  Pt will improve Lt ankle strength to 3-/5 and Rt ankle strength to 2/5 for improved functional mobility. Baseline: see above (12/02/22) Goal status: IN PROGRESS  4.  Pt will report improved pain in low back by 50% for improved functional mobility. Baseline: no change in symptoms (12/02/22) Goal status: IN PROGRESS  5.  Pt will ambulate community distances and surfaces with AFOs and no device, independently for improved community mobility. Baseline: wearing bil AFOs and not cane  Goal status: MET  6. Improve 6 min walk test to 1900 feet   Baseline: 1383 feet  Goal status: new   ASSESSMENT:  CLINICAL IMPRESSION: Pt with continued LBP and is trying to get referral for specialist.  Pt continues to have some pain with new AFOs and has appt for assessment this month.  Pt did well with all weight training today including addition of resisted walking to work on balance and endurance.  PT provided with some cuing on technique and improved posture/body mechanics during new exercises.  Patient continues to report relief with use of lumbar traction.  Patient will benefit from skilled PT to address the below impairments and improve overall function.  OBJECTIVE IMPAIRMENTS: Abnormal gait, decreased balance, decreased mobility, difficulty walking, decreased ROM, decreased strength, impaired flexibility, and impaired sensation.   ACTIVITY LIMITATIONS: carrying, lifting, bending, standing, stairs, transfers,  and locomotion level  PARTICIPATION LIMITATIONS: driving, community activity, and occupation  PERSONAL FACTORS: 3+ comorbidities: see above; recent abdominal surgery 06/2022  are also affecting patient's functional outcome.   REHAB POTENTIAL: Good  CLINICAL DECISION MAKING: Evolving/moderate complexity  EVALUATION COMPLEXITY: Moderate  PLAN:  PT FREQUENCY: 2x/week  PT DURATION: 6 weeks  PLANNED INTERVENTIONS: Therapeutic exercises, Therapeutic activity, Neuromuscular re-education, Balance training, Gait training, Patient/Family education, Self Care, Joint mobilization, Dry Needling, Electrical stimulation, Traction, and Manual therapy  PLAN FOR NEXT SESSION:  Dry needling/manual therapy if indicated. Traction. Balance and strength progression. DGI  and 6 min walk test for LTGs. Determine if pt will continue after 01/16/23 and have pt schedule more appts if so.   Lorrene Reid, PT 12/23/22 9:26 AM   [facility] [address] [place], [place] [address] Phone # [phone] Fax [phone]  Fax # [phone]

## 2022-12-25 ENCOUNTER — Ambulatory Visit: Payer: 59 | Admitting: Rehabilitative and Restorative Service Providers"

## 2022-12-25 ENCOUNTER — Encounter: Payer: Self-pay | Admitting: Rehabilitative and Restorative Service Providers"

## 2022-12-25 DIAGNOSIS — M6281 Muscle weakness (generalized): Secondary | ICD-10-CM

## 2022-12-25 DIAGNOSIS — R252 Cramp and spasm: Secondary | ICD-10-CM

## 2022-12-25 DIAGNOSIS — R262 Difficulty in walking, not elsewhere classified: Secondary | ICD-10-CM

## 2022-12-25 DIAGNOSIS — M5459 Other low back pain: Secondary | ICD-10-CM

## 2022-12-25 DIAGNOSIS — R2681 Unsteadiness on feet: Secondary | ICD-10-CM

## 2022-12-25 NOTE — Therapy (Signed)
OUTPATIENT PHYSICAL THERAPY TREATMENT   Patient Name: Gabriel Dalton MRN: [id number] DOB:06-03-1985, 37 y.o., male Today's Date: 12/25/2022   PCP: Steffanie Rainwater, MD/to transition to Dr. Berkley Harvey PROVIDER: Gust Rung, DO   END OF SESSION:  PT End of Session - 12/25/22 0850     Visit Number 20    Date for PT Re-Evaluation 01/16/23    Authorization Type [employer]    Authorization - Visit Number 20    Authorization - Number of Visits 50    PT Start Time 0847    PT Stop Time 0940    PT Time Calculation (min) 53 min    Activity Tolerance Patient tolerated treatment well    Behavior During Therapy [facility] for tasks assessed/performed                     Past Medical History:  Diagnosis Date   Abdominal fluid collection 07/11/2022   BPH (benign prostatic hyperplasia)    Bronchitis    Melena 04/25/2019   Multiple gastric ulcers    Peritonitis (HCC) 06/26/2022   Past Surgical History:  Procedure Laterality Date   CHOLECYSTECTOMY N/A 06/25/2022   Procedure: LAPAROSCOPIC CHOLECYSTECTOMY;  Surgeon: Quentin Ore, MD;  Location: [facility];  Service: General;  Laterality: N/A;   COLONOSCOPY     I & D EXTREMITY  10/18/2011   Procedure: IRRIGATION AND DEBRIDEMENT EXTREMITY;  Surgeon: Dominica Severin, MD;  Location: [facility];  Service: Orthopedics;  Laterality: Right;  Irrigation and Debridement  Right Middle Finger; Removal of foreign body   LAPAROTOMY N/A 06/27/2022   Procedure: EXPLORATORY LAPAROTOMY, WASH OUT,  ABDOMINAL CLOSURE;  Surgeon: Quentin Ore, MD;  Location: [facility];  Service: General;  Laterality: N/A;   Patient Active Problem List   Diagnosis Date Noted   Aortic calcification (HCC) 10/22/2022   Lumbar spondylosis with myelopathy 09/03/2022   Heart murmur 09/03/2022   Vitamin D deficiency 08/01/2022   Thiamine deficiency 08/01/2022   Electrolyte abnormality 07/09/2022   Benign prostatic hyperplasia 07/09/2022   Severe  protein-calorie malnutrition (HCC) 06/25/2022   Small bowel perforation (HCC) 06/25/2022   Cholelithiasis 06/24/2022   Early satiety 05/27/2022   Nausea and vomiting 05/27/2022   Esophagitis determined by endoscopy 04/23/2022   Abnormal colonoscopy 12/26/2021   Gastroesophageal reflux disease 12/26/2021   Normocytic anemia 04/25/2019   Gastric ulcers 04/25/2019   Generalized abdominal pain 04/01/2019    ONSET DATE: 09/03/2022 (MD referral); abdominal surgery 06/2022  REFERRING DIAG:  M47.816 (ICD-10-CM) - Lumbar spondylosis  M47.16 (ICD-10-CM) - Lumbar spondylosis with myelopathy    THERAPY DIAG:  Unsteadiness on feet  Muscle weakness (generalized)  Cramp and spasm  Difficulty in walking, not elsewhere classified  Other low back pain  Rationale for Evaluation and Treatment: Rehabilitation  SUBJECTIVE:  SUBJECTIVE STATEMENT: I'm trying to get an appt to see a spine doctor.  My back is still bothering me.  I see the orthotist to get adjustments to my AFOs this month.    Pt accompanied by: self  PERTINENT HISTORY:  Underwent exploratory laparotomy with small bowel resection with temporary abdominal closure on 06/25/22, and washout with abdominal closure on 06/27/22.  Patient goes to [facility] facility on 10/24/2022 to be fitted for AFO  PAIN:  Are you having pain? Yes: NPRS scale: 5/10 Pain location: right knee Pain description: tight feeling Aggravating factors: eating Relieving factors: bowel movement   PRECAUTIONS: Other: Lifting restrictions  WEIGHT BEARING RESTRICTIONS: No  FALLS: Has patient fallen in last 6 months? Yes. Number of falls 1  LIVING ENVIRONMENT: Lives with: lives with their spouse Lives in: House/apartment Stairs: Yes: External: 6 steps; on right going up and on  left going up Has following equipment at home: Single point cane  PLOF: Independent and Vocation/Vocational requirements: truck driver  PATIENT GOALS: To get strength, balance, stability back  OBJECTIVE:   Below measures were taken at time of initial evaluation unless otherwise specified:   DIAGNOSTIC FINDINGS:  See MRI:  At L5-S1, there is mild disc degeneration. Small left subarticular disc protrusion (at site of posterior annular fissure). The disc protrusion results in slight left subarticular narrowing, and contacts the descending left S1 nerve root. No significant central canal stenosis.  COGNITION: Overall cognitive status: Within functional limits for tasks assessed   SENSATION: Light touch: Impaired   COORDINATION: Slightly slowed heel to shin  EDEMA:  Feet swell sometimes  POSTURE: No Significant postural limitations  LOWER EXTREMITY ROM:     Passive  Right Eval Left Eval Right 10/23/2022 Left 10/23/2022  Hip flexion      Hip extension      Hip abduction      Hip adduction      Hip internal rotation      Hip external rotation      Knee flexion      Knee extension      Ankle dorsiflexion 12 12 18 18   Ankle plantarflexion      Ankle inversion      Ankle eversion       (Blank rows = not tested)  LOWER EXTREMITY MMT:    MMT Right Eval Left Eval Right 10/23/2022 Right  12/02/22 Left 10/23/2022 Lt  12/02/22  Hip flexion 4 4 4+ 4+ 4+ 4+  Hip extension        Hip abduction        Hip adduction        Hip internal rotation        Hip external rotation        Knee flexion 4 3+ 4 4+ 4 4+  Knee extension 4+ 4+ 4+ 5 4+ 5  Ankle dorsiflexion 1 1 1 1 2 2   Ankle plantarflexion 3- 3-      Ankle inversion 3- 3-   3 3  Ankle eversion 3- 3-   3- 3-  (Blank rows = not tested)   TRANSFERS: Assistive device utilized: None  Sit to stand: Modified independence Stand to sit: Modified independence  GAIT: Gait pattern: step through pattern, decreased step  length- Right, decreased step length- Left, decreased ankle dorsiflexion- Right, decreased ankle dorsiflexion- Left, Right steppage, and Left steppage Distance walked: 50 ft x 2 Assistive device utilized: Single point cane Level of assistance: Modified independence  FUNCTIONAL TESTS:  Eval: 5  times sit to stand: 19.97 sec arms crossed at chest Timed up and go (TUG): 17.62 10 meter walk test: 13.25 sec cane (2.48 ft/sec) DGI :  12/24 SLS:  RLE 4 sec, LLE 1 sec  09/30/2022: 5 times sit to stand: 9.64 sec arms crossed at chest  6.11.24: 5x sit to stand: 10 seconds   12/02/22: 6 min walk test: 1383 feet (age related norms 2379)  12/25/2022: 6 min walk test:  1526 ft Single Leg Stance:  Right- 11.77 sec, Left 10.85 sec      TODAY'S TREATMENT:  Date: 12/25/2022 Nustep level 6 (green machine) x5 min with PT present to discuss status 6 minute walk 1,526 ft without increased pain Single leg stance on each leg Standing overhead squat press with 8# in each hand 2x10 Leg Press (seat at 6) 115# 2x10 Unilateral leg press (seat at 6) 55# 2x10 bilat Seated rows 40# 2x10 Standing lat pull-down 40# 2x10 4 way resisted gait with 10# cable pulley x5 each Standing L counter stretch 3x20 sec Lumbar Traction: Intermittent 78# max, 35# min, hold 60 sec, rest 15 sec. x15 minutes    Date: 12/23/22 Nustep  L5 (old model) x6 minutes BLEs only with PT present to discuss status Sit to stand holding 15# kettle bell 3x10 Standing overhead squat press with 8# in each hand 2x10 Standing on balance pad: hip abduction and exension bil 2 x 10 each Leg Press (seat at 6) 115# 2x10 Unilateral leg press (seat at 6) 55# 2x10 bilat Seated rows 40# 2x10 Standing lat pull-down 40# x10 Resisted walking: 10# 4 ways x10 each  Lumbar Traction: Intermittent 78# max, 35# min, hold 60 sec, rest 15 sec. x15 minutes   Date:12/16/2022 Nustep started at L5 x6 minutes BLEs only with PT present to discuss status Sit to  stand holding 15# kettle bell 2x10 Standing overhead squat press with 7# in one hand x10 bilat, then 8# in one hand x10 bilat Standing on balance pad: hip abduction and exension bil 2 x 10 each Leg Press (seat at 6) 115# 2x10 Unilateral leg press (seat at 6) 55# 2x10 bilat Seated rows 40# 2x10 Standing lat pull-down 40# x10 Lumbar Traction: Intermittent 75# max, 35# min, hold 60 sec, rest 15 sec. x15 minutes    PATIENT EDUCATION: Education details: POC, progress towards goals, benefits of continued therapy to continue to address low pain pain and weakness associated with lumbar findings Person educated: Patient Education method: Explanation Education comprehension: verbalized understanding   HOME EXERCISE PROGRAM: Access Code: [email or web address] URL: [email or web address] Date: 11/25/2022 Prepared by: Tresa Endo  Program Notes Seated heel digs:  Sit at edge of computer (rolling) chair:  dig your heels and pull forward/push back.  Do NOT use your back for pulling forward/pushing back.  Exercises - Sit to Stand  - 1 x daily - 5 x weekly - 5 reps - Seated Long Arc Quad  - 1 x daily - 7 x weekly - 2 sets - 10 reps - Seated Anterior Pelvic Tilt  - 1-2 x daily - 7 x weekly - 10 reps - Standing Lumbar Extension  - 1-2 x daily - 7 x weekly - 10 reps - Wall Quarter Squat  - 1 x daily - 7 x weekly - 10 reps - Standing Gastroc Stretch at Counter  - 1 x daily - 7 x weekly - 1 sets - 3 reps - 30 sec hold - Prone Knee Flexion  - 1 x daily - 7 x weekly -  2 sets - 10 reps - Prone Hip Extension  - 1 x daily - 7 x weekly - 2 sets - 10 reps - Supine Transversus Abdominis Bracing - Hands on Thighs  - 1 x daily - 7 x weekly - 2 sets - 10 reps - Seated Calf Stretch with Strap  - 1 x daily - 5 x weekly - 2 sets - 5 reps - Romberg Stance with Eyes Closed  - 1 x daily - 5 x weekly - 2 sets - 30 sec hold - Narrow Stance with Counter Support  - 1 x daily - 5 x weekly - 2 sets - 30 sec hold - Standing  Anti-Rotation Press with Anchored Resistance  - 2 x daily - 7 x weekly - 2 sets - 10 reps   GOALS: Goals reviewed with patient? Yes   Updated GOALS per 12/02/22 RECERT:  SHORT TERM GOALS: Target date: 11/06/2022  Pt will be independent with HEP for improved strength, balance, decreased pain. Baseline: Goal status: MET  LONG TERM GOALS: Target date:01/16/23  Pt will be independent with final progression of HEP for improved strength, balance, transfers, and gait. Baseline:  Goal status: IN PROGRESS  2.  Pt will improve DGI score to at least 20/24 to decrease fall risk.  Baseline:  17/24  Goal Status:  In progress   3.  Pt will improve Lt ankle strength to 3-/5 and Rt ankle strength to 2/5 for improved functional mobility. Baseline: see above (12/02/22) Goal status: IN PROGRESS  4.  Pt will report improved pain in low back by 50% for improved functional mobility. Baseline: no change in symptoms (12/02/22) Goal status: MET on 12/25/2022  5.  Pt will ambulate community distances and surfaces with AFOs and no device, independently for improved community mobility. Baseline: wearing bil AFOs and not cane  Goal status: MET  6. Improve 6 min walk test to 1900 feet   Baseline: 1383 feet  Goal status: Ongoing (see above)   ASSESSMENT:  CLINICAL IMPRESSION: [name] presents to skilled PT to report that he is feeling like he can walk better and was able to chase down his wife's Pomeranian this morning.  Patient with increased distance on 6 minute walk test and improved single leg balance noted.  Patient is progressing with overall improved balance and will assess DGI upcoming, but anticipate that this score has greatly improved, as well.  Patient states that he is thinking that he may require a back surgery, but is uncertain at this time.  OBJECTIVE IMPAIRMENTS: Abnormal gait, decreased balance, decreased mobility, difficulty walking, decreased ROM, decreased strength, impaired  flexibility, and impaired sensation.   ACTIVITY LIMITATIONS: carrying, lifting, bending, standing, stairs, transfers, and locomotion level  PARTICIPATION LIMITATIONS: driving, community activity, and occupation  PERSONAL FACTORS: 3+ comorbidities: see above; recent abdominal surgery 06/2022  are also affecting patient's functional outcome.   REHAB POTENTIAL: Good  CLINICAL DECISION MAKING: Evolving/moderate complexity  EVALUATION COMPLEXITY: Moderate  PLAN:  PT FREQUENCY: 2x/week  PT DURATION: 6 weeks  PLANNED INTERVENTIONS: Therapeutic exercises, Therapeutic activity, Neuromuscular re-education, Balance training, Gait training, Patient/Family education, Self Care, Joint mobilization, Dry Needling, Electrical stimulation, Traction, and Manual therapy  PLAN FOR NEXT SESSION:  Dry needling/manual therapy if indicated. Traction. Balance and strength progression. Dynamic Gait Index (DGI) Determine if pt will continue after 01/16/23 and have pt schedule more appts if so.    Reather Laurence, PT, DPT 12/25/22, 10:40 AM   [facility] [address] [place], [place] [address] Phone # [phone] Fax [phone]  Fax # [phone]

## 2022-12-30 ENCOUNTER — Ambulatory Visit: Payer: 59

## 2022-12-30 DIAGNOSIS — R2681 Unsteadiness on feet: Secondary | ICD-10-CM

## 2022-12-30 DIAGNOSIS — R262 Difficulty in walking, not elsewhere classified: Secondary | ICD-10-CM

## 2022-12-30 DIAGNOSIS — M5459 Other low back pain: Secondary | ICD-10-CM

## 2022-12-30 DIAGNOSIS — M6281 Muscle weakness (generalized): Secondary | ICD-10-CM

## 2022-12-30 DIAGNOSIS — R252 Cramp and spasm: Secondary | ICD-10-CM

## 2022-12-30 NOTE — Therapy (Signed)
OUTPATIENT PHYSICAL THERAPY TREATMENT   Patient Name: Gabriel Dalton MRN: [id number] DOB:[birth year], 37 y.o., male Today's Date: 12/30/2022   PCP: Steffanie Rainwater, MD/to transition to Dr. Berkley Harvey PROVIDER: Gust Rung, DO   END OF SESSION:  PT End of Session - 12/30/22 0941     Visit Number 21    Date for PT Re-Evaluation 01/16/23    Authorization Type [employer]    PT Start Time 0847    PT Stop Time 0930    PT Time Calculation (min) 43 min    Activity Tolerance Patient tolerated treatment well    Behavior During Therapy [facility] for tasks assessed/performed                      Past Medical History:  Diagnosis Date   Abdominal fluid collection 07/11/2022   BPH (benign prostatic hyperplasia)    Bronchitis    Melena 04/25/2019   Multiple gastric ulcers    Peritonitis (HCC) 06/26/2022   Past Surgical History:  Procedure Laterality Date   CHOLECYSTECTOMY N/A 06/25/2022   Procedure: LAPAROSCOPIC CHOLECYSTECTOMY;  Surgeon: Quentin Ore, MD;  Location: [facility];  Service: General;  Laterality: N/A;   COLONOSCOPY     I & D EXTREMITY  10/18/2011   Procedure: IRRIGATION AND DEBRIDEMENT EXTREMITY;  Surgeon: Dominica Severin, MD;  Location: [facility];  Service: Orthopedics;  Laterality: Right;  Irrigation and Debridement  Right Middle Finger; Removal of foreign body   LAPAROTOMY N/A 06/27/2022   Procedure: EXPLORATORY LAPAROTOMY, WASH OUT,  ABDOMINAL CLOSURE;  Surgeon: Quentin Ore, MD;  Location: [facility];  Service: General;  Laterality: N/A;   Patient Active Problem List   Diagnosis Date Noted   Aortic calcification (HCC) 10/22/2022   Lumbar spondylosis with myelopathy 09/03/2022   Heart murmur 09/03/2022   Vitamin D deficiency 08/01/2022   Thiamine deficiency 08/01/2022   Electrolyte abnormality 07/09/2022   Benign prostatic hyperplasia 07/09/2022   Severe protein-calorie malnutrition (HCC) 06/25/2022   Small bowel perforation (HCC)  06/25/2022   Cholelithiasis 06/24/2022   Early satiety 05/27/2022   Nausea and vomiting 05/27/2022   Esophagitis determined by endoscopy 04/23/2022   Abnormal colonoscopy 12/26/2021   Gastroesophageal reflux disease 12/26/2021   Normocytic anemia 04/25/2019   Gastric ulcers 04/25/2019   Generalized abdominal pain 04/01/2019    ONSET DATE: 09/03/2022 (MD referral); abdominal surgery 06/2022  REFERRING DIAG:  M47.816 (ICD-10-CM) - Lumbar spondylosis  M47.16 (ICD-10-CM) - Lumbar spondylosis with myelopathy    THERAPY DIAG:  Unsteadiness on feet  Muscle weakness (generalized)  Cramp and spasm  Difficulty in walking, not elsewhere classified  Other low back pain  Rationale for Evaluation and Treatment: Rehabilitation  SUBJECTIVE:  SUBJECTIVE STATEMENT: My back has not been bothering me at all for the past 3 days.  Therapy is really helping me to get stronger.      Pt accompanied by: self  PERTINENT HISTORY:  Underwent exploratory laparotomy with small bowel resection with temporary abdominal closure on 06/25/22, and washout with abdominal closure on 06/27/22.  Patient goes to [facility] facility on 10/24/2022 to be fitted for AFO  PAIN:  Are you having pain? Yes: NPRS scale: 0/10 Pain location: right knee Pain description: tight feeling Aggravating factors: eating Relieving factors: bowel movement   PRECAUTIONS: Other: Lifting restrictions  WEIGHT BEARING RESTRICTIONS: No  FALLS: Has patient fallen in last 6 months? Yes. Number of falls 1  LIVING ENVIRONMENT: Lives with: lives with their spouse Lives in: House/apartment Stairs: Yes: External: 6 steps; on right going up and on left going up Has following equipment at home: Single point cane  PLOF: Independent and Vocation/Vocational  requirements: truck driver  PATIENT GOALS: To get strength, balance, stability back  OBJECTIVE:   Below measures were taken at time of initial evaluation unless otherwise specified:   DIAGNOSTIC FINDINGS:  See MRI:  At L5-S1, there is mild disc degeneration. Small left subarticular disc protrusion (at site of posterior annular fissure). The disc protrusion results in slight left subarticular narrowing, and contacts the descending left S1 nerve root. No significant central canal stenosis.  COGNITION: Overall cognitive status: Within functional limits for tasks assessed   SENSATION: Light touch: Impaired   COORDINATION: Slightly slowed heel to shin  EDEMA:  Feet swell sometimes  POSTURE: No Significant postural limitations  LOWER EXTREMITY ROM:     Passive  Right Eval Left Eval Right 10/23/2022 Left 10/23/2022  Hip flexion      Hip extension      Hip abduction      Hip adduction      Hip internal rotation      Hip external rotation      Knee flexion      Knee extension      Ankle dorsiflexion 12 12 18 18   Ankle plantarflexion      Ankle inversion      Ankle eversion       (Blank rows = not tested)  LOWER EXTREMITY MMT:    MMT Right Eval Left Eval Right 10/23/2022 Right  12/02/22 Left 10/23/2022 Lt  12/02/22  Hip flexion 4 4 4+ 4+ 4+ 4+  Hip extension        Hip abduction        Hip adduction        Hip internal rotation        Hip external rotation        Knee flexion 4 3+ 4 4+ 4 4+  Knee extension 4+ 4+ 4+ 5 4+ 5  Ankle dorsiflexion 1 1 1 1 2 2   Ankle plantarflexion 3- 3-      Ankle inversion 3- 3-   3 3  Ankle eversion 3- 3-   3- 3-  (Blank rows = not tested)   TRANSFERS: Assistive device utilized: None  Sit to stand: Modified independence Stand to sit: Modified independence  GAIT: Gait pattern: step through pattern, decreased step length- Right, decreased step length- Left, decreased ankle dorsiflexion- Right, decreased ankle dorsiflexion-  Left, Right steppage, and Left steppage Distance walked: 50 ft x 2 Assistive device utilized: Single point cane Level of assistance: Modified independence  FUNCTIONAL TESTS:  Eval: 5 times sit to stand: 19.97 sec  arms crossed at chest Timed up and go (TUG): 17.62 10 meter walk test: 13.25 sec cane (2.48 ft/sec) DGI :  12/24 SLS:  RLE 4 sec, LLE 1 sec  09/30/2022: 5 times sit to stand: 9.64 sec arms crossed at chest  6.11.24: 5x sit to stand: 10 seconds   12/02/22: 6 min walk test: 1383 feet (age related norms 2379)  12/25/2022: 6 min walk test:  1526 ft Single Leg Stance:  Right- 11.77 sec, Left 10.85 sec      TODAY'S TREATMENT: Date: 12/30/2022 Nustep level 5 (old model) x8 min with PT present to discuss status Forward lunge to Bosu using min UE on poles 2x10 bil each Standing overhead squat press with 8# in each hand 1x10 then 9# 1x10 Leg Press (seat at 6) 115# 2x10 Unilateral leg press (seat at 6) 55# 2x10 bilat Seated rows 40# 2x10 Standing lat pull-down 40# 2x10 4 way resisted gait with 15# cable pulley x5 each Sidestepping with blue loop around thighs  Supine on foam roll to stretch surgical incision   Date: 12/25/2022 Nustep level 6 (green machine) x5 min with PT present to discuss status 6 minute walk 1,526 ft without increased pain Single leg stance on each leg Standing overhead squat press with 8# in each hand 2x10 Leg Press (seat at 6) 115# 2x10 Unilateral leg press (seat at 6) 55# 2x10 bilat Seated rows 40# 2x10 Standing lat pull-down 40# 2x10 4 way resisted gait with 10# cable pulley x5 each Standing L counter stretch 3x20 sec Lumbar Traction: Intermittent 78# max, 35# min, hold 60 sec, rest 15 sec. x15 minutes    Date: 12/23/22 Nustep  L5 (old model) x6 minutes BLEs only with PT present to discuss status Sit to stand holding 15# kettle bell 3x10 Standing overhead squat press with 8# in each hand 2x10 Standing on balance pad: hip abduction and  exension bil 2 x 10 each Leg Press (seat at 6) 115# 2x10 Unilateral leg press (seat at 6) 55# 2x10 bilat Seated rows 40# 2x10 Standing lat pull-down 40# x10 Resisted walking: 10# 4 ways x10 each  Lumbar Traction: Intermittent 78# max, 35# min, hold 60 sec, rest 15 sec. x15 minutes   PATIENT EDUCATION: Education details: POC, progress towards goals, benefits of continued therapy to continue to address low pain pain and weakness associated with lumbar findings Person educated: Patient Education method: Explanation Education comprehension: verbalized understanding   HOME EXERCISE PROGRAM: Access Code: [email or web address] URL: [email or web address] Date: 11/25/2022 Prepared by: Tresa Endo  Program Notes Seated heel digs:  Sit at edge of computer (rolling) chair:  dig your heels and pull forward/push back.  Do NOT use your back for pulling forward/pushing back.  Exercises - Sit to Stand  - 1 x daily - 5 x weekly - 5 reps - Seated Long Arc Quad  - 1 x daily - 7 x weekly - 2 sets - 10 reps - Seated Anterior Pelvic Tilt  - 1-2 x daily - 7 x weekly - 10 reps - Standing Lumbar Extension  - 1-2 x daily - 7 x weekly - 10 reps - Wall Quarter Squat  - 1 x daily - 7 x weekly - 10 reps - Standing Gastroc Stretch at Counter  - 1 x daily - 7 x weekly - 1 sets - 3 reps - 30 sec hold - Prone Knee Flexion  - 1 x daily - 7 x weekly - 2 sets - 10 reps - Prone Hip Extension  - 1  x daily - 7 x weekly - 2 sets - 10 reps - Supine Transversus Abdominis Bracing - Hands on Thighs  - 1 x daily - 7 x weekly - 2 sets - 10 reps - Seated Calf Stretch with Strap  - 1 x daily - 5 x weekly - 2 sets - 5 reps - Romberg Stance with Eyes Closed  - 1 x daily - 5 x weekly - 2 sets - 30 sec hold - Narrow Stance with Counter Support  - 1 x daily - 5 x weekly - 2 sets - 30 sec hold - Standing Anti-Rotation Press with Anchored Resistance  - 2 x daily - 7 x weekly - 2 sets - 10 reps   GOALS: Goals reviewed with patient?  Yes   Updated GOALS per 12/02/22 RECERT:  SHORT TERM GOALS: Target date: 11/06/2022  Pt will be independent with HEP for improved strength, balance, decreased pain. Baseline: Goal status: MET  LONG TERM GOALS: Target date:01/16/23  Pt will be independent with final progression of HEP for improved strength, balance, transfers, and gait. Baseline:  Goal status: IN PROGRESS  2.  Pt will improve DGI score to at least 20/24 to decrease fall risk.  Baseline:  17/24  Goal Status:  In progress   3.  Pt will improve Lt ankle strength to 3-/5 and Rt ankle strength to 2/5 for improved functional mobility. Baseline: see above (12/02/22) Goal status: IN PROGRESS  4.  Pt will report improved pain in low back by 50% for improved functional mobility. Baseline: no change in symptoms (12/02/22) Goal status: MET on 12/25/2022  5.  Pt will ambulate community distances and surfaces with AFOs and no device, independently for improved community mobility. Baseline: wearing bil AFOs and not cane  Goal status: MET  6. Improve 6 min walk test to 1900 feet   Baseline: 1526 ft (12/25/22)  Goal status: Ongoing (see above)   ASSESSMENT:  CLINICAL IMPRESSION: Pt continues to make strength, endurance and mobility gains.  He denies any LBP x 3-4 days. He was able to increase weights with exercise today. Patient is progressing with overall improved balance and will assess DGI upcoming, but anticipate that this score has greatly improved, as well. Trial to remove traction from treatment today to determine impact on symptoms.   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  Patient will benefit from skilled PT to address the below impairments and improve overall function.   OBJECTIVE IMPAIRMENTS: Abnormal gait, decreased balance, decreased mobility, difficulty walking, decreased ROM, decreased strength, impaired  flexibility, and impaired sensation.   ACTIVITY LIMITATIONS: carrying, lifting, bending, standing, stairs, transfers, and locomotion level  PARTICIPATION LIMITATIONS: driving, community activity, and occupation  PERSONAL FACTORS: 3+ comorbidities: see above; recent abdominal surgery 06/2022  are also affecting patient's functional outcome.   REHAB POTENTIAL: Good  CLINICAL DECISION MAKING: Evolving/moderate complexity  EVALUATION COMPLEXITY: Moderate  PLAN:  PT FREQUENCY: 2x/week  PT DURATION: 6 weeks  PLANNED INTERVENTIONS: Therapeutic exercises, Therapeutic activity, Neuromuscular re-education, Balance training, Gait training, Patient/Family education, Self Care, Joint mobilization, Dry Needling, Electrical stimulation, Traction, and Manual therapy  PLAN FOR NEXT SESSION:  Dry needling/manual therapy if indicated. Traction. Balance and strength progression. Dynamic Gait Index (DGI) Determine if pt will continue after 01/16/23 and have pt schedule more appts if so.    Lorrene Reid, PT 12/30/22 9:42 AM    [facility] [address], [address] [place], [place] [address] Phone # [phone] Fax [phone]  Fax # [phone]

## 2023-01-01 ENCOUNTER — Ambulatory Visit: Payer: 59

## 2023-01-01 DIAGNOSIS — R252 Cramp and spasm: Secondary | ICD-10-CM

## 2023-01-01 DIAGNOSIS — R262 Difficulty in walking, not elsewhere classified: Secondary | ICD-10-CM

## 2023-01-01 DIAGNOSIS — M5459 Other low back pain: Secondary | ICD-10-CM

## 2023-01-01 DIAGNOSIS — R2681 Unsteadiness on feet: Secondary | ICD-10-CM

## 2023-01-01 DIAGNOSIS — M6281 Muscle weakness (generalized): Secondary | ICD-10-CM

## 2023-01-01 NOTE — Therapy (Signed)
OUTPATIENT PHYSICAL THERAPY TREATMENT   Patient Name: Gabriel Dalton MRN: [id number] DOB:1985/05/30, 37 y.o., male Today's Date: 01/01/2023   PCP: Steffanie Rainwater, MD/to transition to Dr. Berkley Harvey PROVIDER: Gust Rung, DO   END OF SESSION:  PT End of Session - 01/01/23 0846     Visit Number 22    Date for PT Re-Evaluation 01/16/23    Authorization Type [employer]    PT Start Time 0803    PT Stop Time 0845    PT Time Calculation (min) 42 min    Activity Tolerance Patient tolerated treatment well    Behavior During Therapy [facility] for tasks assessed/performed                       Past Medical History:  Diagnosis Date   Abdominal fluid collection 07/11/2022   BPH (benign prostatic hyperplasia)    Bronchitis    Melena 04/25/2019   Multiple gastric ulcers    Peritonitis (HCC) 06/26/2022   Past Surgical History:  Procedure Laterality Date   CHOLECYSTECTOMY N/A 06/25/2022   Procedure: LAPAROSCOPIC CHOLECYSTECTOMY;  Surgeon: Quentin Ore, MD;  Location: [facility];  Service: General;  Laterality: N/A;   COLONOSCOPY     I & D EXTREMITY  10/18/2011   Procedure: IRRIGATION AND DEBRIDEMENT EXTREMITY;  Surgeon: Dominica Severin, MD;  Location: [facility];  Service: Orthopedics;  Laterality: Right;  Irrigation and Debridement  Right Middle Finger; Removal of foreign body   LAPAROTOMY N/A 06/27/2022   Procedure: EXPLORATORY LAPAROTOMY, WASH OUT,  ABDOMINAL CLOSURE;  Surgeon: Quentin Ore, MD;  Location: [facility];  Service: General;  Laterality: N/A;   Patient Active Problem List   Diagnosis Date Noted   Aortic calcification (HCC) 10/22/2022   Lumbar spondylosis with myelopathy 09/03/2022   Heart murmur 09/03/2022   Vitamin D deficiency 08/01/2022   Thiamine deficiency 08/01/2022   Electrolyte abnormality 07/09/2022   Benign prostatic hyperplasia 07/09/2022   Severe protein-calorie malnutrition (HCC) 06/25/2022   Small bowel perforation  (HCC) 06/25/2022   Cholelithiasis 06/24/2022   Early satiety 05/27/2022   Nausea and vomiting 05/27/2022   Esophagitis determined by endoscopy 04/23/2022   Abnormal colonoscopy 12/26/2021   Gastroesophageal reflux disease 12/26/2021   Normocytic anemia 04/25/2019   Gastric ulcers 04/25/2019   Generalized abdominal pain 04/01/2019    ONSET DATE: 09/03/2022 (MD referral); abdominal surgery 06/2022  REFERRING DIAG:  M47.816 (ICD-10-CM) - Lumbar spondylosis  M47.16 (ICD-10-CM) - Lumbar spondylosis with myelopathy    THERAPY DIAG:  Unsteadiness on feet  Muscle weakness (generalized)  Cramp and spasm  Difficulty in walking, not elsewhere classified  Other low back pain  Rationale for Evaluation and Treatment: Rehabilitation  SUBJECTIVE:  SUBJECTIVE STATEMENT: LBP is significantly better over the past week. Therapy is really helping me to get stronger.      Pt accompanied by: self  PERTINENT HISTORY:  Underwent exploratory laparotomy with small bowel resection with temporary abdominal closure on 06/25/22, and washout with abdominal closure on 06/27/22.  Patient goes to [facility] facility on 10/24/2022 to be fitted for AFO  PAIN:  Are you having pain? Yes: NPRS scale: 0/10 Pain location: right knee Pain description: tight feeling Aggravating factors: eating Relieving factors: bowel movement   PRECAUTIONS: Other: Lifting restrictions  WEIGHT BEARING RESTRICTIONS: No  FALLS: Has patient fallen in last 6 months? Yes. Number of falls 1  LIVING ENVIRONMENT: Lives with: lives with their spouse Lives in: House/apartment Stairs: Yes: External: 6 steps; on right going up and on left going up Has following equipment at home: Single point cane  PLOF: Independent and Vocation/Vocational  requirements: truck driver  PATIENT GOALS: To get strength, balance, stability back  OBJECTIVE:   Below measures were taken at time of initial evaluation unless otherwise specified:   DIAGNOSTIC FINDINGS:  See MRI:  At L5-S1, there is mild disc degeneration. Small left subarticular disc protrusion (at site of posterior annular fissure). The disc protrusion results in slight left subarticular narrowing, and contacts the descending left S1 nerve root. No significant central canal stenosis.  COGNITION: Overall cognitive status: Within functional limits for tasks assessed   SENSATION: Light touch: Impaired   COORDINATION: Slightly slowed heel to shin  EDEMA:  Feet swell sometimes  POSTURE: No Significant postural limitations  LOWER EXTREMITY ROM:     Passive  Right Eval Left Eval Right 10/23/2022 Left 10/23/2022  Hip flexion      Hip extension      Hip abduction      Hip adduction      Hip internal rotation      Hip external rotation      Knee flexion      Knee extension      Ankle dorsiflexion 12 12 18 18   Ankle plantarflexion      Ankle inversion      Ankle eversion       (Blank rows = not tested)  LOWER EXTREMITY MMT:    MMT Right Eval Left Eval Right 10/23/2022 Right  12/02/22 Left 10/23/2022 Lt  12/02/22  Hip flexion 4 4 4+ 4+ 4+ 4+  Hip extension        Hip abduction        Hip adduction        Hip internal rotation        Hip external rotation        Knee flexion 4 3+ 4 4+ 4 4+  Knee extension 4+ 4+ 4+ 5 4+ 5  Ankle dorsiflexion 1 1 1 1 2 2   Ankle plantarflexion 3- 3-      Ankle inversion 3- 3-   3 3  Ankle eversion 3- 3-   3- 3-  (Blank rows = not tested)   TRANSFERS: Assistive device utilized: None  Sit to stand: Modified independence Stand to sit: Modified independence  GAIT: Gait pattern: step through pattern, decreased step length- Right, decreased step length- Left, decreased ankle dorsiflexion- Right, decreased ankle dorsiflexion-  Left, Right steppage, and Left steppage Distance walked: 50 ft x 2 Assistive device utilized: Single point cane Level of assistance: Modified independence  FUNCTIONAL TESTS:  Eval: 5 times sit to stand: 19.97 sec arms crossed at chest Timed up and  go (TUG): 17.62 10 meter walk test: 13.25 sec cane (2.48 ft/sec) DGI :  12/24 SLS:  RLE 4 sec, LLE 1 sec  09/30/2022: 5 times sit to stand: 9.64 sec arms crossed at chest  6.11.24: 5x sit to stand: 10 seconds   12/02/22: 6 min walk test: 1383 feet (age related norms 2379)  12/25/2022: 6 min walk test:  1526 ft Single Leg Stance:  Right- 11.77 sec, Left 10.85 sec      TODAY'S TREATMENT: Date: 01/01/2023 Nustep level 7 (old model) x8 min with PT present to discuss status Forward lunge to Bosu using min UE on poles 2x10 bil each Standing overhead squat press with 8# in each hand 1x10 then 9# 1x10 Leg Press (seat at 6) 115# 2x10 Unilateral leg press (seat at 6) 55# 2x10 bilat Seated rows 40# 2x10 Hurdles: step over step gait forward, step to sidestepping without UE support Sidestepping with blue loop around thighs   Date: 12/30/2022 Nustep level 5 (old model) x8 min with PT present to discuss status Forward lunge to Bosu using min UE on poles 2x10 bil each Standing overhead squat press with 8# in each hand 1x10 then 9# 1x10 Leg Press (seat at 6) 115# 2x10 Unilateral leg press (seat at 6) 55# 2x10 bilat Seated rows 40# 2x10 Standing lat pull-down 40# 2x10 4 way resisted gait with 15# cable pulley x5 each Sidestepping with blue loop around thighs  Supine on foam roll to stretch surgical incision   Date: 12/25/2022 Nustep level 6 (green machine) x5 min with PT present to discuss status 6 minute walk 1,526 ft without increased pain Single leg stance on each leg Standing overhead squat press with 8# in each hand 2x10 Leg Press (seat at 6) 115# 2x10 Unilateral leg press (seat at 6) 55# 2x10 bilat Seated rows 40# 2x10 Standing lat  pull-down 40# 2x10 4 way resisted gait with 10# cable pulley x5 each Standing L counter stretch 3x20 sec Lumbar Traction: Intermittent 78# max, 35# min, hold 60 sec, rest 15 sec. x15 minutes   PATIENT EDUCATION: Education details: POC, progress towards goals, benefits of continued therapy to continue to address low pain pain and weakness associated with lumbar findings Person educated: Patient Education method: Explanation Education comprehension: verbalized understanding   HOME EXERCISE PROGRAM: Access Code: [email or web address] URL: [email or web address] Date: 11/25/2022 Prepared by: Tresa Endo  Program Notes Seated heel digs:  Sit at edge of computer (rolling) chair:  dig your heels and pull forward/push back.  Do NOT use your back for pulling forward/pushing back.  Exercises - Sit to Stand  - 1 x daily - 5 x weekly - 5 reps - Seated Long Arc Quad  - 1 x daily - 7 x weekly - 2 sets - 10 reps - Seated Anterior Pelvic Tilt  - 1-2 x daily - 7 x weekly - 10 reps - Standing Lumbar Extension  - 1-2 x daily - 7 x weekly - 10 reps - Wall Quarter Squat  - 1 x daily - 7 x weekly - 10 reps - Standing Gastroc Stretch at Counter  - 1 x daily - 7 x weekly - 1 sets - 3 reps - 30 sec hold - Prone Knee Flexion  - 1 x daily - 7 x weekly - 2 sets - 10 reps - Prone Hip Extension  - 1 x daily - 7 x weekly - 2 sets - 10 reps - Supine Transversus Abdominis Bracing - Hands on Thighs  - 1 x daily -  7 x weekly - 2 sets - 10 reps - Seated Calf Stretch with Strap  - 1 x daily - 5 x weekly - 2 sets - 5 reps - Romberg Stance with Eyes Closed  - 1 x daily - 5 x weekly - 2 sets - 30 sec hold - Narrow Stance with Counter Support  - 1 x daily - 5 x weekly - 2 sets - 30 sec hold - Standing Anti-Rotation Press with Anchored Resistance  - 2 x daily - 7 x weekly - 2 sets - 10 reps   GOALS: Goals reviewed with patient? Yes   Updated GOALS per 12/02/22 RECERT:  SHORT TERM GOALS: Target date: 11/06/2022  Pt will  be independent with HEP for improved strength, balance, decreased pain. Baseline: Goal status: MET  LONG TERM GOALS: Target date:01/16/23  Pt will be independent with final progression of HEP for improved strength, balance, transfers, and gait. Baseline:  Goal status: IN PROGRESS  2.  Pt will improve DGI score to at least 20/24 to decrease fall risk.  Baseline:  17/24  Goal Status:  In progress   3.  Pt will improve Lt ankle strength to 3-/5 and Rt ankle strength to 2/5 for improved functional mobility. Baseline: see above (12/02/22) Goal status: IN PROGRESS  4.  Pt will report improved pain in low back by 50% for improved functional mobility. Baseline: no change in symptoms (12/02/22) Goal status: MET on 12/25/2022  5.  Pt will ambulate community distances and surfaces with AFOs and no device, independently for improved community mobility. Baseline: wearing bil AFOs and not cane  Goal status: MET  6. Improve 6 min walk test to 1900 feet   Baseline: 1526 ft (12/25/22)  Goal status: Ongoing (see above)   ASSESSMENT:  CLINICAL IMPRESSION: Pt continues to make strength, endurance and mobility gains.  He denies any significant LBP x 5-6 days. He was able to increase weights with exercise today. Patient is progressing with overall improved balance. He negotiated hurdles with step-over-step gait and no UE support.  Pt will have assessment of his AFOs soon due to pain with wear.  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  Patient will benefit from skilled PT to address the below impairments and improve overall function.   OBJECTIVE IMPAIRMENTS: Abnormal gait, decreased balance, decreased mobility, difficulty walking, decreased ROM, decreased strength, impaired flexibility, and impaired sensation.   ACTIVITY LIMITATIONS: carrying, lifting, bending, standing, stairs, transfers, and locomotion  level  PARTICIPATION LIMITATIONS: driving, community activity, and occupation  PERSONAL FACTORS: 3+ comorbidities: see above; recent abdominal surgery 06/2022  are also affecting patient's functional outcome.   REHAB POTENTIAL: Good  CLINICAL DECISION MAKING: Evolving/moderate complexity  EVALUATION COMPLEXITY: Moderate  PLAN:  PT FREQUENCY: 2x/week  PT DURATION: 6 weeks  PLANNED INTERVENTIONS: Therapeutic exercises, Therapeutic activity, Neuromuscular re-education, Balance training, Gait training, Patient/Family education, Self Care, Joint mobilization, Dry Needling, Electrical stimulation, Traction, and Manual therapy  PLAN FOR NEXT SESSION:   Balance and strength progression. Dynamic Gait Index (DGI).   Gabriel Dalton, PT 01/01/23 8:55 AM    [facility] [address], [address] [place], [place] [address] Phone # [phone] Fax [phone]  Fax # [phone]

## 2023-01-06 ENCOUNTER — Ambulatory Visit (INDEPENDENT_AMBULATORY_CARE_PROVIDER_SITE_OTHER): Payer: Self-pay | Admitting: Internal Medicine

## 2023-01-06 ENCOUNTER — Other Ambulatory Visit: Payer: Self-pay

## 2023-01-06 ENCOUNTER — Ambulatory Visit: Payer: 59

## 2023-01-06 ENCOUNTER — Encounter: Payer: Self-pay | Admitting: Internal Medicine

## 2023-01-06 VITALS — BP 117/88 | HR 83 | Temp 98.3°F | Resp 28 | Ht 68.0 in | Wt 121.4 lb

## 2023-01-06 DIAGNOSIS — F17210 Nicotine dependence, cigarettes, uncomplicated: Secondary | ICD-10-CM

## 2023-01-06 DIAGNOSIS — R252 Cramp and spasm: Secondary | ICD-10-CM

## 2023-01-06 DIAGNOSIS — R2681 Unsteadiness on feet: Secondary | ICD-10-CM

## 2023-01-06 DIAGNOSIS — I700 Atherosclerosis of aorta: Secondary | ICD-10-CM

## 2023-01-06 DIAGNOSIS — M4716 Other spondylosis with myelopathy, lumbar region: Secondary | ICD-10-CM

## 2023-01-06 DIAGNOSIS — M6281 Muscle weakness (generalized): Secondary | ICD-10-CM

## 2023-01-06 DIAGNOSIS — M5459 Other low back pain: Secondary | ICD-10-CM

## 2023-01-06 DIAGNOSIS — K631 Perforation of intestine (nontraumatic): Secondary | ICD-10-CM

## 2023-01-06 DIAGNOSIS — R262 Difficulty in walking, not elsewhere classified: Secondary | ICD-10-CM

## 2023-01-06 MED ORDER — DULOXETINE HCL 30 MG PO CPEP: 30.0000 mg | ORAL_CAPSULE | Freq: Every day | ORAL | 2 refills | Status: DC

## 2023-01-06 MED ORDER — VARENICLINE TARTRATE 0.5 MG PO TABS
0.5000 mg | ORAL_TABLET | Freq: Every day | ORAL | 2 refills | Status: DC
Start: 2023-01-06 — End: 2023-11-10

## 2023-01-06 NOTE — Assessment & Plan Note (Signed)
Patient reports postprandial pain after eating large meals, otherwise minimal abdominal pain. Followed by surgery, has follow-up clinic appointment with general surgery next month. Has been taking daily Miralax, reports a stool every day/every other day. He has been slowly gaining weight after discharge in March, at the time he weighed 85 pounds. Reports he was taking Ensure shakes but they became too expensive for him.   -Provided Ensure shakes today -Continue following with gen surg

## 2023-01-06 NOTE — Assessment & Plan Note (Signed)
Patient has had low back pain with progressive bilateral radicular symptoms, weakness, numbness, bilateral foot drop. MRI from several months ago showed spondylosis and mild disc protrusion at S1 nerve root. Gabapentin 100 TID has become decreasingly effective for his pain. He attends physical therapy twice per week which is moderately helpful. He also received an epidural steroid injection with IR, which was not helpful.   -Referral to neurosurgery clinic -working on long-term disability paperwork, may require physical therapist to complete recommendations

## 2023-01-06 NOTE — Progress Notes (Unsigned)
Subjective:  CC: back pain, disability paperwork  HPI:  [name] is a 37 y.o. male with a past medical history stated below and presents today for above. Please see problem based assessment and plan for additional details.  Past Medical History:  Diagnosis Date   Abdominal fluid collection 07/11/2022   BPH (benign prostatic hyperplasia)    Bronchitis    Melena 04/25/2019   Multiple gastric ulcers    Peritonitis (HCC) 06/26/2022    Current Outpatient Medications on File Prior to Visit  Medication Sig Dispense Refill   acetaminophen (TYLENOL) 500 MG tablet Take 2 tablets (1,000 mg total) by mouth every 6 (six) hours. 30 tablet 0   dicyclomine (BENTYL) 20 MG tablet TAKE 1 TABLET (20 MG TOTAL) BY MOUTH 4 (FOUR) TIMES DAILY - BEFORE MEALS AND AT BEDTIME. 360 tablet 1   feeding supplement (ENSURE ENLIVE / ENSURE PLUS) LIQD Take 237 mLs by mouth 3 (three) times daily between meals. 237 mL 12   folic acid (FOLVITE) 1 MG tablet Take 1 tablet (1 mg total) by mouth daily.     gabapentin (NEURONTIN) 400 MG capsule Take 1 capsule (400 mg total) by mouth 3 (three) times daily. Take 300 mg daily at bedtime for 1 week, then 300 mg three times a day 90 capsule 3   leptospermum manuka honey (MEDIHONEY) PSTE paste Apply 1 Application topically daily. (Patient not taking: Reported on 09/11/2022) 44 mL 1   methocarbamol (ROBAXIN) 500 MG tablet Take 3 tablets (1,500 mg total) by mouth 3 (three) times daily. 90 tablet 1   ondansetron (ZOFRAN) 4 MG tablet Take 1 tablet (4 mg total) by mouth every 8 (eight) hours as needed for nausea or vomiting. (Patient not taking: Reported on 09/11/2022) 20 tablet 0   pantoprazole (PROTONIX) 40 MG tablet Take 1 tablet (40 mg total) by mouth daily. 30 tablet 2   simethicone (MYLICON) 80 MG chewable tablet Chew 1 tablet (80 mg total) by mouth 4 (four) times daily as needed for flatulence. 30 tablet 0   tamsulosin (FLOMAX) 0.4 MG CAPS capsule Take 0.4 mg  by mouth daily.     thiamine (VITAMIN B1) 100 MG tablet Take 1 tablet (100 mg total) by mouth daily. 90 tablet 1   Vitamin D, Ergocalciferol, (DRISDOL) 1.25 MG (50000 UNIT) CAPS capsule Take 1 capsule (50,000 Units total) by mouth every 7 (seven) days. (Patient not taking: Reported on 09/11/2022) 8 capsule 0   No current facility-administered medications on file prior to visit.    Review of Systems: ROS negative except for as is noted on the assessment and plan.  Objective:   Vitals:   01/06/23 1037  BP: 117/88  Pulse: 83  Resp: (!) 28  Temp: 98.3 F (36.8 C)  TempSrc: Oral  SpO2: (!) 79%  Weight: 121 lb 6.4 oz (55.1 kg)  Height: 5\' 8"  (1.727 m)    Physical Exam: Constitutional: well-appearing, in no acute distress HENT: normocephalic atraumatic, mucous membranes moist Eyes: conjunctiva non-erythematous Neck: supple Cardiovascular: regular rate and rhythm, no m/r/g Pulmonary/Chest: normal work of breathing on room air, lungs clear to auscultation bilaterally Abdominal: soft, non-tender, non-distended MSK: normal bulk and tone Neurological: alert & oriented x 3, 5/5 strength in bilateral upper and lower extremities, normal gait Skin: warm and dry  Assessment & Plan:   Aortic calcification (HCC) Lipid levels were checked at last visit showing LDL154, total cholesterol 226. Given low total ASCVD we will hold off on starting statin  therapy at this time.   Small bowel perforation (HCC) Patient reports postprandial pain after eating large meals, otherwise minimal abdominal pain. Followed by surgery, has follow-up clinic appointment with general surgery next month. Has been taking daily Miralax, reports a stool every day/every other day. He has been slowly gaining weight after discharge in March, at the time he weighed 85 pounds. Reports he was taking Ensure shakes but they became too expensive for him.   -Provided Ensure shakes today -Continue following with gen surg  Lumbar  spondylosis with myelopathy Patient has had low back pain with progressive bilateral radicular symptoms, weakness, numbness, bilateral foot drop. MRI from several months ago showed spondylosis and mild disc protrusion at S1 nerve root. Gabapentin 100 TID has become decreasingly effective for his pain. He attends physical therapy twice per week which is moderately helpful. He also received an epidural steroid injection with IR, which was not helpful.   -Referral to neurosurgery clinic -working on long-term disability paperwork, may require physical therapist to complete recommendations   Patient seen with Dr. Pershing Proud MD [facility]  PGY-1 Pager: [phone] Date 01/06/2023  Time 1:06 PM

## 2023-01-06 NOTE — Assessment & Plan Note (Signed)
Lipid levels were checked at last visit showing LDL154, total cholesterol 226. Given low total ASCVD we will hold off on starting statin therapy at this time.

## 2023-01-06 NOTE — Therapy (Addendum)
OUTPATIENT PHYSICAL THERAPY TREATMENT   Patient Name: Gabriel Dalton MRN: [id number] DOB:[birth year], 37 y.o., male Today's Date: 01/06/2023   PCP: Steffanie Rainwater, MD/to transition to Dr. Berkley Harvey PROVIDER: Gust Rung, DO   END OF SESSION:  PT End of Session - 01/06/23 0931     Visit Number 23    Date for PT Re-Evaluation 01/16/23    Authorization Type UHC- pt notified on 01/06/23 that his insurance policy is terminated    Authorization - Number of Visits 50    PT Start Time 0853    PT Stop Time 0932    PT Time Calculation (min) 39 min    Activity Tolerance Patient tolerated treatment well    Behavior During Therapy [facility] for tasks assessed/performed                        Past Medical History:  Diagnosis Date   Abdominal fluid collection 07/11/2022   BPH (benign prostatic hyperplasia)    Bronchitis    Melena 04/25/2019   Multiple gastric ulcers    Peritonitis (HCC) 06/26/2022   Past Surgical History:  Procedure Laterality Date   CHOLECYSTECTOMY N/A 06/25/2022   Procedure: LAPAROSCOPIC CHOLECYSTECTOMY;  Surgeon: Quentin Ore, MD;  Location: [facility];  Service: General;  Laterality: N/A;   COLONOSCOPY     I & D EXTREMITY  10/18/2011   Procedure: IRRIGATION AND DEBRIDEMENT EXTREMITY;  Surgeon: Dominica Severin, MD;  Location: [facility] OR;  Service: Orthopedics;  Laterality: Right;  Irrigation and Debridement  Right Middle Finger; Removal of foreign body   LAPAROTOMY N/A 06/27/2022   Procedure: EXPLORATORY LAPAROTOMY, WASH OUT,  ABDOMINAL CLOSURE;  Surgeon: Quentin Ore, MD;  Location: [facility];  Service: General;  Laterality: N/A;   Patient Active Problem List   Diagnosis Date Noted   Aortic calcification (HCC) 10/22/2022   Lumbar spondylosis with myelopathy 09/03/2022   Heart murmur 09/03/2022   Vitamin D deficiency 08/01/2022   Thiamine deficiency 08/01/2022   Electrolyte abnormality 07/09/2022   Benign prostatic  hyperplasia 07/09/2022   Severe protein-calorie malnutrition (HCC) 06/25/2022   Small bowel perforation (HCC) 06/25/2022   Cholelithiasis 06/24/2022   Early satiety 05/27/2022   Nausea and vomiting 05/27/2022   Esophagitis determined by endoscopy 04/23/2022   Abnormal colonoscopy 12/26/2021   Gastroesophageal reflux disease 12/26/2021   Normocytic anemia 04/25/2019   Gastric ulcers 04/25/2019   Generalized abdominal pain 04/01/2019    ONSET DATE: 09/03/2022 (MD referral); abdominal surgery 06/2022  REFERRING DIAG:  M47.816 (ICD-10-CM) - Lumbar spondylosis  M47.16 (ICD-10-CM) - Lumbar spondylosis with myelopathy    THERAPY DIAG:  Unsteadiness on feet  Muscle weakness (generalized)  Cramp and spasm  Difficulty in walking, not elsewhere classified  Other low back pain  Rationale for Evaluation and Treatment: Rehabilitation  SUBJECTIVE:  SUBJECTIVE STATEMENT: I am getting stronger.  I took a [facility] at [facility] this weekend and it went well.      Pt accompanied by: self  PERTINENT HISTORY:  Underwent exploratory laparotomy with small bowel resection with temporary abdominal closure on 06/25/22, and washout with abdominal closure on 06/27/22.  Patient goes to [facility] facility on 10/24/2022 to be fitted for AFO  PAIN:  Are you having pain? Yes: NPRS scale: 0/10 Pain location: right knee Pain description: tight feeling Aggravating factors: eating Relieving factors: bowel movement   PRECAUTIONS: Other: Lifting restrictions  WEIGHT BEARING RESTRICTIONS: No  FALLS: Has patient fallen in last 6 months? Yes. Number of falls 1  LIVING ENVIRONMENT: Lives with: lives with their spouse Lives in: House/apartment Stairs: Yes: External: 6 steps; on right going up and on left going up Has  following equipment at home: Single point cane  PLOF: Independent and Vocation/Vocational requirements: truck driver  PATIENT GOALS: To get strength, balance, stability back  OBJECTIVE:   Below measures were taken at time of initial evaluation unless otherwise specified:   DIAGNOSTIC FINDINGS:  See MRI:  At L5-S1, there is mild disc degeneration. Small left subarticular disc protrusion (at site of posterior annular fissure). The disc protrusion results in slight left subarticular narrowing, and contacts the descending left S1 nerve root. No significant central canal stenosis.  COGNITION: Overall cognitive status: Within functional limits for tasks assessed   SENSATION: Light touch: Impaired   COORDINATION: Slightly slowed heel to shin  EDEMA:  Feet swell sometimes  POSTURE: No Significant postural limitations  LOWER EXTREMITY ROM:     Passive  Right Eval Left Eval Right 10/23/2022 Left 10/23/2022  Hip flexion      Hip extension      Hip abduction      Hip adduction      Hip internal rotation      Hip external rotation      Knee flexion      Knee extension      Ankle dorsiflexion 12 12 18 18   Ankle plantarflexion      Ankle inversion      Ankle eversion       (Blank rows = not tested)  LOWER EXTREMITY MMT:    MMT Right Eval Left Eval Right 10/23/2022 Right  12/02/22 Left 10/23/2022 Lt  12/02/22  Hip flexion 4 4 4+ 4+ 4+ 4+  Hip extension        Hip abduction        Hip adduction        Hip internal rotation        Hip external rotation        Knee flexion 4 3+ 4 4+ 4 4+  Knee extension 4+ 4+ 4+ 5 4+ 5  Ankle dorsiflexion 1 1 1 1 2 2   Ankle plantarflexion 3- 3-      Ankle inversion 3- 3-   3 3  Ankle eversion 3- 3-   3- 3-  (Blank rows = not tested)   TRANSFERS: Assistive device utilized: None  Sit to stand: Modified independence Stand to sit: Modified independence  GAIT: Gait pattern: step through pattern, decreased step length- Right,  decreased step length- Left, decreased ankle dorsiflexion- Right, decreased ankle dorsiflexion- Left, Right steppage, and Left steppage Distance walked: 50 ft x 2 Assistive device utilized: Single point cane Level of assistance: Modified independence  FUNCTIONAL TESTS:  Eval: 5 times sit to stand: 19.97 sec arms crossed at chest Timed  up and go (TUG): 17.62 10 meter walk test: 13.25 sec cane (2.48 ft/sec) DGI :  12/24 SLS:  RLE 4 sec, LLE 1 sec  09/30/2022: 5 times sit to stand: 9.64 sec arms crossed at chest  6.11.24: 5x sit to stand: 10 seconds   12/02/22: 6 min walk test: 1383 feet (age related norms 2379)  12/25/2022: 6 min walk test:  1526 ft Single Leg Stance:  Right- 11.77 sec, Left 10.85 sec      TODAY'S TREATMENT: Date: 01/06/2023 Nustep level 6 (new model) x8 min with PT present to discuss status Forward lunge to Bosu using min UE on poles 2x10 bil each Standing overhead squat press with 8# in each hand 1x10 then 9# 1x10 Seated rows 40# 2x10 4 way resisted gait with 15# cable pulley x5 each Hurdles: step over step gait forward with cobble blocks between, step to sidestepping without UE support Sidestepping along airex balance pad without UE support  Date: 01/01/2023 Nustep level 7 (old model) x8 min with PT present to discuss status Forward lunge to Bosu using min UE on poles 2x10 bil each Standing overhead squat press with 8# in each hand 1x10 then 9# 1x10 Leg Press (seat at 6) 115# 2x10 Unilateral leg press (seat at 6) 55# 2x10 bilat Seated rows 40# 2x10 Hurdles: step over step gait forward, step to sidestepping without UE support Sidestepping with blue loop around thighs   Date: 12/30/2022 Nustep level 5 (old model) x8 min with PT present to discuss status Forward lunge to Bosu using min UE on poles 2x10 bil each Standing overhead squat press with 8# in each hand 1x10 then 9# 1x10 Leg Press (seat at 6) 115# 2x10 Unilateral leg press (seat at 6) 55# 2x10  bilat Seated rows 40# 2x10 Standing lat pull-down 40# 2x10 4 way resisted gait with 15# cable pulley x5 each Sidestepping with blue loop around thighs  Supine on foam roll to stretch surgical incision   PATIENT EDUCATION: Education details: POC, progress towards goals, benefits of continued therapy to continue to address low pain pain and weakness associated with lumbar findings Person educated: Patient Education method: Explanation Education comprehension: verbalized understanding   HOME EXERCISE PROGRAM: Access Code: [email or web address] URL: [email or web address] Date: 11/25/2022 Prepared by: Tresa Endo  Program Notes Seated heel digs:  Sit at edge of computer (rolling) chair:  dig your heels and pull forward/push back.  Do NOT use your back for pulling forward/pushing back.  Exercises - Sit to Stand  - 1 x daily - 5 x weekly - 5 reps - Seated Long Arc Quad  - 1 x daily - 7 x weekly - 2 sets - 10 reps - Seated Anterior Pelvic Tilt  - 1-2 x daily - 7 x weekly - 10 reps - Standing Lumbar Extension  - 1-2 x daily - 7 x weekly - 10 reps - Wall Quarter Squat  - 1 x daily - 7 x weekly - 10 reps - Standing Gastroc Stretch at Counter  - 1 x daily - 7 x weekly - 1 sets - 3 reps - 30 sec hold - Prone Knee Flexion  - 1 x daily - 7 x weekly - 2 sets - 10 reps - Prone Hip Extension  - 1 x daily - 7 x weekly - 2 sets - 10 reps - Supine Transversus Abdominis Bracing - Hands on Thighs  - 1 x daily - 7 x weekly - 2 sets - 10 reps - Seated Calf Stretch with Strap  -  1 x daily - 5 x weekly - 2 sets - 5 reps - Romberg Stance with Eyes Closed  - 1 x daily - 5 x weekly - 2 sets - 30 sec hold - Narrow Stance with Counter Support  - 1 x daily - 5 x weekly - 2 sets - 30 sec hold - Standing Anti-Rotation Press with Anchored Resistance  - 2 x daily - 7 x weekly - 2 sets - 10 reps   GOALS: Goals reviewed with patient? Yes   Updated GOALS per 12/02/22 RECERT:  SHORT TERM GOALS: Target date:  11/06/2022  Pt will be independent with HEP for improved strength, balance, decreased pain. Baseline: Goal status: MET  LONG TERM GOALS: Target date:01/16/23  Pt will be independent with final progression of HEP for improved strength, balance, transfers, and gait. Baseline:  Goal status: IN PROGRESS  2.  Pt will improve DGI score to at least 20/24 to decrease fall risk.  Baseline:  17/24  Goal Status:  In progress   3.  Pt will improve Lt ankle strength to 3-/5 and Rt ankle strength to 2/5 for improved functional mobility. Baseline: see above (12/02/22) Goal status: IN PROGRESS  4.  Pt will report improved pain in low back by 50% for improved functional mobility. Baseline: no change in symptoms (12/02/22) Goal status: MET on 12/25/2022  5.  Pt will ambulate community distances and surfaces with AFOs and no device, independently for improved community mobility. Baseline: wearing bil AFOs and not cane  Goal status: MET  6. Improve 6 min walk test to 1900 feet   Baseline: 1526 ft (12/25/22)  Goal status: Ongoing (see above)   ASSESSMENT:  CLINICAL IMPRESSION: Pt continues to make strength, endurance and mobility gains.  His back pain has been reduced overall, however he still has intermittent pain that is significant.  Patient is progressing with overall improved balance and less guarding/supervision required by PT for safety. He negotiated hurdles with step-over-step gait and no UE support including cobble blocks without UE support.  Pt will have assessment of his AFOs soon due to pain with wear.  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  Patient will benefit from skilled PT to address the below impairments and improve overall function.   OBJECTIVE IMPAIRMENTS: Abnormal gait, decreased balance, decreased mobility, difficulty walking, decreased ROM, decreased strength, impaired  flexibility, and impaired sensation.   ACTIVITY LIMITATIONS: carrying, lifting, bending, standing, stairs, transfers, and locomotion level  PARTICIPATION LIMITATIONS: driving, community activity, and occupation  PERSONAL FACTORS: 3+ comorbidities: see above; recent abdominal surgery 06/2022  are also affecting patient's functional outcome.   REHAB POTENTIAL: Good  CLINICAL DECISION MAKING: Evolving/moderate complexity  EVALUATION COMPLEXITY: Moderate  PLAN:  PT FREQUENCY: 2x/week  PT DURATION: 6 weeks  PLANNED INTERVENTIONS: Therapeutic exercises, Therapeutic activity, Neuromuscular re-education, Balance training, Gait training, Patient/Family education, Self Care, Joint mobilization, Dry Needling, Electrical stimulation, Traction, and Manual therapy  PLAN FOR NEXT SESSION:   Balance and strength progression. Check goals.   Lorrene Reid, PT 01/06/23 9:34 AM   PHYSICAL THERAPY DISCHARGE SUMMARY  Visits from Start of Care: 23  Current functional level related to goals / functional outcomes: See above for most recent status.  Pt didn't return to PT.     Remaining deficits: See above    Education / Equipment: HEP, gait   Patient agrees to discharge. Patient goals were  partially met. Patient is being discharged due to not returning since the last visit.  Lorrene Reid, PT 06/11/23 9:29 AM   [facility] [address], [address] [place], [place] [address] Phone # [phone] Fax [phone]  Fax # [phone]

## 2023-01-07 ENCOUNTER — Telehealth: Payer: Self-pay

## 2023-01-07 NOTE — Telephone Encounter (Signed)
Questions about getting genetic testing. Please call pt back.

## 2023-01-07 NOTE — Progress Notes (Signed)
Internal Medicine Clinic Attending  I was physically present during the key portions of the resident provided service and participated in the medical decision making of patient's management care. I reviewed pertinent patient test results.  The assessment, diagnosis, and plan were formulated together and I agree with the documentation in the resident's note.  Narendra, Nischal, MD  

## 2023-01-08 ENCOUNTER — Ambulatory Visit: Payer: 59

## 2023-01-13 ENCOUNTER — Ambulatory Visit: Payer: Self-pay | Attending: Internal Medicine

## 2023-01-13 ENCOUNTER — Telehealth: Payer: Self-pay

## 2023-01-13 DIAGNOSIS — R252 Cramp and spasm: Secondary | ICD-10-CM | POA: Insufficient documentation

## 2023-01-13 DIAGNOSIS — M6281 Muscle weakness (generalized): Secondary | ICD-10-CM | POA: Insufficient documentation

## 2023-01-13 DIAGNOSIS — M5459 Other low back pain: Secondary | ICD-10-CM | POA: Insufficient documentation

## 2023-01-13 DIAGNOSIS — R262 Difficulty in walking, not elsewhere classified: Secondary | ICD-10-CM | POA: Insufficient documentation

## 2023-01-13 DIAGNOSIS — R2681 Unsteadiness on feet: Secondary | ICD-10-CM | POA: Insufficient documentation

## 2023-01-13 NOTE — Telephone Encounter (Signed)
PT called pt due to no-show appt.  Pt will likely need to cancel appts until insurance is determined.

## 2023-01-15 ENCOUNTER — Ambulatory Visit: Payer: Self-pay

## 2023-01-22 ENCOUNTER — Ambulatory Visit: Payer: Self-pay | Admitting: Rehabilitative and Restorative Service Providers"

## 2023-01-29 ENCOUNTER — Ambulatory Visit: Payer: Self-pay | Admitting: Rehabilitative and Restorative Service Providers"

## 2023-02-24 ENCOUNTER — Ambulatory Visit: Payer: Self-pay

## 2023-06-22 ENCOUNTER — Encounter: Payer: Self-pay | Admitting: Student

## 2023-06-22 ENCOUNTER — Ambulatory Visit (INDEPENDENT_AMBULATORY_CARE_PROVIDER_SITE_OTHER): Payer: Self-pay | Admitting: Student

## 2023-06-22 ENCOUNTER — Other Ambulatory Visit: Payer: Self-pay | Admitting: Internal Medicine

## 2023-06-22 VITALS — BP 104/66 | HR 79 | Temp 97.9°F | Ht 68.0 in | Wt 126.6 lb

## 2023-06-22 DIAGNOSIS — R1084 Generalized abdominal pain: Secondary | ICD-10-CM

## 2023-06-22 DIAGNOSIS — N400 Enlarged prostate without lower urinary tract symptoms: Secondary | ICD-10-CM

## 2023-06-22 DIAGNOSIS — M4716 Other spondylosis with myelopathy, lumbar region: Secondary | ICD-10-CM

## 2023-06-22 NOTE — Patient Instructions (Signed)
 Thank you, [name] for allowing us  to provide your care today.   Today we discussed :  Getting Medicaid insurance, so we can refer you to a specialist including neurosurgery, urology, and gastroenterology.  2.  Please also go to the [facility] and get a disability placard form, I will be happy to fill it for you.  3.  For your history of BPH, I will be happy to refer you to urologist once Medicaid gets approved.    I have ordered the following labs for you:   Lab Orders         PSA         CBC      Tests ordered today:  None   Referrals ordered today:   Referral Orders  No referral(s) requested today     I have ordered the following medication/changed the following medications:   Stop the following medications: There are no discontinued medications.   Start the following medications: No orders of the defined types were placed in this encounter.    Follow up: 3 months    Remember: Should you have any questions or concerns please call the internal medicine clinic at [phone].     Marni Sins, MD  [facility]

## 2023-06-22 NOTE — Assessment & Plan Note (Signed)
 He reports intermittent urinary hesitancy, and weak stream. Not on any meds, previously was on Flomax.  He denies hematuria. History of prostate cancer in his brother and uncle.  He was previously seen by urology, but was unable to follow-up in 06/2022 due to his abdominal surgery.  Would hold off on referral to urology since he does not have an active insurance.  He says his insurance should be active soon, has applied for Medicaid. Could consider starting him Flomax if symptoms still bothersome..  - Will check PSA - Follow-up on Medicaid application at next OV, if active refer to Urology

## 2023-06-22 NOTE — Assessment & Plan Note (Signed)
 He has sharp lower abdominal pain and bloating after eating.  No nausea, vomiting or heartburn sensation.  He has tried  Protonix  and simethicone  without any significant improvement, so medicine was discontinued. He has decreased appetite, has gained 7 lbs since LOV. Unable to afford Ensure due to cost.  On exam today, abdomen is soft and nontender. I think his postprandial abdominal pain is likely secondary to perhaps nerve damage during surgery.  He has a follow-up appointment with general surgery in about 1-2 weeks time. EGD in 2023 was positive for gastritis, and small hiatal hernia, but negative for H. pylori. I also considered gastroparesis, could consider further workup with emptying study if symptoms are still persistent, and referral to GI.  - Follow-up with general surgery. - Could consider referral to GI if insurance is active.

## 2023-06-22 NOTE — Progress Notes (Addendum)
 CC: Needing referral back to urology, and follow-up on chronic conditions.  HPI:  Gabriel Dalton is a 38 y.o. male living with a history stated below and presents today for referral to urology and follow-up on chronic medical conditions.  Please see problem based assessment and plan for additional details.  Last [facility] OV 01/06/23.  Past Medical History:  Diagnosis Date   Abdominal fluid collection 07/11/2022   BPH (benign prostatic hyperplasia)    Bronchitis    Melena 04/25/2019   Multiple gastric ulcers    Peritonitis (HCC) 06/26/2022    Current Outpatient Medications on File Prior to Visit  Medication Sig Dispense Refill   acetaminophen  (TYLENOL ) 500 MG tablet Take 2 tablets (1,000 mg total) by mouth every 6 (six) hours. 30 tablet 0   dicyclomine  (BENTYL ) 20 MG tablet TAKE 1 TABLET (20 MG TOTAL) BY MOUTH 4 (FOUR) TIMES DAILY - BEFORE MEALS AND AT BEDTIME. 360 tablet 1   DULoxetine  (CYMBALTA ) 30 MG capsule Take 1 capsule (30 mg total) by mouth daily. 30 capsule 2   feeding supplement (ENSURE ENLIVE / ENSURE PLUS) LIQD Take 237 mLs by mouth 3 (three) times daily between meals. 237 mL 12   folic acid  (FOLVITE ) 1 MG tablet Take 1 tablet (1 mg total) by mouth daily.     gabapentin  (NEURONTIN ) 400 MG capsule Take 1 capsule (400 mg total) by mouth 3 (three) times daily. Take 300 mg daily at bedtime for 1 week, then 300 mg three times a day 90 capsule 3   leptospermum manuka honey (MEDIHONEY) PSTE paste Apply 1 Application topically daily. (Patient not taking: Reported on 09/11/2022) 44 mL 1   methocarbamol  (ROBAXIN ) 500 MG tablet Take 3 tablets (1,500 mg total) by mouth 3 (three) times daily. 90 tablet 1   ondansetron  (ZOFRAN ) 4 MG tablet Take 1 tablet (4 mg total) by mouth every 8 (eight) hours as needed for nausea or vomiting. (Patient not taking: Reported on 09/11/2022) 20 tablet 0   pantoprazole  (PROTONIX ) 40 MG tablet Take 1 tablet (40 mg total) by mouth daily. 30 tablet 2    simethicone  (MYLICON) 80 MG chewable tablet Chew 1 tablet (80 mg total) by mouth 4 (four) times daily as needed for flatulence. 30 tablet 0   tamsulosin (FLOMAX) 0.4 MG CAPS capsule Take 0.4 mg by mouth daily.     thiamine  (VITAMIN B1) 100 MG tablet Take 1 tablet (100 mg total) by mouth daily. 90 tablet 1   varenicline  (CHANTIX ) 0.5 MG tablet Take 1 tablet (0.5 mg total) by mouth daily. 30 tablet 2   No current facility-administered medications on file prior to visit.    Review of Systems: ROS negative except for what is noted on the assessment and plan.  Vitals:   06/22/23 1446  BP: 104/66  Pulse: 79  Temp: 97.9 F (36.6 C)  TempSrc: Oral  SpO2: 100%  Weight: 126 lb 9.6 oz (57.4 kg)  Height: 5\' 8"  (1.727 m)    Physical Exam:  Constitutional:Not in acute distress. Cardiovascular: regular rate and rhythm, no m/r/g Pulmonary/Chest: normal work of breathing on room air, lungs clear to auscultation bilaterally Abdominal: soft, midline scar, nontender with normal bowel sounds. MSK:  Negative straight leg test, 4/5 strength on the RLE, 3/5 strength LLE   Assessment & Plan:   Benign prostatic hyperplasia He reports intermittent urinary hesitancy, and weak stream. Not on any meds, previously was on Flomax.  He denies hematuria. History of prostate cancer in his brother  and uncle.  He was previously seen by urology, but was unable to follow-up in 06/2022 due to his abdominal surgery.  Would hold off on referral to urology since he does not have an active insurance.  He says his insurance should be active soon, has applied for Medicaid. Could consider starting him Flomax if symptoms still bothersome..  - Will check PSA - Follow-up on Medicaid application at next OV, if active refer to Urology  Generalized abdominal pain He has sharp lower abdominal pain and bloating after eating.  No nausea, vomiting or heartburn sensation.  He has tried  Protonix  and simethicone  without any  significant improvement, so medicine was discontinued. He has decreased appetite, has gained 7 lbs since LOV. Unable to afford Ensure due to cost.  On exam today, abdomen is soft and nontender. I think his postprandial abdominal pain is likely secondary to perhaps nerve damage during surgery.  He has a follow-up appointment with general surgery in about 1-2 weeks time. EGD in 2023 was positive for gastritis, and small hiatal hernia, but negative for H. pylori. I also considered gastroparesis, could consider further workup with emptying study if symptoms are still persistent, and referral to GI.  - Follow-up with general surgery. - Could consider referral to GI if insurance is active.  Lumbar spondylosis with myelopathy Back pain, numbness and tingling sensation mildly improved.  MRI from several months ago showed spondylosis and mild disc protrusion at S1 nerve root. He was unable to follow-up with neurosurgery and physical therapy due to not having active insurance.  He has completed a long-term disability paperwork, he said he lost his job shortly thereafter. He has been doing home exercises, and also using gabapentin  for pain. On exam today, straight leg test is negative,right foot drop> Left.  He had concerns about disability parking, advised pt he needs to pick up the form from [facility].     -Referral to neurosurgery clinic, once insurance is active. -Follow-up on disability placard from [facility].  Vitamin D  deficiency - Will check 25-hydroxy vitamin D . . Patient seen with Dr. Bernetta Brilliant, MD [facility], PGY-1 Pager: [phone] Date 06/22/2023 Time 8:41 PM

## 2023-06-22 NOTE — Assessment & Plan Note (Addendum)
 Back pain, numbness and tingling sensation mildly improved.  MRI from several months ago showed spondylosis and mild disc protrusion at S1 nerve root. He was unable to follow-up with neurosurgery and physical therapy due to not having active insurance.  He has completed a long-term disability paperwork, he said he lost his job shortly thereafter. He has been doing home exercises, and also using gabapentin  for pain. On exam today, straight leg test is negative,right foot drop> Left.  He had concerns about disability parking, advised pt he needs to pick up the form from [facility].     -Referral to neurosurgery clinic, once insurance is active. -Follow-up on disability placard from [facility].

## 2023-06-23 LAB — CBC
Hematocrit: 43.2 % (ref 37.5–51.0)
Hemoglobin: 14.3 g/dL (ref 13.0–17.7)
MCH: 30.7 pg (ref 26.6–33.0)
MCHC: 33.1 g/dL (ref 31.5–35.7)
MCV: 93 fL (ref 79–97)
Platelets: 396 10*3/uL (ref 150–450)
RBC: 4.66 x10E6/uL (ref 4.14–5.80)
RDW: 14 % (ref 11.6–15.4)
WBC: 6.8 10*3/uL (ref 3.4–10.8)

## 2023-06-23 LAB — PSA: Prostate Specific Ag, Serum: 1 ng/mL (ref 0.0–4.0)

## 2023-07-02 ENCOUNTER — Other Ambulatory Visit: Payer: Self-pay | Admitting: Internal Medicine

## 2023-07-02 NOTE — Progress Notes (Signed)
Internal Medicine Clinic Attending  Case discussed with the resident at the time of the visit.  We reviewed the resident's history and exam and pertinent patient test results.  I agree with the assessment, diagnosis, and plan of care documented in the resident's note.  Chronic post-operative pain may be related to surgical adhesions; he'll be addressing this at his surgery f/u at the end of the month (a f/u CT is being considered).  On chart review a number of items will need f/u:  very high cholesterol should be added to problem list and treatment options discussed.  Vit D deficiency (level 9 one year ago) needs f/u - wasn't ordered today as intended.  A number of his medications are probably not current.

## 2023-07-13 ENCOUNTER — Other Ambulatory Visit: Payer: Self-pay | Admitting: Surgery

## 2023-07-13 DIAGNOSIS — K432 Incisional hernia without obstruction or gangrene: Secondary | ICD-10-CM

## 2023-07-13 DIAGNOSIS — K631 Perforation of intestine (nontraumatic): Secondary | ICD-10-CM

## 2023-08-05 ENCOUNTER — Ambulatory Visit
Admission: RE | Admit: 2023-08-05 | Discharge: 2023-08-05 | Disposition: A | Discharge status: Home / Self Care | Payer: Self-pay | Source: Ambulatory Visit | Attending: Surgery | Admitting: Surgery

## 2023-08-05 DIAGNOSIS — K631 Perforation of intestine (nontraumatic): Secondary | ICD-10-CM

## 2023-08-05 DIAGNOSIS — K432 Incisional hernia without obstruction or gangrene: Secondary | ICD-10-CM

## 2023-08-05 MED ORDER — IOPAMIDOL (ISOVUE-300) INJECTION 61%
75.0000 mL | Freq: Once | INTRAVENOUS | Status: AC | PRN
  Administered 2023-08-05: 75 mL via INTRAVENOUS

## 2023-08-19 ENCOUNTER — Telehealth: Payer: Self-pay

## 2023-08-19 NOTE — Telephone Encounter (Signed)
 I called and spoke with pt regarding paperwork from [employer] that I have received today. Pt states to disregard this paperwork, will have central [place] office to complete this form him. Pt states we did not put him out of work, so we cannot complete the forms. The forms were supposed to send it to central [place] office for them to complete. Pt will contact [employer] and have them send it to the right office.

## 2023-10-27 ENCOUNTER — Ambulatory Visit: Payer: Self-pay

## 2023-10-27 NOTE — Telephone Encounter (Signed)
 Copied from CRM [id number]. Topic: Clinical - Red Word Triage >> Oct 27, 2023  9:52 AM Retta Caster wrote: Red Word that prompted transfer to Nurse Triage: L4 in back pain /Decision tree instructed since seen before an pain getting worse   FYI Only or Action Required?: FYI only for provider  Patient was last seen in primary care on 06/22/2023 by Marni Sins, MD. Called Nurse Triage reporting Back Pain. Symptoms began About a year ago. Symptoms are: gradually worsening.  Triage Disposition: See PCP Within 2 Weeks  Patient/caregiver understands and will follow disposition?: Yes   Reason for Disposition  Back pain is a chronic symptom (recurrent or ongoing AND present > 4 weeks)  Answer Assessment - Initial Assessment Questions 1. ONSET: When did the pain begin?      Ongoing for a year   2. LOCATION: Where does it hurt? (upper, mid or lower back)     Lower back  3. SEVERITY: How bad is the pain?  (e.g., Scale 1-10; mild, moderate, or severe)   - MILD (1-3): Doesn't interfere with normal activities.    - MODERATE (4-7): Interferes with normal activities or awakens from sleep.    - SEVERE (8-10): Excruciating pain, unable to do any normal activities.      Moderate to severe   4. PATTERN: Is the pain constant? (e.g., yes, no; constant, intermittent)      Constant    5. RADIATION: Does the pain shoot into your legs or somewhere else?     Down to buttocks bilaterally, right worse than left  6. CAUSE:  What do you think is causing the back pain?      Injury 1 year ago  7. BACK OVERUSE:  Any recent lifting of heavy objects, strenuous work or exercise?     No  8. MEDICINES: What have you taken so far for the pain? (e.g., nothing, acetaminophen , NSAIDS)     No  9. NEUROLOGIC SYMPTOMS: Do you have any weakness, numbness, or problems with bowel/bladder control?     No  10. OTHER SYMPTOMS: Do you have any other symptoms? (e.g., fever, abdomen pain, burning with  urination, blood in urine)       No  Protocols used: Back Pain-A-AH

## 2023-10-28 ENCOUNTER — Ambulatory Visit (INDEPENDENT_AMBULATORY_CARE_PROVIDER_SITE_OTHER): Admitting: Internal Medicine

## 2023-10-28 ENCOUNTER — Encounter: Payer: Self-pay | Admitting: Internal Medicine

## 2023-10-28 VITALS — BP 133/84 | Temp 98.4°F | Ht 68.0 in | Wt 128.4 lb

## 2023-10-28 DIAGNOSIS — N401 Enlarged prostate with lower urinary tract symptoms: Secondary | ICD-10-CM

## 2023-10-28 DIAGNOSIS — M4716 Other spondylosis with myelopathy, lumbar region: Secondary | ICD-10-CM | POA: Diagnosis not present

## 2023-10-28 NOTE — Progress Notes (Signed)
 Subjective:  CC: back pain follow up  HPI:  [name] is a 38 y.o. male with a past medical history stated below and presents today for above. Please see problem based assessment and plan for additional details.  Past Medical History:  Diagnosis Date   Abdominal fluid collection 07/11/2022   BPH (benign prostatic hyperplasia)    Bronchitis    Melena 04/25/2019   Multiple gastric ulcers    Peritonitis (HCC) 06/26/2022    Current Outpatient Medications on File Prior to Visit  Medication Sig Dispense Refill   acetaminophen  (TYLENOL ) 500 MG tablet Take 2 tablets (1,000 mg total) by mouth every 6 (six) hours. 30 tablet 0   dicyclomine  (BENTYL ) 20 MG tablet TAKE 1 TABLET (20 MG TOTAL) BY MOUTH 4 (FOUR) TIMES DAILY - BEFORE MEALS AND AT BEDTIME. 360 tablet 1   DULoxetine  (CYMBALTA ) 30 MG capsule Take 1 capsule (30 mg total) by mouth daily. 30 capsule 2   feeding supplement (ENSURE ENLIVE / ENSURE PLUS) LIQD Take 237 mLs by mouth 3 (three) times daily between meals. 237 mL 12   folic acid  (FOLVITE ) 1 MG tablet Take 1 tablet (1 mg total) by mouth daily.     gabapentin  (NEURONTIN ) 400 MG capsule Take 1 capsule (400 mg total) by mouth 3 (three) times daily. Take 300 mg daily at bedtime for 1 week, then 300 mg three times a day 90 capsule 3   methocarbamol  (ROBAXIN ) 500 MG tablet Take 3 tablets (1,500 mg total) by mouth 3 (three) times daily. 90 tablet 1   pantoprazole  (PROTONIX ) 40 MG tablet Take 1 tablet (40 mg total) by mouth daily. 30 tablet 2   simethicone  (MYLICON) 80 MG chewable tablet Chew 1 tablet (80 mg total) by mouth 4 (four) times daily as needed for flatulence. 30 tablet 0   tamsulosin (FLOMAX) 0.4 MG CAPS capsule Take 0.4 mg by mouth daily.     thiamine  (VITAMIN B1) 100 MG tablet Take 1 tablet (100 mg total) by mouth daily. 90 tablet 1   varenicline  (CHANTIX ) 0.5 MG tablet Take 1 tablet (0.5 mg total) by mouth daily. 30 tablet 2   No current  facility-administered medications on file prior to visit.   Review of Systems: ROS negative except for as is noted on the assessment and plan.  Objective:   Vitals:   10/28/23 0842  BP: 133/84  Temp: 98.4 F (36.9 C)  TempSrc: Oral  SpO2: 99%  Weight: 128 lb 6.4 oz (58.2 kg)  Height: 5' 8 (1.727 m)   Physical Exam: Constitutional: well-appearing, in no acute distress Cardiovascular: regular rate and rhythm Pulmonary/Chest: normal work of breathing on room air MSK: normal bulk and tone  Assessment & Plan:   Lumbar spondylosis with myelopathy Patient presents for follow up of back pain. He was diagnosed with lumbar spondylosis of the L5-S1 level by MRI last year. He continues to have symptoms of severe radicular low back and bilateral leg pain, foot drop R >L, occasional difficulty defecating and intermittent numbness in his buttocks. He has been on max dose gabapentin  without improvement, and has also tried PT and epidural spinal injections. He has been referred to neurosurgery in the past, but his lack of insurance was a barrier to being seen by them. He now has insurance and would like a new referral to neurosurgery.  - Ambulatory referral to neurosurgery  Benign prostatic hyperplasia Patient has had symptoms of urinary retention and occasional hematuria. His father is currently  undergoing treatment for prostate cancer. He has been seen by urology in the past. I recommended making another appointment with urology as soon as he can.     Patient discussed with Dr. Gaylon Kea MD [facility]  PGY-1 Pager: [phone] Date 10/28/2023  Time 9:08 AM

## 2023-10-28 NOTE — Assessment & Plan Note (Signed)
 Patient presents for follow up of back pain. He was diagnosed with lumbar spondylosis of the L5-S1 level by MRI last year. He continues to have symptoms of severe radicular low back and bilateral leg pain, foot drop R >L, occasional difficulty defecating and intermittent numbness in his buttocks. He has been on max dose gabapentin  without improvement, and has also tried PT and epidural spinal injections. He has been referred to neurosurgery in the past, but his lack of insurance was a barrier to being seen by them. He now has insurance and would like a new referral to neurosurgery.  - Ambulatory referral to neurosurgery

## 2023-10-28 NOTE — Assessment & Plan Note (Signed)
 Patient has had symptoms of urinary retention and occasional hematuria. His father is currently undergoing treatment for prostate cancer. He has been seen by urology in the past. I recommended making another appointment with urology as soon as he can.

## 2023-10-28 NOTE — Patient Instructions (Signed)
 Mr Koerber,   I have placed another referral to the neurosurgery clinic for your back pain. Here is their contact information as well:   [facility] ADDRESS [address] [place], [place] [address]   PHONE [phone]  I additionally recommend making another appointment with your urologist whenever possible.   Thanks,  Dr Esaw Heckler

## 2023-10-29 NOTE — Progress Notes (Signed)
 Internal Medicine Clinic Attending  Case discussed with the resident at the time of the visit.  We reviewed the resident's history and exam and pertinent patient test results.  I agree with the assessment, diagnosis, and plan of care documented in the resident's note.

## 2023-11-02 ENCOUNTER — Ambulatory Visit: Payer: Self-pay | Admitting: Surgery

## 2023-11-09 ENCOUNTER — Encounter: Payer: Self-pay | Admitting: Neurology

## 2023-11-09 ENCOUNTER — Other Ambulatory Visit: Payer: Self-pay

## 2023-11-09 DIAGNOSIS — R202 Paresthesia of skin: Secondary | ICD-10-CM

## 2023-11-11 NOTE — Patient Instructions (Signed)
 SURGICAL WAITING ROOM VISITATION  Patients having surgery or a procedure may have no more than 2 support people in the waiting area - these visitors may rotate.    Children under the age of 66 must have an adult with them who is not the patient.  Visitors with respiratory illnesses are discouraged from visiting and should remain at home.  If the patient needs to stay at the hospital during part of their recovery, the visitor guidelines for inpatient rooms apply. Pre-op nurse will coordinate an appropriate time for 1 support person to accompany patient in pre-op.  This support person may not rotate.    Please refer to the [facility] website for the visitor guidelines for Inpatients (after your surgery is over and you are in a regular room).       Your procedure is scheduled on:  12/01/2023    Report to [facility] Main Entrance    Report to admitting at   [phone]   Call this number if you have problems the morning of surgery [phone]   Do not eat food :After Midnight.   After Midnight you may have the following liquids until ___ 0430___ AM DAY OF SURGERY  Water  Non-Citrus Juices (without pulp, NO RED-Apple, White grape, White cranberry) Black Coffee (NO MILK/CREAM OR CREAMERS, sugar ok)  Clear Tea (NO MILK/CREAM OR CREAMERS, sugar ok) regular and decaf                             Plain Jell-O (NO RED)                                           Fruit ices (not with fruit pulp, NO RED)                                     Popsicles (NO RED)                                                               Sports drinks like Gatorade (NO RED)                    The day of surgery:  Drink ONE (1) Pre-Surgery Clear Ensure or G2 at 0430 AM ( have completed by )  the morning of surgery. Drink in one sitting. Do not sip.  This drink was given to you during your hospital  pre-op appointment visit. Nothing else to drink after completing the  Pre-Surgery Clear Ensure or G2.           If you have questions, please contact your surgeon's office.       Oral Hygiene is also important to reduce your risk of infection.                                    Remember - BRUSH YOUR TEETH THE MORNING OF SURGERY WITH YOUR REGULAR TOOTHPASTE  DENTURES WILL BE REMOVED PRIOR TO SURGERY PLEASE DO  NOT APPLY Poly grip OR ADHESIVES!!!   Do NOT smoke after Midnight   Stop all vitamins and herbal supplements 7 days before surgery.   Take these medicines the morning of surgery with A SIP OF WATER :  none   DO NOT TAKE ANY ORAL DIABETIC MEDICATIONS DAY OF YOUR SURGERY  Bring CPAP mask and tubing day of surgery.                              You may not have any metal on your body including hair pins, jewelry, and body piercing             Do not wear make-up, lotions, powders, perfumes/cologne, or deodorant  Do not wear nail polish including gel and S&S, artificial/acrylic nails, or any other type of covering on natural nails including finger and toenails. If you have artificial nails, gel coating, etc. that needs to be removed by a nail salon please have this removed prior to surgery or surgery may need to be canceled/ delayed if the surgeon/ anesthesia feels like they are unable to be safely monitored.   Do not shave  48 hours prior to surgery.               Men may shave face and neck.   Do not bring valuables to the hospital. [place] IS NOT             RESPONSIBLE   FOR VALUABLES.   Contacts, glasses, dentures or bridgework may not be worn into surgery.   Bring small overnight bag day of surgery.   DO NOT BRING YOUR HOME MEDICATIONS TO THE HOSPITAL. PHARMACY WILL DISPENSE MEDICATIONS LISTED ON YOUR MEDICATION LIST TO YOU DURING YOUR ADMISSION IN THE HOSPITAL!    Patients discharged on the day of surgery will not be allowed to drive home.  Someone NEEDS to stay with you for the first 24 hours after anesthesia.   Special Instructions: Bring a copy of your healthcare  power of attorney and living will documents the day of surgery if you haven't scanned them before.              Please read over the following fact sheets you were given: IF YOU HAVE QUESTIONS ABOUT YOUR PRE-OP INSTRUCTIONS PLEASE CALL [phone]   If you received a COVID test during your pre-op visit  it is requested that you wear a mask when out in public, stay away from anyone that may not be feeling well and notify your surgeon if you develop symptoms. If you test positive for Covid or have been in contact with anyone that has tested positive in the last 10 days please notify you surgeon.    [place] - Preparing for Surgery Before surgery, you can play an important role.  Because skin is not sterile, your skin needs to be as free of germs as possible.  You can reduce the number of germs on your skin by washing with CHG (chlorahexidine gluconate) soap before surgery.  CHG is an antiseptic cleaner which kills germs and bonds with the skin to continue killing germs even after washing. Please DO NOT use if you have an allergy to CHG or antibacterial soaps.  If your skin becomes reddened/irritated stop using the CHG and inform your nurse when you arrive at Short Stay. Do not shave (including legs and underarms) for at least 48 hours prior to the first CHG shower.  You may shave your face/neck. Please follow these instructions carefully:  1.  Shower with CHG Soap the night before surgery and the  morning of Surgery.  2.  If you choose to wash your hair, wash your hair first as usual with your  normal  shampoo.  3.  After you shampoo, rinse your hair and body thoroughly to remove the  shampoo.                           4.  Use CHG as you would any other liquid soap.  You can apply chg directly  to the skin and wash                       Gently with a scrungie or clean washcloth.  5.  Apply the CHG Soap to your body ONLY FROM THE NECK DOWN.   Do not use on face/ open                           Wound  or open sores. Avoid contact with eyes, ears mouth and genitals (private parts).                       Wash face,  Genitals (private parts) with your normal soap.             6.  Wash thoroughly, paying special attention to the area where your surgery  will be performed.  7.  Thoroughly rinse your body with warm water  from the neck down.  8.  DO NOT shower/wash with your normal soap after using and rinsing off  the CHG Soap.                9.  Pat yourself dry with a clean towel.            10.  Wear clean pajamas.            11.  Place clean sheets on your bed the night of your first shower and do not  sleep with pets. Day of Surgery : Do not apply any lotions/deodorants the morning of surgery.  Please wear clean clothes to the hospital/surgery center.  FAILURE TO FOLLOW THESE INSTRUCTIONS MAY RESULT IN THE CANCELLATION OF YOUR SURGERY PATIENT SIGNATURE_________________________________  NURSE SIGNATURE__________________________________  ________________________________________________________________________

## 2023-11-11 NOTE — Progress Notes (Addendum)
 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

## 2023-11-17 ENCOUNTER — Encounter (HOSPITAL_COMMUNITY)
Admission: RE | Admit: 2023-11-17 | Discharge: 2023-11-17 | Disposition: A | Discharge status: Home / Self Care | Payer: Self-pay | Source: Ambulatory Visit | Attending: Surgery | Admitting: Surgery

## 2023-11-17 ENCOUNTER — Other Ambulatory Visit: Payer: Self-pay

## 2023-11-17 ENCOUNTER — Encounter (HOSPITAL_COMMUNITY): Payer: Self-pay

## 2023-11-17 VITALS — BP 109/76 | HR 81 | Temp 98.6°F | Resp 16 | Ht 68.0 in | Wt 117.0 lb

## 2023-11-17 DIAGNOSIS — N400 Enlarged prostate without lower urinary tract symptoms: Secondary | ICD-10-CM | POA: Insufficient documentation

## 2023-11-17 DIAGNOSIS — Z01818 Encounter for other preprocedural examination: Secondary | ICD-10-CM

## 2023-11-17 DIAGNOSIS — K631 Perforation of intestine (nontraumatic): Secondary | ICD-10-CM | POA: Insufficient documentation

## 2023-11-17 DIAGNOSIS — K219 Gastro-esophageal reflux disease without esophagitis: Secondary | ICD-10-CM | POA: Insufficient documentation

## 2023-11-17 DIAGNOSIS — K432 Incisional hernia without obstruction or gangrene: Secondary | ICD-10-CM | POA: Insufficient documentation

## 2023-11-17 DIAGNOSIS — Z01812 Encounter for preprocedural laboratory examination: Secondary | ICD-10-CM | POA: Insufficient documentation

## 2023-11-17 DIAGNOSIS — F17210 Nicotine dependence, cigarettes, uncomplicated: Secondary | ICD-10-CM | POA: Insufficient documentation

## 2023-11-17 HISTORY — DX: Cardiac murmur, unspecified: R01.1

## 2023-11-17 HISTORY — DX: Gastro-esophageal reflux disease without esophagitis: K21.9

## 2023-11-17 LAB — CBC
HCT: 38.2 % — ABNORMAL LOW (ref 39.0–52.0)
Hemoglobin: 12.9 g/dL — ABNORMAL LOW (ref 13.0–17.0)
MCH: 30.6 pg (ref 26.0–34.0)
MCHC: 33.8 g/dL (ref 30.0–36.0)
MCV: 90.5 fL (ref 80.0–100.0)
Platelets: 371 K/uL (ref 150–400)
RBC: 4.22 MIL/uL (ref 4.22–5.81)
RDW: 14.8 % (ref 11.5–15.5)
WBC: 6.4 K/uL (ref 4.0–10.5)
nRBC: 0 % (ref 0.0–0.2)

## 2023-11-18 ENCOUNTER — Encounter (HOSPITAL_COMMUNITY): Payer: Self-pay | Admitting: Physician Assistant

## 2023-11-18 NOTE — Progress Notes (Signed)
 Anesthesia Chart Review   Case: [id number] Date/Time: 12/01/23 0715   Procedures:      REPAIR, HERNIA, EPIGASTRIC, ADULT     PROCEDURE, ADVANCEMENT FLAP, ABDOMEN (Bilateral) - OPEN INCISIONAL AND EPIGASTRIC HERNIA REPAIR WITH MESH, BILATERAL POSTERIOR RECTUS MYOFASCIAL RELEASE, POSSIBLE BILATERAL TRANSVERSUS ABDOMINIS RELEASE   Anesthesia type: General   Diagnosis:      Bowel perforation (HCC) [K63.1]     Incisional hernia, without obstruction or gangrene [K43.2]   Pre-op diagnosis: EPIGASSTRIC AND INCISIONAL HERNIAS   Location: [facility] ROOM 02 / [facility]   Surgeons: Lyndel Deward PARAS, MD       DISCUSSION:38 y.o. smoker with h/o GERD, BPH, epigastric and incisional hernias scheduled for above procedure 12/01/23 with Dr. Deward Lyndel.   Pt reports he can climb a flight of stairs without difficulty, no chest pain or shortness of breath.  Murmur noted in patient history.  Echo 09/24/2022 with no valvular problems, EF 60-65%.   Pt follows with [facility].  Last seen 10/28/2023.  VS: BP 109/76   Pulse 81   Temp 37 C (Oral)   Resp 16   Ht 5' 8 (1.727 m)   Wt 53.1 kg   SpO2 100%   BMI 17.79 kg/m   PROVIDERS: Napoleon Limes, MD is PCP    LABS: Labs reviewed: Acceptable for surgery. (all labs ordered are listed, but only abnormal results are displayed)  Labs Reviewed  CBC - Abnormal; Notable for the following components:      Result Value   Hemoglobin 12.9 (*)    HCT 38.2 (*)    All other components within normal limits     IMAGES:   EKG:   CV: Echo 09/24/2022 1. Left ventricular ejection fraction, by estimation, is 60 to 65%. The  left ventricle has normal function. The left ventricle has no regional  wall motion abnormalities. There is mild left ventricular hypertrophy.  Left ventricular diastolic parameters  are indeterminate.   2. Right ventricular systolic function is normal. The right ventricular  size is normal. Tricuspid regurgitation signal  is inadequate for assessing  PA pressure.   3. The mitral valve is normal in structure. No evidence of mitral valve  regurgitation.   4. The aortic valve is tricuspid. Aortic valve regurgitation is not  visualized.   5. Aortic not well visualized.   6. The inferior vena cava is normal in size with greater than 50%  respiratory variability, suggesting right atrial pressure of 3 mmHg.  Past Medical History:  Diagnosis Date   Abdominal fluid collection 07/11/2022   BPH (benign prostatic hyperplasia)    Bronchitis    GERD (gastroesophageal reflux disease)    Heart murmur    Melena 04/25/2019   Multiple gastric ulcers    Peritonitis (HCC) 06/26/2022    Past Surgical History:  Procedure Laterality Date   CHOLECYSTECTOMY N/A 06/25/2022   Procedure: LAPAROSCOPIC CHOLECYSTECTOMY;  Surgeon: Lyndel Deward PARAS, MD;  Location: [facility];  Service: General;  Laterality: N/A;   COLONOSCOPY     I & D EXTREMITY  10/18/2011   Procedure: IRRIGATION AND DEBRIDEMENT EXTREMITY;  Surgeon: Elsie Mussel, MD;  Location: [facility];  Service: Orthopedics;  Laterality: Right;  Irrigation and Debridement  Right Middle Finger; Removal of foreign body   LAPAROTOMY N/A 06/27/2022   Procedure: EXPLORATORY LAPAROTOMY, WASH OUT,  ABDOMINAL CLOSURE;  Surgeon: Lyndel Deward PARAS, MD;  Location: [facility];  Service: General;  Laterality: N/A;    MEDICATIONS:  acetaminophen  (TYLENOL ) 500  MG tablet   FOLIC ACID  PO   No current facility-administered medications for this encounter.    Harlene Hoots Ward, PA-C [facility] [phone]

## 2023-12-01 ENCOUNTER — Encounter (HOSPITAL_COMMUNITY): Admission: RE | Payer: Self-pay | Source: Home / Self Care

## 2023-12-01 ENCOUNTER — Inpatient Hospital Stay (HOSPITAL_COMMUNITY): Admission: RE | Admit: 2023-12-01 | Source: Home / Self Care | Admitting: Surgery

## 2023-12-01 SURGERY — REPAIR, HERNIA, EPIGASTRIC, ADULT
Anesthesia: General

## 2023-12-07 ENCOUNTER — Telehealth: Payer: Self-pay

## 2023-12-07 NOTE — Telephone Encounter (Signed)
 Copied from CRM [id number]. Topic: MyChart - Other >> Dec 07, 2023 12:39 PM Carrielelia G wrote: Reason for CRM: [employer] will be faxing over a questionnaire to be completed and returned.

## 2023-12-08 NOTE — Telephone Encounter (Signed)
 I contacted pt to let him know that I have received the form from [facility], but no answer. Unable to leave message, vm is not setup. If pt call back, please sch pt an appt to discuss about this form.

## 2023-12-21 ENCOUNTER — Encounter: Admitting: Neurology

## 2023-12-21 ENCOUNTER — Encounter: Payer: Self-pay | Admitting: Neurology

## 2023-12-21 DIAGNOSIS — Z029 Encounter for administrative examinations, unspecified: Secondary | ICD-10-CM

## 2024-01-22 ENCOUNTER — Telehealth: Payer: Self-pay

## 2024-01-22 NOTE — Telephone Encounter (Signed)
 Patient was contacted via telephone this morning to schedule an appointment.  Appointment is need to complete paperwork on patient's behalf from [facility].  No answer, but was able to leave detailed message to call our office back to schedule an appointment at his earliest convenience.

## 2024-01-25 ENCOUNTER — Ambulatory Visit (INDEPENDENT_AMBULATORY_CARE_PROVIDER_SITE_OTHER): Payer: Self-pay | Admitting: Student

## 2024-01-25 ENCOUNTER — Telehealth: Payer: Self-pay | Admitting: *Deleted

## 2024-01-25 ENCOUNTER — Other Ambulatory Visit: Payer: Self-pay

## 2024-01-25 VITALS — BP 122/83 | HR 72 | Temp 98.1°F | Ht 68.0 in | Wt 123.2 lb

## 2024-01-25 DIAGNOSIS — R1084 Generalized abdominal pain: Secondary | ICD-10-CM | POA: Diagnosis not present

## 2024-01-25 DIAGNOSIS — M4716 Other spondylosis with myelopathy, lumbar region: Secondary | ICD-10-CM | POA: Diagnosis not present

## 2024-01-25 DIAGNOSIS — Z79899 Other long term (current) drug therapy: Secondary | ICD-10-CM

## 2024-01-25 DIAGNOSIS — K209 Esophagitis, unspecified without bleeding: Secondary | ICD-10-CM | POA: Diagnosis not present

## 2024-01-25 DIAGNOSIS — K631 Perforation of intestine (nontraumatic): Secondary | ICD-10-CM

## 2024-01-25 DIAGNOSIS — Z8719 Personal history of other diseases of the digestive system: Secondary | ICD-10-CM

## 2024-01-25 MED ORDER — OMEPRAZOLE 40 MG PO CPDR: 40.0000 mg | DELAYED_RELEASE_CAPSULE | Freq: Every day | ORAL | 0 refills | Status: DC

## 2024-01-25 NOTE — Telephone Encounter (Signed)
 Copied from CRM #[id number]. Topic: Referral - Request for Referral >> Jan 22, 2024 12:02 PM Diannia H wrote: Did the patient discuss referral with their provider in the last year? Yes (If No - schedule appointment) (If Yes - send message)  Appointment offered? No  Type of order/referral and detailed reason for visit: Gastro  Preference of office, provider, location: [facility]  If referral order, have you been seen by this specialty before? Yes (If Yes, this issue or another issue? When? Where?  Can we respond through MyChart? Yes

## 2024-01-25 NOTE — Progress Notes (Unsigned)
   Established Patient Office Visit  Subjective   Patient ID: Gabriel Dalton, male    DOB: 02-23-1986  Age: 38 y.o. MRN: [id number]  Chief Complaint  Patient presents with  . Abdominal Pain    REQUESTING GI REFERRAL-[facility] / CHRONIC ABD PAIN # 7 / PAPER WORK    Mr.Isiaih Dorrell Delahunt is a 38 y.o. male with past medical history of BPH, lumbar spondylosis with myelopathy presents today for  Review of Systems:  As per assessment and Plan   Objective:     Vitals:   01/25/24 0853  BP: 122/83  Pulse: 72  Temp: 98.1 F (36.7 C)  TempSrc: Oral  SpO2: 98%  Weight: 123 lb 3.2 oz (55.9 kg)  Height: 5' 8 (1.727 m)   ***  Physical Exam General: Sitting in chair, no acute distress Cardiovascular: Regular rate Pulmonary: Breathing comfortably Abdomen: Soft, nontender, nondistended MSK: No lower extremity edema bilaterally  {Labs (Optional):23779}  The ASCVD Risk score (Arnett DK, et al., 2019) failed to calculate for the following reasons:   The 2019 ASCVD risk score is only valid for ages 29 to 42    Assessment & Plan:   Patient {GC/GE:[id number]::discussed with,seen with} Dr. Lovie  Documents - [employer]?  - LONG TERM DISBAILITY  Perforation bowel (CMS/HHS-HCC)  07/2022  Incisional hernia, without obstruction or gangrene Hx of espohagitis  - who underwent exploratory laparotomy with small bowel resection with temporary abdominal closure on 06/25/22, and washout with abdominal closure on 06/27/22.  - Planned hernia repair for 7/22 - financial constratins. EGD 12/2021 that showed LA Grade B reflux esophagitis with no bleeding. - Gastritis. Colonosphy 12/2021 that showed Non- bleeding external and internal hemorrhoids. - Continues to have postprandial abdominal cramping and burning sensation with Intermittent N/V with food. BM with diarrhea. No hematchezia or melena - Reports that he has been having BM every 3rd day, diarrheal like. Has  tried miralax  but that worsened cramping.  - Prilosec every day - Here requesting referral [facility]   Lumbar spondylosis with myelopathy - She was diagnosed with lumbar spondylosis of the L5/S1 level by MRI last year.  Patient has tried max dose of gabapentin  without improvement in the past and has also tried PT in the past with epidural and spinal injections.  At the last office visit, patient was referred to neurosurgery, was seen on 12/22/2023 with plans for nerve conduction studies.   Flu shot - refused   Dtap - refused   Problem List Items Addressed This Visit   None   No follow-ups on file.    Toma Edwards, DO

## 2024-01-25 NOTE — Telephone Encounter (Signed)
Pt is currently being seen.

## 2024-01-25 NOTE — Patient Instructions (Signed)
 Thank you, Mr.Racer Dorrell Thunder for allowing us  to provide your care today. Today we discussed:   - Start Omeprazole  40 mg daily - Referral to [facility] is placed     I have ordered the following labs for you:  Lab Orders  No laboratory test(s) ordered today     Tests ordered today:  None   Referrals ordered today:    Referral Orders         Ambulatory referral to Gastroenterology      I have ordered the following medication/changed the following medications:   Stop the following medications: There are no discontinued medications.   Start the following medications: Meds ordered this encounter  Medications   omeprazole  (PRILOSEC) 40 MG capsule    Sig: Take 1 capsule (40 mg total) by mouth daily.    Dispense:  90 capsule    Refill:  0     Follow up: 3 months    Remember:   Should you have any questions or concerns please call the internal medicine clinic at [phone].     Rayann Atway, D.O. [facility]

## 2024-01-27 NOTE — Assessment & Plan Note (Signed)
 He was diagnosed with lumbar spondylosis of the L5/S1 level by MRI last year.  Patient has tried max dose of gabapentin  without improvement in the past and has also tried PT in the past with epidural and spinal injections.  At the last office visit, patient was referred to neurosurgery, was seen on 12/22/2023 with plans for nerve conduction studies.

## 2024-01-27 NOTE — Assessment & Plan Note (Addendum)
 Presents today with c/o of ongoing postprandial abdominal cramping and burning sensation mostly in the lower abdomen, denies RUQ or epigastric pain. Reports  Intermittent N/V with food. No hematchezia or melena. Reports BM every 3rd day with small amounts of diarrhea. Has tried Miralax  for constipation but made cramping worse. Of note, patient underwent exploratory laparotomy with small bowel resection with temporary abdominal closure on 06/25/22, and washout with abdominal closure on 06/27/22. Planned hernia repair on 7/22, but unable to go for surgery d/t financial constraints. On exam, he appears thin, pain lower abdomen upon palpation. EGD 12/2021 that showed LA Grade B reflux esophagitis with no bleeding, Gastritis. Colonoscopy 12/2021 that showed Non- bleeding external and internal hemorrhoids. Based in clinical findings, more likely gastritis and constipation that is making his symptoms worse, less likely bilary cholic as no RUQ pain, though will definitely benefit from a referral to GI to further assess his abdominal pain.  -Omeprazole  40 mg daily - Referral to GI is placed

## 2024-02-01 NOTE — Progress Notes (Signed)
 Internal Medicine Clinic Attending  Case discussed with the resident at the time of the visit.  We reviewed the resident's history and exam and pertinent patient test results.  I agree with the assessment, diagnosis, and plan of care documented in the resident's note.

## 2024-02-09 ENCOUNTER — Ambulatory Visit (INDEPENDENT_AMBULATORY_CARE_PROVIDER_SITE_OTHER): Admitting: Neurology

## 2024-02-09 DIAGNOSIS — R202 Paresthesia of skin: Secondary | ICD-10-CM

## 2024-02-09 DIAGNOSIS — G6281 Critical illness polyneuropathy: Secondary | ICD-10-CM

## 2024-02-09 NOTE — Procedures (Signed)
 [facility]  [address] [place], [address]  [place], [place] [address] Tel: [phone] Fax: [phone] Test Date:  02/09/2024  Patient: Gabriel Dalton DOB: [birth year] Physician: Venetia Potters, MD  Sex: Male Height: 5' 8 Ref Phys: Gerldine Maizes, MD  ID#: [id number]   Technician:    History: This is a 38 year old male with right leg pain and weakness.  NCV & EMG Findings: Extensive electrodiagnostic evaluation of the right lower limb with additional nerve conduction studies of the left lower limb shows: Bilateral sural and superficial peroneal/fibular sensory responses are absent. Bilateral peroneal/fibular (EDB) motor responses are absent. Bilateral tibial (AH) motor responses show reduced amplitude (right 4.2, left 4.9 mV). Bilateral peroneal/fibular (TA) motor responses show reduced amplitude (right 3.0, left 3.4 mV). Right H reflex latency is within normal limits. Chronic motor axon loss changes without accompanying active denervation changes are seen in the right tibialis anterior, medial head of gastrocnemius, and flexor digitorum longus muscles.  Impression: This is an abnormal study. 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 No electrodiagnostic evidence of an overlapping right or left peroneal/fibular mononeuropathy. No electrodiagnostic evidence of a right lumbosacral (L3-S1) motor radiculopathy.    ___________________________ Venetia Potters, MD    Nerve Conduction Studies Motor Nerve Results    Latency Amplitude F-Lat Segment Distance CV Comment  Site (ms) Norm (mV) Norm (ms)  (cm) (m/s) Norm   Left Fibular (EDB) Motor  Ankle *NR  < 5.5 *NR  > 3.0        Right Fibular (EDB) Motor  Ankle *NR  < 5.5 *NR  > 3.0        Bel fib head *NR - *NR -  Bel fib head-Ankle - *NR  > 40   Pop fossa *NR - *NR -  Pop fossa-Bel fib head - *NR -    Left Fibular (TA) Motor  Fib head 2.2  < 4.0 *3.4  > 4.0        Right Fibular (TA) Motor  Fib head 1.55  < 4.0 *3.0  > 4.0        Pop fossa 3.2  < 6.7 2.7 -  Pop fossa-Fib head 9 55  > 40   Left Tibial (AH) Motor  Ankle 5.1  < 6.0 *4.9  > 8.0        Right Tibial (AH) Motor  Ankle 4.8  < 6.0 *4.2  > 8.0        Knee 11.7 - 3.9 -  Knee-Ankle 41 59  > 40    Sensory Sites    Neg Peak Lat Amplitude (O-P) Segment Distance Velocity Comment  Site (ms) Norm (V) Norm  (cm) (ms)   Left Superficial Fibular Sensory  14 cm-Ankle *NR  < 4.5 *NR  > 5 14 cm-Ankle 14    Right Superficial Fibular Sensory  14 cm-Ankle *NR  < 4.5 *NR  > 5 14 cm-Ankle 14    Left Sural Sensory  Calf-Lat mall *NR  < 4.5 *NR  > 5 Calf-Lat mall 14    Right Sural Sensory  Calf-Lat mall *NR  < 4.5 *NR  > 5 Calf-Lat mall 14     H-Reflex Results    M-Lat H Lat H Neg Amp H-M Lat  Site (ms) (ms) Norm (mV) (ms)  Right Tibial H-Reflex  Pop fossa 4.3 26.9  < 35.0 1.20 22.6   Electromyography  Side Muscle Ins.Act Fibs Fasc Recrt Amp Dur Poly Activation Comment  Right Tib ant Nml Nml Nml *3- Nml *1+ *1+ Nml N/A  Right Gastroc MH Nml Nml Nml *2- *1+ *1+ *1+ Nml N/A  Right FDL Nml Nml Nml *2- *1+ *1+ *1+ Nml N/A  Right Rectus fem Nml Nml Nml Nml Nml Nml Nml Nml N/A  Right Biceps fem SH Nml Nml Nml Nml Nml Nml Nml Nml N/A  Right Gluteus med Nml Nml Nml Nml Nml Nml Nml Nml N/A  Right Lumbar PSP lower Nml Nml Nml Nml Nml Nml Nml Nml N/A      Waveforms:  Motor               Sensory           H-Reflex

## 2024-04-26 ENCOUNTER — Ambulatory Visit: Payer: Self-pay

## 2024-04-26 NOTE — Telephone Encounter (Signed)
 FYI Only or Action Required?: FYI only for provider: appointment scheduled on 04/27/24.  Patient was last seen in primary care on 01/25/2024 by Heddy Barren, DO.  Called Nurse Triage reporting Migraine (/) and Abdominal Pain.  Symptoms began several months ago.  Interventions attempted: Prescription medications: Prilosec.  Symptoms are: gradually worsening.  Triage Disposition: See PCP Within 2 Weeks  Patient/caregiver understands and will follow disposition?: Yes   Reason for Disposition  Headache is a chronic symptom (recurrent or ongoing AND present > 4 weeks)  Abdominal pain is a chronic symptom (recurrent or ongoing AND present > 4 weeks)  Answer Assessment - Initial Assessment Questions Spoke with pts wife Norva (on [place]). Reports hx of migraines and abdominal pain. Have gotten worse over the past 2 months.   Generalized abdominal pain previously was 3-4/10 only after eating, has progressed to mild chronic pain at rest and 10/10 pain after eating. Some nausea, no emesis. Denies fever or blood in stool. Chronic diarrhea. Hx of bowel obstruction, requiring emergency surgery 1.5 years ago. Hx of 2 hernias, has not yet had surgery for insurance reasons.  Hx of migraines, previously was having them 1x every 2 weeks, now occurring weekly. 10/10 pain, have not worsened in severity. Light sensitivity. Denies changes to speech or vision, numbness, tingling or weakness. Able to walk around fine.   Scheduled appt with PCP Thursday morning 12/18. Advised ED or 911 for worsening symptoms.   1. LOCATION: Where does it hurt?      Temples  2. ONSET: When did the headache start? (e.g., minutes, hours, days)      Chronic migraines, worse over the past 2 months  3. PATTERN: Does the pain come and go, or has it been constant since it started?     Occur about weekly, previously was only once every 2 weeks.  4. SEVERITY: How bad is the pain? and What does it keep you from doing?   (e.g., Scale 1-10; mild, moderate, or severe)     10/10, historically has gotten up to 10/10  5. RECURRENT SYMPTOM: Have you ever had headaches before? If Yes, ask: When was the last time? and What happened that time?      Hx of migraines, have always been severe  6. CAUSE: What do you think is causing the headache?     Unsure  7. MIGRAINE: Have you been diagnosed with migraine headaches? If Yes, ask: Is this headache similar?      Yes  8. HEAD INJURY: Has there been any recent injury to your head?      Denies  9. OTHER SYMPTOMS: Do you have any other symptoms? (e.g., fever, stiff neck, eye pain, sore throat, cold symptoms)     Abdominal pain  Answer Assessment - Initial Assessment Questions 1. LOCATION: Where does it hurt?      All over  2. RADIATION: Does the pain shoot anywhere else? (e.g., chest, back)     Sometimes radiates to the back  3. ONSET: When did the pain begin? (Minutes, hours or days ago)      Chronic, worse over the past 2 months.  4. SUDDEN: Gradual or sudden onset?     Gradual  5. PATTERN Does the pain come and go, or is it constant?     Fluctuates. 3-4/10 pain at baseline, 10/10 after eating.  6. SEVERITY: How bad is the pain?  (e.g., Scale 1-10; mild, moderate, or severe)     3-4/10 at rest, up to 10/10  after eating.  7. RECURRENT SYMPTOM: Have you ever had this type of stomach pain before? If Yes, ask: When was the last time? and What happened that time?      Yes. Last time occurred was due to bowel obstruction that required emergency surgery where they cleaned him out. No resection performed.  8. CAUSE: What do you think is causing the stomach pain? (e.g., gallstones, recent abdominal surgery)     Unsure  9. RELIEVING/AGGRAVATING FACTORS: What makes it better or worse? (e.g., antacids, bending or twisting motion, bowel movement)     Eating makes it worse  10. OTHER SYMPTOMS: Do you have any other symptoms?  (e.g., back pain, diarrhea, fever, urination pain, vomiting)       Diarrhea, chronic. Denies rest of above.  Protocols used: Headache-A-AH, Abdominal Pain - Male-A-AH

## 2024-04-26 NOTE — Telephone Encounter (Signed)
 Attempt # 1 to reach patient to triage symptoms. Left VM to call back    Message from Chamberlain B sent at 04/26/2024  9:44 AM EST  Summary: [phone] Spouse Norva Ada   Reason for Triage: More frequent migraines pain has gotten worse, stomach issues are reoccurring (constipation and digestion)

## 2024-04-26 NOTE — Telephone Encounter (Signed)
 Talked to pt who stated d/t his job, he cannot come until Thursday. Appt Thursday 12/18 with Dr Napoleon.

## 2024-04-28 ENCOUNTER — Telehealth: Payer: Self-pay

## 2024-04-28 ENCOUNTER — Ambulatory Visit (INDEPENDENT_AMBULATORY_CARE_PROVIDER_SITE_OTHER)

## 2024-04-28 ENCOUNTER — Other Ambulatory Visit: Payer: Self-pay | Admitting: Student

## 2024-04-28 ENCOUNTER — Other Ambulatory Visit: Payer: Self-pay

## 2024-04-28 VITALS — BP 119/74 | HR 78 | Temp 97.8°F

## 2024-04-28 DIAGNOSIS — K209 Esophagitis, unspecified without bleeding: Secondary | ICD-10-CM

## 2024-04-28 DIAGNOSIS — Z79899 Other long term (current) drug therapy: Secondary | ICD-10-CM | POA: Diagnosis not present

## 2024-04-28 DIAGNOSIS — G6281 Critical illness polyneuropathy: Secondary | ICD-10-CM

## 2024-04-28 DIAGNOSIS — G43709 Chronic migraine without aura, not intractable, without status migrainosus: Secondary | ICD-10-CM

## 2024-04-28 DIAGNOSIS — F17210 Nicotine dependence, cigarettes, uncomplicated: Secondary | ICD-10-CM | POA: Diagnosis not present

## 2024-04-28 DIAGNOSIS — Z720 Tobacco use: Secondary | ICD-10-CM

## 2024-04-28 MED ORDER — PREGABALIN 50 MG PO CAPS: 50.0000 mg | ORAL_CAPSULE | Freq: Every day | ORAL | 3 refills | Status: AC

## 2024-04-28 MED ORDER — SUMATRIPTAN SUCCINATE 50 MG PO TABS: 50.0000 mg | ORAL_TABLET | Freq: Once | ORAL | 2 refills | Status: DC | PRN

## 2024-04-28 MED ORDER — PREGABALIN 50 MG PO CAPS: 50.0000 mg | ORAL_CAPSULE | Freq: Every day | ORAL | 11 refills | Status: DC

## 2024-04-28 MED ORDER — NICOTINE 7 MG/24HR TD PT24: 7.0000 mg | MEDICATED_PATCH | Freq: Every day | TRANSDERMAL | 11 refills | Status: AC

## 2024-04-28 NOTE — Telephone Encounter (Signed)
 Received a fax from the pharmacy for a rx for Pregablin. Per pharmacy the rx cannot have 11 refills. Please send in a new rx with proper qty.

## 2024-04-28 NOTE — Assessment & Plan Note (Signed)
 Patient was last seen on 9/15 by Dr. Heddy for postprandial abdominal cramping and burning sensation. Know history of Grade B reflux esophigitis with no bleeding by EGD on 12/2021. Colonoscopy in 2023 with hemorrhoids. At that time, he was prescribed Omeprazole  and referred to GI. He has not seen a GI physician as of yet. He has been taking Omeprazole  daily with some benefit.  - Patient aware previous GI referral and will call to schedule appointment. -Continue omeprazole 

## 2024-04-28 NOTE — Telephone Encounter (Signed)
 [facility] stated control meds can only have 5 refills. Stated Dr Tobie has already corrected the Lyrica  rx.

## 2024-04-28 NOTE — Progress Notes (Unsigned)
° °  CC: Acute Concern of migraine headaches  HPI:  Gabriel Dalton is a 38 y.o. male with pertinent PMH of esophagitis, SBO, Lumbar spondylosis with myelopathy who presents for chief concern of migraine headaches. Please see problem based assessment and plan for further history.  ROS  Medications: Current Outpatient Medications  Medication Instructions   acetaminophen  (TYLENOL ) 500-1,000 mg, Oral, 2 times daily PRN   FOLIC ACID  PO 1 tablet, Oral, 3 times weekly   omeprazole  (PRILOSEC) 40 mg, Oral, Daily     Physical Exam:  There were no vitals filed for this visit.  Physical Exam Constitutional:      General: He is not in acute distress.    Appearance: He is not ill-appearing.  Neurological:     Mental Status: He is alert and oriented to person, place, and time.     Cranial Nerves: Cranial nerves 2-12 are intact. No cranial nerve deficit or facial asymmetry.     Motor: Weakness present.     Comments: Left sided leg weakness. Decreased sensation bilateral lower extremities       Assessment & Plan:   Assessment & Plan Esophagitis determined by endoscopy Patient was last seen on 9/15 by Dr. Heddy for postprandial abdominal cramping and burning sensation. Know history of Grade B reflux esophigitis with no bleeding by EGD on 12/2021. Colonoscopy in 2023 with hemmerhoids. At that time, he was prescribed Omeprazole  and referred to GI. He has *** not seen a GI physician as of yet. He has been taking Omeprazole  daily with *** benefit.  No orders of the defined types were placed in this encounter.  Last 2 months opening eyes painful. Over the right temple.stopped having migraines. Excedrin. Over 2-3 years migraine. Goodys PM Tylenol  helpful. Episodes normally last about an hour or. Throbbing over the temple. Photophobia.   Eye watering and tearing.  Patient {GC/GE:[id number]::discussed with,seen with} {[id number]/2026:[id number]}  ***HPI  deleted ***Routed ***spell check  Melvenia Morrison, MD [facility] Internal [facility] Resident PGY-1 Clinic Phone: [phone] Please contact the on call pager at [phone] for any urgent or emergent needs.

## 2024-04-28 NOTE — Patient Instructions (Addendum)
 Thank you, Gabriel Dalton [facility], for allowing us  to provide your care today. Today we discussed . . .  > Migraines       - I have sent in the prescription for sumatriptan.  Use this when he first started to notice a migraine developing.  Please try to limit the use to no more than 3 times a week.  If you find yourself needing to use it more often than this we may need to consider other medications. > Neuropathy       - It is unclear what the cause of your neuropathy is at this point.  Please follow-up with your neurologist for further diagnoses.  Until your visit we can try Lyrica  50 mg daily to see if this helps any of your symptoms.   I have ordered the following labs for you:  Lab Orders  No laboratory test(s) ordered today      Referrals ordered today:   Referral Orders  No referral(s) requested today      Follow up: 3 months    Remember:  Should you have any questions or concerns please call the internal medicine clinic at [phone].     Melvenia Morrison, [facility]

## 2024-04-28 NOTE — Telephone Encounter (Signed)
 Copied from CRM #[id number]. Topic: Clinical - Prescription Issue >> Apr 28, 2024  3:38 PM [name] wrote: Reason for CRM: [facility] medication LYRICA  needs to be corrected by the doctor before the pharmacy can fill it  pregabalin  (LYRICA ) 50 MG capsule  [id number] [place], [place] - [address] [place] [address] Phone: [phone] Fax: [phone]

## 2024-04-29 DIAGNOSIS — G6281 Critical illness polyneuropathy: Secondary | ICD-10-CM | POA: Insufficient documentation

## 2024-04-29 DIAGNOSIS — Z720 Tobacco use: Secondary | ICD-10-CM | POA: Insufficient documentation

## 2024-04-29 DIAGNOSIS — G43709 Chronic migraine without aura, not intractable, without status migrainosus: Secondary | ICD-10-CM | POA: Insufficient documentation

## 2024-04-29 NOTE — Assessment & Plan Note (Signed)
 Patient's primary concern today is a reoccurrence of migraine headaches.  He previously had this issue 2 to 3 years ago at which time he was managing his symptoms with Tylenol  and ibuprofen .  He has had migraine headaches about once a month over the past 2 years but they have increased in frequency to daily or twice daily over the past 2 months.  This is coming in conjunction with him starting a new job which he finds somewhat stressful.  He describes his headaches as a throbbing, temporal, and with 10 out of 10 pain.  He endorses photophobia as well.  There is no aura symptoms prior to the onset headache.  The headaches last about 30 minutes to an hour.  He has tried Tylenol  without any benefit.  He does find benefit with Goody's powder but is aware that he is not supposed to be taking this because of his GI history.  So would like to avoid taking NSAIDs in the future.  He has sudden tearing when he looks at bright lights but no tearing when not looking at lights.  Other than the timing of his symptoms being an hour or less, these are consistent with migraine headaches.  I do not think these represent cluster headaches but that is from the differential.  Due to the necessity to avoid NSAIDs in this patient, we will trial sumatriptan.  - Sumatriptan 50 mg as needed as needed, instructed not to use more than 3 times a week to avoid rebound headaches. -Follow-up in 3 months or earlier if symptoms do not dissipate.

## 2024-04-29 NOTE — Assessment & Plan Note (Signed)
 Symptoms of lower extremity weakness that began after a exploratory laparotomy with small bowel resection and being bedbound for so long.  Currently sees neurology for this.  He just had an EMG in September which showed large fiber sensorimotor polyneuropathy with axonal loss.  He has no lumbar spine symptoms or significant stenosis per my reading of the neurosurgery note, although we do not have his MRI in our system.  He has tried gabapentin  without any benefit.  Will try and see if Lyrica  is of any benefit to him.  He has an appointment with neurology on January 9 for this problem.  - Lyrica  50 mg daily - Follow-up with neurology as scheduled

## 2024-04-29 NOTE — Assessment & Plan Note (Signed)
 Patient reports continued tobacco use, he is decreasing over time to about a half a pack a day.  He does not smoke at home, only when he works.  He feels like his cravings have improved with the right incline but he still has some cravings.  He is interested in trying nicotine  patches to help with stopping smoking.  - 7 minutes of tobacco cessation counseling provided at this visit to include behavior modification, discussion of potential medications, and identification of social supports. -Prescribed 7 mg nicotine  patch every 24 hours

## 2024-05-09 NOTE — Progress Notes (Signed)
 "  Initial neurology clinic note  Reason for Evaluation: Consultation requested by Napoleon Limes, MD for an opinion regarding neuropathy. My final recommendations will be communicated back to the requesting physician by way of shared medical record or letter to requesting physician via [place]  mail.  HPI: This is Mr. Arrow Tomko, a 38 y.o. right-handed male with a medical history of pre-DM, esophagitis, SBO, current smoker who presents to neurology clinic with the chief complaint of leg pain and weakness. The patient is accompanied by wife.  Patient's symptoms started 3-4 years ago. He was having a lot of stomach problems. He was losing weight and having diarrhea. He could not eat much. He was having a lot of headaches and taking a lot of medication. He was found to have ulcers after endoscopy by GI. He felt he was getting weak and didn't feel well. He went to the hospital and there was some mild concern for gall stones, but not a lot to explain symptoms. He decided to have his gallbladder taken out, but during surgery, it was discovered that he had intestinal rupture. He had extensive surgeries and was in the ICU. He was in the hospital from 06/24/22 to 07/12/22 per notes. While in the hospital, he started noticing pain in his legs, right more than left. He was given PT and found to foot drop bilateral requiring AFOs. This has improved and he does not have AFOs anymore.   MRI lumbar spine from 11/2023 without significant stenosis. 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  Currently, he has tingling and burning sensation more at rest. It is bilateral but more on the right. He does not feel his feet well. He feels his legs are cold all the time. When he is moving, he does not feel it as much. He will have occasional cramping in toes or calves. In terms of weakness, he has most trouble wiggling the toes.  He has tried gabapentin  but this did not help. PCP ordered Lyrica   50 mg daily on 04/28/24 which he is taking. He has not noticed a big help with this at that dosage.  Patient has been seeing Dr. Lanis, MD at [facility] who referred patient to me and pain management.  Patient's weight still fluctuates and he continues to have abdominal pain. He is re-establishing care with GI soon.  EtOH use: no  Restrictive diet? no Family history of neuropathy/myopathy/neurologic disease? Mother with sciatica and GI issues  In terms of headaches, he thinks these are stress related due to his job. He had been doing well, but in the last 2-3 months, this has gotten worse. The headache is on the right temple area and feels very sharp and severe. It is associated with phonophobia, photophobia, and nausea. He rates the HA 15/10 and can bring him to tears. The headache lasts a couple of hours. He will have headaches 4-5 days each week. He will take tylenol  or migraine medication that sometimes helps. He will take 3-4 doses per week. He was recently prescribed sumatriptan  50 mg as needed that works well. He does smoke daily.   MEDICATIONS:  Outpatient Encounter Medications as of 05/20/2024  Medication Sig   acetaminophen  (TYLENOL ) 500 MG tablet Take 500-1,000 mg by mouth 2 (two) times daily as needed (pain.).   nicotine  (NICODERM CQ  - DOSED IN MG/24 HR) 7 mg/24hr patch Place 1 patch (7 mg total) onto the skin daily.   omeprazole  (PRILOSEC) 40 MG capsule  TAKE 1 CAPSULE (40 MG TOTAL) BY MOUTH DAILY.   pregabalin  (LYRICA ) 50 MG capsule Take 1 capsule (50 mg total) by mouth daily.   SUMAtriptan  (IMITREX ) 50 MG tablet Take 1 tablet (50 mg total) by mouth once as needed for migraine. May repeat in 2 hours if headache persists or recurs.   FOLIC ACID  PO Take 1 tablet by mouth 3 (three) times a week. (Patient not taking: Reported on 05/20/2024)   No facility-administered encounter medications on file as of 05/20/2024.    PAST MEDICAL HISTORY: Past  Medical History:  Diagnosis Date   Abdominal fluid collection 07/11/2022   BPH (benign prostatic hyperplasia)    Bronchitis    GERD (gastroesophageal reflux disease)    Heart murmur    Melena 04/25/2019   Multiple gastric ulcers    Peritonitis (HCC) 06/26/2022    PAST SURGICAL HISTORY: Past Surgical History:  Procedure Laterality Date   CHOLECYSTECTOMY N/A 06/25/2022   Procedure: LAPAROSCOPIC CHOLECYSTECTOMY;  Surgeon: Lyndel Deward PARAS, MD;  Location: [facility];  Service: General;  Laterality: N/A;   COLONOSCOPY     I & D EXTREMITY  10/18/2011   Procedure: IRRIGATION AND DEBRIDEMENT EXTREMITY;  Surgeon: Elsie Mussel, MD;  Location: [facility];  Service: Orthopedics;  Laterality: Right;  Irrigation and Debridement  Right Middle Finger; Removal of foreign body   LAPAROTOMY N/A 06/27/2022   Procedure: EXPLORATORY LAPAROTOMY, WASH OUT,  ABDOMINAL CLOSURE;  Surgeon: Lyndel Deward PARAS, MD;  Location: [facility];  Service: General;  Laterality: N/A;    ALLERGIES: Allergies[1]  FAMILY HISTORY: Family History  Problem Relation Age of Onset   Healthy Mother    Hypertension Father    Colon cancer Maternal Uncle    Rectal cancer Maternal Uncle    Brain cancer Maternal Uncle    Heart disease Maternal Aunt    Esophageal cancer Neg Hx    Stomach cancer Neg Hx     SOCIAL HISTORY: Social History[2] Social History   Social History Narrative   Are you right handed or left handed? right   Are you currently employed ?    What is your current occupation? [employer]   Do you live at home alone?   Who lives with you? Family   What type of home do you live in: 1 story or 2 story? one   Caffiene 3 cups daily      OBJECTIVE: PHYSICAL EXAM: BP 120/85   Pulse 71   Ht 5' 8 (1.727 m)   Wt 128 lb (58.1 kg)   SpO2 100%   BMI 19.46 kg/m   General: General appearance: Awake and alert. No distress. Cooperative with exam.  Skin: No obvious rash or jaundice. HEENT: Atraumatic.  Anicteric. Fundoscopy: Disc margins clear, no obvious papilledema Lungs: Non-labored breathing on room air  Heart: Regular Extremities: No edema. No obvious deformity.  Psych: Affect appropriate.  Neurological: Mental Status: Alert. Speech fluent. No pseudobulbar affect Cranial Nerves: CNII: No RAPD. Visual fields grossly intact. CNIII, IV, VI: PERRL. No nystagmus. EOMI. CN V: Facial sensation intact bilaterally to fine touch. CN VII: Facial muscles symmetric and strong. No ptosis at rest. CN VIII: Hearing grossly intact bilaterally. CN IX: No hypophonia. CN X: Palate elevates symmetrically. CN XI: Full strength shoulder shrug bilaterally. CN XII: Tongue protrusion full and midline. No atrophy or fasciculations. No significant dysarthria Motor: Tone is normal No atrophy.  Individual muscle group testing (MRC grade out of 5):  Movement     Neck  flexion 5    Neck extension 5     Right Left   Shoulder abduction 5 5   Elbow flexion 5 5   Elbow extension 5 5   Finger abduction - FDI 5 5   Finger abduction - ADM 5 5   Finger extension 5 5   Finger distal flexion - 2/3 5 5    Finger distal flexion - 4/5 5 5    Thumb flexion - FPL 5 5   Thumb abduction - APB 5 5    Hip flexion 5 5   Hip extension 5 5   Hip adduction 5 5   Hip abduction 5 5   Knee extension 5 5   Knee flexion 5 5   Dorsiflexion 5- 5-   Plantarflexion 5 5   Inversion 5 5   Eversion 5 5   Great toe extension 4- 4-   Great toe flexion 4+ 4+     Reflexes:  Right Left   Bicep 2+ 2+   Tricep 2+ 2+   BrRad 2+ 2+   Knee 2+ 2+   Ankle 2+ 2+    Pathological Reflexes: Babinski: flexor response bilaterally Hoffman: absent bilaterally Troemner: absent bilaterally Sensation: Pinprick: Intact in upper extremities; diminished in lower extremities to mid calves (right more than left) Vibration: Diminished in bilateral great toes otherwise intact Proprioception: Intact in bilateral great toes Coordination:  Intact finger-to- nose-finger bilaterally. Romberg negative. Gait: Able to rise from chair with arms crossed unassisted. Normal, narrow-based gait. Able to tandem walk. Able to walk on toes and heels.  Lab and Test Review: Internal labs: 11/17/23: CBC significant for Hb 12.9, MCV 90.5 10/22/22: Lipid panel: tChol 226, LDL 154, TG 109 08/18/22: CMP unremarkable  07/01/22: Folate low at 5.3 B12: 962  06/24/22: HbA1c: 5.8 HIV non-reactive B1 low at 49.5 nmol/L TSH wnl  Imaging/Procedures: CT head wo contrast (06/24/22): FINDINGS: Brain: No evidence of acute infarction, hemorrhage, hydrocephalus, extra-axial collection or mass lesion/mass effect.   Vascular: No hyperdense vessel or unexpected calcification.   Skull: Normal. Negative for fracture or focal lesion.   Sinuses/Orbits: No acute finding.   Other: None.   IMPRESSION: No acute intracranial pathology.  EKG (06/27/23): NSR, normal QT  MRI lumbar spine wo contrast (11/19/23):    EMG (02/09/24): NCV & EMG Findings: Extensive electrodiagnostic evaluation of the right lower limb with additional nerve conduction studies of the left lower limb shows: Bilateral sural and superficial peroneal/fibular sensory responses are absent. Bilateral peroneal/fibular (EDB) motor responses are absent. Bilateral tibial (AH) motor responses show reduced amplitude (right 4.2, left 4.9 mV). Bilateral peroneal/fibular (TA) motor responses show reduced amplitude (right 3.0, left 3.4 mV). Right H reflex latency is within normal limits. Chronic motor axon loss changes without accompanying active denervation changes are seen in the right tibialis anterior, medial head of gastrocnemius, and flexor digitorum longus muscles.   Impression: This is an abnormal study. 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 No electrodiagnostic evidence of  an overlapping right or left peroneal/fibular mononeuropathy. No electrodiagnostic evidence of a right lumbosacral (L3-S1) motor radiculopathy.  ASSESSMENT: Dreshon Proffit is a 38 y.o. male who presents for evaluation of pain and weakness in legs and headaches. He has a relevant medical history of pre-DM, esophagitis, SBO, current smoker. His neurological examination is pertinent for length dependent sensory loss and mild weakness in distal lower limbs. Available diagnostic data  is significant for EMG showing evidence of an axonal polyneuropathy without radiculopathy or peroneal mononeuropathy.   Patient's pain and weakness in the legs is consistent with critical illness polyneuropathy. Fortunately his strength has greatly improved. He continues to have significant pain though. He is currently on a low dose of Lyrica  which could be increased, however, I am starting a migraine prevention medication today that could also help with neuropathic pain, and will change only one medication at a time to help monitor response and side effects. I will send labs to look for other treatable causes as vitamin deficiency is also a concern given all of his GI issues.  His headaches are consistent with migraine without aura. He is having 4-5 headaches per week, so a preventative medication is needed. Nortriptyline  is a good option as it can prevent migraines and has been used for neuropathic pain as well. His headaches do respond to sumatriptan .  PLAN: -Blood work: B1, B6, B12, folate, MMA, IFE, HbA1c -Continue Lyrica  50 mg daily for now. May go up in future for neuropathic pain -Lidocaine  cream PRN -Alpha lipoic acid 600 mg once or twice daily  For migraines: Migraine prevention:  Start nortriptyline  10 mg at bedtime for 1 week, then increase to 20 mg at bedtime  Migraine rescue:  Continue Sumatriptan  50 mg as needed at headache onset, can repeat in 2 hours if needed Limit use of pain relievers to no  more than 2 days out of week to prevent risk of rebound or medication-overuse headache. Keep headache diary Discussed smoking cessation  -Return to clinic in 3 months  The impression above as well as the plan as outlined below were extensively discussed with the patient (in the company of wife) who voiced understanding. All questions were answered to their satisfaction.  When available, results of the above investigations and possible further recommendations will be communicated to the patient via telephone/MyChart. Patient to call office if not contacted after expected testing turnaround time.   Total time spent reviewing records, interview, history/exam, documentation, and coordination of care on day of encounter:  75 min   Thank you for allowing me to participate in patient's care.  If I can answer any additional questions, I would be pleased to do so.  Venetia Potters, MD   CC: Napoleon Limes, MD [address] [place] [address] [place] [address]  CC: Referring provider: Napoleon Limes, MD [address] [place],  [place] [address]     [1]  Allergies Allergen Reactions   Aspirin Other (See Comments)    Cannot take due to stomach ulcers  [2]  Social History Tobacco Use   Smoking status: Every Day    Current packs/day: 1.00    Types: Cigarettes   Smokeless tobacco: Never   Tobacco comments:    Less than 1/2 pack  Vaping Use   Vaping status: Never Used  Substance Use Topics   Alcohol use: Never   Drug use: Yes    Frequency: 3.0 times per week    Types: Marijuana   "

## 2024-05-19 NOTE — Progress Notes (Signed)
 Internal Medicine Clinic Attending  I was physically present during the key portions of the resident provided service and participated in the medical decision making of patient's management care. I reviewed pertinent patient test results.  The assessment, diagnosis, and plan were formulated together and I agree with the documentation in the resident's note.  Jeanelle Layman CROME, MD

## 2024-05-20 ENCOUNTER — Other Ambulatory Visit

## 2024-05-20 ENCOUNTER — Ambulatory Visit (INDEPENDENT_AMBULATORY_CARE_PROVIDER_SITE_OTHER): Admitting: Neurology

## 2024-05-20 ENCOUNTER — Encounter: Payer: Self-pay | Admitting: Neurology

## 2024-05-20 VITALS — BP 120/85 | HR 71 | Ht 68.0 in | Wt 128.0 lb

## 2024-05-20 DIAGNOSIS — G43001 Migraine without aura, not intractable, with status migrainosus: Secondary | ICD-10-CM

## 2024-05-20 DIAGNOSIS — R7303 Prediabetes: Secondary | ICD-10-CM | POA: Diagnosis not present

## 2024-05-20 DIAGNOSIS — G6281 Critical illness polyneuropathy: Secondary | ICD-10-CM | POA: Diagnosis not present

## 2024-05-20 MED ORDER — NORTRIPTYLINE HCL 10 MG PO CAPS: 20.0000 mg | ORAL_CAPSULE | Freq: Every day | ORAL | 5 refills | Status: AC

## 2024-05-20 MED ORDER — SUMATRIPTAN SUCCINATE 50 MG PO TABS: 50.0000 mg | ORAL_TABLET | Freq: Once | ORAL | 5 refills | Status: AC | PRN

## 2024-05-20 NOTE — Patient Instructions (Addendum)
 I saw you today for pain and weakness in your legs and headaches.  Your symptoms in the legs are due to nerve damage from long hospitalization, called critical illness polyneuropathy.  Your headaches are most likely migraines.  I want to do blood work today to look for other treatable causes of nerve damage. I will be in touch when I have those results.  Continue Lyrica  50 mg daily for now since we are starting another medication, but I will likely increase this in the future.  You can also try Lidocaine  cream as needed. Apply wear you have pain, tingling, or burning. Wear gloves to prevent your hands being numb. This can be bought over the counter at any drug store or online.  Alpha lipoic acid 600mg  daily has some research data suggesting it helps with nerve health. No major side effects other than <1% of people report upset stomach. This can be taken twice per day (1200mg  daily) if no relief obtained. You can buy this over the counter or online.  For your migraines: Migraine prevention:  Start nortriptyline  10 mg at bedtime for 1 week, then increase to 20 mg at bedtime  Migraine rescue:  Continue Sumatriptan  50 mg as needed at headache onset, can repeat in 2 hours if needed Limit use of pain relievers to no more than 2 days out of week to prevent risk of rebound or medication-overuse headache. Keep headache diary Discussed smoking cessation  I want to hear from you in 1 month to tell me how you are doing in terms of headaches and leg pain. We may make adjustments to medications at that time.  I will see you again in clinic in hopefully around 3 months. Please let me know if you have any questions or concerns in the meantime.   The physicians and staff at [facility] are committed to providing excellent care. You may receive a survey requesting feedback about your experience at our office. We strive to receive very good responses to the survey questions. If you feel that your  experience would prevent you from giving the office a very good  response, please contact our office to try to remedy the situation. We may be reached at [phone]. Thank you for taking the time out of your busy day to complete the survey.  Venetia Potters, MD [facility]  More migraine information: Be aware of common food triggers:  - Caffeine:  coffee, black tea, cola, Mt. Dew  - Chocolate  - Dairy:  aged cheeses (brie, blue, cheddar, gouda, Parmasan, provolone, romano, Swiss, etc), chocolate milk, buttermilk, sour cream, limit eggs and yogurt  - Nuts, peanut butter  - Alcohol  - Cereals/grains:  FRESH breads (fresh bagels, sourdough, doughnuts), yeast productions  - Processed/canned/aged/cured meats (pre-packaged deli meats, hotdogs)  - MSG/glutamate:  soy sauce, flavor enhancer, pickled/preserved/marinated foods  - Sweeteners:  aspartame (Equal, Nutrasweet).  Sugar and Splenda are okay  - Vegetables:  legumes (lima beans, lentils, snow peas, fava beans, pinto peans, peas, garbanzo beans), sauerkraut, onions, olives, pickles  - Fruit:  avocados, bananas, citrus fruit (orange, lemon, grapefruit), mango  - Other:  Frozen meals, macaroni and cheese Routine exercise Stay adequately hydrated (aim for 64 oz water  daily) Keep headache diary Maintain proper stress management Maintain proper sleep hygiene Do not skip meals Consider supplements:  magnesium  citrate 400mg  daily, riboflavin 400mg  daily, coenzyme Q10 100mg  three times daily.

## 2024-05-24 ENCOUNTER — Ambulatory Visit: Payer: Self-pay | Admitting: Neurology

## 2024-05-28 LAB — VITAMIN B1: Vitamin B1 (Thiamine): <6 nmol/L — ABNORMAL LOW (ref 8–30)

## 2024-05-28 LAB — FOLATE: Folate: 7.6 ng/mL

## 2024-05-28 LAB — IMMUNOFIXATION ELECTROPHORESIS
IgG (Immunoglobin G), Serum: 1001 mg/dL (ref 600–1640)
IgM, Serum: 93 mg/dL (ref 50–300)
Immunoglobulin A: 309 mg/dL (ref 47–310)

## 2024-05-28 LAB — HEMOGLOBIN A1C
Hgb A1c MFr Bld: 5.3 %
Mean Plasma Glucose: 105 mg/dL
eAG (mmol/L): 5.8 mmol/L

## 2024-05-28 LAB — VITAMIN B6: Vitamin B6: 27.3 ng/mL — ABNORMAL HIGH (ref 2.1–21.7)

## 2024-05-28 LAB — METHYLMALONIC ACID, SERUM: Methylmalonic Acid, Quant: 89 nmol/L (ref 55–335)

## 2024-05-28 LAB — VITAMIN B12: Vitamin B-12: 864 pg/mL (ref 200–1100)

## 2024-06-02 ENCOUNTER — Other Ambulatory Visit: Payer: Self-pay

## 2024-06-02 ENCOUNTER — Ambulatory Visit: Payer: Self-pay | Admitting: Student

## 2024-06-02 ENCOUNTER — Encounter: Payer: Self-pay | Admitting: Student

## 2024-06-02 VITALS — BP 112/63 | HR 78 | Temp 98.3°F | Ht 68.0 in | Wt 130.4 lb

## 2024-06-02 DIAGNOSIS — Z0000 Encounter for general adult medical examination without abnormal findings: Secondary | ICD-10-CM

## 2024-06-02 DIAGNOSIS — E519 Thiamine deficiency, unspecified: Secondary | ICD-10-CM | POA: Diagnosis not present

## 2024-06-02 DIAGNOSIS — Z5320 Procedure and treatment not carried out because of patient's decision for unspecified reasons: Secondary | ICD-10-CM | POA: Diagnosis not present

## 2024-06-02 DIAGNOSIS — Z720 Tobacco use: Secondary | ICD-10-CM | POA: Diagnosis not present

## 2024-06-02 DIAGNOSIS — Z2821 Immunization not carried out because of patient refusal: Secondary | ICD-10-CM | POA: Diagnosis not present

## 2024-06-02 DIAGNOSIS — Z23 Encounter for immunization: Secondary | ICD-10-CM

## 2024-06-02 DIAGNOSIS — K209 Esophagitis, unspecified without bleeding: Secondary | ICD-10-CM

## 2024-06-02 DIAGNOSIS — G43709 Chronic migraine without aura, not intractable, without status migrainosus: Secondary | ICD-10-CM

## 2024-06-02 MED ORDER — THIAMINE HCL 100 MG PO TABS: 100.0000 mg | ORAL_TABLET | Freq: Every day | ORAL | 3 refills | Status: AC

## 2024-06-02 NOTE — Patient Instructions (Addendum)
 Gabriel Dalton, Thank you for allowing me to take part in your care today.  Here are your instructions.  1.  You are doing well.  I sent the thiamine  to your pharmacy.  2.  Please come back in 6 months for chronic condition follow-up.  3.  At that time we can check your vitamin levels.  4.  Regarding your headaches, continue taking your medicines as prescribed.  5.  If you have any concerns please come in earlier.   PLEASE BRING YOUR MEDICATIONS TO EVERY APPOINTMENT  Thank you, Dr. Tobie  If you have any other questions please contact the internal medicine clinic at [phone] If it is after hours, please call the [place] [facility] at [phone] and then ask the person who picks up for the resident on call.

## 2024-06-02 NOTE — Assessment & Plan Note (Addendum)
 Patient with past medical history of chronic migraines.  Recently seen by neurology on 05/20/2024.  He was started on nortriptyline  and increased to 20 mg nightly.  Patient also on sumatriptan  50 mg as needed.  He was instructed to keep a headache diary. He has had 2 headaches since the visit. One yesterday and one earlier in the week. Sleeping well.   Plan: - Continue nortriptyline  20 mg nightly - Continues sumatriptan  50 mg as needed - Patient encouraged to continue to follow with neurology

## 2024-06-02 NOTE — Assessment & Plan Note (Addendum)
 Due to financial reasons patient has not yet made an appointment for this.  Has no concerns about this.  Not throwing up blood, not nauseous.  Abdominal pain has improved.  He states once his financial situation has improved he will make his appointment with Dr. Dianna.

## 2024-06-02 NOTE — Assessment & Plan Note (Addendum)
 Patient recently had thiamine  levels which were low. B1 less than 6 on 05/20/2024  Plan: - Refill thiamine  100 mg daily

## 2024-06-02 NOTE — Progress Notes (Signed)
 "  CC: Preventative health exam   HPI:  Gabriel Dalton is a 39 y.o. male with past medical history of migraines, history of small bowel perforation, BPH, tobacco use disorder who presents for follow-up appointment.  Please see assessment and plan for full HPI.  Medications: Migraines: Nortriptyline  20 mg nightly, sumatriptan  50 mg as needed, Tylenol  500 mg twice daily as needed Tobacco use: Nicotine  patch 7 mg daily GERD: Omeprazole  40 mg daily Neuropathy: Pregabalin  50 mg daily Thiamine  deficiency: Thiamine  100 mg daily  Past Medical History:  Diagnosis Date   Abdominal fluid collection 07/11/2022   BPH (benign prostatic hyperplasia)    Bronchitis    GERD (gastroesophageal reflux disease)    Heart murmur    Melena 04/25/2019   Multiple gastric ulcers    Peritonitis (HCC) 06/26/2022    Current Medications[1]  Review of Systems:    Negative except for what is stated in HPI.   Physical Exam:  Vitals:   06/02/24 1519  BP: 112/63  Pulse: 78  Temp: 98.3 F (36.8 C)  TempSrc: Oral  SpO2: 100%  Weight: 130 lb 6.4 oz (59.1 kg)  Height: 5' 8 (1.727 m)    General: Patient is sitting comfortably in the room  Head: Normocephalic, atraumatic  Cardio: Regular rate and rhythm, no murmurs, rubs or gallops Pulmonary: Clear to ausculation bilaterally with no rales, rhonchi, and crackles  Abdomen: Soft, nontender with normoactive bowel sounds with no rebound or guarding    Assessment & Plan:   Assessment & Plan Wellness examination Patient presents for wellness exam.  He has no concerns today.  He is feeling well.  He declines vaccines today.  He declines hepatitis C screening today.  Abdominal exam negative.  Heart and lung sound well.  No acute concerns. Chronic migraine without aura without status migrainosus, not intractable Patient with past medical history of chronic migraines.  Recently seen by neurology on 05/20/2024.  He was started on nortriptyline   and increased to 20 mg nightly.  Patient also on sumatriptan  50 mg as needed.  He was instructed to keep a headache diary. He has had 2 headaches since the visit. One yesterday and one earlier in the week. Sleeping well.   Plan: - Continue nortriptyline  20 mg nightly - Continues sumatriptan  50 mg as needed - Patient encouraged to continue to follow with neurology Tobacco use Discussed smoking cessation.  Patient is currently smoking 3 cigarettes a day.  He is doing the nicotine  patches.  He states that going well.  They carve his cravings.  He states he will soon stop all cigarettes.   Plan: - 4 minutes spent on tobacco cessation counseling - Continue nicotine  patches Thiamine  deficiency Patient recently had thiamine  levels which were low. B1 less than 6 on 05/20/2024  Plan: - Refill thiamine  100 mg daily Esophagitis determined by endoscopy Due to financial reasons patient has not yet made an appointment for this.  Has no concerns about this.  Not throwing up blood, not nauseous.  Abdominal pain has improved.  He states once his financial situation has improved he will make his appointment with Dr. Dianna. Screening for hepatitis C declined Patient declined hepatitis C screening. Flu vaccine refused Patient declined flu vaccine. Need for diphtheria-tetanus-pertussis (Tdap) vaccine Patient declined Tdap vaccine today. Vaccine for streptococcus pneumoniae and influenza Patient declined pneumonia vaccine.   Patient discussed with Dr. Rosan Libby Blanch, DO Internal Medicine Resident PGY-3     [1]  Current Outpatient Medications:  thiamine  (VITAMIN B1) 100 MG tablet, Take 1 tablet (100 mg total) by mouth daily., Disp: 90 tablet, Rfl: 3   acetaminophen  (TYLENOL ) 500 MG tablet, Take 500-1,000 mg by mouth 2 (two) times daily as needed (pain.)., Disp: , Rfl:    FOLIC ACID  PO, Take 1 tablet by mouth 3 (three) times a week. (Patient not taking: Reported on 05/20/2024), Disp: , Rfl:     nicotine  (NICODERM CQ  - DOSED IN MG/24 HR) 7 mg/24hr patch, Place 1 patch (7 mg total) onto the skin daily., Disp: 30 patch, Rfl: 11   nortriptyline  (PAMELOR ) 10 MG capsule, Take 2 capsules (20 mg total) by mouth at bedtime. Take 1 capsule (10 mg) at bedtime for 1 week, then increase to 2 capsules (20 mg) at bedtime thereafter, Disp: 60 capsule, Rfl: 5   omeprazole  (PRILOSEC) 40 MG capsule, TAKE 1 CAPSULE (40 MG TOTAL) BY MOUTH DAILY., Disp: 90 capsule, Rfl: 0   pregabalin  (LYRICA ) 50 MG capsule, Take 1 capsule (50 mg total) by mouth daily., Disp: 30 capsule, Rfl: 3   SUMAtriptan  (IMITREX ) 50 MG tablet, Take 1 tablet (50 mg total) by mouth once as needed for migraine. May repeat in 2 hours if headache persists or recurs., Disp: 30 tablet, Rfl: 5  "

## 2024-06-02 NOTE — Assessment & Plan Note (Addendum)
 Discussed smoking cessation.  Patient is currently smoking 3 cigarettes a day.  He is doing the nicotine  patches.  He states that going well.  They carve his cravings.  He states he will soon stop all cigarettes.   Plan: - 4 minutes spent on tobacco cessation counseling - Continue nicotine  patches

## 2024-06-08 ENCOUNTER — Emergency Department (HOSPITAL_COMMUNITY)

## 2024-06-08 ENCOUNTER — Emergency Department (HOSPITAL_COMMUNITY)
Admission: EM | Admit: 2024-06-08 | Discharge: 2024-06-08 | Disposition: A | Discharge status: Home / Self Care | Attending: Emergency Medicine | Admitting: Emergency Medicine

## 2024-06-08 ENCOUNTER — Other Ambulatory Visit: Payer: Self-pay

## 2024-06-08 ENCOUNTER — Encounter (HOSPITAL_COMMUNITY): Payer: Self-pay

## 2024-06-08 DIAGNOSIS — K644 Residual hemorrhoidal skin tags: Secondary | ICD-10-CM | POA: Insufficient documentation

## 2024-06-08 DIAGNOSIS — E871 Hypo-osmolality and hyponatremia: Secondary | ICD-10-CM | POA: Insufficient documentation

## 2024-06-08 DIAGNOSIS — R197 Diarrhea, unspecified: Secondary | ICD-10-CM | POA: Diagnosis present

## 2024-06-08 DIAGNOSIS — R1030 Lower abdominal pain, unspecified: Secondary | ICD-10-CM

## 2024-06-08 DIAGNOSIS — R1031 Right lower quadrant pain: Secondary | ICD-10-CM | POA: Insufficient documentation

## 2024-06-08 LAB — URINALYSIS, ROUTINE W REFLEX MICROSCOPIC
Bacteria, UA: NONE SEEN
Bilirubin Urine: NEGATIVE
Glucose, UA: NEGATIVE mg/dL
Hgb urine dipstick: NEGATIVE
Ketones, ur: NEGATIVE mg/dL
Leukocytes,Ua: NEGATIVE
Nitrite: NEGATIVE
Protein, ur: >=300 mg/dL — AB
Specific Gravity, Urine: 1.029 (ref 1.005–1.030)
pH: 5 (ref 5.0–8.0)

## 2024-06-08 LAB — CBC WITH DIFFERENTIAL/PLATELET
Abs Immature Granulocytes: 0.01 10*3/uL (ref 0.00–0.07)
Basophils Absolute: 0 10*3/uL (ref 0.0–0.1)
Basophils Relative: 0 %
Eosinophils Absolute: 0 10*3/uL (ref 0.0–0.5)
Eosinophils Relative: 0 %
HCT: 45.3 % (ref 39.0–52.0)
Hemoglobin: 15.6 g/dL (ref 13.0–17.0)
Immature Granulocytes: 0 %
Lymphocytes Relative: 14 %
Lymphs Abs: 1.1 10*3/uL (ref 0.7–4.0)
MCH: 30.6 pg (ref 26.0–34.0)
MCHC: 34.4 g/dL (ref 30.0–36.0)
MCV: 89 fL (ref 80.0–100.0)
Monocytes Absolute: 0.7 10*3/uL (ref 0.1–1.0)
Monocytes Relative: 8 %
Neutro Abs: 6.4 10*3/uL (ref 1.7–7.7)
Neutrophils Relative %: 78 %
Platelets: 237 10*3/uL (ref 150–400)
RBC: 5.09 MIL/uL (ref 4.22–5.81)
RDW: 14.5 % (ref 11.5–15.5)
WBC: 8.2 10*3/uL (ref 4.0–10.5)
nRBC: 0 % (ref 0.0–0.2)

## 2024-06-08 LAB — COMPREHENSIVE METABOLIC PANEL WITH GFR
ALT: 33 U/L (ref 0–44)
AST: 42 U/L — ABNORMAL HIGH (ref 15–41)
Albumin: 4.5 g/dL (ref 3.5–5.0)
Alkaline Phosphatase: 99 U/L (ref 38–126)
Anion gap: 13 (ref 5–15)
BUN: 17 mg/dL (ref 6–20)
CO2: 22 mmol/L (ref 22–32)
Calcium: 9.2 mg/dL (ref 8.9–10.3)
Chloride: 98 mmol/L (ref 98–111)
Creatinine, Ser: 0.92 mg/dL (ref 0.61–1.24)
GFR, Estimated: >60 mL/min
Glucose, Bld: 86 mg/dL (ref 70–99)
Potassium: 4.2 mmol/L (ref 3.5–5.1)
Sodium: 133 mmol/L — ABNORMAL LOW (ref 135–145)
Total Bilirubin: 0.4 mg/dL (ref 0.0–1.2)
Total Protein: 8 g/dL (ref 6.5–8.1)

## 2024-06-08 LAB — LIPASE, BLOOD: Lipase: 48 U/L (ref 11–51)

## 2024-06-08 MED ORDER — OXYCODONE HCL 5 MG PO TABS: 5.0000 mg | ORAL_TABLET | Freq: Four times a day (QID) | ORAL | 0 refills | Status: AC | PRN

## 2024-06-08 MED ORDER — SODIUM CHLORIDE 0.9 % IV BOLUS
1000.0000 mL | Freq: Once | INTRAVENOUS | Status: AC
  Administered 2024-06-08: 1000 mL via INTRAVENOUS

## 2024-06-08 MED ORDER — IOHEXOL 300 MG/ML  SOLN
100.0000 mL | Freq: Once | INTRAMUSCULAR | Status: AC | PRN
  Administered 2024-06-08: 100 mL via INTRAVENOUS

## 2024-06-08 MED ORDER — CAPSAICIN 0.025 % EX LOTN: 1.0000 mL | TOPICAL_LOTION | Freq: Every day | CUTANEOUS | 0 refills | Status: AC

## 2024-06-08 MED ORDER — PROCTOFOAM HC 1-1 % EX FOAM: 1.0000 | Freq: Two times a day (BID) | CUTANEOUS | 0 refills | Status: DC

## 2024-06-08 MED ORDER — MORPHINE SULFATE (PF) 4 MG/ML IV SOLN
4.0000 mg | Freq: Once | INTRAVENOUS | Status: AC
  Administered 2024-06-08: 4 mg via INTRAVENOUS
  Filled 2024-06-08: qty 1

## 2024-06-08 MED ORDER — ONDANSETRON HCL 4 MG/2ML IJ SOLN
4.0000 mg | Freq: Once | INTRAMUSCULAR | Status: AC
  Administered 2024-06-08: 4 mg via INTRAVENOUS
  Filled 2024-06-08: qty 2

## 2024-06-08 NOTE — ED Notes (Signed)
 Pt given gown and instructed to undress waist down for exam

## 2024-06-08 NOTE — Discharge Instructions (Signed)
 You are seen today for bloody diarrhea and abdominal pain.  Your CT showed that there were no structural causes of your bloody diarrhea.  It is likely that this is a viral or bacterial illness causing your bloody diarrhea.  We do not prescribe antibiotics for bloody diarrhea as this can worsen some infections.  Your body should clear the infection on its own.  Please continue to try and hydrate yourself is much as possible and eat and drink as you are comfortable.  Please try to manage her symptoms with Tylenol .  I have also prescribed oxycodone  5 mg that you can take for breakthrough pain every 6 hours.  Possible that some of your bleeding is also from your hemorrhoids.  I have prescribed some Proctofoam  for you but if this is too expensive you can get a Anusol  suppository over-the-counter at most pharmacies.  For your acute leg pain, I prescribed capsaicin  which she can try to see if this helps.  If it does not help you can stop using it.  If your blood in your diarrhea does not go away in the next 5 to 7 days, or if the blood gets worse, please return to the ED.  If you start to have chest pain or shortness of breath, please return to the ED.  Please follow-up with your primary care doctor and gastroenterologist in the future.

## 2024-06-08 NOTE — ED Notes (Signed)
 Assisted pt to bathroom to obtain stool sample; pt wasn't able to provide sample at this time.

## 2024-06-08 NOTE — ED Triage Notes (Signed)
 Pt reports with mid abdominal pain and bloody diarrhea x 3 days. Pt reports having abdominal issues previously.

## 2024-06-08 NOTE — ED Notes (Signed)
 Patient transported to CT

## 2024-06-08 NOTE — ED Provider Notes (Signed)
 " [place] EMERGENCY DEPARTMENT AT [facility] Provider Note   CSN: [id number] Arrival date & time: 06/08/24  0630     Patient presents with: Abdominal Pain   Gabriel Dalton is a 39 y.o. male who presents with 3 days of abdominal pain and bloody diarrhea.  He had a preceding upper respiratory infection with some cough, congestion and sore throat.  His significant other is also sick.  His abdominal pain is located in the lower quadrants.  It has been worsening over time and is now 10 out of 10.  He originally had constipation and was straining to defecate but then began to have blood in his stool along with crampy abdominal pain.  His feces then transitioned to diarrhea mixed with blood.  He notes that they are mucousy as well.  He does have a history of laparoscopic abdominal surgery for suspected gallstone disease that was eventually converted to a open procedure due to feculent peritonitis.  He was found to have 2 small bowel perforations and underwent resection of the small bowel proximal and distal to the perforations.  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  He has had an upper abdominal pain on and off since then and was reportedly supposed to follow-up with general surgery but has not been able to do this due to financial constraints. He also states that his neuropathy has worsened since the start of his bloody diarrhea.    Abdominal Pain      Prior to Admission medications  Medication Sig Start Date End Date Taking? Authorizing Provider  Capsaicin  0.025 % LOTN Apply 1 mL topically daily. 06/08/24  Yes Dalasia Predmore, Melvenia, MD  hydrocortisone -pramoxine (PROCTOFOAM  HC) rectal foam Place 1 applicator rectally 2 (two) times daily. 06/08/24  Yes Juliani Laduke, Melvenia, MD  oxyCODONE  (ROXICODONE ) 5 MG immediate release tablet Take 1 tablet (5 mg total) by mouth every 6 (six) hours as needed for up to 5 days for severe pain (pain score  7-10). 06/08/24 06/13/24 Yes Kristl Morioka, Melvenia, MD  acetaminophen  (TYLENOL ) 500 MG tablet Take 500-1,000 mg by mouth 2 (two) times daily as needed (pain.).    [provider]  FOLIC ACID  PO Take 1 tablet by mouth 3 (three) times a week. Patient not taking: Reported on 05/20/2024    [provider]  nicotine  (NICODERM CQ  - DOSED IN MG/24 HR) 7 mg/24hr patch Place 1 patch (7 mg total) onto the skin daily. 04/28/24 04/28/25  Azaela Caracci, Melvenia, MD  nortriptyline  (PAMELOR ) 10 MG capsule Take 2 capsules (20 mg total) by mouth at bedtime. Take 1 capsule (10 mg) at bedtime for 1 week, then increase to 2 capsules (20 mg) at bedtime thereafter 05/20/24   Leigh Venetia CROME, MD  omeprazole  (PRILOSEC) 40 MG capsule TAKE 1 CAPSULE (40 MG TOTAL) BY MOUTH DAILY. 04/29/24   Emerie Vanderkolk, Melvenia, MD  pregabalin  (LYRICA ) 50 MG capsule Take 1 capsule (50 mg total) by mouth daily. 04/28/24 04/28/25  Tobie Gaines, DO  SUMAtriptan  (IMITREX ) 50 MG tablet Take 1 tablet (50 mg total) by mouth once as needed for migraine. May repeat in 2 hours if headache persists or recurs. 05/20/24   Leigh Venetia CROME, MD  thiamine  (VITAMIN B1) 100 MG tablet Take 1 tablet (100 mg total) by mouth daily. 06/02/24   Tobie Gaines, DO    Allergies: Aspirin    Review of Systems  Gastrointestinal:  Positive for abdominal pain.    Updated Vital Signs BP 125/89  Pulse 84   Temp 98.2 F (36.8 C)   Resp 18   SpO2 100%   Physical Exam Constitutional:      General: He is in acute distress.     Appearance: He is ill-appearing.  Cardiovascular:     Rate and Rhythm: Regular rhythm. Tachycardia present.  Pulmonary:     Effort: Pulmonary effort is normal. No respiratory distress.     Breath sounds: No stridor. No wheezing or rales.  Abdominal:     General: There is no distension.     Tenderness: There is abdominal tenderness in the right lower quadrant and left lower quadrant. There is rebound.     Hernia: A hernia is present.   Genitourinary:    Comments: External hemorrhoid visualized Neurological:     Mental Status: He is alert.     (all labs ordered are listed, but only abnormal results are displayed) Labs Reviewed  COMPREHENSIVE METABOLIC PANEL WITH GFR - Abnormal; Notable for the following components:      Result Value   Sodium 133 (*)    AST 42 (*)    All other components within normal limits  URINALYSIS, ROUTINE W REFLEX MICROSCOPIC - Abnormal; Notable for the following components:   Protein, ur >=300 (*)    All other components within normal limits  GASTROINTESTINAL PANEL BY PCR, STOOL (REPLACES STOOL CULTURE)  LIPASE, BLOOD  CBC WITH DIFFERENTIAL/PLATELET  POC OCCULT BLOOD, ED    EKG: None  Radiology: CT ABDOMEN PELVIS W CONTRAST Result Date: 06/08/2024 CLINICAL DATA:  Right lower quadrant abdominal pain. History of gastric ulcer and small bowel perforation. EXAM: CT ABDOMEN AND PELVIS WITH CONTRAST TECHNIQUE: Multidetector CT imaging of the abdomen and pelvis was performed using the standard protocol following bolus administration of intravenous contrast. RADIATION DOSE REDUCTION: This exam was performed according to the departmental dose-optimization program which includes automated exposure control, adjustment of the mA and/or kV according to patient size and/or use of iterative reconstruction technique. CONTRAST:  OMNIPAQUE  IOHEXOL  300 MG/ML  SOLN COMPARISON:  Prior CT scan of the abdomen and pelvis 08/05/2023 FINDINGS: Lower chest: No acute abnormality. Hepatobiliary: No focal liver abnormality is seen. No gallstones, gallbladder wall thickening, or biliary dilatation. Pancreas: Unremarkable. No pancreatic ductal dilatation or surrounding inflammatory changes. Spleen: Normal in size without focal abnormality. Adrenals/Urinary Tract: Normal adrenal glands. No hydronephrosis, nephrolithiasis or enhancing renal mass. Tiny circumscribed water  attenuation cystic lesions are considered benign. No  further follow-up imaging is recommended. Stomach/Bowel: No evidence of focal bowel wall thickening or evidence of obstruction. Surgical changes consistent with prior bowel resection and anastomosis. Vascular/Lymphatic: Scattered atherosclerotic calcifications without stenosis or occlusion. No aneurysm or dissection. No suspicious lymphadenopathy. Reproductive: Prostate is unremarkable. Other: No abdominal wall hernia or abnormality. No abdominopelvic ascites. Musculoskeletal: No acute or significant osseous findings. IMPRESSION: 1. No acute abnormality within the abdomen or pelvis. Specifically, no evidence of bowel obstruction, free air or free fluid. 2. Aortic atherosclerotic vascular calcifications. Aortic Atherosclerosis (ICD10-I70.0). 3. Evidence of prior small bowel resection and anastomosis without complication. Electronically Signed   By: Wilkie Lent M.D.   On: 06/08/2024 10:53     Procedures   Medications Ordered in the ED  morphine  (PF) 4 MG/ML injection 4 mg (4 mg Intravenous Given 06/08/24 0749)  sodium chloride  0.9 % bolus 1,000 mL (0 mLs Intravenous Stopped 06/08/24 0905)  ondansetron  (ZOFRAN ) injection 4 mg (4 mg Intravenous Given 06/08/24 0752)  iohexol  (OMNIPAQUE ) 300 MG/ML solution 100 mL (100 mLs Intravenous  Contrast Given 06/08/24 0905)                                    Medical Decision Making Patient presents today with chief concern of bloody diarrhea.  The diarrhea started in conjunction with an upper respiratory infection that both he and his significant other have had.  He also notes worsening of his neuropathy during this time which is consistent with a viral etiology as well.  CT did not show any structural abnormalities to cause his bloody diarrhea.  He does have known hemorrhoids and reported constipation prior to the blood in his stool along with blood on the toilet paper.  Differential also could include Crohn's or ulcerative colitis but these are less likely given  the lack of colitis seen on imaging.  His labs are unremarkable other than a mildly elevated AST and mild hyponatremia.  Of note he did not have an elevated BUN to suggest a large-volume GI bleed.  His hemoglobin was within normal limits and higher than his baseline.  He was tachycardic on admission and was given a liter bolus with improvement of his tachycardia.  He received morphine  in the ED with some improvement of his pain.  GI PCR was pending at time of discharge.  His bleeding should resolve in the next 5 to 7 days if it is infectious diarrhea.  We will defer antibiotics in this patient as he is having bloody diarrhea.  Advised to treat his symptoms with fluids and over-the-counter Tylenol .  I also sent him in Roxicodone  for pain control as he was very uncomfortable.  Advised him to follow-up with his PCP and his gastroenterologist.  He has had difficulty following up with his gastroenterologist in the past due to financial difficulties but states that he will try to at this time.  Regarding his neuropathy, the family wanted to try capsaicin  cream so I have sent a prescription for this.  I sent in some Proctofoam  for his hemorrhoid which was seen on physical exam.  Instructed the patient that it is too expensive that they can use Anusol  suppository instead.  Amount and/or Complexity of Data Reviewed Labs: ordered.    Details: GI PCR panel pending CMP with mild hyponatremia and mildly elevated AST. CBC within normal limits, no leukocytosis  Radiology: ordered.    Details: CT scan did not demonstrate any structural cause of his pain.  Risk OTC drugs. Prescription drug management.        Final diagnoses:  Bloody diarrhea  Lower abdominal pain    ED Discharge Orders          Ordered    oxyCODONE  (ROXICODONE ) 5 MG immediate release tablet  Every 6 hours PRN        06/08/24 1117    hydrocortisone -pramoxine (PROCTOFOAM  HC) rectal foam  2 times daily        06/08/24 1117     Capsaicin  0.025 % LOTN  Daily        06/08/24 1117               Yoniel Arkwright, Melvenia, MD 06/08/24 1134    Ruthe Cornet, DO 06/08/24 1152  "

## 2024-06-08 NOTE — ED Provider Notes (Signed)
 Supervised resident visit.  Patient with history of gastric ulcer small bowel perforation.  He has been having abdominal pain with what started as constipation and took some MiraLAX  and having some diarrhea.  He is noticed blood on the toilet paper sometimes mixed into the stool.  Denies any dark tarry stools.  He has not had any major vomiting.  Denies any fevers or chills.  He does have somewhat chronic abdominal pain since abdominal surgery has had in the past.  Differential includes colitis versus peritonitis/bowel perforation.  Do suspect there is blood secondary to hemorrhoid.  Does not have melena.  Will check labs CT scan.  Vital signs are unremarkable.  Will give IV fluids IV narcotics and reevaluate.  Please see my resident note for further results evaluation and disposition of the patient.  This chart was dictated using voice recognition software.  Despite best efforts to proofread,  errors can occur which can change the documentation meaning.    Ruthe Cornet, DO 06/08/24 [phone]

## 2024-06-09 ENCOUNTER — Telehealth: Payer: Self-pay | Admitting: Gastroenterology

## 2024-06-09 LAB — GASTROINTESTINAL PANEL BY PCR, STOOL (REPLACES STOOL CULTURE)

## 2024-06-09 NOTE — Telephone Encounter (Signed)
 Patient seen in the ER for the same issues.  Called the patient back. No answer. Left message advising there are no sooner appointments. Encourage to consider returning to the ER if he is worsening or contact PCP. Wait listed the patient.

## 2024-06-09 NOTE — Telephone Encounter (Signed)
 Incoming call from pts spouse regarding scheduling appt. Spouse stated pt is experiencing blood in stool. Spouse requesting sooner appointment. Pt scheduled for 06/15/2024.  Please advise. Thank you.

## 2024-06-11 NOTE — Progress Notes (Signed)
 Internal Medicine Clinic Attending  Case discussed with the resident at the time of the visit.  We reviewed the resident's history and exam and pertinent patient test results.  I agree with the assessment, diagnosis, and plan of care documented in the resident's note.

## 2024-06-15 ENCOUNTER — Ambulatory Visit: Admitting: Physician Assistant

## 2024-06-16 ENCOUNTER — Telehealth: Payer: Self-pay | Admitting: Neurology

## 2024-06-16 NOTE — Telephone Encounter (Signed)
 Pt's spouse called in this afternoon. Pt is having shooting pains in his right foot. Please call. Thanks

## 2024-06-17 ENCOUNTER — Other Ambulatory Visit: Payer: Self-pay

## 2024-06-17 DIAGNOSIS — G6281 Critical illness polyneuropathy: Secondary | ICD-10-CM

## 2024-06-17 NOTE — Telephone Encounter (Signed)
 Order sent to [facility].

## 2024-09-01 ENCOUNTER — Ambulatory Visit: Payer: Self-pay | Admitting: Neurology
